# Patient Record
Sex: Female | Born: 1975 | Race: Black or African American | Hispanic: No | Smoking: Never smoker
Health system: Southern US, Community
[De-identification: ages and names within clinical notes are randomized; demographics above are authoritative.]

## PROBLEM LIST (undated history)

## (undated) DIAGNOSIS — F1010 Alcohol abuse, uncomplicated: Secondary | ICD-10-CM

## (undated) DIAGNOSIS — F419 Anxiety disorder, unspecified: Secondary | ICD-10-CM

## (undated) DIAGNOSIS — J45909 Unspecified asthma, uncomplicated: Secondary | ICD-10-CM

## (undated) DIAGNOSIS — K219 Gastro-esophageal reflux disease without esophagitis: Secondary | ICD-10-CM

## (undated) DIAGNOSIS — I10 Essential (primary) hypertension: Secondary | ICD-10-CM

## (undated) DIAGNOSIS — R011 Cardiac murmur, unspecified: Secondary | ICD-10-CM

## (undated) HISTORY — PX: ABDOMINAL HYSTERECTOMY: SHX81

## (undated) HISTORY — PX: MYRINGOTOMY WITH TUBE PLACEMENT: SHX5663

## (undated) HISTORY — PX: TONSILLECTOMY: SUR1361

---

## 1999-08-07 ENCOUNTER — Emergency Department (HOSPITAL_COMMUNITY): Admission: EM | Admit: 1999-08-07 | Discharge: 1999-08-07 | Payer: Self-pay | Admitting: Emergency Medicine

## 1999-08-11 ENCOUNTER — Emergency Department (HOSPITAL_COMMUNITY): Admission: EM | Admit: 1999-08-11 | Discharge: 1999-08-12 | Payer: Self-pay | Admitting: *Deleted

## 1999-08-14 ENCOUNTER — Emergency Department (HOSPITAL_COMMUNITY): Admission: EM | Admit: 1999-08-14 | Discharge: 1999-08-14 | Payer: Self-pay | Admitting: Emergency Medicine

## 1999-11-11 ENCOUNTER — Emergency Department (HOSPITAL_COMMUNITY): Admission: EM | Admit: 1999-11-11 | Discharge: 1999-11-11 | Payer: Self-pay | Admitting: Emergency Medicine

## 1999-11-20 ENCOUNTER — Emergency Department (HOSPITAL_COMMUNITY): Admission: EM | Admit: 1999-11-20 | Discharge: 1999-11-21 | Payer: Self-pay

## 1999-11-21 ENCOUNTER — Emergency Department (HOSPITAL_COMMUNITY): Admission: EM | Admit: 1999-11-21 | Discharge: 1999-11-21 | Payer: Self-pay | Admitting: Emergency Medicine

## 2000-04-11 ENCOUNTER — Emergency Department (HOSPITAL_COMMUNITY): Admission: EM | Admit: 2000-04-11 | Discharge: 2000-04-11 | Payer: Self-pay | Admitting: Emergency Medicine

## 2000-05-06 ENCOUNTER — Emergency Department (HOSPITAL_COMMUNITY): Admission: EM | Admit: 2000-05-06 | Discharge: 2000-05-06 | Payer: Self-pay | Admitting: Emergency Medicine

## 2000-05-06 ENCOUNTER — Encounter: Payer: Self-pay | Admitting: Emergency Medicine

## 2001-09-21 ENCOUNTER — Inpatient Hospital Stay (HOSPITAL_COMMUNITY): Admission: AD | Admit: 2001-09-21 | Discharge: 2001-09-21 | Payer: Self-pay | Admitting: *Deleted

## 2001-09-22 ENCOUNTER — Inpatient Hospital Stay (HOSPITAL_COMMUNITY): Admission: AD | Admit: 2001-09-22 | Discharge: 2001-09-24 | Payer: Self-pay | Admitting: Gynecology

## 2004-08-02 ENCOUNTER — Emergency Department (HOSPITAL_COMMUNITY): Admission: EM | Admit: 2004-08-02 | Discharge: 2004-08-02 | Payer: Self-pay | Admitting: Emergency Medicine

## 2005-11-23 ENCOUNTER — Inpatient Hospital Stay (HOSPITAL_COMMUNITY): Admission: AD | Admit: 2005-11-23 | Discharge: 2005-11-24 | Payer: Self-pay | Admitting: Obstetrics & Gynecology

## 2005-12-08 ENCOUNTER — Inpatient Hospital Stay (HOSPITAL_COMMUNITY): Admission: AD | Admit: 2005-12-08 | Discharge: 2005-12-09 | Payer: Self-pay | Admitting: Obstetrics & Gynecology

## 2006-09-07 ENCOUNTER — Emergency Department (HOSPITAL_COMMUNITY): Admission: EM | Admit: 2006-09-07 | Discharge: 2006-09-08 | Payer: Self-pay | Admitting: Emergency Medicine

## 2007-07-29 ENCOUNTER — Emergency Department: Payer: Self-pay | Admitting: Emergency Medicine

## 2007-10-26 ENCOUNTER — Other Ambulatory Visit: Payer: Self-pay

## 2007-10-26 ENCOUNTER — Emergency Department: Payer: Self-pay | Admitting: Emergency Medicine

## 2007-11-19 ENCOUNTER — Emergency Department: Payer: Self-pay | Admitting: Emergency Medicine

## 2007-12-11 ENCOUNTER — Emergency Department: Payer: Self-pay | Admitting: Emergency Medicine

## 2007-12-29 ENCOUNTER — Emergency Department: Payer: Self-pay

## 2008-04-27 ENCOUNTER — Emergency Department: Payer: Self-pay | Admitting: Emergency Medicine

## 2008-07-12 HISTORY — PX: TONSILECTOMY, ADENOIDECTOMY, BILATERAL MYRINGOTOMY AND TUBES: SHX2538

## 2008-07-24 ENCOUNTER — Emergency Department: Payer: Self-pay | Admitting: Unknown Physician Specialty

## 2008-11-13 ENCOUNTER — Ambulatory Visit: Payer: Self-pay | Admitting: Otolaryngology

## 2009-04-05 ENCOUNTER — Emergency Department (HOSPITAL_COMMUNITY): Admission: EM | Admit: 2009-04-05 | Discharge: 2009-04-05 | Payer: Self-pay | Admitting: Emergency Medicine

## 2009-04-21 ENCOUNTER — Encounter: Payer: Self-pay | Admitting: Obstetrics and Gynecology

## 2009-04-21 ENCOUNTER — Ambulatory Visit: Payer: Self-pay | Admitting: Obstetrics and Gynecology

## 2009-05-27 ENCOUNTER — Ambulatory Visit: Payer: Self-pay | Admitting: Family Medicine

## 2009-12-31 ENCOUNTER — Ambulatory Visit: Payer: Self-pay | Admitting: Obstetrics & Gynecology

## 2009-12-31 ENCOUNTER — Encounter (INDEPENDENT_AMBULATORY_CARE_PROVIDER_SITE_OTHER): Payer: Self-pay | Admitting: *Deleted

## 2009-12-31 LAB — CONVERTED CEMR LAB
Antibody Screen: NEGATIVE
Basophils Relative: 0 % (ref 0–1)
Eosinophils Absolute: 0.1 10*3/uL (ref 0.0–0.7)
HCT: 37.6 % (ref 36.0–46.0)
Hepatitis B Surface Ag: NEGATIVE
Lymphocytes Relative: 25 % (ref 12–46)
Monocytes Relative: 5 % (ref 3–12)
Neutro Abs: 6.3 10*3/uL (ref 1.7–7.7)
Neutrophils Relative %: 69 % (ref 43–77)
RBC: 4.16 M/uL (ref 3.87–5.11)
RDW: 13.9 % (ref 11.5–15.5)
Rh Type: POSITIVE
WBC: 9.2 10*3/uL (ref 4.0–10.5)

## 2010-01-21 ENCOUNTER — Ambulatory Visit (HOSPITAL_COMMUNITY): Admission: RE | Admit: 2010-01-21 | Discharge: 2010-01-21 | Payer: Self-pay | Admitting: Obstetrics & Gynecology

## 2010-01-27 ENCOUNTER — Ambulatory Visit: Payer: Self-pay | Admitting: Family Medicine

## 2010-02-20 ENCOUNTER — Ambulatory Visit (HOSPITAL_COMMUNITY): Admission: RE | Admit: 2010-02-20 | Discharge: 2010-02-20 | Payer: Self-pay | Admitting: Obstetrics & Gynecology

## 2010-03-17 ENCOUNTER — Encounter: Payer: Self-pay | Admitting: Family Medicine

## 2010-03-19 ENCOUNTER — Ambulatory Visit: Payer: Self-pay | Admitting: Obstetrics and Gynecology

## 2010-03-20 ENCOUNTER — Encounter: Payer: Self-pay | Admitting: Obstetrics and Gynecology

## 2010-03-20 LAB — CONVERTED CEMR LAB: WBC, Wet Prep HPF POC: NONE SEEN

## 2010-04-03 ENCOUNTER — Ambulatory Visit (HOSPITAL_COMMUNITY): Admission: RE | Admit: 2010-04-03 | Discharge: 2010-04-03 | Payer: Self-pay | Admitting: Obstetrics & Gynecology

## 2010-04-15 ENCOUNTER — Inpatient Hospital Stay (HOSPITAL_COMMUNITY): Admission: AD | Admit: 2010-04-15 | Discharge: 2010-04-15 | Payer: Self-pay | Admitting: Obstetrics & Gynecology

## 2010-04-15 ENCOUNTER — Ambulatory Visit: Payer: Self-pay | Admitting: Obstetrics and Gynecology

## 2010-04-15 ENCOUNTER — Ambulatory Visit: Payer: Self-pay | Admitting: Advanced Practice Midwife

## 2010-04-20 ENCOUNTER — Ambulatory Visit: Payer: Self-pay | Admitting: Cardiology

## 2010-04-21 ENCOUNTER — Ambulatory Visit: Payer: Self-pay | Admitting: Obstetrics & Gynecology

## 2010-04-28 ENCOUNTER — Ambulatory Visit: Payer: Self-pay

## 2010-04-28 ENCOUNTER — Ambulatory Visit: Payer: Self-pay | Admitting: Internal Medicine

## 2010-04-28 ENCOUNTER — Encounter: Payer: Self-pay | Admitting: Cardiology

## 2010-04-28 ENCOUNTER — Ambulatory Visit (HOSPITAL_COMMUNITY): Admission: RE | Admit: 2010-04-28 | Discharge: 2010-04-28 | Payer: Self-pay | Admitting: Cardiology

## 2010-05-11 ENCOUNTER — Ambulatory Visit: Payer: Self-pay | Admitting: Obstetrics and Gynecology

## 2010-05-25 ENCOUNTER — Ambulatory Visit: Payer: Self-pay | Admitting: Obstetrics and Gynecology

## 2010-06-02 ENCOUNTER — Ambulatory Visit (HOSPITAL_COMMUNITY)
Admission: RE | Admit: 2010-06-02 | Discharge: 2010-06-02 | Payer: Self-pay | Source: Home / Self Care | Admitting: Obstetrics & Gynecology

## 2010-06-18 ENCOUNTER — Ambulatory Visit: Payer: Self-pay | Admitting: Obstetrics & Gynecology

## 2010-06-18 ENCOUNTER — Encounter: Payer: Self-pay | Admitting: Obstetrics & Gynecology

## 2010-06-18 LAB — CONVERTED CEMR LAB
Hemoglobin: 10.7 g/dL — ABNORMAL LOW (ref 12.0–15.0)
Platelets: 192 10*3/uL (ref 150–400)
RBC: 3.47 M/uL — ABNORMAL LOW (ref 3.87–5.11)
WBC: 9.1 10*3/uL (ref 4.0–10.5)

## 2010-06-24 ENCOUNTER — Emergency Department (HOSPITAL_COMMUNITY)
Admission: EM | Admit: 2010-06-24 | Discharge: 2010-06-24 | Payer: Self-pay | Source: Home / Self Care | Admitting: Emergency Medicine

## 2010-06-24 ENCOUNTER — Ambulatory Visit: Payer: Self-pay | Admitting: Obstetrics & Gynecology

## 2010-06-26 ENCOUNTER — Inpatient Hospital Stay (HOSPITAL_COMMUNITY)
Admission: AD | Admit: 2010-06-26 | Discharge: 2010-06-26 | Payer: Self-pay | Source: Home / Self Care | Attending: Obstetrics & Gynecology | Admitting: Obstetrics & Gynecology

## 2010-06-29 ENCOUNTER — Ambulatory Visit: Payer: Self-pay | Admitting: Obstetrics and Gynecology

## 2010-06-30 ENCOUNTER — Ambulatory Visit (HOSPITAL_COMMUNITY): Admission: RE | Admit: 2010-06-30 | Payer: Self-pay | Source: Home / Self Care | Admitting: Obstetrics & Gynecology

## 2010-07-01 ENCOUNTER — Ambulatory Visit: Payer: Self-pay | Admitting: Obstetrics & Gynecology

## 2010-07-12 HISTORY — PX: ENDOMETRIAL ABLATION: SHX621

## 2010-07-16 ENCOUNTER — Ambulatory Visit
Admission: RE | Admit: 2010-07-16 | Discharge: 2010-07-16 | Payer: Self-pay | Source: Home / Self Care | Attending: Obstetrics & Gynecology | Admitting: Obstetrics & Gynecology

## 2010-07-21 ENCOUNTER — Ambulatory Visit (HOSPITAL_COMMUNITY)
Admission: RE | Admit: 2010-07-21 | Discharge: 2010-07-21 | Payer: Self-pay | Source: Home / Self Care | Attending: Family Medicine | Admitting: Family Medicine

## 2010-07-23 ENCOUNTER — Ambulatory Visit
Admission: RE | Admit: 2010-07-23 | Discharge: 2010-07-23 | Payer: Self-pay | Source: Home / Self Care | Attending: Obstetrics & Gynecology | Admitting: Obstetrics & Gynecology

## 2010-07-29 ENCOUNTER — Encounter: Payer: Self-pay | Admitting: Obstetrics & Gynecology

## 2010-07-29 ENCOUNTER — Ambulatory Visit
Admission: RE | Admit: 2010-07-29 | Discharge: 2010-07-29 | Payer: Self-pay | Source: Home / Self Care | Attending: Obstetrics & Gynecology | Admitting: Obstetrics & Gynecology

## 2010-08-01 ENCOUNTER — Encounter: Payer: Self-pay | Admitting: Obstetrics & Gynecology

## 2010-08-02 ENCOUNTER — Encounter: Payer: Self-pay | Admitting: Obstetrics & Gynecology

## 2010-08-06 ENCOUNTER — Ambulatory Visit
Admission: RE | Admit: 2010-08-06 | Discharge: 2010-08-06 | Payer: Self-pay | Source: Home / Self Care | Attending: Obstetrics & Gynecology | Admitting: Obstetrics & Gynecology

## 2010-08-12 ENCOUNTER — Encounter: Payer: Self-pay | Admitting: Obstetrics & Gynecology

## 2010-08-12 DIAGNOSIS — O30009 Twin pregnancy, unspecified number of placenta and unspecified number of amniotic sacs, unspecified trimester: Secondary | ICD-10-CM

## 2010-08-12 DIAGNOSIS — Z3480 Encounter for supervision of other normal pregnancy, unspecified trimester: Secondary | ICD-10-CM

## 2010-08-12 HISTORY — PX: TUBAL LIGATION: SHX77

## 2010-08-13 ENCOUNTER — Encounter (HOSPITAL_COMMUNITY)
Admission: RE | Admit: 2010-08-13 | Discharge: 2010-08-13 | Disposition: A | Discharge status: Home / Self Care | Payer: No Typology Code available for payment source | Source: Ambulatory Visit

## 2010-08-13 DIAGNOSIS — Z01812 Encounter for preprocedural laboratory examination: Secondary | ICD-10-CM | POA: Insufficient documentation

## 2010-08-13 LAB — CBC
MCH: 29 pg (ref 26.0–34.0)
MCHC: 32.7 g/dL (ref 30.0–36.0)
MCV: 88.5 fL (ref 78.0–100.0)
RBC: 3.83 MIL/uL — ABNORMAL LOW (ref 3.87–5.11)
WBC: 8.7 10*3/uL (ref 4.0–10.5)

## 2010-08-13 LAB — RPR: RPR Ser Ql: NONREACTIVE

## 2010-08-16 ENCOUNTER — Inpatient Hospital Stay (HOSPITAL_COMMUNITY)
Admission: AD | Admit: 2010-08-16 | Discharge: 2010-08-18 | DRG: 767 | Disposition: A | Discharge status: Home / Self Care | Payer: No Typology Code available for payment source | Source: Ambulatory Visit | Attending: Obstetrics & Gynecology | Admitting: Obstetrics & Gynecology

## 2010-08-16 ENCOUNTER — Inpatient Hospital Stay (HOSPITAL_COMMUNITY): Payer: No Typology Code available for payment source

## 2010-08-16 DIAGNOSIS — Z302 Encounter for sterilization: Secondary | ICD-10-CM

## 2010-08-16 DIAGNOSIS — O30009 Twin pregnancy, unspecified number of placenta and unspecified number of amniotic sacs, unspecified trimester: Secondary | ICD-10-CM

## 2010-08-16 DIAGNOSIS — O30049 Twin pregnancy, dichorionic/diamniotic, unspecified trimester: Secondary | ICD-10-CM

## 2010-08-16 DIAGNOSIS — D649 Anemia, unspecified: Secondary | ICD-10-CM

## 2010-08-16 DIAGNOSIS — O9903 Anemia complicating the puerperium: Secondary | ICD-10-CM | POA: Diagnosis not present

## 2010-08-16 DIAGNOSIS — Z01812 Encounter for preprocedural laboratory examination: Secondary | ICD-10-CM

## 2010-08-16 LAB — CBC
HCT: 33.5 % — ABNORMAL LOW (ref 36.0–46.0)
Hemoglobin: 11.7 g/dL — ABNORMAL LOW (ref 12.0–15.0)
MCHC: 34.9 g/dL (ref 30.0–36.0)
Platelets: 196 10*3/uL (ref 150–400)
WBC: 13.7 10*3/uL — ABNORMAL HIGH (ref 4.0–10.5)

## 2010-08-16 LAB — RPR: RPR Ser Ql: NONREACTIVE

## 2010-08-17 LAB — CBC
HCT: 28.6 % — ABNORMAL LOW (ref 36.0–46.0)
MCH: 29.3 pg (ref 26.0–34.0)
MCHC: 33.2 g/dL (ref 30.0–36.0)
MCV: 88.3 fL (ref 78.0–100.0)
RDW: 13.8 % (ref 11.5–15.5)
WBC: 9.9 10*3/uL (ref 4.0–10.5)

## 2010-08-18 ENCOUNTER — Encounter: Payer: Self-pay | Admitting: Obstetrics and Gynecology

## 2010-08-20 ENCOUNTER — Observation Stay (HOSPITAL_COMMUNITY)
Admission: RE | Admit: 2010-08-20 | Payer: No Typology Code available for payment source | Source: Ambulatory Visit | Admitting: Obstetrics & Gynecology

## 2010-08-24 NOTE — Op Note (Signed)
  Linda [name], SCHWAN              ACCOUNT NO.:  [id number]  MEDICAL RECORD NO.:  [id number]         PATIENT TYPE:  WINP  LOCATION:  101                           FACILITY:  [facility]  PHYSICIAN:  Tilda Burrow, M.D. DATE OF BIRTH:  07-27-1975  DATE OF PROCEDURE: DATE OF DISCHARGE:                              OPERATIVE REPORT   PREOPERATIVE DIAGNOSES:  Postpartum, desire for elective sterilization. POSTOPERATIVE DIAGNOSES:  Postpartum, desire for elective sterilization.  PROCEDURE:  Bilateral tubal sterilization with Filshie clips  SURGEON:  Tilda Burrow, M.D. ASSISTANT:  None. ANESTHESIA:  Epidural.  COMPLICATIONS:  None. FINDINGS:  Normal-appearing tubes bilaterally.  Normal ovaries.  ESTIMATED BLOOD LOSS:  Minimal. SPECIMEN:  None.  The patient to PACU in good condition. INDICATIONS:  A 35 year old multipara desiring permanent sterilization, acknowledging prenatal records, and confirmed prior to procedure, having delivered earlier today of twin infants 5 pounds 2 ounces and 5 pounds 0 ounces at approximately 9:00 a.m.  DETAILS OF PROCEDURE:  The patient was taken to the operating room, prepped and draped for lower abdominal surgery.  Foley catheter inserted and epidural topped up, good analgesic effect was confirmed.  An infraumbilical semicircular 2-cm skin incision was made with blunt dissection to the fascia which was grasped, elevated, and opened transversely.  Preperitoneal fat was opened bluntly and peritoneal cavity identified.  Moistened laparotomy tape was placed in the abdomen in such a way to elevate the bowel exposed.  The right fallopian tube could be identified along its entirety, the fimbria confirmed.  The Filshie clip was placed on midportion of the tube without difficulty. Post-application inspection showed that the tube was entrapped in the Filshie clip without difficulty.  The left tube was treated in a similar fashion, the laparotomy  tape removed, and peritoneal closure as follows.  The fascia was reapproximated using running 0 Vicryl, the umbilicus was reattached with some subcu 0 Vicryl and then subcuticular 4-0 Vicryl used to close the skin in continuous running fashion.  Sponge and needle counts were correct.  EBL minimal.  The patient to PACU in good condition.     Tilda Burrow, M.D.     JVF/MEDQ  D:  08/16/2010  T:  08/17/2010  Job:  [id number]  Electronically Signed by Christin Bach M.D. on 08/17/2010 09:37:28 PM

## 2010-08-25 ENCOUNTER — Inpatient Hospital Stay (HOSPITAL_COMMUNITY)
Admission: AD | Admit: 2010-08-25 | Discharge: 2010-08-25 | Disposition: A | Discharge status: Home / Self Care | Payer: No Typology Code available for payment source | Source: Ambulatory Visit | Attending: Obstetrics & Gynecology | Admitting: Obstetrics & Gynecology

## 2010-08-25 DIAGNOSIS — R51 Headache: Secondary | ICD-10-CM

## 2010-08-25 LAB — COMPREHENSIVE METABOLIC PANEL
Alkaline Phosphatase: 140 U/L — ABNORMAL HIGH (ref 39–117)
CO2: 25 mEq/L (ref 19–32)
Chloride: 100 mEq/L (ref 96–112)
Creatinine, Ser: 0.71 mg/dL (ref 0.4–1.2)
GFR calc non Af Amer: >60 mL/min (ref >60)
Potassium: 3.7 mEq/L (ref 3.5–5.1)
Sodium: 133 mEq/L — ABNORMAL LOW (ref 135–145)
Total Bilirubin: 0.7 mg/dL (ref 0.3–1.2)
Total Protein: 6.8 g/dL (ref 6.0–8.3)

## 2010-09-02 ENCOUNTER — Ambulatory Visit: Payer: No Typology Code available for payment source | Admitting: Obstetrics & Gynecology

## 2010-09-02 DIAGNOSIS — O135 Gestational [pregnancy-induced] hypertension without significant proteinuria, complicating the puerperium: Secondary | ICD-10-CM

## 2010-09-21 LAB — URINALYSIS, ROUTINE W REFLEX MICROSCOPIC
Glucose, UA: NEGATIVE mg/dL
Ketones, ur: 15 mg/dL — AB
Nitrite: NEGATIVE
Protein, ur: NEGATIVE mg/dL
Urobilinogen, UA: 0.2 mg/dL (ref 0.0–1.0)

## 2010-09-21 LAB — URINE CULTURE
Colony Count: 70000
Culture  Setup Time: 201112162109

## 2010-09-21 LAB — COMPREHENSIVE METABOLIC PANEL
ALT: 11 U/L (ref 0–35)
AST: 16 U/L (ref 0–37)
Albumin: 2.6 g/dL — ABNORMAL LOW (ref 3.5–5.2)
Alkaline Phosphatase: 110 U/L (ref 39–117)
Calcium: 8.2 mg/dL — ABNORMAL LOW (ref 8.4–10.5)
GFR calc Af Amer: >60 mL/min (ref >60)
Glucose, Bld: 82 mg/dL (ref 70–99)
Potassium: 3.5 mEq/L (ref 3.5–5.1)
Sodium: 136 mEq/L (ref 135–145)
Total Protein: 5.9 g/dL — ABNORMAL LOW (ref 6.0–8.3)

## 2010-09-21 LAB — CBC
HCT: 31.6 % — ABNORMAL LOW (ref 36.0–46.0)
MCHC: 33.9 g/dL (ref 30.0–36.0)
Platelets: 177 10*3/uL (ref 150–400)
RDW: 13.5 % (ref 11.5–15.5)
WBC: 10.6 10*3/uL — ABNORMAL HIGH (ref 4.0–10.5)

## 2010-09-23 LAB — URINALYSIS, ROUTINE W REFLEX MICROSCOPIC
Glucose, UA: NEGATIVE mg/dL
Hgb urine dipstick: NEGATIVE
Ketones, ur: NEGATIVE mg/dL
Protein, ur: NEGATIVE mg/dL
pH: 5.5 (ref 5.0–8.0)

## 2010-09-23 LAB — DIFFERENTIAL
Eosinophils Relative: 1 % (ref 0–5)
Lymphocytes Relative: 19 % (ref 12–46)
Monocytes Absolute: 0.6 10*3/uL (ref 0.1–1.0)
Monocytes Relative: 5 % (ref 3–12)
Neutro Abs: 8 10*3/uL — ABNORMAL HIGH (ref 1.7–7.7)

## 2010-09-23 LAB — CBC
HCT: 31.3 % — ABNORMAL LOW (ref 36.0–46.0)
Hemoglobin: 10.9 g/dL — ABNORMAL LOW (ref 12.0–15.0)
MCHC: 35 g/dL (ref 30.0–36.0)
MCV: 92.6 fL (ref 78.0–100.0)

## 2010-09-29 ENCOUNTER — Ambulatory Visit (HOSPITAL_COMMUNITY)
Admission: RE | Admit: 2010-09-29 | Discharge: 2010-09-29 | Disposition: A | Discharge status: Home / Self Care | Payer: No Typology Code available for payment source | Source: Ambulatory Visit | Attending: Obstetrics & Gynecology | Admitting: Obstetrics & Gynecology

## 2010-09-29 ENCOUNTER — Ambulatory Visit: Payer: No Typology Code available for payment source | Admitting: Obstetrics and Gynecology

## 2010-09-29 ENCOUNTER — Encounter: Payer: Self-pay | Admitting: Obstetrics & Gynecology

## 2010-09-29 ENCOUNTER — Encounter: Payer: No Typology Code available for payment source | Admitting: Obstetrics and Gynecology

## 2010-09-29 ENCOUNTER — Other Ambulatory Visit: Payer: Self-pay | Admitting: Obstetrics & Gynecology

## 2010-09-29 DIAGNOSIS — R58 Hemorrhage, not elsewhere classified: Secondary | ICD-10-CM

## 2010-09-29 LAB — CONVERTED CEMR LAB
HCT: 38.8 % (ref 36.0–46.0)
Hemoglobin: 12.4 g/dL (ref 12.0–15.0)
MCHC: 32 g/dL (ref 30.0–36.0)
Platelets: 301 10*3/uL (ref 150–400)
RDW: 13.9 % (ref 11.5–15.5)

## 2010-09-30 ENCOUNTER — Ambulatory Visit: Payer: No Typology Code available for payment source | Admitting: Obstetrics & Gynecology

## 2010-09-30 NOTE — Assessment & Plan Note (Signed)
Linda, [name]              ACCOUNT NO.:  [id number]  MEDICAL RECORD NO.:  [id number]           PATIENT TYPE:  O  LOCATION:  [facility] at [facility]          FACILITY:  [facility]  PHYSICIAN:  Jaynie Collins, MD     DATE OF BIRTH:  01/06/1976  DATE OF SERVICE:  09/29/2010                                 CLINIC NOTE  REASON FOR VISIT:  Postpartum appointment.  HISTORY OF PRESENT ILLNESS:  The patient is a 35 year old, gravida 4, para 3-0-1-4 who is status post a vaginal delivery of twin girl on August 16, 2010.  The patient also had a postpartum bilateral tubal ligation.  Since her delivery, the patient reports having persistent vaginal bleeding requiring the use of pads daily.  She did not tell us of the situation because she kept thinking that the bleeding was going to subside on its own, but it has not. This has been going on since delivery on August 16, 2010.  This bleeding is accompanied by some cramping.  She was put on some Loestrin which she said does not help with her bleeding .  At this point, the Loestrin was started early because the patient was bottle feeding.  She wants to know if there is any other evaluation that she can do for this bleeding.  As for her babies, her babies are doing very well.  She has a lot of support at home and denies any sad feelings.  She is bottle feeding.  She has had one episode of intercourse, but she felt that the cramping and the bleeding made it very uncomfortable.  She has no other concerns.  PHYSICAL EXAMINATION:  VITAL SIGNS:  Pulse 66, blood pressure 128/69, weight 205 pounds, height 5 feet 9 inches. GENERAL:  No apparent distress. ABDOMEN:  Soft.  No organomegaly.  The patient does have some suprapubic tenderness and especially on the right side.  No rebound.  No guarding. PELVIC:  Normal external female genitalia.  Intact perineum.  No lacerations seen on speculum examination.  The patient does have some red blood that is visible in  her vaginal vault and slow trickle coming from the cervix.  The patient was very tender on speculum examination. Gonorrhea and Chlamydia probe was obtained.  Bimanual exam revealed a normal-sized uterus.  No abnormal masses were palpated either in uterus or adnexa, but there was tenderness on bimanual examination.  ASSESSMENT/PLAN:  The patient is a 35 year old gravida 4, para 3-0-1-4 who is here for her postpartum check.  The patient does have persistent abnormal uterine bleeding as such we will check a CBC.  Quantitative HCG, and a TSH and follow up on the results and also get a pelvic ultrasound to rule out any structural anomalies or retained products of conception.  As for birth control, the patient was given a sample pack of Femcon Fe and told to take as directed for now we will continue to monitor her bleeding.  Bleeding precautions were reviewed, and we will follow up the results and manage accordingly.          ______________________________ Jaynie Collins, MD   UA/MEDQ  D:  09/29/2010  T:  09/30/2010  Job:  [id number]

## 2010-10-02 ENCOUNTER — Institutional Professional Consult (permissible substitution): Payer: No Typology Code available for payment source

## 2010-10-12 ENCOUNTER — Ambulatory Visit: Payer: No Typology Code available for payment source | Admitting: Obstetrics and Gynecology

## 2010-10-12 DIAGNOSIS — N938 Other specified abnormal uterine and vaginal bleeding: Secondary | ICD-10-CM

## 2010-10-12 DIAGNOSIS — F329 Major depressive disorder, single episode, unspecified: Secondary | ICD-10-CM

## 2010-10-12 DIAGNOSIS — N949 Unspecified condition associated with female genital organs and menstrual cycle: Secondary | ICD-10-CM

## 2010-10-12 DIAGNOSIS — F3289 Other specified depressive episodes: Secondary | ICD-10-CM

## 2010-11-09 ENCOUNTER — Ambulatory Visit (INDEPENDENT_AMBULATORY_CARE_PROVIDER_SITE_OTHER): Payer: No Typology Code available for payment source | Admitting: Obstetrics and Gynecology

## 2010-11-09 DIAGNOSIS — L732 Hidradenitis suppurativa: Secondary | ICD-10-CM

## 2010-11-09 DIAGNOSIS — G43109 Migraine with aura, not intractable, without status migrainosus: Secondary | ICD-10-CM

## 2010-11-09 DIAGNOSIS — N920 Excessive and frequent menstruation with regular cycle: Secondary | ICD-10-CM

## 2010-11-24 NOTE — Assessment & Plan Note (Signed)
Linda [name], GOVAN NO.:  [id number]   MEDICAL RECORD NO.:  [id number]          PATIENT TYPE:  POB   LOCATION:  [facility] at [facility]         FACILITY:  [facility]   PHYSICIAN:  Argentina Donovan, MD        DATE OF BIRTH:  Feb 19, 1976   DATE OF SERVICE:  04/21/2009                                  CLINIC NOTE   The patient's chief complaint is in for annual GYN and physical  examination.  The patient is a 35 year old, African American female,  gravida 3, para 2-0-1-2 with 2 normal spontaneous deliveries and then  had a miscarriage about 6 months ago.  She is still trying to get  pregnant again.  Has not been on vitamins and takes no other medication.  She has no physical or medical complaints, and has always been in a good  health.  She is with exception of her weight.   ALLERGIES:  None.   MEDICATIONS:  None.   REVIEW OF SYSTEMS:  Negative with exception of present illness.   PAST MEDICAL HISTORY:  Noncontributory.   FAMILY HISTORY:  Diabetes.  Father and mother with high blood pressure.   PHYSICAL EXAMINATION:  VITAL SIGNS:  She had a blood pressure of 127/96,  I will recheck that before discharge.  Her weight is 220 and she is 5  feet 9 inches tall with a pulse of 103.  GENERAL:  Well-developed, slightly obese, black female, in no acute  distress.  HEENT:  Within normal limits.  NECK:  Supple.  Thyroid symmetrical, no masses.  BREASTS:  Symmetrical, no dominant masses.  No nipple discharge.  No  supraclavicular or axillary nodes.  LUNGS:  Clear to auscultation and percussion.  HEART:  No murmur, normal sinus rhythm.  ABDOMEN:  Soft, flat, nontender.  No masses or organomegaly.  EXTERNAL GENITALIA:  Normal.  BUS within normal limits.  Vagina is clean  and well rugated.  Cervix is clean and parous.  Pap smear was taken.  The uterus is anterior with normal size, shape, and consistency.  The  adnexa could not be palpated because of the habitus of the patient.  RECTAL:  Negative.  EXTREMITIES:  No edema.  No varicosities.  NEUROLOGIC:  DTRs within normal limits.   IMPRESSION:  Borderline blood pressure, which will be rechecked before  the patient leaves.  The patient is attempting pregnancy, and we have  counseled her to go on vitamins with folic acid immediately and stay on  them all the time and she is not using contraception.           ______________________________  Argentina Donovan, MD     PR/MEDQ  D:  04/21/2009  T:  04/22/2009  Job:  [id number]

## 2010-11-27 NOTE — Discharge Summary (Signed)
[facility]  Patient:    Linda [name], Linda [name] Visit Number: [id number] MRN: [id number]          Service Type: OBS Location: 9300 [id number] Attending Physician:  Douglass Rivers Dictated by:   Antony Contras, [facility] Admit Date:  09/22/2001 Discharge Date: 09/24/2001                             Discharge Summary  DISCHARGE DIAGNOSES:          1. Intrauterine pregnancy at term.                               2. Spontaneous onset of labor.  PROCEDURES:                   Normal spontaneous vaginal delivery of a                               viable infant over intact perineum with repair                               of second degree laceration.  HISTORY OF PRESENT ILLNESS:   The patient is a 35 year old, Gravida 2, Para 0-0-1-0 with an LMP of February 03, 2001, [facility] September 29, 2001.  Prenatal course was complicated by history of asthma for which the patient was on albuterol and also HSV-2 history.  LABORATORY DATA:              Blood type A positive.  Antibody screen negative.  RPR, HBSG, and HIV nonreactive.  HOSPITAL COURSE AND TREATMENT:                    The patient presented at 38-2/7 weeks with spontaneous onset of labor.  She progressed to complete dilatation.  Delivered an Apgar 48 and 65 female infant weighing 7 pounds over intact perineum with repair of second degree laceration.  Infant did have an extra digit on both hands.  POSTPARTUM COURSE:            The patient remained afebrile. She had no difficulty voiding.  She was able to be discharged in satisfactory condition on her second postpartum.  Hemoglobin 10.5.  DISPOSITION:                  Follow up in the office in six weeks. Continue prenatal vitamins and iron, Motrin and Tylox for pain. Dictated by:   Antony Contras, [facility] Attending Physician:  Douglass Rivers DD:  10/09/01 TD:  10/09/01 Job: [id number]

## 2011-01-06 ENCOUNTER — Ambulatory Visit (INDEPENDENT_AMBULATORY_CARE_PROVIDER_SITE_OTHER): Payer: No Typology Code available for payment source | Admitting: Obstetrics & Gynecology

## 2011-01-06 DIAGNOSIS — N938 Other specified abnormal uterine and vaginal bleeding: Secondary | ICD-10-CM

## 2011-01-06 DIAGNOSIS — N949 Unspecified condition associated with female genital organs and menstrual cycle: Secondary | ICD-10-CM

## 2011-01-06 DIAGNOSIS — Z30430 Encounter for insertion of intrauterine contraceptive device: Secondary | ICD-10-CM

## 2011-01-08 NOTE — Assessment & Plan Note (Signed)
NAMETIFFINIE, Linda [name]              ACCOUNT NO.:  [id number]  MEDICAL RECORD NO.:  [id number]           PATIENT TYPE:  LOCATION:  [facility] at [facility]           FACILITY:  PHYSICIAN:  Jaynie Collins, MD     DATE OF BIRTH:  12/01/1975  DATE OF SERVICE:  01/06/2011                                 CLINIC NOTE  REASON FOR VISIT:  The patient is here for IUD insertion for irregular bleeding.  The patient is a 35 year old, gravida 4, para 3-0-1-4, who is status post vaginal delivery of twin girls in February 2012.  Since her delivery, she has had irregular bleeding.  She has had negative lab evaluation and also an x-ray, which was unremarkable with a thin endometrial stripe of 2-mm and no structural anomalies.  The patient was started on hormonal therapy.  She tried different OCPs, but the only thing that seems to control her bleeding was having two tablets of Demulen per day.  She says that she still have been having spotting every day and she now wants a Mirena IUD to be inserted for this and also further evaluation of her bleeding.  She denies any other gynecologic concerns.  Of note, a urine pregnancy test that was done today was negative.  The patient was counseled regarding needing an endometrial biopsy as part of evaluation for dysfunctional uterine bleeding as this is the only thing that has not been done yet and she agreed to this.  She also agreed to the Mirena IUD placement and the routine risks of irregular bleeding and cramping for the first 3-6 months were explained to the patient.  Written informed consent was obtained.  The patient was placed in a dorsal lithotomy position.  Vaginal speculum was placed.  Cervix was swabbed with Betadine.  A tenaculum was placed on the anterior lip of the cervix.  The 3-mm Pipelle was then introduced into the uterine cavity to a depth of 9 cm and used to obtain a moderate amount of endometrial tissue for the endometrial biopsy.  After  this, the Mirena IUD was placed without difficulty.  The strings of the Mirena were cut to about 3 cm outside the external os.  The patient tolerated the procedure well without any immediate complications.  The patient is to return to clinic in 4 weeks for IUD check and also review of endometrial biopsy results.  The patient will also be due for a Pap smear at that visit also and that will be done at this time.          ______________________________ Jaynie Collins, MD    UA/MEDQ  D:  01/06/2011  T:  01/07/2011  Job:  [id number]

## 2011-01-19 NOTE — Assessment & Plan Note (Signed)
Linda [name], DESHOTEL              ACCOUNT NO.:  [id number]  MEDICAL RECORD NO.:  [id number]           PATIENT TYPE:  LOCATION:  [facility] at [facility]           FACILITY:  PHYSICIAN:  Argentina Donovan, MD        DATE OF BIRTH:  1976-06-07  DATE OF SERVICE:  11/09/2010                                 CLINIC NOTE  The patient is a 35 year old gravida 4, para 3-0-1-4, who had vaginal delivery of twin girls in February of this year.  She is now about 12 weeks postpartum and still having problems with bleeding.  When last in, she saw Dr. Jolayne Panther after she had been on many different types birth control pills, put her on Demulen and told her to take them 3 for so many days, 2, etc.  When she got down to 1 pill, she started bleeding, and so she has been up on 2 for 2 weeks and the bleeding stopped.  So, we are going to continue her on that for another 2 weeks and then bring her back in.  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  She says she has no problems now as far as that goes.  So, we are going to continue her on that.  She has had severe migraine headaches, especially on the right side since the baby almost daily; and she has a history of them, but she had none during her pregnancy.  They do not seem to be any worse on the birth control pills, and they are when she is off it; and prior to the pregnancy, she was on Topamax with control.  One pill a day controlled her.  So, we are going to place on 25 mg of Toprol until we see her again once a day.  If that is not sufficient, then we will increase the dose of that.  In addition to this, she has developed hydroadenitis in both axillae after shaving and using antiperspirant.  I told her to stop the using the antiperspirants, to use some hot packs, putting on doxycycline for 2 weeks.  Told her if the abscess is developing larger, that they may have to be drained.  So come on  in, so we will get her to a surgeon before that time.  I am hoping that they should resolve.  IMPRESSION:  Dysfunctional uterine bleeding with normal ultrasound continued since the delivering, can only be controlled by Demulen twice a day.  Plan is to continue the Demulen twice a day.  Increased migraines previously controlled by Topamax.  Plan is to restart Topamax.  Hydroadenitis, axillae, bilateral.  Treat with doxycycline and moist heat and follow up in 2 weeks.          ______________________________ Argentina Donovan, MD    PR/MEDQ  D:  11/09/2010  T:  11/10/2010  Job:  [id number]

## 2011-02-09 ENCOUNTER — Encounter: Payer: Self-pay | Admitting: Family Medicine

## 2011-02-09 ENCOUNTER — Ambulatory Visit (INDEPENDENT_AMBULATORY_CARE_PROVIDER_SITE_OTHER): Payer: No Typology Code available for payment source | Admitting: Family Medicine

## 2011-02-09 VITALS — BP 122/85 | Wt 205.0 lb

## 2011-02-09 DIAGNOSIS — N711 Chronic inflammatory disease of uterus: Secondary | ICD-10-CM | POA: Insufficient documentation

## 2011-02-09 MED ORDER — FLUCONAZOLE 150 MG PO TABS: 150.0000 mg | ORAL_TABLET | Freq: Once | ORAL | Status: AC

## 2011-02-09 MED ORDER — DOXYCYCLINE HYCLATE 100 MG PO TABS: 100.0000 mg | ORAL_TABLET | Freq: Two times a day (BID) | ORAL | Status: AC

## 2011-02-09 MED ORDER — METRONIDAZOLE 500 MG PO TABS: 500.0000 mg | ORAL_TABLET | Freq: Two times a day (BID) | ORAL | Status: AC

## 2011-02-09 NOTE — Progress Notes (Signed)
  Subjective:    Patient ID: Linda [name], female    DOB: 1975/11/12, 35 y.o.   MRN: [id number]  HPI Comments: Has abnormal uterine bleeding and has had a big work-up, including an EMB which showed chronic endometritis.  She has had 2 wk course of doxy for underarm issue, prior to biopsy.  Has IUD and is still taking OC's and that stops her from bleeding.  She has pain also. Has breast tenderness.     Review of Systems  Constitutional: Negative for fever, chills and activity change.  Gastrointestinal: Negative for abdominal pain.  Genitourinary: Positive for vaginal discharge, menstrual problem and pelvic pain.       Objective:   Physical Exam  Constitutional: She appears well-developed and well-nourished.  HENT:  Head: Normocephalic.  Cardiovascular: Normal rate.   Pulmonary/Chest: Effort normal.  Abdominal: Soft.          Assessment & Plan:  Chronic endometritis-trial of 2 wks of Doxy and Flagyl Diflucan for yeast. F/u in 3 wks-consider D & C if needed for no improvement.

## 2011-03-04 ENCOUNTER — Ambulatory Visit: Payer: No Typology Code available for payment source | Admitting: Family Medicine

## 2011-03-22 ENCOUNTER — Other Ambulatory Visit: Payer: Self-pay | Admitting: Family Medicine

## 2011-03-22 MED ORDER — FLUCONAZOLE 150 MG PO TABS: 150.0000 mg | ORAL_TABLET | Freq: Every day | ORAL | Status: AC

## 2011-04-07 ENCOUNTER — Telehealth: Payer: Self-pay | Admitting: *Deleted

## 2011-04-07 ENCOUNTER — Other Ambulatory Visit: Payer: Self-pay | Admitting: Obstetrics & Gynecology

## 2011-04-07 DIAGNOSIS — N63 Unspecified lump in unspecified breast: Secondary | ICD-10-CM

## 2011-04-07 NOTE — Telephone Encounter (Signed)
Patient still has area of breast at 3:00 that she feels a lump.  She has a family history of breast cancer and ovarian cancer in her mother age 33.  Maternal aunt had breast cancer, brain cancer as well 7 when she passed, diagnosed at 44.  Pancreatic cancer maternal grandmother, Lung Cancer, mgrandfather.  Paternal grandmother throat cancer ( older).    Patient scheduled an appointment for tomorrow.  We will discuss BRAC testing at this time.

## 2011-04-08 ENCOUNTER — Encounter: Payer: Self-pay | Admitting: Obstetrics & Gynecology

## 2011-04-08 ENCOUNTER — Ambulatory Visit (INDEPENDENT_AMBULATORY_CARE_PROVIDER_SITE_OTHER): Payer: No Typology Code available for payment source | Admitting: Obstetrics & Gynecology

## 2011-04-08 VITALS — BP 133/91 | HR 65 | Ht 69.0 in | Wt 207.0 lb

## 2011-04-08 DIAGNOSIS — Z01818 Encounter for other preprocedural examination: Secondary | ICD-10-CM

## 2011-04-08 DIAGNOSIS — N920 Excessive and frequent menstruation with regular cycle: Secondary | ICD-10-CM | POA: Insufficient documentation

## 2011-04-08 MED ORDER — ETHYNODIOL DIAC-ETH ESTRADIOL 1-35 MG-MCG PO TABS: 1.0000 | ORAL_TABLET | Freq: Every day | ORAL | Status: DC

## 2011-04-08 NOTE — Progress Notes (Signed)
Patient is here today to discuss going forward with a D&C,  She has had the Mirena in for 4 Months now and is still having bleeding exactly like before.  No other concerns.  Discussed other management options for menorrhagia including oral Provera, Depo Provera, endometrial ablation (Novasure/Hydrothermal Ablation/Thermachoice) or hysterectomy as definitive surgical management.  Discussed risks and benefits of each method. Patient desires endometrial ablation with Novasure.  Risks of surgery were discussed with the patient including but not limited to: bleeding which may require transfusion; infection which may require antibiotics; injury to uterus leading to risk of injury to surrounding intraperitoneal organs, need for additional procedures including laparoscopy or laparotomy, and other postoperative/anesthesia complications. All questions were answered.  Novasure patient education pamphlet was given to patient.  She will be scheduled for diagnostic hysteroscopy, D&C, removal of Mirena IUD, Novasure endometrial ablation.  She was told that she will be contacted by our surgical scheduler regarding the time and date of her surgery; routine preoperative instructions of having nothing to eat or drink after midnight on the day prior to surgery and also coming to the hospital 1 1/2 hours prior to her time of surgery were also emphasized.  She was told she may be called for a preoperative appointment about a week prior to surgery and will be given further preoperative instructions at that visit.  In the meantime, patient will continue with Mirena and OCPs as prescribed.  Bleeding precautions reviewed.

## 2011-04-09 ENCOUNTER — Ambulatory Visit
Admission: RE | Admit: 2011-04-09 | Discharge: 2011-04-09 | Disposition: A | Discharge status: Home / Self Care | Payer: No Typology Code available for payment source | Source: Ambulatory Visit | Attending: Obstetrics & Gynecology | Admitting: Obstetrics & Gynecology

## 2011-04-09 DIAGNOSIS — N63 Unspecified lump in unspecified breast: Secondary | ICD-10-CM

## 2011-04-12 ENCOUNTER — Encounter: Payer: Self-pay | Admitting: *Deleted

## 2011-04-13 ENCOUNTER — Encounter: Payer: Self-pay | Admitting: Family Medicine

## 2011-04-13 ENCOUNTER — Encounter (HOSPITAL_COMMUNITY): Payer: Self-pay | Admitting: *Deleted

## 2011-04-13 ENCOUNTER — Ambulatory Visit (INDEPENDENT_AMBULATORY_CARE_PROVIDER_SITE_OTHER): Payer: No Typology Code available for payment source | Admitting: Family Medicine

## 2011-04-13 VITALS — BP 132/96 | HR 72 | Ht 69.0 in | Wt 207.0 lb

## 2011-04-13 DIAGNOSIS — Z124 Encounter for screening for malignant neoplasm of cervix: Secondary | ICD-10-CM

## 2011-04-13 DIAGNOSIS — Z23 Encounter for immunization: Secondary | ICD-10-CM

## 2011-04-13 MED ORDER — ALPRAZOLAM 0.5 MG PO TABS: 0.5000 mg | ORAL_TABLET | ORAL | Status: DC | PRN

## 2011-04-13 NOTE — Progress Notes (Signed)
  Subjective:    Patient ID: Linda [name], female    DOB: 01/20/1976, 35 y.o.   MRN: [id number]  HPI Surgery scheduled and needs pap--Last one was 01/2010 and was WNL. For Novasure secondary to chronic bleeding.   Review of Systems  Constitutional: Negative for activity change.  Respiratory: Negative for chest tightness and shortness of breath.   Cardiovascular: Negative for leg swelling.  Gastrointestinal: Negative for nausea, abdominal pain, diarrhea and constipation.  Genitourinary: Positive for vaginal bleeding and menstrual problem. Negative for dysuria, frequency, vaginal discharge and vaginal pain.  Neurological: Positive for headaches. Negative for seizures.       Objective:   Physical Exam  Genitourinary: Vagina normal and uterus normal. Cervix exhibits no motion tenderness and no discharge. Right adnexum displays no mass, no tenderness and no fullness. Left adnexum displays no mass, no tenderness and no fullness.       NEFG, BUS is WNL, IUD strings visualized.          Assessment & Plan:

## 2011-04-13 NOTE — Patient Instructions (Signed)
Endometrial Ablation Endometrial ablation removes the lining of the uterus (endometrium). It is usually a same day, outpatient treatment. Ablation helps avoid major surgery (such as a hysterectomy). A hysterectomy is removal of the cervix and uterus. Endometrial ablation has less risk and complications, has a shorter recovery period and is less expensive. After endometrial ablation, most women will have little or no menstrual bleeding. You may not keep your fertility. Pregnancy is no longer likely after this procedure but if you are pre-menopausal, you still need to use a reliable method of birth control following the procedure because pregnancy can occur. REASONS TO HAVE THE PROCEDURE MAY INCLUDE:  Heavy periods.   Bleeding that is causing anemia.   Anovulatory bleeding, very irregular, bleeding.   Bleeding submucous fibroids (on the lining inside the uterus) if they are smaller than 3 centimeters.  REASONS NOT TO HAVE THE PROCEDURE MAY INCLUDE:  You wish to have more children.   You have a pre-cancerous or cancerous problem. The cause of any abnormal bleeding must be diagnosed before having the procedure.   You have pain coming from the uterus.   You have a submucus fibroid larger than 3 centimeters.   You recently had a baby.   You recently had an infection in the uterus.   You have a severe retro-flexed, tipped uterus and cannot insert the instrument to do the ablation.   You had a Cesarean section or deep major surgery on the uterus.   The inner cavity of the uterus is too large for the endometrial ablation instrument.  BEFORE PROCEDURE:  The lining of the uterus must be tested to make sure there is no pre-cancerous or cancer cells present.   Medications may be given to make the lining of the uterus thinner.   Ultrasound may be used to evaluate the size and look for abnormalities of the uterus.   Future pregnancy is not desired.  RISKS AND COMPLICATIONS  Perforation of  the uterus.   Bleeding.   Infection of the uterus, bladder or vagina.   Injury to surrounding organs.   Cutting the cervix.   An air bubble to the lung (air embolus).   Pregnancy following the procedure.   Failure of the procedure to help the problem requiring hysterectomy.   Decreased ability to diagnose cancer in the lining of the uterus.  PROCEDURE There are different ways to destroy the lining of the uterus.   Resectoscope - radio frequency-alternating electric current is the most common one used.   Cryotherapy - freezing the lining of the uterus.   Heated Free Liquid - heated salt (saline) solution inserted into the uterus.   Microwave - uses high energy microwaves in the uterus.   Thermal Balloon - a catheter with a balloon tip is inserted into the uterus and filled with heated fluid.  Your caregiver will talk with you about the method used in this clinic. They will also instruct you on the pros and cons of the procedure. Endometrial ablation is performed along with a procedure called operative hysteroscopy. A narrow viewing tube is inserted through the birth canal (vagina) and through the cervix into the uterus. A tiny camera attached to the viewing tube (hysteroscope) allows the uterine cavity to be shown on a TV monitor during surgery. Your uterus is filled with a harmless liquid to make the procedure easier. The lining of the uterus is then removed. The lining can also be removed with a resectoscope which allows your surgeon to cut away  the lining of the uterus under direct vision. Usually, you will be able to go home within an hour after the procedure. HOME CARE INSTRUCTIONS  Do not drive for 24 hours.   No tampons, douching or intercourse for 2 weeks or until your caregiver approves.   Rest at home for 24 to 48 hours. You may then resume normal activities unless told differently by your caregiver.   Take your temperature two times a day for 4 days, and record it.    Take any medications your caregiver has ordered, as directed.   Use some form of contraception if you are pre-menopausal and do not want to get pregnant.  Bleeding after the procedure is normal. It varies from light spotting and mildly watery to bloody discharge for 4 to 6 weeks. You may also have mild cramping. Only take over-the-counter or prescription medicines for pain, discomfort, or fever as directed by your caregiver. Do not use aspirin, as this may aggravate bleeding. Frequent urination during the first 24 hours is normal. You will not know how effective your surgery is until at least 3 months after the surgery. SEEK IMMEDIATE MEDICAL CARE IF:  Bleeding is heavier than a normal menstrual cycle.   An oral temperature above 101 develops.   You have increasing cramps or pains not relieved with medication or develop belly (abdominal) pain which does not seem to be related to the same area of earlier cramping and pain.   You are light headed, weak or have fainting episodes.   You develop pain in the shoulder strap areas.   You have chest or leg pain.   You have abnormal vaginal discharge.   You have painful urination.  Document Released: 05/07/2004 Document Re-Released: 09/22/2009 [facility] Patient Information 2011 [place], [place].

## 2011-04-13 NOTE — Progress Notes (Signed)
Patient is here today for a pap.  She has surgery scheduled next week and needs this prior to having the surgery.  She would also like a refill of her Xanax.  She uses very sparingly but does like to have it just in case.

## 2011-04-13 NOTE — Progress Notes (Signed)
Addended by: Barbara Cower on: 04/13/2011 05:07 PM   Modules accepted: Orders, SmartSet

## 2011-04-14 ENCOUNTER — Telehealth: Payer: Self-pay

## 2011-04-14 NOTE — Telephone Encounter (Signed)
Dr. Macon Large I don't have the capability of writing a letter in the system.  I tried!  I need a letter for [employer] insurance to cover this.  The codes are 615.1 Chronic Endometritis  And 626.2 chronic bleeding.  The CPT code is [phone] according the [employer] dept and her surgery date is October 11, with the procedure being preformed by you.  If you would type this up and let me know when it is done so I can let them know at [employer].  Thanks!  Inetta Fermo

## 2011-04-14 NOTE — Telephone Encounter (Signed)
PAMELA FROM [employer] CALLED FROM ADMIN NEEDING THE DOCTOR TO SEND A FAXED FORM LETTER STATING THAT THE CPT 585.63 AND DIAGNOSIS CODE, LOCATION WHERE IT WILL TAKE PLACE DAY OF SERVICE AND PHYSICIAN PERFORMING IT. ALSO WHAT SHE IS HAVING DONE. IT NEEDS TO BE SENT TO HEALTH SERVICES FAX # [phone]. APPARENTLY HER INSURANCE REQUIRES IT. PLEASE GIVE PAMELA A CALL WHEN THIS IS TAKEN CARE OF AT [phone]

## 2011-04-15 ENCOUNTER — Encounter: Payer: Self-pay | Admitting: Obstetrics & Gynecology

## 2011-04-15 ENCOUNTER — Telehealth: Payer: Self-pay

## 2011-04-22 ENCOUNTER — Encounter (HOSPITAL_COMMUNITY): Payer: Self-pay | Admitting: Anesthesiology

## 2011-04-22 ENCOUNTER — Ambulatory Visit (HOSPITAL_COMMUNITY): Payer: PRIVATE HEALTH INSURANCE | Admitting: Anesthesiology

## 2011-04-22 ENCOUNTER — Encounter (HOSPITAL_COMMUNITY)
Admission: RE | Disposition: A | Discharge status: Home / Self Care | Payer: Self-pay | Source: Ambulatory Visit | Attending: Obstetrics & Gynecology

## 2011-04-22 ENCOUNTER — Other Ambulatory Visit: Payer: Self-pay | Admitting: Obstetrics & Gynecology

## 2011-04-22 ENCOUNTER — Ambulatory Visit (HOSPITAL_COMMUNITY)
Admission: RE | Admit: 2011-04-22 | Discharge: 2011-04-22 | Disposition: A | Discharge status: Home / Self Care | Payer: PRIVATE HEALTH INSURANCE | Source: Ambulatory Visit | Attending: Obstetrics & Gynecology | Admitting: Obstetrics & Gynecology

## 2011-04-22 DIAGNOSIS — N920 Excessive and frequent menstruation with regular cycle: Secondary | ICD-10-CM | POA: Insufficient documentation

## 2011-04-22 DIAGNOSIS — N711 Chronic inflammatory disease of uterus: Secondary | ICD-10-CM

## 2011-04-22 DIAGNOSIS — Z30432 Encounter for removal of intrauterine contraceptive device: Secondary | ICD-10-CM | POA: Insufficient documentation

## 2011-04-22 HISTORY — DX: Anxiety disorder, unspecified: F41.9

## 2011-04-22 LAB — CBC
HCT: 38.4 % (ref 36.0–46.0)
Hemoglobin: 13 g/dL (ref 12.0–15.0)
MCV: 88.3 fL (ref 78.0–100.0)
RDW: 12.8 % (ref 11.5–15.5)
WBC: 8 10*3/uL (ref 4.0–10.5)

## 2011-04-22 LAB — PREGNANCY, URINE: Preg Test, Ur: NEGATIVE

## 2011-04-22 LAB — SURGICAL PCR SCREEN: Staphylococcus aureus: NEGATIVE

## 2011-04-22 SURGERY — DILATATION & CURETTAGE/HYSTEROSCOPY WITH NOVASURE ABLATION
Anesthesia: Choice | Site: Uterus | Wound class: Clean Contaminated

## 2011-04-22 MED ORDER — KETOROLAC TROMETHAMINE 30 MG/ML IJ SOLN
INTRAMUSCULAR | Status: DC | PRN
  Administered 2011-04-22: 30 mg via INTRAVENOUS

## 2011-04-22 MED ORDER — MIDAZOLAM HCL 5 MG/5ML IJ SOLN
INTRAMUSCULAR | Status: DC | PRN
  Administered 2011-04-22: 2 mg via INTRAVENOUS

## 2011-04-22 MED ORDER — DEXAMETHASONE SODIUM PHOSPHATE 10 MG/ML IJ SOLN
INTRAMUSCULAR | Status: AC
  Filled 2011-04-22: qty 1

## 2011-04-22 MED ORDER — FENTANYL CITRATE 0.05 MG/ML IJ SOLN
25.0000 ug | INTRAMUSCULAR | Status: DC | PRN
  Administered 2011-04-22: 25 ug via INTRAVENOUS
  Administered 2011-04-22: 50 ug via INTRAVENOUS
  Administered 2011-04-22: 25 ug via INTRAVENOUS

## 2011-04-22 MED ORDER — KETOROLAC TROMETHAMINE 60 MG/2ML IM SOLN
INTRAMUSCULAR | Status: DC | PRN
  Administered 2011-04-22: 30 mg via INTRAMUSCULAR

## 2011-04-22 MED ORDER — PROPOFOL 10 MG/ML IV EMUL
INTRAVENOUS | Status: AC
  Filled 2011-04-22: qty 20

## 2011-04-22 MED ORDER — IBUPROFEN 600 MG PO TABS: 600.0000 mg | ORAL_TABLET | Freq: Four times a day (QID) | ORAL | Status: AC | PRN

## 2011-04-22 MED ORDER — ONDANSETRON HCL 4 MG/2ML IJ SOLN
INTRAMUSCULAR | Status: AC
  Filled 2011-04-22: qty 2

## 2011-04-22 MED ORDER — SCOPOLAMINE 1 MG/3DAYS TD PT72: 1.0000 | MEDICATED_PATCH | Freq: Once | TRANSDERMAL | Status: DC

## 2011-04-22 MED ORDER — DIPHENHYDRAMINE HCL 50 MG/ML IJ SOLN
INTRAMUSCULAR | Status: AC
  Administered 2011-04-22: 12.5 mg via INTRAVENOUS
  Filled 2011-04-22: qty 1

## 2011-04-22 MED ORDER — KETOROLAC TROMETHAMINE 30 MG/ML IJ SOLN: 15.0000 mg | Freq: Once | INTRAMUSCULAR | Status: DC | PRN

## 2011-04-22 MED ORDER — FENTANYL CITRATE 0.05 MG/ML IJ SOLN
INTRAMUSCULAR | Status: DC | PRN
  Administered 2011-04-22: 100 ug via INTRAVENOUS
  Administered 2011-04-22 (×2): 50 ug via INTRAVENOUS

## 2011-04-22 MED ORDER — DEXAMETHASONE SODIUM PHOSPHATE 4 MG/ML IJ SOLN
INTRAMUSCULAR | Status: DC | PRN
  Administered 2011-04-22: 10 mg via INTRAVENOUS

## 2011-04-22 MED ORDER — LIDOCAINE HCL (CARDIAC) 20 MG/ML IV SOLN
INTRAVENOUS | Status: DC | PRN
  Administered 2011-04-22: 100 mg via INTRAVENOUS

## 2011-04-22 MED ORDER — DIPHENHYDRAMINE HCL 50 MG/ML IJ SOLN
12.5000 mg | Freq: Once | INTRAMUSCULAR | Status: AC
  Administered 2011-04-22: 12.5 mg via INTRAVENOUS

## 2011-04-22 MED ORDER — OXYCODONE-ACETAMINOPHEN 5-325 MG PO TABS: 1.0000 | ORAL_TABLET | Freq: Four times a day (QID) | ORAL | Status: AC | PRN

## 2011-04-22 MED ORDER — FENTANYL CITRATE 0.05 MG/ML IJ SOLN
INTRAMUSCULAR | Status: AC
  Filled 2011-04-22: qty 4

## 2011-04-22 MED ORDER — LACTATED RINGERS IV SOLN
INTRAVENOUS | Status: DC
  Administered 2011-04-22 (×2): via INTRAVENOUS

## 2011-04-22 MED ORDER — GLYCOPYRROLATE 0.2 MG/ML IJ SOLN
INTRAMUSCULAR | Status: DC | PRN
  Administered 2011-04-22: 0.2 mg via INTRAVENOUS

## 2011-04-22 MED ORDER — FENTANYL CITRATE 0.05 MG/ML IJ SOLN
INTRAMUSCULAR | Status: AC
  Filled 2011-04-22: qty 2

## 2011-04-22 MED ORDER — SCOPOLAMINE 1 MG/3DAYS TD PT72
MEDICATED_PATCH | TRANSDERMAL | Status: AC
  Filled 2011-04-22: qty 1

## 2011-04-22 MED ORDER — GLYCOPYRROLATE 0.2 MG/ML IJ SOLN
INTRAMUSCULAR | Status: AC
  Filled 2011-04-22: qty 1

## 2011-04-22 MED ORDER — BUPIVACAINE HCL 0.5 % IJ SOLN
INTRAMUSCULAR | Status: DC | PRN
  Administered 2011-04-22: 30 mL

## 2011-04-22 MED ORDER — MUPIROCIN 2 % EX OINT
TOPICAL_OINTMENT | CUTANEOUS | Status: AC
  Administered 2011-04-22: 11:00:00 via NASAL
  Filled 2011-04-22: qty 22

## 2011-04-22 MED ORDER — ONDANSETRON HCL 4 MG/2ML IJ SOLN
INTRAMUSCULAR | Status: DC | PRN
  Administered 2011-04-22 (×2): 4 mg via INTRAVENOUS

## 2011-04-22 MED ORDER — MIDAZOLAM HCL 2 MG/2ML IJ SOLN
INTRAMUSCULAR | Status: AC
  Filled 2011-04-22: qty 2

## 2011-04-22 MED ORDER — DOCUSATE SODIUM 100 MG PO CAPS: 100.0000 mg | ORAL_CAPSULE | Freq: Two times a day (BID) | ORAL | Status: AC | PRN

## 2011-04-22 MED ORDER — LACTATED RINGERS IR SOLN
Status: DC | PRN
  Administered 2011-04-22: 3000 mL

## 2011-04-22 MED ORDER — KETOROLAC TROMETHAMINE 60 MG/2ML IM SOLN
INTRAMUSCULAR | Status: AC
  Filled 2011-04-22: qty 2

## 2011-04-22 MED ORDER — PROPOFOL 10 MG/ML IV EMUL
INTRAVENOUS | Status: DC | PRN
  Administered 2011-04-22: 200 mg via INTRAVENOUS

## 2011-04-22 MED ORDER — FENTANYL CITRATE 0.05 MG/ML IJ SOLN
INTRAMUSCULAR | Status: AC
  Administered 2011-04-22: 50 ug via INTRAVENOUS
  Filled 2011-04-22: qty 2

## 2011-04-22 MED ORDER — LIDOCAINE HCL (CARDIAC) 20 MG/ML IV SOLN
INTRAVENOUS | Status: AC
  Filled 2011-04-22: qty 5

## 2011-04-22 SURGICAL SUPPLY — 12 items
ABLATOR ENDOMETRIAL BIPOLAR (ABLATOR) ×2 IMPLANT
CATH ROBINSON RED A/P 16FR (CATHETERS) ×2 IMPLANT
CLOTH BEACON ORANGE TIMEOUT ST (SAFETY) ×2 IMPLANT
CONTAINER PREFILL 10% NBF 60ML (FORM) ×1 IMPLANT
GLOVE BIO SURGEON STRL SZ7 (GLOVE) ×2 IMPLANT
GLOVE BIOGEL PI IND STRL 7.0 (GLOVE) ×2 IMPLANT
GLOVE BIOGEL PI INDICATOR 7.0 (GLOVE) ×2
GOWN PREVENTION PLUS LG XLONG (DISPOSABLE) ×4 IMPLANT
GOWN STRL REIN XL XLG (GOWN DISPOSABLE) ×2 IMPLANT
PACK HYSTEROSCOPY LF (CUSTOM PROCEDURE TRAY) ×2 IMPLANT
TOWEL OR 17X24 6PK STRL BLUE (TOWEL DISPOSABLE) ×4 IMPLANT
WATER STERILE IRR 1000ML POUR (IV SOLUTION) ×2 IMPLANT

## 2011-04-22 NOTE — Anesthesia Postprocedure Evaluation (Signed)
Anesthesia Post Note  Patient: Linda [name]  Procedure(s) Performed:  DILATATION & CURETTAGE/HYSTEROSCOPY WITH NOVASURE ABLATION  Anesthesia type: General  Patient location: PACU  Post pain: Pain level controlled  Post assessment: Post-op Vital signs reviewed  Last Vitals:  Filed Vitals:   04/22/11 1330  BP: 129/88  Pulse: 102  Temp: 98.6 F (37 C)  Resp: 19    Post vital signs: Reviewed  Level of consciousness: sedated  Complications: No apparent anesthesia complications

## 2011-04-22 NOTE — Anesthesia Preprocedure Evaluation (Addendum)
Anesthesia Evaluation  Name, MR# and DOB Patient awake  General Assessment Comment  Reviewed: Allergy & Precautions, H&P , NPO status , Patient's Chart, lab work & pertinent test results, reviewed documented beta blocker date and time   Airway Mallampati: I TM Distance: >3 FB Neck ROM: full    Dental  (+) Teeth Intact   Pulmonary  clear to auscultation        Cardiovascular Exercise Tolerance: Good regular Normal    Neuro/Psych  Headaches (topamax for migraines - last migraine Sunday), PSYCHIATRIC DISORDERS (anxiety)    GI/Hepatic negative GI ROS Neg liver ROS    Endo/Other  Negative Endocrine ROS  Renal/GU negative Renal ROS  Genitourinary negative   Musculoskeletal   Abdominal   Peds  Hematology negative hematology ROS (+)   Anesthesia Other Findings   Reproductive/Obstetrics negative OB ROS                           Anesthesia Physical Anesthesia Plan  ASA: II  Anesthesia Plan: General   Post-op Pain Management:    Induction:   Airway Management Planned: Oral ETT  Additional Equipment:   Intra-op Plan:   Post-operative Plan:   Informed Consent: I have reviewed the patients History and Physical, chart, labs and discussed the procedure including the risks, benefits and alternatives for the proposed anesthesia with the patient or authorized representative who has indicated his/her understanding and acceptance.   Dental Advisory Given  Plan Discussed with: CRNA and Surgeon  Anesthesia Plan Comments:        Anesthesia Quick Evaluation

## 2011-04-22 NOTE — Anesthesia Procedure Notes (Signed)
Procedure Name: LMA Insertion Date/Time: 04/22/2011 11:13 AM Performed by: Karleen Dolphin Pre-anesthesia Checklist: Patient identified, Timeout performed, Emergency Drugs available, Suction available and Patient being monitored Patient Re-evaluated:Patient Re-evaluated prior to inductionOxygen Delivery Method: Circle System Utilized Preoxygenation: Pre-oxygenation with 100% oxygen Intubation Type: IV induction Ventilation: Mask ventilation without difficulty LMA: LMA inserted LMA Size: 4.0 Number of attempts: 1 Placement Confirmation: positive ETCO2 and breath sounds checked- equal and bilateral Tube secured with: Tape Dental Injury: Teeth and Oropharynx as per pre-operative assessment

## 2011-04-22 NOTE — Transfer of Care (Signed)
Immediate Anesthesia Transfer of Care Note  Patient: Linda [name]  Procedure(s) Performed:  DILATATION & CURETTAGE/HYSTEROSCOPY WITH NOVASURE ABLATION  Patient Location: PACU  Anesthesia Type: General  Level of Consciousness: awake, alert  and oriented  Airway & Oxygen Therapy: Patient Spontanous Breathing and Patient connected to nasal cannula oxygen  Post-op Assessment: Report given to PACU RN and Post -op Vital signs reviewed and stable  Post vital signs: Reviewed and stable  Complications: No apparent anesthesia complications

## 2011-04-22 NOTE — H&P (Signed)
Preoperative History and Physical  Linda [name] is a 35 y.o. with irregular bleeding, not alleviated by Mirena IUD.  No plans for future childbearing. Patient's last menstrual period was 11/23/2009.  Patient desires operative management.  Proposed surgery: Diagnostic hysteroscopy, D&C, removal of Mirena IUD, Novasure endometrial ablation.  PMH:    Past Medical History  Diagnosis Date  . Bleeding     POST PARTUM BLEEDING  . Migraine   . Anxiety     uses xanax prn-rx ran out   PSH:     Past Surgical History  Procedure Date  . Tubal ligation 08/2010  . Tonsilectomy, adenoidectomy, bilateral myringotomy and tubes 2010   SH:   History  Substance Use Topics  . Smoking status: Never Smoker   . Smokeless tobacco: Not on file  . Alcohol Use: 0.5 oz/week    1 drink(s) per week   FH:    Family History  Problem Relation Age of Onset  . Hypertension Father   . Diabetes Father   . Hypertension Mother   . Cancer Mother     HYSTERECTOMY  . Diabetes Maternal Aunt   . Diabetes Maternal Uncle   . Cancer Maternal Grandmother     PANCREATIC  . Hypertension Maternal Grandmother   . Diabetes Maternal Grandmother   . Cancer Maternal Grandfather     LUNG  . Hypertension Maternal Grandfather   . Diabetes Maternal Grandfather   . Cancer Paternal Grandmother     THROAT  . Alzheimer's disease Paternal Grandmother    Allergies: No Known Allergies  Medications:      Current facility-administered medications:lactated ringers infusion, , Intravenous, Continuous, Tereso Newcomer, MD, Last Rate: 125 mL/hr at 04/22/11 1043;  mupirocin (BACTROBAN) 2 % ointment, , , , ;  scopolamine (TRANSDERM-SCOP) 1.5 MG 1.5 mg, 1 patch, Transdermal, Once, Jiles Garter   PHYSICAL EXAM: Last menstrual period 11/23/2009. General: General appearance - alert, well appearing, and in no distress Chest - clear to auscultation, no wheezes, rales or rhonchi, symmetric air entry Heart - normal rate and regular  rhythm Abdomen - soft, nontender, nondistended, no masses or organomegaly Pelvic - examination not indicated Extremities - peripheral pulses normal, no pedal edema, no clubbing or cyanosis  Labs: Recent Results (from the past 336 hour(s))  CBC   Collection Time   04/22/11 10:09 AM      Component Value Range   WBC 8.0  4.0 - 10.5 (K/uL)   RBC 4.35  3.87 - 5.11 (MIL/uL)   Hemoglobin 13.0  12.0 - 15.0 (g/dL)   HCT 40.9  81.1 - 91.4 (%)   MCV 88.3  78.0 - 100.0 (fL)   MCH 29.9  26.0 - 34.0 (pg)   MCHC 33.9  30.0 - 36.0 (g/dL)   RDW 78.2  95.6 - 21.3 (%)   Platelets 278  150 - 400 (K/uL)   Imaging Studies: TRANSABDOMINAL AND TRANSVAGINAL ULTRASOUND OF PELVIS (10/05/10) Findings: The uterus has a normal size and echotexture, measuring 9.6 x 5.0 x 6.0 cm. The endometrial stripe is thin and homogeneous, measuring 2 mm in width. Both ovaries have a normal size and appearance. The right ovary measures 2.0 x 1.1 x 1.5 cm, and the left ovary measures 2.4 x 0.9 x 1.6 cm. There are no adnexal masses or free pelvic fluid.  IMPRESSION: Normal pelvic ultrasound.  US Breast Right 04/09/2011  *RADIOLOGY REPORT*  Clinical Data:  Mass and focal pain and tenderness felt by the patient in the lower inner  right breast.  She feels that the mass has resolved and the focal pain and tenderness are unchanged. Strong family history of breast cancer, including the patient's mother diagnosed at age 59.  DIGITAL DIAGNOSTIC BILATERAL MAMMOGRAM WITH CAD AND RIGHT BREAST ULTRASOUND:  Comparison:  None.  Findings:  Scattered fibroglandular tissue in both breasts with no mammographic findings suspicious for malignancy in either breast. Mammographic images were processed with CAD.  On physical exam, the patient is focally tender to palpation in the lower inner right breast with no discrete palpable mass.  Ultrasound is performed, showing normal appearing breast tissue throughout the lower inner right breast in the region of the  focal pain and tenderness.  IMPRESSION: No evidence of malignancy.  Annual screening mammography is recommended.  BI-RADS CATEGORY 1:  Negative.  Recommendation:  Bilateral screening mammogram in 1 year.  Original Report Authenticated By: Darrol Angel, M.D.   Assessment: Patient Active Problem List  Diagnoses  . Chronic endometritis  . Menorrhagia    Plan: Patient desires diagnostic hysteroscopy, D&C, removal of Mirena IUD, Novasure endometrial ablation. Risks of surgery were discussed with the patient including but not limited to: bleeding which may require transfusion; infection which may require antibiotics; injury to uterus leading to risk of injury to surrounding intraperitoneal organs, need for additional procedures including laparoscopy or laparotomy, and other postoperative/anesthesia complications. All questions answered; written informed consent obtained.  To OR when ready.  Chioma Mukherjee A 04/22/2011 10:43 AM

## 2011-04-22 NOTE — Op Note (Signed)
PREOPERATIVE DIAGNOSIS:  Irregular bleeding refractory to hormonal management POSTOPERATIVE DIAGNOSIS: The same  PROCEDURE: Removal of Mirena IUD, Hysteroscopy, Dilation and Curettage, NovaSure Endometrial Ablation  SURGEON: Dr. Jaynie Collins   INDICATIONS: 35 y.o.here for surgical management of irregular bleeding refractory to Mirena IUD and OCP treatment.  Risks of surgery were discussed with the patient including but not limited to: bleeding which may require transfusion; infection which may require antibiotics; injury to uterus leading to risk of injury to surrounding intraperitoneal organs, need for additional procedures including laparoscopy or laparotomy, and other postoperative/anesthesia complications. Written informed consent was obtained.   FINDINGS: A 10 week size anteverted/midline/retroverted uterus. IUD strings visualized and the IUD was successfully removed. Diffusely proliferative endometrium noted. Normal ostia bilaterally.   ANESTHESIA: General, paracervical block.  INTRAVENOUS FLUIDS: 1000 ml of LR  ESTIMATED BLOOD LOSS: 2 ml.  SPECIMENS: Endometrial curettings  COMPLICATIONS: None immediate.   PROCEDURE DETAILS: The patient was taken to the operating room where general anesthesia was administered and was found to be adequate. After an adequate timeout was performed, she was placed in the dorsal lithotomy position and examined; then prepped and draped in the sterile manner. Her bladder was catheterized for an unmeasured amount of clear, yellow urine. A speculum was then placed in the patient's vagina and a single tooth tenaculum was applied to the anterior lip of the cervix. A paracervical block using 0.5% Marcaine with epinephrine (30 ml) was administered.  The Mirena IUD strings were visualized and grasped with ring forceps; the IUD was removed in its entirety. The sound was used to obtain the cervical and uterine cavity length measurements; 3.5 cm and 5.5 cm respectively. The  cervix was dilated manually with Hegar dilators to accommodate the diagnostic hysteroscope. Once the cervix was dilated, the hysteroscope was inserted under direct visualization. The hysteroscope was also used to determine the level of the internal os, and measurements were confirmed. The uterine cavity was carefully examined, both ostia were recognized, and diffusely proliferative endometrium was noted. The hysteroscope was removed and the cervix was further dilated to accommodate the NovaSure device. The NovaSure device was inserted, and a cavity width of 3.2 cm was determined. Using a power of 97 watts, for 77 seconds, the endometrial ablation was performed. The hysteroscope was then re-introduced into the uterine cavity, confirming good ablation of the endometrium. The tenaculum was removed from the anterior lip of the cervix, and the vaginal speculum was removed after noting good hemostasis. The patient tolerated the procedure well and was taken to the recovery area awake, extubated and in stable condition.   Patient was discharged to home as per PACU criteria. Routine postoperative instructions given. Will follow up in clinic on November 13 at 1:30pm for postoperative evaluation at [facility].   Linda [name] A 04/22/2011 11:49 AM

## 2011-04-26 ENCOUNTER — Telehealth: Payer: Self-pay | Admitting: *Deleted

## 2011-04-26 DIAGNOSIS — Z9889 Other specified postprocedural states: Secondary | ICD-10-CM

## 2011-04-26 DIAGNOSIS — R112 Nausea with vomiting, unspecified: Secondary | ICD-10-CM

## 2011-04-26 MED ORDER — PROMETHAZINE HCL 25 MG PO TABS: 25.0000 mg | ORAL_TABLET | Freq: Four times a day (QID) | ORAL | Status: AC | PRN

## 2011-04-26 NOTE — Telephone Encounter (Addendum)
Pt left message stating that she had surgery on 04/22/11. The pain med is making her nauseous and she would like a Rx for this problem.  Patient called and told the medication has been e-prescribed (Promethazine) ANYANWU,UGONNA A 04/26/2011 5:32 PM

## 2011-05-14 ENCOUNTER — Telehealth: Payer: Self-pay | Admitting: *Deleted

## 2011-05-14 DIAGNOSIS — G8918 Other acute postprocedural pain: Secondary | ICD-10-CM

## 2011-05-14 MED ORDER — HYDROCODONE-ACETAMINOPHEN 7.5-750 MG PO TABS: 1.0000 | ORAL_TABLET | Freq: Four times a day (QID) | ORAL | Status: DC | PRN

## 2011-05-14 NOTE — Telephone Encounter (Signed)
Patient had endometrial ablation done and is now having significant cramping and she is still bleeding, not heavy but the cramping is worse.  She states it feels like her labor pains did when she had the twins.  We have moved her appointment up until Monday for follow up and she was advised she could go to [facility] if her bleeding worsened or changed.  I have also called in some Vicodin for her as Ibuprofen is not helping the cramping she is experiencing.

## 2011-05-20 ENCOUNTER — Encounter: Payer: Self-pay | Admitting: Obstetrics & Gynecology

## 2011-05-20 ENCOUNTER — Ambulatory Visit (INDEPENDENT_AMBULATORY_CARE_PROVIDER_SITE_OTHER): Payer: No Typology Code available for payment source | Admitting: Obstetrics & Gynecology

## 2011-05-20 VITALS — BP 137/93 | HR 71 | Wt 207.0 lb

## 2011-05-20 DIAGNOSIS — N949 Unspecified condition associated with female genital organs and menstrual cycle: Secondary | ICD-10-CM

## 2011-05-20 DIAGNOSIS — N938 Other specified abnormal uterine and vaginal bleeding: Secondary | ICD-10-CM

## 2011-05-20 DIAGNOSIS — Z9889 Other specified postprocedural states: Secondary | ICD-10-CM

## 2011-05-20 DIAGNOSIS — R32 Unspecified urinary incontinence: Secondary | ICD-10-CM

## 2011-05-20 NOTE — Progress Notes (Signed)
  Subjective:    Patient ID: Linda [name], female    DOB: 1975-10-29, 35 y.o.   MRN: [id number]  HPI  Linda [name] is status post d&c and Novasure ablation on 04-22-11 for refractory DUB.  She had used Mirena and OCPs without relief.  The u/s was normal as was her d&c specimen.  She comes in today crying with frustration that her bleeding has continued.  She says she can decrease the VB by using 3-4 OCPs per day.  She also says that since the surgery she has had severe cramping that requires 2-3 Vicodin per day to control. And since surgery she has been complaining of GSUI.  Review of Systems     Objective:   Physical Exam  No prolapse on exam NSSA, NT uterus, no adnexal masses     Assessment & Plan:  DUB- she is adament that "I want it all out". I will first check cvx cultures and TSH.  If they are normal and her DUB is still an issue, then I would agree to do a robotic hysterectomy. GSUI- check UA, UC&S

## 2011-05-24 NOTE — Progress Notes (Signed)
Addended by: Vinnie Langton C on: 05/24/2011 08:32 AM   Modules accepted: Orders

## 2011-05-25 ENCOUNTER — Encounter: Payer: No Typology Code available for payment source | Admitting: Obstetrics & Gynecology

## 2011-05-31 ENCOUNTER — Other Ambulatory Visit: Payer: No Typology Code available for payment source

## 2011-05-31 DIAGNOSIS — N939 Abnormal uterine and vaginal bleeding, unspecified: Secondary | ICD-10-CM

## 2011-06-01 LAB — GC/CHLAMYDIA PROBE AMP, URINE: Chlamydia, Swab/Urine, PCR: NEGATIVE

## 2011-06-02 LAB — URINE CULTURE
Colony Count: NO GROWTH
Organism ID, Bacteria: NO GROWTH

## 2011-06-28 ENCOUNTER — Encounter (HOSPITAL_COMMUNITY): Payer: Self-pay

## 2011-07-02 NOTE — Patient Instructions (Addendum)
   Your procedure is scheduled on: Monday, Dec 31st  Enter through the Main Entrance of [facility] at: 11:30am Pick up the phone at the desk and dial [phone] and inform us of your arrival.  Please call this number if you have any problems the morning of surgery: [phone]  Remember: Do not eat food after midnight:Sunday Do not drink clear liquids after:9am on Monday Take these medicines the morning of surgery with a SIP OF WATER:none  Do not wear jewelry, make-up, or FINGER nail polish Do not wear lotions, powders, perfumes or deodorant. Do not shave 48 hours prior to surgery. Do not bring valuables to the hospital.  Leave suitcase in the car. After Surgery it may be brought to your room. For patients being admitted to the hospital, checkout time is 11:00am the day of discharge.  Patients discharged on the day of surgery will not be allowed to drive home.     Remember to use your hibiclens as instructed.Please shower with 1/2 bottle the evening before your surgery and the other 1/2 bottle the morning of surgery.

## 2011-07-07 ENCOUNTER — Encounter (HOSPITAL_COMMUNITY)
Admission: RE | Admit: 2011-07-07 | Discharge: 2011-07-07 | Disposition: A | Discharge status: Home / Self Care | Payer: PRIVATE HEALTH INSURANCE | Source: Ambulatory Visit | Attending: Obstetrics & Gynecology | Admitting: Obstetrics & Gynecology

## 2011-07-07 ENCOUNTER — Encounter (HOSPITAL_COMMUNITY): Payer: Self-pay

## 2011-07-07 HISTORY — DX: Cardiac murmur, unspecified: R01.1

## 2011-07-07 LAB — SURGICAL PCR SCREEN: MRSA, PCR: NEGATIVE

## 2011-07-07 LAB — CBC
HCT: 37.5 % (ref 36.0–46.0)
Hemoglobin: 12.7 g/dL (ref 12.0–15.0)
MCH: 30 pg (ref 26.0–34.0)
MCHC: 33.9 g/dL (ref 30.0–36.0)

## 2011-07-12 ENCOUNTER — Encounter (HOSPITAL_COMMUNITY)
Admission: RE | Disposition: A | Discharge status: Home / Self Care | Payer: Self-pay | Source: Ambulatory Visit | Attending: Obstetrics & Gynecology

## 2011-07-12 ENCOUNTER — Other Ambulatory Visit: Payer: Self-pay | Admitting: Obstetrics & Gynecology

## 2011-07-12 ENCOUNTER — Ambulatory Visit (HOSPITAL_COMMUNITY): Payer: PRIVATE HEALTH INSURANCE | Admitting: Certified Registered Nurse Anesthetist

## 2011-07-12 ENCOUNTER — Ambulatory Visit (HOSPITAL_COMMUNITY)
Admission: RE | Admit: 2011-07-12 | Discharge: 2011-07-13 | Disposition: A | Discharge status: Home / Self Care | Payer: PRIVATE HEALTH INSURANCE | Source: Ambulatory Visit | Attending: Obstetrics & Gynecology | Admitting: Obstetrics & Gynecology

## 2011-07-12 ENCOUNTER — Encounter (HOSPITAL_COMMUNITY): Payer: Self-pay | Admitting: Certified Registered Nurse Anesthetist

## 2011-07-12 DIAGNOSIS — Z01812 Encounter for preprocedural laboratory examination: Secondary | ICD-10-CM

## 2011-07-12 DIAGNOSIS — N393 Stress incontinence (female) (male): Secondary | ICD-10-CM | POA: Insufficient documentation

## 2011-07-12 DIAGNOSIS — D259 Leiomyoma of uterus, unspecified: Secondary | ICD-10-CM | POA: Insufficient documentation

## 2011-07-12 DIAGNOSIS — N949 Unspecified condition associated with female genital organs and menstrual cycle: Secondary | ICD-10-CM | POA: Insufficient documentation

## 2011-07-12 DIAGNOSIS — N938 Other specified abnormal uterine and vaginal bleeding: Secondary | ICD-10-CM

## 2011-07-12 DIAGNOSIS — Z01818 Encounter for other preprocedural examination: Secondary | ICD-10-CM | POA: Insufficient documentation

## 2011-07-12 LAB — PREGNANCY, URINE: Preg Test, Ur: NEGATIVE

## 2011-07-12 SURGERY — ROBOTIC ASSISTED TOTAL HYSTERECTOMY
Anesthesia: General | Site: Abdomen | Wound class: Clean Contaminated

## 2011-07-12 MED ORDER — PROPOFOL 10 MG/ML IV EMUL
INTRAVENOUS | Status: DC | PRN
  Administered 2011-07-12: 200 mg via INTRAVENOUS

## 2011-07-12 MED ORDER — MIDAZOLAM HCL 2 MG/2ML IJ SOLN
INTRAMUSCULAR | Status: AC
  Filled 2011-07-12: qty 2

## 2011-07-12 MED ORDER — PROMETHAZINE HCL 25 MG/ML IJ SOLN
6.2500 mg | INTRAMUSCULAR | Status: DC | PRN
  Administered 2011-07-12: 12.5 mg via INTRAVENOUS

## 2011-07-12 MED ORDER — HYDROMORPHONE HCL PF 1 MG/ML IJ SOLN
0.2500 mg | INTRAMUSCULAR | Status: DC | PRN
  Administered 2011-07-12 (×2): 0.5 mg via INTRAVENOUS

## 2011-07-12 MED ORDER — KETOROLAC TROMETHAMINE 30 MG/ML IJ SOLN
INTRAMUSCULAR | Status: AC
  Filled 2011-07-12: qty 1

## 2011-07-12 MED ORDER — ONDANSETRON HCL 4 MG/2ML IJ SOLN
INTRAMUSCULAR | Status: DC | PRN
  Administered 2011-07-12: 2 mg via INTRAVENOUS
  Administered 2011-07-12: 4 mg via INTRAVENOUS

## 2011-07-12 MED ORDER — FENTANYL CITRATE 0.05 MG/ML IJ SOLN
INTRAMUSCULAR | Status: AC
  Filled 2011-07-12: qty 2

## 2011-07-12 MED ORDER — LIDOCAINE HCL (CARDIAC) 20 MG/ML IV SOLN
INTRAVENOUS | Status: DC | PRN
  Administered 2011-07-12: 100 mg via INTRAVENOUS

## 2011-07-12 MED ORDER — KETOROLAC TROMETHAMINE 30 MG/ML IJ SOLN
INTRAMUSCULAR | Status: DC | PRN
  Administered 2011-07-12: 30 mg via INTRAVENOUS

## 2011-07-12 MED ORDER — NEOSTIGMINE METHYLSULFATE 1 MG/ML IJ SOLN
INTRAMUSCULAR | Status: AC
  Filled 2011-07-12: qty 10

## 2011-07-12 MED ORDER — OXYCODONE HCL 5 MG PO TABS
5.0000 mg | ORAL_TABLET | ORAL | Status: DC | PRN
  Administered 2011-07-12 – 2011-07-13 (×3): 10 mg via ORAL
  Administered 2011-07-13 (×2): 5 mg via ORAL
  Administered 2011-07-13: 10 mg via ORAL
  Filled 2011-07-12: qty 1
  Filled 2011-07-12 (×4): qty 2
  Filled 2011-07-12: qty 1

## 2011-07-12 MED ORDER — CEFAZOLIN SODIUM 1-5 GM-% IV SOLN
INTRAVENOUS | Status: AC
  Filled 2011-07-12: qty 50

## 2011-07-12 MED ORDER — SUFENTANIL CITRATE 50 MCG/ML IV SOLN
INTRAVENOUS | Status: DC | PRN
  Administered 2011-07-12: 5 ug via INTRAVENOUS
  Administered 2011-07-12 (×2): 10 ug via INTRAVENOUS
  Administered 2011-07-12: 15 ug via INTRAVENOUS
  Administered 2011-07-12: 10 ug via INTRAVENOUS

## 2011-07-12 MED ORDER — DEXAMETHASONE SODIUM PHOSPHATE 10 MG/ML IJ SOLN
INTRAMUSCULAR | Status: AC
  Filled 2011-07-12: qty 1

## 2011-07-12 MED ORDER — ROPIVACAINE HCL 5 MG/ML IJ SOLN
INTRAMUSCULAR | Status: DC | PRN
  Administered 2011-07-12: 120 mL

## 2011-07-12 MED ORDER — LACTATED RINGERS IV SOLN
INTRAVENOUS | Status: DC
  Administered 2011-07-12: 17:00:00 via INTRAVENOUS
  Administered 2011-07-12: 125 mL/h via INTRAVENOUS
  Administered 2011-07-12: 15:00:00 via INTRAVENOUS

## 2011-07-12 MED ORDER — KETOROLAC TROMETHAMINE 30 MG/ML IJ SOLN: 15.0000 mg | Freq: Once | INTRAMUSCULAR | Status: DC | PRN

## 2011-07-12 MED ORDER — ARTIFICIAL TEARS OP OINT
TOPICAL_OINTMENT | OPHTHALMIC | Status: DC | PRN
  Administered 2011-07-12: 1 via OPHTHALMIC

## 2011-07-12 MED ORDER — GLYCOPYRROLATE 0.2 MG/ML IJ SOLN
INTRAMUSCULAR | Status: AC
  Filled 2011-07-12: qty 1

## 2011-07-12 MED ORDER — LACTATED RINGERS IR SOLN
Status: DC | PRN
  Administered 2011-07-12: 3000 mL

## 2011-07-12 MED ORDER — LIDOCAINE HCL (CARDIAC) 20 MG/ML IV SOLN
INTRAVENOUS | Status: AC
  Filled 2011-07-12: qty 5

## 2011-07-12 MED ORDER — INDIGOTINDISULFONATE SODIUM 8 MG/ML IJ SOLN
INTRAMUSCULAR | Status: DC | PRN
  Administered 2011-07-12: 5 mL via INTRAVENOUS

## 2011-07-12 MED ORDER — INDIGOTINDISULFONATE SODIUM 8 MG/ML IJ SOLN
INTRAMUSCULAR | Status: AC
  Filled 2011-07-12: qty 5

## 2011-07-12 MED ORDER — ACETAMINOPHEN 10 MG/ML IV SOLN
1000.0000 mg | Freq: Four times a day (QID) | INTRAVENOUS | Status: AC
  Administered 2011-07-12 (×4): 1000 mg via INTRAVENOUS
  Filled 2011-07-12 (×7): qty 100

## 2011-07-12 MED ORDER — ROCURONIUM BROMIDE 50 MG/5ML IV SOLN
INTRAVENOUS | Status: AC
  Filled 2011-07-12: qty 1

## 2011-07-12 MED ORDER — NEOSTIGMINE METHYLSULFATE 1 MG/ML IJ SOLN
INTRAMUSCULAR | Status: DC | PRN
  Administered 2011-07-12: 2 mg via INTRAVENOUS

## 2011-07-12 MED ORDER — GLYCOPYRROLATE 0.2 MG/ML IJ SOLN
INTRAMUSCULAR | Status: DC | PRN
  Administered 2011-07-12: .4 mg via INTRAVENOUS

## 2011-07-12 MED ORDER — ROCURONIUM BROMIDE 100 MG/10ML IV SOLN
INTRAVENOUS | Status: DC | PRN
  Administered 2011-07-12: 50 mg via INTRAVENOUS
  Administered 2011-07-12: 10 mg via INTRAVENOUS

## 2011-07-12 MED ORDER — FENTANYL CITRATE 0.05 MG/ML IJ SOLN
25.0000 ug | INTRAMUSCULAR | Status: DC | PRN
  Administered 2011-07-12 (×3): 50 ug via INTRAVENOUS

## 2011-07-12 MED ORDER — MIDAZOLAM HCL 5 MG/5ML IJ SOLN
INTRAMUSCULAR | Status: DC | PRN
  Administered 2011-07-12: 0.5 mg via INTRAVENOUS
  Administered 2011-07-12: 1.5 mg via INTRAVENOUS

## 2011-07-12 MED ORDER — DEXAMETHASONE SODIUM PHOSPHATE 4 MG/ML IJ SOLN
INTRAMUSCULAR | Status: DC | PRN
  Administered 2011-07-12 (×2): 5 mg via INTRAVENOUS

## 2011-07-12 MED ORDER — CEFAZOLIN SODIUM 1-5 GM-% IV SOLN
1.0000 g | INTRAVENOUS | Status: AC
  Administered 2011-07-12: 1 g via INTRAVENOUS

## 2011-07-12 MED ORDER — PROMETHAZINE HCL 25 MG/ML IJ SOLN
INTRAMUSCULAR | Status: AC
  Filled 2011-07-12: qty 1

## 2011-07-12 MED ORDER — STERILE WATER FOR IRRIGATION IR SOLN
Status: DC | PRN
  Administered 2011-07-12: 1000 mL

## 2011-07-12 MED ORDER — DEXTROSE IN LACTATED RINGERS 5 % IV SOLN
INTRAVENOUS | Status: DC
  Administered 2011-07-12: 13:00:00 via INTRAVENOUS

## 2011-07-12 MED ORDER — ACETAMINOPHEN 325 MG PO TABS: 325.0000 mg | ORAL_TABLET | ORAL | Status: DC | PRN

## 2011-07-12 MED ORDER — HYDROMORPHONE HCL PF 1 MG/ML IJ SOLN
INTRAMUSCULAR | Status: AC
  Filled 2011-07-12: qty 1

## 2011-07-12 MED ORDER — ONDANSETRON HCL 4 MG/2ML IJ SOLN
INTRAMUSCULAR | Status: AC
  Filled 2011-07-12: qty 2

## 2011-07-12 MED ORDER — SUFENTANIL CITRATE 50 MCG/ML IV SOLN
INTRAVENOUS | Status: AC
  Filled 2011-07-12: qty 1

## 2011-07-12 MED ORDER — PHENYLEPHRINE HCL 10 MG/ML IJ SOLN
INTRAMUSCULAR | Status: DC | PRN
  Administered 2011-07-12: 80 mg via INTRAVENOUS

## 2011-07-12 MED ORDER — PROPOFOL 10 MG/ML IV EMUL
INTRAVENOUS | Status: AC
  Filled 2011-07-12: qty 20

## 2011-07-12 MED ORDER — PHENYLEPHRINE 40 MCG/ML (10ML) SYRINGE FOR IV PUSH (FOR BLOOD PRESSURE SUPPORT)
PREFILLED_SYRINGE | INTRAVENOUS | Status: AC
  Filled 2011-07-12: qty 5

## 2011-07-12 SURGICAL SUPPLY — 70 items
ADH SKN CLS APL DERMABOND .7 (GAUZE/BANDAGES/DRESSINGS) ×3
APL SKNCLS STERI-STRIP NONHPOA (GAUZE/BANDAGES/DRESSINGS)
BAG URINE DRAINAGE (UROLOGICAL SUPPLIES) ×2 IMPLANT
BARRIER ADHS 3X4 INTERCEED (GAUZE/BANDAGES/DRESSINGS) ×1 IMPLANT
BENZOIN TINCTURE PRP APPL 2/3 (GAUZE/BANDAGES/DRESSINGS) ×1 IMPLANT
BLADE LAPAROSCOPIC MORCELL KIT (BLADE) IMPLANT
BLADELESS LONG 8MM (BLADE) ×2 IMPLANT
BRR ADH 4X3 ABS CNTRL BYND (GAUZE/BANDAGES/DRESSINGS)
CABLE HIGH FREQUENCY MONO STRZ (ELECTRODE) ×2 IMPLANT
CATH FOLEY 3WAY  5CC 16FR (CATHETERS) ×1
CATH FOLEY 3WAY 5CC 16FR (CATHETERS) ×1 IMPLANT
CHLORAPREP W/TINT 26ML (MISCELLANEOUS) ×2 IMPLANT
CLOTH BEACON ORANGE TIMEOUT ST (SAFETY) ×2 IMPLANT
CONT PATH 16OZ SNAP LID 3702 (MISCELLANEOUS) ×2 IMPLANT
COVER MAYO STAND STRL (DRAPES) ×2 IMPLANT
COVER TABLE BACK 60X90 (DRAPES) ×4 IMPLANT
COVER TIP SHEARS 8 DVNC (MISCELLANEOUS) ×1 IMPLANT
COVER TIP SHEARS 8MM DA VINCI (MISCELLANEOUS) ×1
DECANTER SPIKE VIAL GLASS SM (MISCELLANEOUS) ×2 IMPLANT
DERMABOND ADVANCED (GAUZE/BANDAGES/DRESSINGS) ×3
DERMABOND ADVANCED .7 DNX12 (GAUZE/BANDAGES/DRESSINGS) ×1 IMPLANT
DRAPE HUG U DISPOSABLE (DRAPE) ×2 IMPLANT
DRAPE HYSTEROSCOPY (DRAPE) ×2 IMPLANT
DRAPE LG THREE QUARTER DISP (DRAPES) ×4 IMPLANT
DRAPE MONITOR DA VINCI (DRAPE) IMPLANT
DRAPE WARM FLUID 44X44 (DRAPE) ×2 IMPLANT
ELECT REM PT RETURN 9FT ADLT (ELECTROSURGICAL) ×2
ELECTRODE REM PT RTRN 9FT ADLT (ELECTROSURGICAL) ×1 IMPLANT
EVACUATOR SMOKE 8.L (FILTER) ×2 IMPLANT
GAUZE VASELINE 3X9 (GAUZE/BANDAGES/DRESSINGS) IMPLANT
GLOVE BIO SURGEON STRL SZ 6.5 (GLOVE) ×6 IMPLANT
GLOVE ECLIPSE 6.5 STRL STRAW (GLOVE) ×6 IMPLANT
GOWN PREVENTION PLUS LG XLONG (DISPOSABLE) ×8 IMPLANT
KIT ACCESSORY DA VINCI DISP (KITS) ×1
KIT ACCESSORY DVNC DISP (KITS) ×1 IMPLANT
KIT DISP ACCESSORY 4 ARM (KITS) IMPLANT
MANIPULATOR UTERINE 4.5 ZUMI (MISCELLANEOUS) IMPLANT
NDL INSUFFLATION 14GA 120MM (NEEDLE) ×1 IMPLANT
NDL SPNL 18GX3.5 QUINCKE PK (NEEDLE) IMPLANT
NEEDLE INSUFFLATION 14GA 120MM (NEEDLE) ×2 IMPLANT
NEEDLE SPNL 18GX3.5 QUINCKE PK (NEEDLE) IMPLANT
NS IRRIG 1000ML POUR BTL (IV SOLUTION) ×6 IMPLANT
OCCLUDER COLPOPNEUMO (BALLOONS) ×4 IMPLANT
PACK LAVH (CUSTOM PROCEDURE TRAY) ×2 IMPLANT
PAD PREP 24X48 CUFFED NSTRL (MISCELLANEOUS) ×4 IMPLANT
PLUG CATH AND CAP STER (CATHETERS) ×2 IMPLANT
POSITIONER SURGICAL ARM (MISCELLANEOUS) ×4 IMPLANT
SET IRRIG TUBING LAPAROSCOPIC (IRRIGATION / IRRIGATOR) ×2 IMPLANT
SLING TVT EXACT (Sling) ×1 IMPLANT
SOLUTION ELECTROLUBE (MISCELLANEOUS) ×2 IMPLANT
SPONGE LAP 18X18 X RAY DECT (DISPOSABLE) IMPLANT
STRIP CLOSURE SKIN 1/2X4 (GAUZE/BANDAGES/DRESSINGS) ×1 IMPLANT
SUT VIC AB 0 CT1 27 (SUTURE)
SUT VIC AB 0 CT1 27XBRD ANTBC (SUTURE) IMPLANT
SUT VIC AB 2-0 CT2 27 (SUTURE) IMPLANT
SUT VICRYL 0 UR6 27IN ABS (SUTURE) ×4 IMPLANT
SUT VICRYL RAPIDE 4/0 PS 2 (SUTURE) ×4 IMPLANT
SYR 50ML LL SCALE MARK (SYRINGE) ×2 IMPLANT
SYSTEM CONVERTIBLE TROCAR (TROCAR) IMPLANT
TIP UTERINE 5.1X6CM LAV DISP (MISCELLANEOUS) IMPLANT
TIP UTERINE 6.7X10CM GRN DISP (MISCELLANEOUS) IMPLANT
TIP UTERINE 6.7X6CM WHT DISP (MISCELLANEOUS) IMPLANT
TIP UTERINE 6.7X8CM BLUE DISP (MISCELLANEOUS) ×1 IMPLANT
TOWEL OR 17X24 6PK STRL BLUE (TOWEL DISPOSABLE) ×4 IMPLANT
TROCAR DISP BLADELESS 8 DVNC (TROCAR) ×1 IMPLANT
TROCAR DISP BLADELESS 8MM (TROCAR) ×1
TROCAR XCEL 12X100 BLDLESS (ENDOMECHANICALS) ×2 IMPLANT
TROCAR Z-THREAD 12X150 (TROCAR) ×2 IMPLANT
TROCAR Z-THREAD BLADED 12X100M (TROCAR) IMPLANT
TUBING FILTER THERMOFLATOR (ELECTROSURGICAL) ×2 IMPLANT

## 2011-07-12 NOTE — Op Note (Addendum)
07/12/2011  3:37 PM  PATIENT:  Linda [name]  35 y.o. female  PRE-OPERATIVE DIAGNOSIS:  Uterine bleeding, dysfunctional, GSUI  POST-OPERATIVE DIAGNOSIS:  Uterine bleeding, dysfunctional, GSUI   PROCEDURE:  Procedure(s): ROBOTIC ASSISTED TOTAL HYSTERECTOMY TVT EXACT CYSTOSCOPY BILATERAL SALPINJECTOMY  SURGEON:  Nicholaus Bloom, MD PHYSICIAN ASSISTANT:   ASSISTANTS: Scheryl Darter, MD   ANESTHESIA:   general  EBL:  Total I/O In: 1050 [I.V.:1050] Out: 350 [Urine:200; Blood:150]  BLOOD ADMINISTERED:none  DRAINS: none   LOCAL MEDICATIONS USED:  OTHER 60 cc Ropivicaine  SPECIMEN:  Source of Specimen:  uterus, tubes  DISPOSITION OF SPECIMEN:  PATHOLOGY  COUNTS:  YES  TOURNIQUET:  * No tourniquets in log *  DICTATION: .Dragon Dictation  PLAN OF CARE: Admit for overnight observation  PATIENT DISPOSITION:  PACU - hemodynamically stable.   Delay start of Pharmacological VTE agent (>24hrs) due to surgical blood loss or risk of bleeding:  No  The risks, benefits, and alternatives of surgery were explained, understood, and accepted. Consents were signed. All questions were answered. She was taken to the operating room and general anesthesia was applied without complication. She was placed in the dorsal lithotomy position and her abdomen and vagina were prepped and draped after she had been carefully positioned on the table. A bimanual exam revealed a 6week size uterus that was mobile. Her adnexa were not enlarged. The cervix was measured and the uterus was sounded to 9 cm. A Rumi uterine manipulator was placed without difficulty. A Foley catheter was placed and it drained clear throughout the case. Gloves were changed and attention was turned to the abdomen. A vertical supra- umbilical incision was made and a Veress needle was placed intraperitoneally. CO2 was used to insufflate the abdomen to approximately 6 L. After good pneumoperitoneum was established, a 12 mm trocar was placed.  Laparoscopy confirmed correct placement. She was placed in Trendelenburg position and ports were placed in appropriate positions on her abdomen to allow maximum exposure during the robotic case. Specifically there was an 12 mm assistant port placed in the left lower quadrant under direct laproscopic visualization. A 8 mm port was placed about 10 cm lateral to the umbilical port on each side under direct laparoscopic visualization. Prior to all incisions, each site was injected with ropivicaine. The robot was attached to the ports and I proceeded with a robotic portion of the case. The pelvis was inspected and an and an alpha manner. 50 cc of Ropivicaine was placed in the pelvis. Her pelvis appeared normal, no evidence of endometriosis or adhesions. The tubes had a Filsche clip placed in the isthmic region. The ureters and the infundibulopelvic ligaments were identified. The utero ovarian ligament on each side was cauterized and cut. The PK/gyrus instrument was used for this portion the round ligaments were identified, cauterized and ligated.a bladder flap was created anteriorly. The uterine vessels were identified and cauterized and then cut.The bladder was pushed out of the operative site and an anterior colpotomy was made. The colpotomy incision was extended circumferentially, following the blue outline of the Rumi manipulator. All pedicles were hemostatic.The vaginal cuff was closed with 5 interrupted sutures, 0 Vicryl suture. Excellent hemostasis was noted throughout. The pelvis was irrigated. I was able to see the ureters functioning thoroughout the case. At this point, the assistant port was removed and the endofascial instrument was used to close the site with 0 vicryl. The robot was then undocked and the umbilical fascia was closed with a 0 vicryl figure of 8  suture. I then proceded with the TVT exact procedure. I hydrosected with dilute Ropivicaine. I made a small incision about 1.5 cm below the urethra. I  used the stylus to deviate the bladder out of the way while I placed the TVT Exact. At this point I performed cystoscopy. The cystoscopy revealed blue ejection from both ureters and no evidence of bladder injury. She was extubated and taken to recovery in stable condition.

## 2011-07-12 NOTE — Anesthesia Procedure Notes (Signed)
Procedure Name: Intubation Date/Time: 07/12/2011 12:58 PM Performed by: Harriett Rush, Anorah Trias ADEDAYO Pre-anesthesia Checklist: Patient identified, Patient being monitored, Emergency Drugs available, Timeout performed and Suction available Patient Re-evaluated:Patient Re-evaluated prior to inductionOxygen Delivery Method: Circle System Utilized Preoxygenation: Pre-oxygenation with 100% oxygen Intubation Type: IV induction Ventilation: Mask ventilation without difficulty Laryngoscope Size: Miller and 2 Grade View: Grade I Tube type: Oral Tube size: 7.0 mm Number of attempts: 1 Placement Confirmation: ETT inserted through vocal cords under direct vision,  breath sounds checked- equal and bilateral,  positive ETCO2 and CO2 detector Secured at: 20 cm Tube secured with: Tape Dental Injury: Teeth and Oropharynx as per pre-operative assessment

## 2011-07-12 NOTE — Anesthesia Postprocedure Evaluation (Signed)
Anesthesia Post Note  Patient: Linda [name]  Procedure(s) Performed:  ROBOTIC ASSISTED TOTAL HYSTERECTOMY - Bilateral Salpingectomy, Cystoscopy, Midurtheral Sling with TVT Exact  Anesthesia type: GA  Patient location: PACU  Post pain: Pain level controlled  Post assessment: Post-op Vital signs reviewed  Last Vitals:  Filed Vitals:   07/12/11 1600  BP:   Pulse:   Temp:   Resp: 14    Post vital signs: Reviewed  Level of consciousness: sedated  Complications: No apparent anesthesia complications

## 2011-07-12 NOTE — Transfer of Care (Signed)
Immediate Anesthesia Transfer of Care Note  Patient: Linda [name]  Procedure(s) Performed:  ROBOTIC ASSISTED TOTAL HYSTERECTOMY - Bilateral Salpingectomy, Cystoscopy, Midurtheral Sling with TVT Exact  Patient Location: PACU  Anesthesia Type: General  Level of Consciousness: oriented and sedated  Airway & Oxygen Therapy: Patient Spontanous Breathing and Patient connected to nasal cannula oxygen  Post-op Assessment: Report given to PACU RN and Post -op Vital signs reviewed and stable  Post vital signs: stable  Complications: No apparent anesthesia complications

## 2011-07-12 NOTE — Anesthesia Preprocedure Evaluation (Signed)
Anesthesia Evaluation  Patient identified by MRN, date of birth, ID band Patient awake    Reviewed: Allergy & Precautions, H&P , Patient's Chart, lab work & pertinent test results, reviewed documented beta blocker date and time   History of Anesthesia Complications Negative for: history of anesthetic complications  Airway Mallampati: II TM Distance: >3 FB Neck ROM: full    Dental No notable dental hx.    Pulmonary neg pulmonary ROS,  clear to auscultation  Pulmonary exam normal       Cardiovascular Exercise Tolerance: Good neg cardio ROS + Valvular Problems/Murmurs regular Normal    Neuro/Psych  Headaches, Negative Neurological ROS  Negative Psych ROS   GI/Hepatic negative GI ROS, Neg liver ROS,   Endo/Other  Negative Endocrine ROS  Renal/GU negative Renal ROS     Musculoskeletal   Abdominal   Peds  Hematology negative hematology ROS (+)   Anesthesia Other Findings   Reproductive/Obstetrics negative OB ROS                           Anesthesia Physical Anesthesia Plan  ASA: II  Anesthesia Plan: General ETT   Post-op Pain Management:    Induction:   Airway Management Planned:   Additional Equipment:   Intra-op Plan:   Post-operative Plan:   Informed Consent: I have reviewed the patients History and Physical, chart, labs and discussed the procedure including the risks, benefits and alternatives for the proposed anesthesia with the patient or authorized representative who has indicated his/her understanding and acceptance.   Dental Advisory Given  Plan Discussed with: CRNA and Surgeon  Anesthesia Plan Comments:         Anesthesia Quick Evaluation

## 2011-07-12 NOTE — H&P (Signed)
Linda [name] is an 35 y.o. female. She complains of DUB since delivery of her twins 2/12. She had an ablation/BTL in 10/12 but has continued to have DUB. She would like a hysterectomy. She also complains of GSUI, no UI, since vaginal delivery of her twins.  Pertinent Gynecological History: Menses: flow is moderate Bleeding: intermenstrual bleeding and dysfunctional uterine bleeding Contraception: BTL DES exposure: denies Blood transfusions: none Sexually transmitted diseases: no past history Previous GYN Procedures: DNC  Last mammogram: normal  Date: 9/12 Last pap: normal Date: 9/12 OB History: G4, P4 A1    Menstrual History: Menarche age: 32 Patient's last menstrual period was 07/12/2011.    Past Medical History  Diagnosis Date  . Bleeding     POST PARTUM BLEEDING  . Migraine   . Bronchitis with flu 06/12/11  . Heart murmur     while pregnant    Past Surgical History  Procedure Date  . Tubal ligation 08/2010  . Tonsilectomy, adenoidectomy, bilateral myringotomy and tubes 2010  . Endometrial ablation 2012    Family History  Problem Relation Age of Onset  . Hypertension Father   . Diabetes Father   . Hypertension Mother   . Cancer Mother     HYSTERECTOMY  . Diabetes Maternal Aunt   . Diabetes Maternal Uncle   . Cancer Maternal Grandmother     PANCREATIC  . Hypertension Maternal Grandmother   . Diabetes Maternal Grandmother   . Cancer Maternal Grandfather     LUNG  . Hypertension Maternal Grandfather   . Diabetes Maternal Grandfather   . Cancer Paternal Grandmother     THROAT  . Alzheimer's disease Paternal Grandmother     Social History:  reports that she has never smoked. She does not have any smokeless tobacco history on file. She reports that she does not drink alcohol or use illicit drugs.  Allergies:  Allergies  Allergen Reactions  . Relpax (Eletriptan Hydrobromide) Shortness Of Breath    And increased heart rate    Prescriptions prior to  admission  Medication Sig Dispense Refill  . ALPRAZolam (XANAX) 0.5 MG tablet Take 1 tablet (0.5 mg total) by mouth as needed.  30 tablet  2  . topiramate (TOPAMAX) 50 MG tablet Take 50 mg by mouth daily.       . vitamin C (ASCORBIC ACID) 500 MG tablet Take 500 mg by mouth daily.        Marland Kitchen HYDROcodone-acetaminophen (VICODIN ES) 7.5-750 MG per tablet Take 1 tablet by mouth every 6 (six) hours as needed for pain.  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 rate 16, height 5\' 9"  (1.753 m), weight 93.441 kg (206 lb), last menstrual period 07/12/2011, SpO2 100.00%. Physical Exam rrr with 2/6 SEM Lungs CTAB Abd- benign No uterine descensus  Results for orders placed during the hospital encounter of 07/12/11 (from the past 24 hour(s))  PREGNANCY, URINE     Status: Normal   Collection Time   07/12/11 12:00 PM      Component Value Range   Preg Test, Ur NEGATIVE      No results found.  Assessment/Plan: DUB- no response to ablation, OCPs, Mirena- I will do a RTLH with removal of tubes for ovarian cancer prevention. She understands the increased risk of cuff dehiscence and agrees to not have coitus for 2 months. She also  understands risks of surgery (infection, bleeding, damage to bowel, bladder, ureters), DVTs, etc.) GSUI- I will do a TVT exact. She understands the risk of possible urge incontinece post op as well as risk of damage to bladder.  Linda [name] C. 07/12/2011, 12:23 PM

## 2011-07-13 LAB — CBC
MCH: 29.9 pg (ref 26.0–34.0)
MCHC: 33.5 g/dL (ref 30.0–36.0)
Platelets: 265 10*3/uL (ref 150–400)
RBC: 3.71 MIL/uL — ABNORMAL LOW (ref 3.87–5.11)
RDW: 12.7 % (ref 11.5–15.5)

## 2011-07-13 MED ORDER — FLAVOXATE HCL 100 MG PO TABS: 100.0000 mg | ORAL_TABLET | Freq: Three times a day (TID) | ORAL | Status: DC | PRN

## 2011-07-13 MED ORDER — OXYCODONE HCL 5 MG PO TABS: 5.0000 mg | ORAL_TABLET | ORAL | Status: AC | PRN

## 2011-07-13 NOTE — Discharge Summary (Signed)
Physician Discharge Summary  Patient ID: Linda [name] MRN: [id number] DOB/AGE: 12-19-1975 36 y.o.  Admit date: 07/12/2011 Discharge date: 07/13/2011  Admission Diagnoses: DUB, GSUI  Discharge Diagnoses: same Active Problems:  * No active hospital problems. *    Discharged Condition: good  Hospital Course: She under went an uncomplicated robotic assisted laproscopic hysterectomy, bilateral salpinjectomy, TVT Exact mid urethral sling with normal EBL. She was eating without nausea or vomitting and having flatus by POD #1 but was unable to completely empty her bladder. Her PVR was 400 cc with 200 cc voided. A Foley catheter with leg bag was placed prior to discharge. She will remove it Thursday (in 48 hours) and will come to the office if she doesn't feel that she is emptying completely.  Consults: none  Significant Diagnostic Studies: labs: Her POD# hemoglobin was 11.7  Treatments: surgery: as above  Discharge Exam: Blood pressure 141/83, pulse 82, temperature 98.7 F (37.1 C), temperature source Oral, resp. rate 18, height 5\' 9"  (1.753 m), weight 93.441 kg (206 lb), last menstrual period 07/12/2011, SpO2 94.00%. General appearance: alert Resp: clear to auscultation bilaterally Cardio: regular rate and rhythm, S1, S2 normal, no murmur, click, rub or gallop GI: soft, non-tender; bowel sounds normal; no masses,  no organomegaly Incisions- healing great  Disposition: Home or Self Care   Medication List  As of 07/13/2011 12:54 PM   START taking these medications         flavoxATE 100 MG tablet   Commonly known as: URISPAS   Take 1 tablet (100 mg total) by mouth 3 (three) times daily as needed.      oxyCODONE 5 MG immediate release tablet   Commonly known as: Oxy IR/ROXICODONE   Take 1-2 tablets (5-10 mg total) by mouth every 4 (four) hours as needed.         CONTINUE taking these medications         ALPRAZolam 0.5 MG tablet   Commonly known as: XANAX   Take 1 tablet (0.5  mg total) by mouth as needed.      HYDROcodone-acetaminophen 7.5-750 MG per tablet   Commonly known as: VICODIN ES   Take 1 tablet by mouth every 6 (six) hours as needed for pain.      topiramate 50 MG tablet   Commonly known as: TOPAMAX      vitamin C 500 MG tablet   Commonly known as: ASCORBIC ACID          Where to get your medications    These are the prescriptions that you need to pick up. We sent them to a specific pharmacy, so you will need to go there to get them.   [facility] - [place], [place] - [address]    [address] [place]  [address]    Phone: [phone]        flavoxATE 100 MG tablet         You may get these medications from any pharmacy.         oxyCODONE 5 MG immediate release tablet           Follow-up Information    Call Allie Bossier., MD.   Contact information:   [address] [place] [address] [phone]          Signed: Allie Bossier 07/13/2011, 12:54 PM

## 2011-07-16 ENCOUNTER — Telehealth: Payer: Self-pay | Admitting: *Deleted

## 2011-07-16 DIAGNOSIS — G8918 Other acute postprocedural pain: Secondary | ICD-10-CM

## 2011-07-16 MED ORDER — HYDROCODONE-ACETAMINOPHEN 7.5-750 MG PO TABS: 1.0000 | ORAL_TABLET | Freq: Four times a day (QID) | ORAL | Status: DC | PRN

## 2011-07-16 NOTE — Telephone Encounter (Signed)
Pain medication given at hospital is not helping.  Patient has taken Vicodin in the past and done well with it.  Her pain is not worse, it is still the same.  She removed her catheter and has been voiding without problem.

## 2011-07-19 ENCOUNTER — Other Ambulatory Visit: Payer: Self-pay | Admitting: Obstetrics & Gynecology

## 2011-07-19 DIAGNOSIS — G8918 Other acute postprocedural pain: Secondary | ICD-10-CM

## 2011-07-19 MED ORDER — HYDROCODONE-ACETAMINOPHEN 7.5-750 MG PO TABS: 1.0000 | ORAL_TABLET | Freq: Four times a day (QID) | ORAL | Status: DC | PRN

## 2011-07-19 NOTE — Progress Notes (Signed)
  Subjective:    Patient ID: Linda [name], female    DOB: [birth year], 36 y.o.   MRN: [id number]  HPI  [name] called and requested a few more vicodin. She is able to void and denies fevers.  Review of Systems     Objective:   Physical Exam        Assessment & Plan:  Post op pain- I will give her 1 refill of her vicodin.

## 2011-07-30 ENCOUNTER — Telehealth: Payer: Self-pay | Admitting: *Deleted

## 2011-07-30 NOTE — Telephone Encounter (Signed)
Pt left message stating that she had surgery on 07/12/11- hysterectomy and bladder sling. She is having bright red bleeding and wants to know if she needs to come to the hospital or can wait until office [facility]) opens on [place]. I returned pt call and discussed her concern. She began having bleeding last evening which was heavy (1 episode).  She is wearing a sanitary pad today and the bleeding has slowed down somewhat but is still red. Pt has not changed her pad in approx 2-3 hrs. She is also experiencing abd pain and is taking ibuprofen 600mg  every 6hrs. She says the relief of pain and discomfort only lasts about 2 hrs. I advised the pt of the following. She should go to [facility] if her bleeding or pain increases or if she has any other concern about her condition. Meanwhile, she should continue taking the ibuprofen q6h and also may take the hydrocodone since she has a couple pills left. She should rest for today especially and the weekend if indicated. Pt voiced understanding. After completing call w/pt, I spoke w/Dr. Marice Potter. She stated that sometimes there will be some bleeding from the dissolvable stitches @ the vaginal cuff a couple weeks after surgery. She does not need to see the pt sooner than 2/13 as scheduled unless this bleeding is a result of having intercourse. I called pt back and left message for pt that I will call her again on Monday morning.

## 2011-08-02 NOTE — Telephone Encounter (Signed)
Spoke w/pt this morning. She states that her bleeding has stopped completely. I explained what Dr. Marice Potter had said on 1/18. Pt denies having intercourse and states that she was instructed to wait for 8 wks after surgery to have intercourse. I confirmed these instructions. Pt had no further c/o or questions.

## 2011-08-25 ENCOUNTER — Encounter: Payer: No Typology Code available for payment source | Admitting: Obstetrics & Gynecology

## 2011-09-23 ENCOUNTER — Telehealth: Payer: Self-pay | Admitting: *Deleted

## 2011-09-23 NOTE — Telephone Encounter (Signed)
Patient had a refill of her Diflucan and does not need [place] to call in anything in.  She has been on antibiotics for a tooth abscess.

## 2011-09-23 NOTE — Telephone Encounter (Signed)
Message copied by Barbara Cower on Thu Sep 23, 2011  2:31 PM ------      Message from: Reva Bores      Created: Thu Sep 23, 2011  8:49 AM       She sent a request for refill on Diflucan on 03/04 to the [facility]--Can you call and see if she still wants or needs this?

## 2011-10-13 ENCOUNTER — Other Ambulatory Visit: Payer: Self-pay | Admitting: Gynecology

## 2011-10-13 DIAGNOSIS — B9689 Other specified bacterial agents as the cause of diseases classified elsewhere: Secondary | ICD-10-CM

## 2011-10-13 MED ORDER — FLUCONAZOLE 150 MG PO TABS: 150.0000 mg | ORAL_TABLET | Freq: Once | ORAL | Status: AC

## 2011-11-11 ENCOUNTER — Other Ambulatory Visit: Payer: Self-pay | Admitting: Family Medicine

## 2011-11-15 ENCOUNTER — Telehealth: Payer: Self-pay

## 2011-11-15 MED ORDER — ALPRAZOLAM 0.5 MG PO TABS: 0.5000 mg | ORAL_TABLET | Freq: Every evening | ORAL | Status: DC | PRN

## 2011-11-15 MED ORDER — ALPRAZOLAM 0.5 MG PO TABS: 0.5000 mg | ORAL_TABLET | Freq: Three times a day (TID) | ORAL | Status: DC | PRN

## 2011-11-15 NOTE — Telephone Encounter (Signed)
30 tablets called into patients pharmacy.

## 2011-11-15 NOTE — Telephone Encounter (Signed)
Ok but it has to be called in--cannot send xanax electronically!

## 2011-11-15 NOTE — Telephone Encounter (Signed)
Addended by: Reva Bores on: 11/15/2011 10:51 AM   Modules accepted: Orders

## 2011-11-15 NOTE — Telephone Encounter (Signed)
Patient calling about her Xanax she is out doesn't use it often. Would like a refill if all possible. Sent to her Pharmacy, [facility] Ph: [phone]. She called on Thursday about it. Thanks!

## 2011-11-15 NOTE — Telephone Encounter (Signed)
Addended by: Barbara Cower on: 11/15/2011 02:02 PM   Modules accepted: Orders

## 2011-11-23 ENCOUNTER — Telehealth: Payer: Self-pay

## 2011-11-23 NOTE — Telephone Encounter (Signed)
PATIENT CALLED SHE HAS DEVELOPED A YEAST INFECTION DUE TO SOME ANTIBIOTICS AND NEEDS [place] TO CALL SOMETHING IN FOR HER. PER TINA I CALLED IN DYFLUCAN 150MG  #2 WITH 1 REFILL SINCE SHE GETS SINUS INFECTIONS OFTEN I CALLED IT IN TO HER PHARMACY [facility].

## 2011-12-08 ENCOUNTER — Emergency Department (HOSPITAL_COMMUNITY)
Admission: EM | Admit: 2011-12-08 | Discharge: 2011-12-08 | Disposition: A | Discharge status: Home / Self Care | Payer: Federal, State, Local not specified - PPO | Attending: Emergency Medicine | Admitting: Emergency Medicine

## 2011-12-08 ENCOUNTER — Encounter (HOSPITAL_COMMUNITY): Payer: Self-pay | Admitting: Family Medicine

## 2011-12-08 DIAGNOSIS — R600 Localized edema: Secondary | ICD-10-CM

## 2011-12-08 DIAGNOSIS — R609 Edema, unspecified: Secondary | ICD-10-CM | POA: Insufficient documentation

## 2011-12-08 MED ORDER — PREDNISONE 10 MG PO TABS: 20.0000 mg | ORAL_TABLET | Freq: Every day | ORAL | Status: DC

## 2011-12-08 MED ORDER — DEXAMETHASONE SODIUM PHOSPHATE 10 MG/ML IJ SOLN
10.0000 mg | Freq: Once | INTRAMUSCULAR | Status: AC
  Administered 2011-12-08: 10 mg via INTRAMUSCULAR
  Filled 2011-12-08: qty 1

## 2011-12-08 MED ORDER — FUROSEMIDE 20 MG PO TABS: 20.0000 mg | ORAL_TABLET | Freq: Every day | ORAL | Status: DC

## 2011-12-08 MED ORDER — FUROSEMIDE 40 MG PO TABS
40.0000 mg | ORAL_TABLET | Freq: Once | ORAL | Status: AC
  Administered 2011-12-08: 40 mg via ORAL
  Filled 2011-12-08: qty 1

## 2011-12-08 NOTE — ED Notes (Signed)
Pt reports she was sent over here by urgent care to get a possible ultrasound. States left ankle has been swelling starting Sunday. Denies any injury. Reports swelling and pain has increased over time. Able to ambulate but with pain.  CMS intact

## 2011-12-08 NOTE — ED Provider Notes (Signed)
History     CSN: [id number]  Arrival date & time 12/08/11  1859   First MD Initiated Contact with Patient 12/08/11 2101      Chief Complaint  Patient presents with  . Joint Swelling    (Consider location/radiation/quality/duration/timing/severity/associated sxs/prior treatment) The history is provided by the patient.   Patient here with bilateral lower extremity edema worse on the left side. Denies any calf or thigh tenderness on the left side. Denies any pleuritic chest pain or shortness of breath. Was seen at urgent care Center for further evaluation. History of lower extremity edema associated with a recent pregnancy 3 months ago and had been on diuretics for this. Past Medical History  Diagnosis Date  . Bleeding     POST PARTUM BLEEDING  . Migraine   . Bronchitis with flu 06/12/11  . Heart murmur     while pregnant    Past Surgical History  Procedure Date  . Tubal ligation 08/2010  . Tonsilectomy, adenoidectomy, bilateral myringotomy and tubes 2010  . Endometrial ablation 2012    Family History  Problem Relation Age of Onset  . Hypertension Father   . Diabetes Father   . Hypertension Mother   . Cancer Mother     HYSTERECTOMY  . Diabetes Maternal Aunt   . Diabetes Maternal Uncle   . Cancer Maternal Grandmother     PANCREATIC  . Hypertension Maternal Grandmother   . Diabetes Maternal Grandmother   . Cancer Maternal Grandfather     LUNG  . Hypertension Maternal Grandfather   . Diabetes Maternal Grandfather   . Cancer Paternal Grandmother     THROAT  . Alzheimer's disease Paternal Grandmother     History  Substance Use Topics  . Smoking status: Never Smoker   . Smokeless tobacco: Not on file  . Alcohol Use: No    OB History    Grav Para Term Preterm Abortions TAB SAB Ect Mult Living                  Review of Systems  All other systems reviewed and are negative.    Allergies  Relpax  Home Medications   Current Outpatient Rx  Name Route  Sig Dispense Refill  . IBUPROFEN 200 MG PO TABS Oral Take 800 mg by mouth every 6 (six) hours as needed. Swelling      BP 142/82  Pulse 83  Temp(Src) 98.6 F (37 C) (Oral)  Resp 12  Wt 226 lb 4 oz (102.626 kg)  SpO2 100%  LMP 09/06/2010  Physical Exam  Nursing note and vitals reviewed. Constitutional: She is oriented to person, place, and time. She appears well-developed and well-nourished.  Non-toxic appearance. No distress.  HENT:  Head: Normocephalic and atraumatic.  Eyes: Conjunctivae, EOM and lids are normal. Pupils are equal, round, and reactive to light.  Neck: Normal range of motion. Neck supple. No tracheal deviation present. No mass present.  Cardiovascular: Normal rate, regular rhythm and normal heart sounds.  Exam reveals no gallop.   No murmur heard. Pulmonary/Chest: Effort normal and breath sounds normal. No stridor. No respiratory distress. She has no decreased breath sounds. She has no wheezes. She has no rhonchi. She has no rales.  Abdominal: Soft. Normal appearance and bowel sounds are normal. She exhibits no distension. There is no tenderness. There is no rebound and no CVA tenderness.  Musculoskeletal: Normal range of motion. She exhibits no edema and no tenderness.       Bilateral 1+ edema  worse on the left ankle. No warmth to the joint on the left side. No calf tenderness or thigh tenderness of the left or right leg. Dorsalis pedis Pulse palpable left and right side. No rashes noted  Neurological: She is alert and oriented to person, place, and time. She has normal strength. No cranial nerve deficit or sensory deficit. GCS eye subscore is 4. GCS verbal subscore is 5. GCS motor subscore is 6.  Skin: Skin is warm and dry. No abrasion and no rash noted.  Psychiatric: She has a normal mood and affect. Her speech is normal and behavior is normal.    ED Course  Procedures (including critical care time)  Labs Reviewed - No data to display No results found.   No  diagnosis found.    MDM  No concern for dvt--suspect reactive arthritis, will give lasix and decadron and have pt return in 48 hrs for a recheck        Toy Baker, MD 12/08/11 2133

## 2011-12-08 NOTE — Discharge Instructions (Signed)
Return here on Friday at 4 PM for a recheck. To the staff and the fact that you were here to see Dr. Freida Busman. Return at once for shortness of breath, chest pain, fever, or increased swelling Peripheral Edema You have swelling in your legs (peripheral edema). This swelling is due to excess accumulation of salt and water in your body. Edema may be a sign of heart, kidney or liver disease, or a side effect of a medication. It may also be due to problems in the leg veins. Elevating your legs and using special support stockings may be very helpful, if the cause of the swelling is due to poor venous circulation. Avoid long periods of standing, whatever the cause. Treatment of edema depends on identifying the cause. Chips, pretzels, pickles and other salty foods should be avoided. Restricting salt in your diet is almost always needed. Water pills (diuretics) are often used to remove the excess salt and water from your body via urine. These medicines prevent the kidney from reabsorbing sodium. This increases urine flow. Diuretic treatment may also result in lowering of potassium levels in your body. Potassium supplements may be needed if you have to use diuretics daily. Daily weights can help you keep track of your progress in clearing your edema. You should call your caregiver for follow up care as recommended. SEEK IMMEDIATE MEDICAL CARE IF:   You have increased swelling, pain, redness, or heat in your legs.   You develop shortness of breath, especially when lying down.   You develop chest or abdominal pain, weakness, or fainting.   You have a fever.  Document Released: 08/05/2004 Document Revised: 06/17/2011 Document Reviewed: 07/16/2009 [facility] Patient Information 2012 [place], [place].

## 2011-12-27 ENCOUNTER — Other Ambulatory Visit: Payer: Self-pay | Admitting: *Deleted

## 2011-12-27 DIAGNOSIS — G43909 Migraine, unspecified, not intractable, without status migrainosus: Secondary | ICD-10-CM

## 2011-12-27 NOTE — Telephone Encounter (Signed)
Received fax from [facility] for refill for butalb-acet-caff- prescribed by Dr. Okey Dupre- patient now seen at [facility]- sent there

## 2011-12-31 ENCOUNTER — Encounter: Payer: Self-pay | Admitting: Family Medicine

## 2011-12-31 ENCOUNTER — Ambulatory Visit: Payer: No Typology Code available for payment source | Admitting: Nurse Practitioner

## 2012-01-12 DIAGNOSIS — G43909 Migraine, unspecified, not intractable, without status migrainosus: Secondary | ICD-10-CM | POA: Insufficient documentation

## 2012-01-12 MED ORDER — BUTALBITAL-APAP-CAFFEINE 50-500-40 MG PO TABS: 1.0000 | ORAL_TABLET | ORAL | Status: DC | PRN

## 2012-01-12 NOTE — Telephone Encounter (Signed)
Med refill still in inbox, called [facility]- informed to send to Dr. Macon Large and Laurence Compton, RN since is [facility] Patient

## 2012-01-12 NOTE — Telephone Encounter (Signed)
Please call in prescription for patient; order placed

## 2012-01-12 NOTE — Addendum Note (Signed)
Addended by: Jaynie Collins A on: 01/12/2012 03:03 PM   Modules accepted: Orders

## 2012-01-17 NOTE — Telephone Encounter (Signed)
Medication has been called into pharmacy as directed by Dr. Macon Large.

## 2012-01-20 ENCOUNTER — Telehealth: Payer: Self-pay | Admitting: *Deleted

## 2012-01-20 DIAGNOSIS — G43909 Migraine, unspecified, not intractable, without status migrainosus: Secondary | ICD-10-CM

## 2012-01-20 MED ORDER — BUTALBITAL-APAP-CAFFEINE 50-500-40 MG PO TABS: 1.0000 | ORAL_TABLET | ORAL | Status: AC | PRN

## 2012-01-20 MED ORDER — TOPIRAMATE 50 MG PO TABS: 50.0000 mg | ORAL_TABLET | Freq: Two times a day (BID) | ORAL | Status: AC

## 2012-01-20 NOTE — Telephone Encounter (Signed)
Patient would like to get refills of headache meds, she will make appointment for physical by October when she is due for her yearly.

## 2012-04-28 ENCOUNTER — Other Ambulatory Visit: Payer: Self-pay | Admitting: Obstetrics & Gynecology

## 2012-04-28 ENCOUNTER — Ambulatory Visit: Payer: No Typology Code available for payment source | Admitting: Obstetrics & Gynecology

## 2012-04-28 DIAGNOSIS — Z1231 Encounter for screening mammogram for malignant neoplasm of breast: Secondary | ICD-10-CM

## 2012-05-01 ENCOUNTER — Ambulatory Visit: Payer: No Typology Code available for payment source | Admitting: Obstetrics & Gynecology

## 2012-05-16 ENCOUNTER — Ambulatory Visit: Payer: No Typology Code available for payment source

## 2012-06-26 ENCOUNTER — Other Ambulatory Visit: Payer: Self-pay | Admitting: *Deleted

## 2012-07-04 ENCOUNTER — Ambulatory Visit: Payer: No Typology Code available for payment source

## 2012-07-21 ENCOUNTER — Ambulatory Visit: Payer: No Typology Code available for payment source

## 2012-12-30 IMAGING — US US TRANSVAGINAL NON-OB
1 series · 14 of 25 positions shown · non-contrast
Comparison: None.

CLINICAL DATA: Postpartum bleeding; twins delivered on [birth year] 16, 2010



[Series 1: us pelvis complete · 14 of 52 slices shown]
[im 1/52]
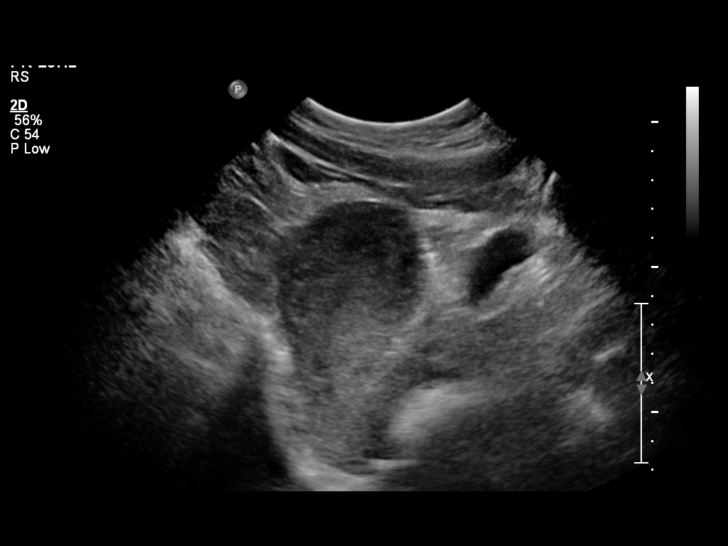
[im 5/52]
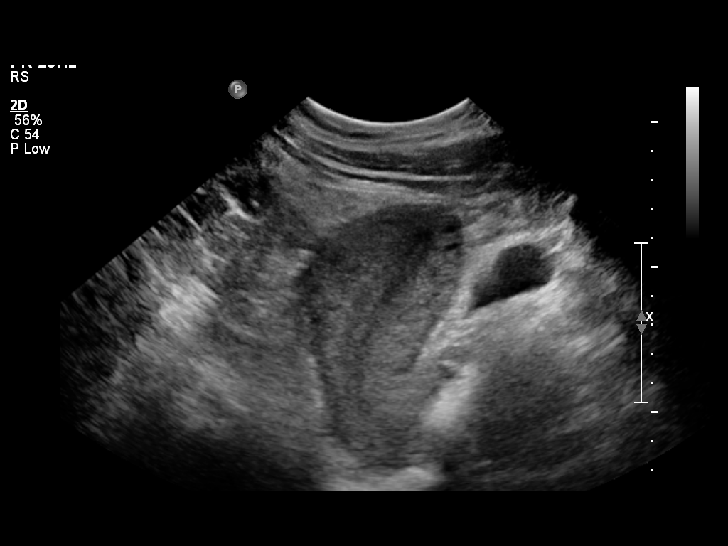
[im 9/52]
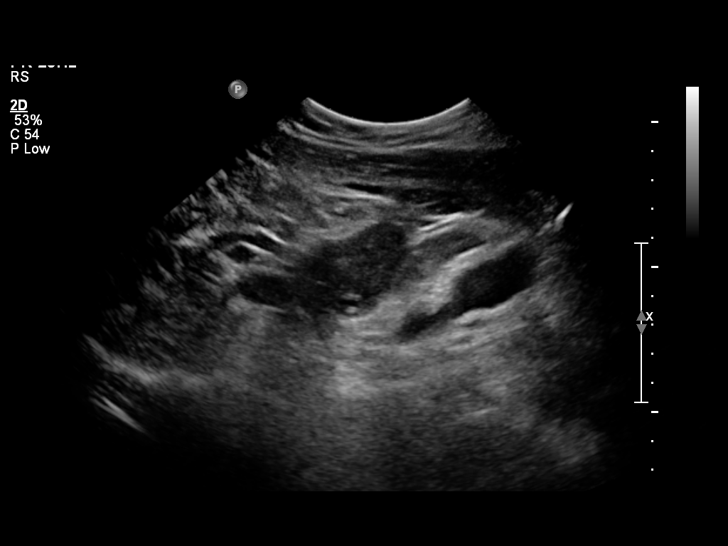
[im 13/52]
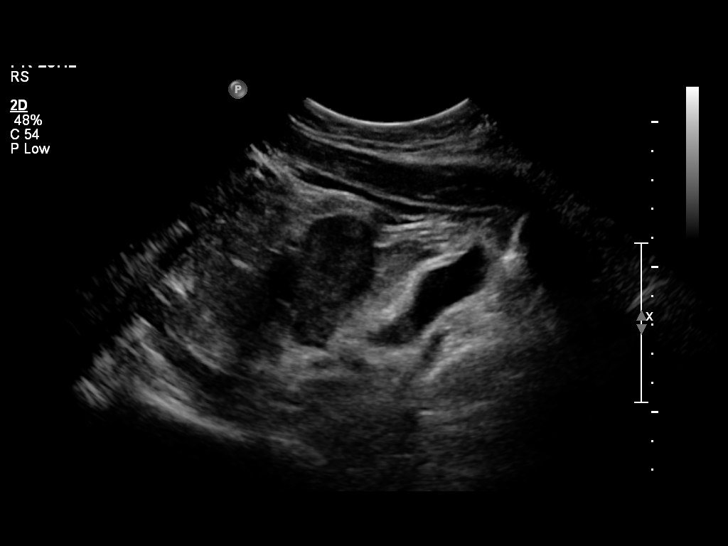
[im 18/52]
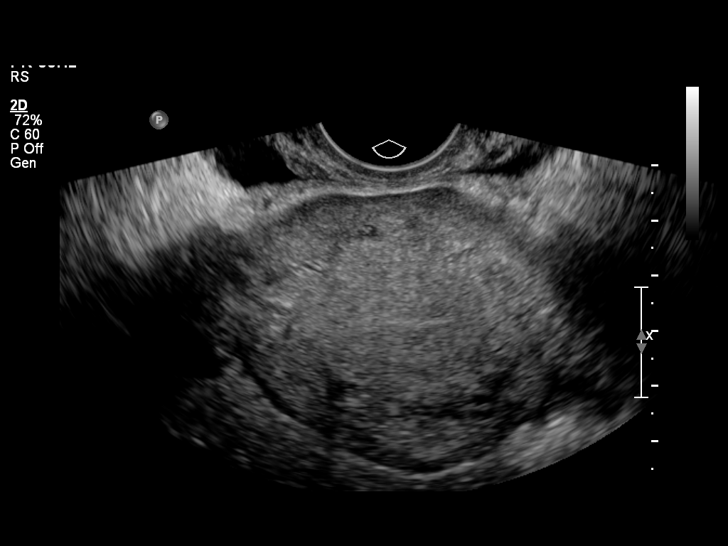
[im 20/52]
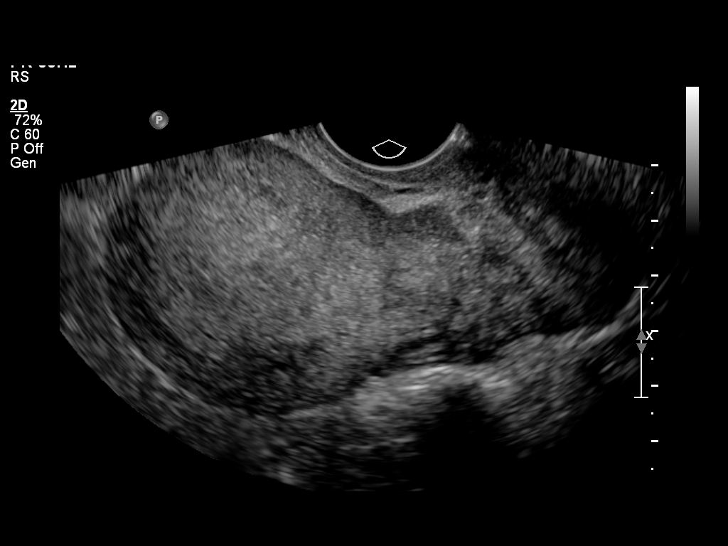
[im 24/52]
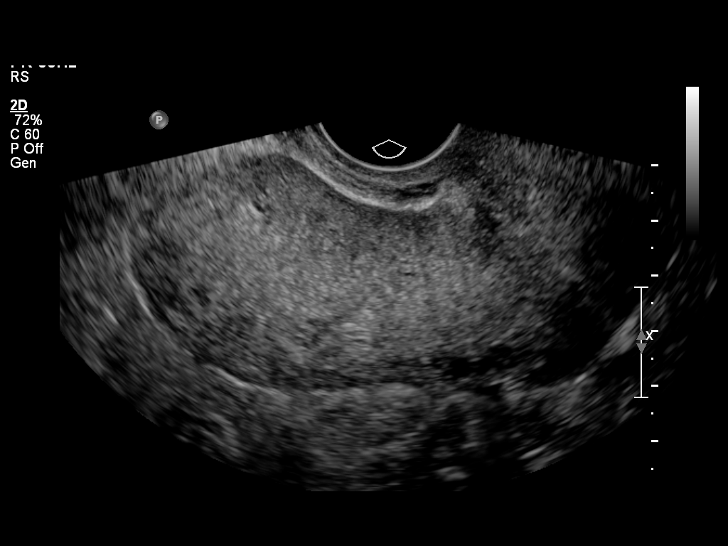
[im 28/52]
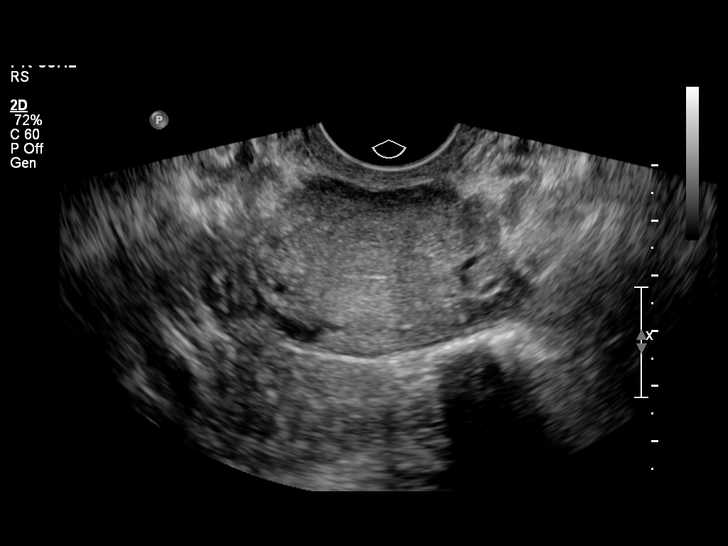
[im 32/52]
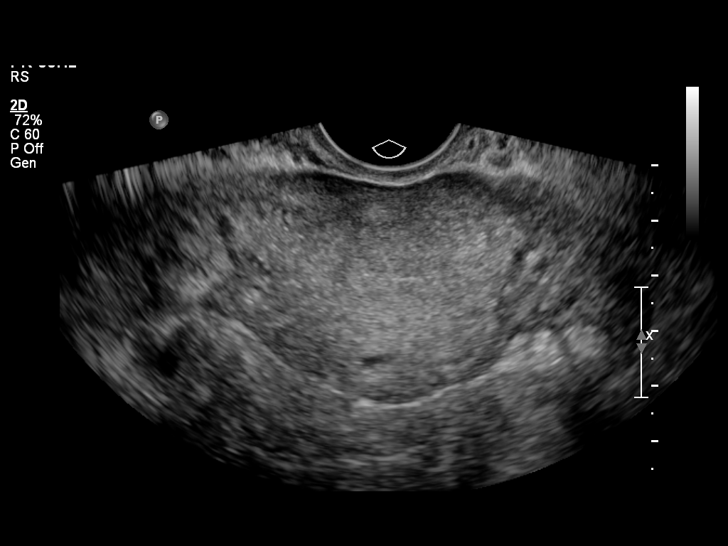
[im 35/52]
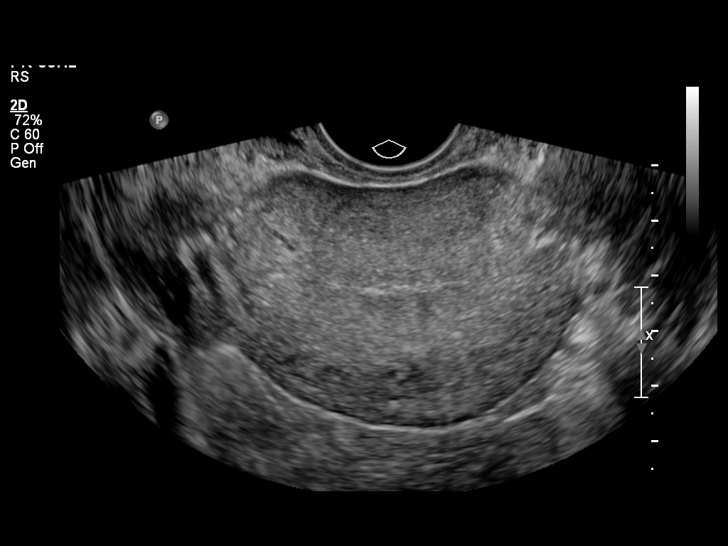
[im 39/52]
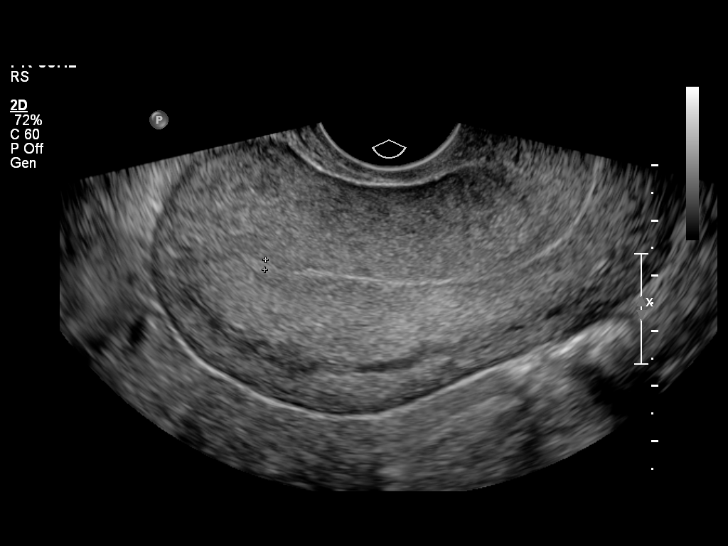
[im 43/52]
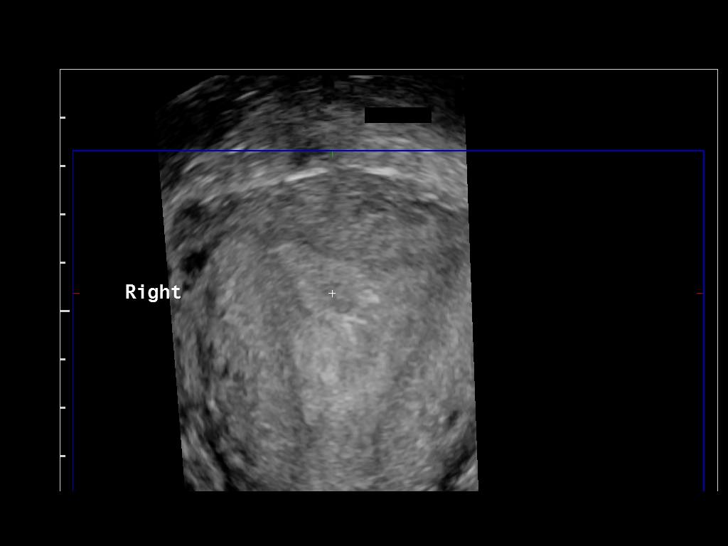
[im 47/52]
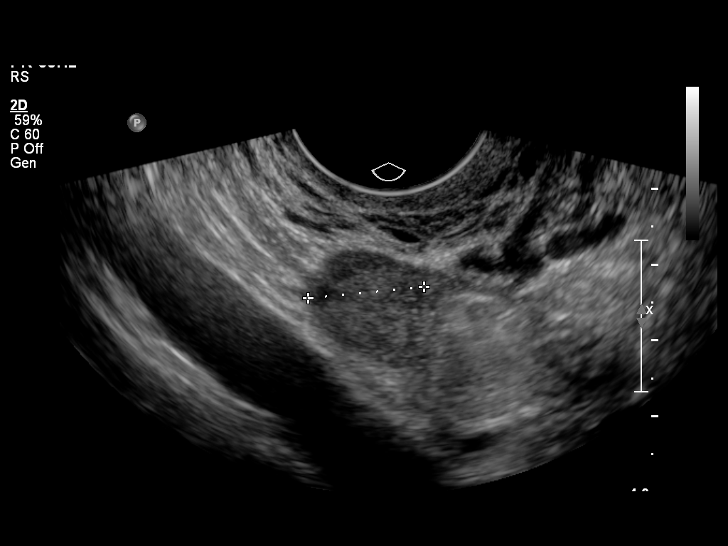
[im 52/52]
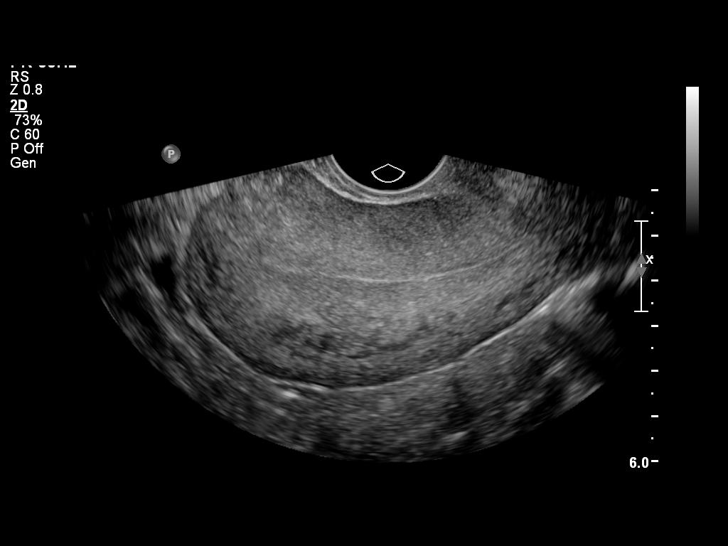

[14 of 25 positions shown; findings below may reference images not displayed]

FINDINGS: The uterus has a normal size and echotexture, measuring
9.6 x 5.0 x 6.0 cm.  The endometrial stripe is thin and
homogeneous, measuring 2 mm in width.

Both ovaries have a normal size and appearance.  The right ovary
measures 2.0 x 1.1 x 1.5 cm, and the left ovary measures 2.4 x
x 1.6 cm.  There are no adnexal masses or free pelvic fluid.
IMPRESSION: Normal pelvic ultrasound.

## 2013-05-16 ENCOUNTER — Ambulatory Visit: Payer: No Typology Code available for payment source | Admitting: Interventional Cardiology

## 2013-05-31 ENCOUNTER — Emergency Department: Payer: Self-pay | Admitting: Emergency Medicine

## 2013-05-31 LAB — CBC WITH DIFFERENTIAL/PLATELET
Basophil %: 0.8 %
Eosinophil %: 1.2 %
HGB: 13.3 g/dL (ref 12.0–16.0)
Lymphocyte %: 28.5 %
MCHC: 34 g/dL (ref 32.0–36.0)
MCV: 88 fL (ref 80–100)
Platelet: 219 10*3/uL (ref 150–440)
RBC: 4.46 10*6/uL (ref 3.80–5.20)
RDW: 12.8 % (ref 11.5–14.5)
WBC: 7.3 10*3/uL (ref 3.6–11.0)

## 2013-05-31 LAB — BASIC METABOLIC PANEL
Anion Gap: 5 — ABNORMAL LOW (ref 7–16)
Calcium, Total: 8.2 mg/dL — ABNORMAL LOW (ref 8.5–10.1)
Chloride: 106 mmol/L (ref 98–107)
Co2: 26 mmol/L (ref 21–32)
EGFR (African American): >60
EGFR (Non-African Amer.): >60
Sodium: 137 mmol/L (ref 136–145)

## 2013-06-08 ENCOUNTER — Encounter: Payer: Self-pay | Admitting: Interventional Cardiology

## 2013-11-28 NOTE — Telephone Encounter (Signed)
ERROR NOTE

## 2013-12-19 ENCOUNTER — Ambulatory Visit: Payer: Federal, State, Local not specified - PPO | Attending: Orthopaedic Surgery

## 2013-12-21 ENCOUNTER — Other Ambulatory Visit: Payer: Self-pay | Admitting: Orthopaedic Surgery

## 2013-12-21 DIAGNOSIS — M25572 Pain in left ankle and joints of left foot: Secondary | ICD-10-CM

## 2013-12-26 ENCOUNTER — Ambulatory Visit
Admission: RE | Admit: 2013-12-26 | Discharge: 2013-12-26 | Disposition: A | Discharge status: Home / Self Care | Payer: Federal, State, Local not specified - PPO | Source: Ambulatory Visit | Attending: Orthopaedic Surgery | Admitting: Orthopaedic Surgery

## 2013-12-26 DIAGNOSIS — M25572 Pain in left ankle and joints of left foot: Secondary | ICD-10-CM

## 2014-01-31 ENCOUNTER — Ambulatory Visit (INDEPENDENT_AMBULATORY_CARE_PROVIDER_SITE_OTHER): Payer: Federal, State, Local not specified - PPO | Admitting: Internal Medicine

## 2014-01-31 ENCOUNTER — Other Ambulatory Visit: Payer: Self-pay | Admitting: Internal Medicine

## 2014-01-31 ENCOUNTER — Encounter: Payer: Self-pay | Admitting: Internal Medicine

## 2014-01-31 VITALS — BP 110/78 | HR 72 | Temp 97.9°F | Ht 67.75 in | Wt 240.0 lb

## 2014-01-31 DIAGNOSIS — Z0000 Encounter for general adult medical examination without abnormal findings: Secondary | ICD-10-CM

## 2014-01-31 DIAGNOSIS — E669 Obesity, unspecified: Secondary | ICD-10-CM | POA: Insufficient documentation

## 2014-01-31 DIAGNOSIS — G43009 Migraine without aura, not intractable, without status migrainosus: Secondary | ICD-10-CM

## 2014-01-31 MED ORDER — BUTALBITAL-APAP-CAFFEINE 50-325-40 MG PO TABS: 1.0000 | ORAL_TABLET | Freq: Two times a day (BID) | ORAL | Status: DC | PRN

## 2014-01-31 NOTE — Patient Instructions (Addendum)

## 2014-01-31 NOTE — Progress Notes (Signed)
Pre visit review using our clinic review tool, if applicable. No additional management support is needed unless otherwise documented below in the visit note. 

## 2014-01-31 NOTE — Progress Notes (Signed)
HPI  Pt presents to the clinic today to establish care. She has not had a PCP in many years.  Migraines: She reports they are occuring once per week. She reports they start out left side behind her eye. She does have some flickering, sensitivity to lights, sounds. She also has nausea and vomiting. She reports that laying in a dark, quiet room which does seem to help. She has tried fioricet in the past which did seem to help. She has also tried Relpax, Imitrex which have not worked for her. Maxalt has also helped in the past as well. She reports that she has also tried a beta blocker, celexa and topomax without relief.  Flu: 04/2013 Tetanus: unsure LMP: Hysterectomy Pap Smear: 2014 Dentist: biannually  Past Medical History  Diagnosis Date  . Bleeding     POST PARTUM BLEEDING  . Migraine   . Bronchitis with flu 06/12/11  . Heart murmur     while pregnant    Current Outpatient Prescriptions  Medication Sig Dispense Refill  . Multiple Vitamin (MULTIVITAMIN) tablet Take 1 tablet by mouth daily.       No current facility-administered medications for this visit.    Allergies  Allergen Reactions  . Relpax [Eletriptan Hydrobromide] Shortness Of Breath    And increased heart rate    Family History  Problem Relation Age of Onset  . Hypertension Father   . Diabetes Father   . Hypertension Mother   . Cancer Mother     uterine  . Diabetes Maternal Aunt   . Diabetes Maternal Uncle   . Cancer Maternal Grandmother     PANCREATIC  . Hypertension Maternal Grandmother   . Diabetes Maternal Grandmother   . Cancer Maternal Grandfather     LUNG  . Hypertension Maternal Grandfather   . Diabetes Maternal Grandfather   . Cancer Paternal Grandmother     THROAT  . Alzheimer's disease Paternal Grandmother     History   Social History  . Marital Status: Divorced    Spouse Name: N/A    Number of Children: N/A  . Years of Education: N/A   Occupational History  . Not on file.    Social History Main Topics  . Smoking status: Never Smoker   . Smokeless tobacco: Not on file  . Alcohol Use: 0.5 oz/week    1 drink(s) per week     Comment: occasional  . Drug Use: No  . Sexual Activity: Yes    Birth Control/ Protection: IUD, Pill   Other Topics Concern  . Not on file   Social History Narrative  . No narrative on file    ROS:  Constitutional: Pt reports migraines. Denies fever, malaise, fatigue,  or abrupt weight changes.  Respiratory: Denies difficulty breathing, shortness of breath, cough or sputum production.   Cardiovascular: Denies chest pain, chest tightness, palpitations or swelling in the hands or feet.  Neurological: Denies dizziness, difficulty with memory, difficulty with speech or problems with balance and coordination.   No other specific complaints in a complete review of systems (except as listed in HPI above).  PE:  BP 110/78  Pulse 72  Temp(Src) 97.9 F (36.6 C) (Oral)  Ht 5' 7.75" (1.721 m)  Wt 240 lb (108.863 kg)  BMI 36.76 kg/m2  SpO2 99%  LMP 09/06/2010 Wt Readings from Last 3 Encounters:  01/31/14 240 lb (108.863 kg)  12/08/11 226 lb 4 oz (102.626 kg)  07/12/11 206 lb (93.441 kg)    General: Appears  her stated age, obese well developed, well nourished in NAD. Cardiovascular: Normal rate and rhythm. S1,S2 noted.  No murmur, rubs or gallops noted. No JVD or BLE edema. No carotid bruits noted. Pulmonary/Chest: Normal effort and positive vesicular breath sounds. No respiratory distress. No wheezes, rales or ronchi noted.  Neurological: Alert and oriented. Cranial nerves II-XII intact. Coordination normal. +DTRs bilaterally.    BMET    Component Value Date/Time   NA 133* 08/25/2010 2040   K 3.7 08/25/2010 2040   CL 100 08/25/2010 2040   CO2 25 08/25/2010 2040   GLUCOSE 83 08/25/2010 2040   BUN 11 08/25/2010 2040   CREATININE 0.71 08/25/2010 2040   CALCIUM 8.7 08/25/2010 2040   GFRNONAA >60 08/25/2010 2040   GFRAA  Value: >60         The eGFR has been calculated using the MDRD equation. This calculation has not been validated in all clinical situations. eGFR's persistently <60 mL/min signify possible Chronic Kidney Disease. 08/25/2010 2040    Lipid Panel  No results found for this basename: chol, trig, hdl, cholhdl, vldl, ldlcalc    CBC    Component Value Date/Time   WBC 14.4* 07/13/2011 0609   RBC 3.71* 07/13/2011 0609   HGB 11.1* 07/13/2011 0609   HCT 33.1* 07/13/2011 0609   PLT 265 07/13/2011 0609   MCV 89.2 07/13/2011 0609   MCH 29.9 07/13/2011 0609   MCHC 33.5 07/13/2011 0609   RDW 12.7 07/13/2011 0609   LYMPHSABS 2.0 04/15/2010 2050   MONOABS 0.6 04/15/2010 2050   EOSABS 0.1 04/15/2010 2050   BASOSABS 0.0 04/15/2010 2050    Hgb A1C No results found for this basename: HGBA1C     Assessment and Plan:

## 2014-01-31 NOTE — Assessment & Plan Note (Signed)
Has tried several different medications Will refer to headache clinic

## 2014-02-04 ENCOUNTER — Other Ambulatory Visit: Payer: Federal, State, Local not specified - PPO

## 2014-03-28 ENCOUNTER — Telehealth: Payer: Self-pay

## 2014-03-28 ENCOUNTER — Other Ambulatory Visit: Payer: Self-pay | Admitting: Internal Medicine

## 2014-03-28 ENCOUNTER — Telehealth: Payer: Self-pay | Admitting: Internal Medicine

## 2014-03-28 MED ORDER — SUMATRIPTAN SUCCINATE 25 MG PO TABS: 25.0000 mg | ORAL_TABLET | ORAL | Status: DC | PRN

## 2014-03-28 MED ORDER — RIZATRIPTAN BENZOATE 5 MG PO TABS: 5.0000 mg | ORAL_TABLET | ORAL | Status: DC | PRN

## 2014-03-28 NOTE — Telephone Encounter (Signed)
Pt left v/m; med for migraine h/as is not working and pt does not have neurology appt until Oct. Pt request different med for migraines.Pt was unable to work today. Pt request cb.[name]

## 2014-03-28 NOTE — Telephone Encounter (Signed)
Patient said she can't take Imitrex.  Patient said the Imitrex intensifies the pain.  Patient said she can take Maxalt. Patient's pharmacy is [facility].  Please call patient when prescription is called in to pharmacy.

## 2014-03-28 NOTE — Telephone Encounter (Signed)
Rx called in to pharmacy. 

## 2014-03-28 NOTE — Telephone Encounter (Signed)
Will call in imitrex.   

## 2014-03-28 NOTE — Telephone Encounter (Signed)
I ordered  maxalt

## 2014-04-01 ENCOUNTER — Ambulatory Visit: Payer: Federal, State, Local not specified - PPO

## 2014-04-15 ENCOUNTER — Ambulatory Visit (INDEPENDENT_AMBULATORY_CARE_PROVIDER_SITE_OTHER): Payer: Federal, State, Local not specified - PPO | Admitting: Internal Medicine

## 2014-04-15 ENCOUNTER — Encounter: Payer: Self-pay | Admitting: Internal Medicine

## 2014-04-15 VITALS — BP 130/78 | HR 88 | Resp 18 | Wt 244.0 lb

## 2014-04-15 DIAGNOSIS — F32A Depression, unspecified: Secondary | ICD-10-CM | POA: Insufficient documentation

## 2014-04-15 DIAGNOSIS — F410 Panic disorder [episodic paroxysmal anxiety] without agoraphobia: Secondary | ICD-10-CM

## 2014-04-15 DIAGNOSIS — F419 Anxiety disorder, unspecified: Secondary | ICD-10-CM

## 2014-04-15 DIAGNOSIS — F329 Major depressive disorder, single episode, unspecified: Secondary | ICD-10-CM | POA: Insufficient documentation

## 2014-04-15 MED ORDER — CITALOPRAM HYDROBROMIDE 10 MG PO TABS: 10.0000 mg | ORAL_TABLET | Freq: Every day | ORAL | Status: DC

## 2014-04-15 MED ORDER — ALPRAZOLAM 0.25 MG PO TABS: 0.2500 mg | ORAL_TABLET | Freq: Two times a day (BID) | ORAL | Status: DC | PRN

## 2014-04-15 NOTE — Patient Instructions (Addendum)
Generalized Anxiety Disorder Generalized anxiety disorder (GAD) is a mental disorder. It interferes with life functions, including relationships, work, and school. GAD is different from normal anxiety, which everyone experiences at some point in their lives in response to specific life events and activities. Normal anxiety actually helps us prepare for and get through these life events and activities. Normal anxiety goes away after the event or activity is over.  GAD causes anxiety that is not necessarily related to specific events or activities. It also causes excess anxiety in proportion to specific events or activities. The anxiety associated with GAD is also difficult to control. GAD can vary from mild to severe. People with severe GAD can have intense waves of anxiety with physical symptoms (panic attacks).  SYMPTOMS The anxiety and worry associated with GAD are difficult to control. This anxiety and worry are related to many life events and activities and also occur more days than not for 6 months or longer. People with GAD also have three or more of the following symptoms (one or more in children):  Restlessness.   Fatigue.  Difficulty concentrating.   Irritability.  Muscle tension.  Difficulty sleeping or unsatisfying sleep. DIAGNOSIS GAD is diagnosed through an assessment by your health care provider. Your health care provider will ask you questions aboutyour mood,physical symptoms, and events in your life. Your health care provider may ask you about your medical history and use of alcohol or drugs, including prescription medicines. Your health care provider may also do a physical exam and blood tests. Certain medical conditions and the use of certain substances can cause symptoms similar to those associated with GAD. Your health care provider may refer you to a mental health specialist for further evaluation. TREATMENT The following therapies are usually used to treat GAD:    Medication. Antidepressant medication usually is prescribed for long-term daily control. Antianxiety medicines may be added in severe cases, especially when panic attacks occur.   Talk therapy (psychotherapy). Certain types of talk therapy can be helpful in treating GAD by providing support, education, and guidance. A form of talk therapy called cognitive behavioral therapy can teach you healthy ways to think about and react to daily life events and activities.  Stress managementtechniques. These include yoga, meditation, and exercise and can be very helpful when they are practiced regularly. A mental health specialist can help determine which treatment is best for you. Some people see improvement with one therapy. However, other people require a combination of therapies. Document Released: 10/23/2012 Document Revised: 11/12/2013 Document Reviewed: 10/23/2012 [employer] Patient Information 2015 [employer]. This information is not intended to replace advice given to you by your health care provider. Make sure you discuss any questions you have with your health care provider.  

## 2014-04-15 NOTE — Progress Notes (Signed)
Subjective:    Patient ID: Linda [name], female    DOB: 04-19-1976, 38 y.o.   MRN: [id number]  HPI  Pt presents to the clinic today with c/o anxiety. This started 1 week ago after her husband told her he was moving out. She reports that she is experiencing shortness of breath, palpitations, tingling in her hands and feeling extremely hot. She had to pull off the road this morning while she was having one of these episodes. She eventually calms down after trying to take deep breaths.  She reports that she has had 3 episodes since Thursday. She has never had anxiety in the past. She does not feel depressed. She denies SI/HI.  Review of Systems      Past Medical History  Diagnosis Date  . Bleeding     POST PARTUM BLEEDING  . Migraine   . Bronchitis with flu 06/12/11  . Heart murmur     while pregnant    Current Outpatient Prescriptions  Medication Sig Dispense Refill  . Multiple Vitamin (MULTIVITAMIN) tablet Take 1 tablet by mouth daily.      . rizatriptan (MAXALT) 5 MG tablet Take 1 tablet (5 mg total) by mouth as needed for migraine. May repeat in 2 hours if needed  10 tablet  0   No current facility-administered medications for this visit.    Allergies  Allergen Reactions  . Relpax [Eletriptan Hydrobromide] Shortness Of Breath    And increased heart rate    Family History  Problem Relation Age of Onset  . Hypertension Father   . Diabetes Father   . Hypertension Mother   . Cancer Mother     uterine  . Stroke Mother   . Diabetes Maternal Aunt   . Diabetes Maternal Uncle   . Cancer Maternal Grandmother     PANCREATIC  . Hypertension Maternal Grandmother   . Diabetes Maternal Grandmother   . Cancer Maternal Grandfather     LUNG  . Hypertension Maternal Grandfather   . Diabetes Maternal Grandfather   . Cancer Paternal Grandmother     THROAT  . Alzheimer's disease Paternal Grandmother     History   Social History  . Marital Status: Divorced    Spouse  Name: N/A    Number of Children: N/A  . Years of Education: N/A   Occupational History  . Not on file.   Social History Main Topics  . Smoking status: Never Smoker   . Smokeless tobacco: Not on file  . Alcohol Use: 0.5 oz/week    1 drink(s) per week     Comment: occasional  . Drug Use: No  . Sexual Activity: Yes    Birth Control/ Protection: Surgical   Other Topics Concern  . Not on file   Social History Narrative  . No narrative on file     Constitutional: Denies fever, malaise, fatigue, headache or abrupt weight changes.  Respiratory: Pt reports shortness of breath. Denies difficulty breathing, cough or sputum production.   Cardiovascular: Pt reports palpitations. Denies chest pain, chest tightness, or swelling in the hands or feet.  Psych: Pt reports anxiety. Denies depression, SI/HI.  No other specific complaints in a complete review of systems (except as listed in HPI above).  Objective:   Physical Exam   BP 130/78  Pulse 88  Resp 18  Wt 244 lb (110.678 kg)  LMP 09/06/2010 Wt Readings from Last 3 Encounters:  04/15/14 244 lb (110.678 kg)  01/31/14 240 lb (108.863 kg)  12/08/11 226 lb 4 oz (102.626 kg)    General: Appears her stated age, obese but well developed, well nourished in NAD. Cardiovascular: Normal rate and rhythm. S1,S2 noted.  No murmur, rubs or gallops noted.  Pulmonary/Chest: Normal effort and positive vesicular breath sounds. No respiratory distress. No wheezes, rales or ronchi noted.  Psychiatric: Mood and affect normal. Anxious appearing.  Judgment and thought content normal.     BMET    Component Value Date/Time   NA 133* 08/25/2010 2040   K 3.7 08/25/2010 2040   CL 100 08/25/2010 2040   CO2 25 08/25/2010 2040   GLUCOSE 83 08/25/2010 2040   BUN 11 08/25/2010 2040   CREATININE 0.71 08/25/2010 2040   CALCIUM 8.7 08/25/2010 2040   GFRNONAA >60 08/25/2010 2040   GFRAA  Value: >60        The eGFR has been calculated using the MDRD equation.  This calculation has not been validated in all clinical situations. eGFR's persistently <60 mL/min signify possible Chronic Kidney Disease. 08/25/2010 2040    Lipid Panel  No results found for this basename: chol, trig, hdl, cholhdl, vldl, ldlcalc    CBC    Component Value Date/Time   WBC 14.4* 07/13/2011 0609   RBC 3.71* 07/13/2011 0609   HGB 11.1* 07/13/2011 0609   HCT 33.1* 07/13/2011 0609   PLT 265 07/13/2011 0609   MCV 89.2 07/13/2011 0609   MCH 29.9 07/13/2011 0609   MCHC 33.5 07/13/2011 0609   RDW 12.7 07/13/2011 0609   LYMPHSABS 2.0 04/15/2010 2050   MONOABS 0.6 04/15/2010 2050   EOSABS 0.1 04/15/2010 2050   BASOSABS 0.0 04/15/2010 2050    Hgb A1C No results found for this basename: HGBA1C        Assessment & Plan:

## 2014-04-15 NOTE — Assessment & Plan Note (Signed)
With panic attacks Situational  Support offered today Will start Celexa 10 mg daily Will start supplemental xanax 0.25 mg prn for severe panic attacks Encouraged her to try counseling- she will self refer

## 2014-04-23 ENCOUNTER — Encounter: Payer: Self-pay | Admitting: Neurology

## 2014-04-23 ENCOUNTER — Other Ambulatory Visit: Payer: Self-pay | Admitting: *Deleted

## 2014-04-23 ENCOUNTER — Telehealth: Payer: Self-pay | Admitting: Neurology

## 2014-04-23 ENCOUNTER — Ambulatory Visit (INDEPENDENT_AMBULATORY_CARE_PROVIDER_SITE_OTHER): Payer: Federal, State, Local not specified - PPO | Admitting: Neurology

## 2014-04-23 VITALS — BP 132/76 | HR 68 | Ht 68.0 in | Wt 245.0 lb

## 2014-04-23 DIAGNOSIS — G43719 Chronic migraine without aura, intractable, without status migrainosus: Secondary | ICD-10-CM

## 2014-04-23 MED ORDER — TOPIRAMATE 25 MG PO TABS
25.0000 mg | ORAL_TABLET | Freq: Two times a day (BID) | ORAL | Status: DC
Start: 2014-04-23 — End: 2014-05-27

## 2014-04-23 MED ORDER — ZOLPIDEM TARTRATE 10 MG PO TABS: ORAL_TABLET | ORAL | Status: DC

## 2014-04-23 MED ORDER — ZOLMITRIPTAN 5 MG PO TABS: ORAL_TABLET | ORAL | Status: DC

## 2014-04-23 MED ORDER — ZOLMITRIPTAN 5 MG NA SOLN: NASAL | Status: DC

## 2014-04-23 NOTE — Telephone Encounter (Signed)
Spoke with patient cleared up misunderstanding about RX

## 2014-04-23 NOTE — Progress Notes (Signed)
NEUROLOGY CONSULTATION NOTE  Linda [name] MRN: [id number] DOB: 12-13-1975  Referring provider: Webb Silversmith, NP Primary care provider: Webb Silversmith, NP  Reason for consult:  Headache  HISTORY OF PRESENT ILLNESS: Linda [name] is a 38 year old right-handed woman with history of hysterectomy, migraine and panic attacks who presents for headache.  Records reviewed.  Onset:  Around age 52 Location:  Varies.  Either temple, bi-frontal, sometimes radiating down the right side of neck Quality:  Migraines are severe hammer.  Daily headaches are dull throbbing Intensity:  Migraines are 10/10.  Daily headaches 3/10 Aura:  no Prodrome:  no Associated symptoms:  Nausea, phonophobia, osmophobia, sometimes sees spots, sometimes vomiting.   Duration:  3-5 days Frequency:  Once a month (but has daily headache) Triggers/exacerbating factors:  Wine, hunger, scents Relieving factors:  none Activity:  Cannot function with migraines  Past abortive therapy:  Fioricet, Maxalt 5mg  (initially helpful), Relpax (ineffective), sumatriptan 50mg  po (caused head burning), Exedrin, ibuprofen, naproxen, Midrin Past preventative therapy:  Topamax 75mg  (helpful but was taken off it several years ago for unknown reason.  Denied any major side effects or renal problems).  Current abortive therapy:  none Current preventative therapy:  none Other medications:  citalopram 10mg  (just started 6 days ago)  Caffeine:  Tea every other day Alcohol:  rarely  Smoker:  no Diet:  Poor.  Stays hydrated Exercise:  no Depression/stress:  She has been experiencing panic attacks.  Just started citalopram. Sleep hygiene:  Poor.  Prolonged sleep latency.  Wakes up often.  About 3-4 hours per night Family history of headache:  no  She did have a CT of the head performed on 09/08/06 for headache, which was reviewed, which was unremarkable.  It did reveal a punctate calcification along the right transverse sinus, within  normal limits, and is showed some fluid in the mastoid air cells bilaterally.  PAST MEDICAL HISTORY: Past Medical History  Diagnosis Date  . Bleeding     POST PARTUM BLEEDING  . Migraine   . Bronchitis with flu 06/12/11  . Heart murmur     while pregnant  . Anxiety     PAST SURGICAL HISTORY: Past Surgical History  Procedure Laterality Date  . Tonsilectomy, adenoidectomy, bilateral myringotomy and tubes  2010  . Endometrial ablation  2012  . Abdominal hysterectomy  12/20012    Total  . Tubal ligation  08/2010    MEDICATIONS: Current Outpatient Prescriptions on File Prior to Visit  Medication Sig Dispense Refill  . citalopram (CELEXA) 10 MG tablet Take 1 tablet (10 mg total) by mouth daily.  30 tablet  2  . Multiple Vitamin (MULTIVITAMIN) tablet Take 1 tablet by mouth daily.       No current facility-administered medications on file prior to visit.    ALLERGIES: No Known Allergies  FAMILY HISTORY: Family History  Problem Relation Age of Onset  . Hypertension Father   . Diabetes Father   . Hypertension Mother   . Cancer Mother     uterine  . Stroke Mother   . Diabetes Maternal Aunt   . Diabetes Maternal Uncle   . Cancer Maternal Grandmother     PANCREATIC  . Hypertension Maternal Grandmother   . Diabetes Maternal Grandmother   . Cancer Maternal Grandfather     LUNG  . Hypertension Maternal Grandfather   . Diabetes Maternal Grandfather   . Cancer Paternal Grandmother     THROAT  . Alzheimer's disease Paternal Grandmother  SOCIAL HISTORY: History   Social History  . Marital Status: Divorced    Spouse Name: N/A    Number of Children: N/A  . Years of Education: N/A   Occupational History  . Not on file.   Social History Main Topics  . Smoking status: Never Smoker   . Smokeless tobacco: Not on file  . Alcohol Use: 0.5 oz/week    1 drink(s) per week     Comment: occasional (not much)  . Drug Use: No  . Sexual Activity: Yes    Birth Control/  Protection: Surgical   Other Topics Concern  . Not on file   Social History Narrative  . No narrative on file    REVIEW OF SYSTEMS: Constitutional: No fevers, chills, or sweats, no generalized fatigue, change in appetite Eyes: No visual changes, double vision, eye pain Ear, nose and throat: No hearing loss, ear pain, nasal congestion, sore throat Cardiovascular: No chest pain, palpitations Respiratory:  No shortness of breath at rest or with exertion, wheezes GastrointestinaI: No nausea, vomiting, diarrhea, abdominal pain, fecal incontinence Genitourinary:  No dysuria, urinary retention or frequency Musculoskeletal:  No neck pain, back pain Integumentary: No rash, pruritus, skin lesions Neurological: as above Psychiatric: No depression, insomnia, anxiety Endocrine: No palpitations, fatigue, diaphoresis, mood swings, change in appetite, change in weight, increased thirst Hematologic/Lymphatic:  No anemia, purpura, petechiae. Allergic/Immunologic: no itchy/runny eyes, nasal congestion, recent allergic reactions, rashes  PHYSICAL EXAM: Filed Vitals:   04/23/14 0756  BP: 132/76  Pulse: 68   General: No acute distress Head:  Normocephalic/atraumatic Neck: supple, no paraspinal tenderness, full range of motion Back: No paraspinal tenderness Heart: regular rate and rhythm Lungs: Clear to auscultation bilaterally. Vascular: No carotid bruits. Neurological Exam: Mental status: alert and oriented to person, place, and time, recent and remote memory intact, fund of knowledge intact, attention and concentration intact, speech fluent and not dysarthric, language intact. Cranial nerves: CN I: not tested CN II: pupils equal, round and reactive to light, visual fields intact, fundi unremarkable, without vessel changes, exudates, hemorrhages or papilledema. CN III, IV, VI:  full range of motion, no nystagmus, no ptosis CN V: facial sensation intact CN VII: upper and lower face  symmetric CN VIII: hearing intact CN IX, X: gag intact, uvula midline CN XI: sternocleidomastoid and trapezius muscles intact CN XII: tongue midline Bulk & Tone: normal, no fasciculations. Motor: 5/5 throughout Sensation: pinprick and vibration intact Deep Tendon Reflexes: 2+ throughout, toes downgoing Finger to nose testing: no dysmetria Heel to shin: no dysmetria Gait: normal station and stride.  Able to turn and walk in tandem. Romberg negative.  IMPRESSION: Chronic migraine without aura  PLAN: 1.  Initiate topamax 25mg .  Call in 4 weeks. 2.  For abortive therapy, will try Zomig 5mg  NS (fast onset of action) ADDENDUM:  She cannot afford the nasal spray, so we will prescribe Zomig pills. 3.  Provide tips to improve sleep hygiene.  Gave Ambien taper. 4.  Exercise and improve diet 5.  Follow up in 3 months.  Thank you for allowing me to take part in the care of this patient.  Metta Clines, DO  CC:  Webb Silversmith, NP

## 2014-04-23 NOTE — Patient Instructions (Signed)
1.  Start topamax 25mg  at bedtime. Possible side effects include: impaired thinking, sedation, paresthesias (numbness and tingling) and weight loss.  It may cause dehydration and there is a small risk for kidney stones, so make sure to stay hydrated with water during the day.  There is also a very small risk for glaucoma, so if you notice any change in your vision while taking this medication, see an ophthalmologist.  mgCall in 4 weeks with update. 2.  For acute severe migraine, take Zomig 5mg  nasal spray, 1 spray in one nostril x1.  May repeat dose once in 2 hours if needed.  Do not exceed 2 doses in 24 hours. 3.  Exercise, diet. 4.  Improve sleep hygiene.  Start sleeping exercises.  Ambien 10mg  tab.  Take 1tab at bedtime for 4 nights, then 0.5tab at bedtime for 2 nights, then 0.25 tab at bedtime for 4 nights. 5.  Keep headache diary 6.  Follow up in 3 months.

## 2014-04-23 NOTE — Telephone Encounter (Signed)
Pt called requesting to speak to a nurse regarding the new script [name] Rx him. Please call pt # [phone]

## 2014-04-26 ENCOUNTER — Telehealth: Payer: Self-pay | Admitting: *Deleted

## 2014-04-26 NOTE — Telephone Encounter (Signed)
Patient called stating she had face and neck burning and severe pain after taking the first dose of Zomig she still has a headache. I advised her to stop the Zomig until discussed with Dr Tomi Likens . Please advise

## 2014-05-13 ENCOUNTER — Telehealth: Payer: Self-pay

## 2014-05-13 DIAGNOSIS — F410 Panic disorder [episodic paroxysmal anxiety] without agoraphobia: Secondary | ICD-10-CM

## 2014-05-13 NOTE — Telephone Encounter (Signed)
Pt left v/m requesting cb for med question; left v/m requesting pt to cb.

## 2014-05-23 MED ORDER — ALPRAZOLAM 0.25 MG PO TABS: 0.2500 mg | ORAL_TABLET | Freq: Two times a day (BID) | ORAL | Status: DC | PRN

## 2014-05-23 NOTE — Telephone Encounter (Signed)
Ok to phone in xanax 

## 2014-05-23 NOTE — Telephone Encounter (Signed)
Rx called in to pharmacy. 

## 2014-05-23 NOTE — Telephone Encounter (Signed)
Do the 0.25 mg BID prn # 20 0 refills

## 2014-05-23 NOTE — Telephone Encounter (Signed)
There are 2 different Rx and quantity--please advise

## 2014-05-23 NOTE — Telephone Encounter (Signed)
Spoke with pt and she request refill alprazolam to [facility] [address].Please advise.

## 2014-05-27 ENCOUNTER — Other Ambulatory Visit: Payer: Self-pay | Admitting: *Deleted

## 2014-05-27 ENCOUNTER — Telehealth: Payer: Self-pay | Admitting: Neurology

## 2014-05-27 DIAGNOSIS — G43009 Migraine without aura, not intractable, without status migrainosus: Secondary | ICD-10-CM

## 2014-05-27 MED ORDER — ZOLPIDEM TARTRATE 10 MG PO TABS: ORAL_TABLET | ORAL | Status: DC

## 2014-05-27 MED ORDER — TOPIRAMATE 25 MG PO TABS: 25.0000 mg | ORAL_TABLET | Freq: Two times a day (BID) | ORAL | Status: DC

## 2014-05-27 NOTE — Telephone Encounter (Signed)
Pt called requesting a refill for the TOPAMAX and AMBIAN. Pharmacy: [facility] on [address]  C/B [phone]

## 2014-06-12 ENCOUNTER — Encounter: Payer: Self-pay | Admitting: Internal Medicine

## 2014-06-12 ENCOUNTER — Ambulatory Visit (INDEPENDENT_AMBULATORY_CARE_PROVIDER_SITE_OTHER): Payer: Federal, State, Local not specified - PPO | Admitting: Internal Medicine

## 2014-06-12 VITALS — BP 122/76 | HR 84 | Temp 98.2°F | Wt 244.0 lb

## 2014-06-12 DIAGNOSIS — R1110 Vomiting, unspecified: Secondary | ICD-10-CM

## 2014-06-12 DIAGNOSIS — R1013 Epigastric pain: Secondary | ICD-10-CM

## 2014-06-12 DIAGNOSIS — K921 Melena: Secondary | ICD-10-CM

## 2014-06-12 DIAGNOSIS — K219 Gastro-esophageal reflux disease without esophagitis: Secondary | ICD-10-CM

## 2014-06-12 LAB — H. PYLORI ANTIBODY, IGG: H Pylori IgG: NEGATIVE

## 2014-06-12 MED ORDER — PANTOPRAZOLE SODIUM 40 MG PO TBEC: 40.0000 mg | DELAYED_RELEASE_TABLET | Freq: Two times a day (BID) | ORAL | Status: DC

## 2014-06-12 NOTE — Progress Notes (Signed)
Pre visit review using our clinic review tool, if applicable. No additional management support is needed unless otherwise documented below in the visit note. 

## 2014-06-12 NOTE — Patient Instructions (Signed)
Gastroesophageal Reflux Disease, Adult Gastroesophageal reflux disease (GERD) happens when acid from your stomach flows up into the esophagus. When acid comes in contact with the esophagus, the acid causes soreness (inflammation) in the esophagus. Over time, GERD may create small holes (ulcers) in the lining of the esophagus. CAUSES   Increased body weight. This puts pressure on the stomach, making acid rise from the stomach into the esophagus.  Smoking. This increases acid production in the stomach.  Drinking alcohol. This causes decreased pressure in the lower esophageal sphincter (valve or ring of muscle between the esophagus and stomach), allowing acid from the stomach into the esophagus.  Late evening meals and a full stomach. This increases pressure and acid production in the stomach.  A malformed lower esophageal sphincter. Sometimes, no cause is found. SYMPTOMS   Burning pain in the lower part of the mid-chest behind the breastbone and in the mid-stomach area. This may occur twice a week or more often.  Trouble swallowing.  Sore throat.  Dry cough.  Asthma-like symptoms including chest tightness, shortness of breath, or wheezing. DIAGNOSIS  Your caregiver may be able to diagnose GERD based on your symptoms. In some cases, X-rays and other tests may be done to check for complications or to check the condition of your stomach and esophagus. TREATMENT  Your caregiver may recommend over-the-counter or prescription medicines to help decrease acid production. Ask your caregiver before starting or adding any new medicines.  HOME CARE INSTRUCTIONS   Change the factors that you can control. Ask your caregiver for guidance concerning weight loss, quitting smoking, and alcohol consumption.  Avoid foods and drinks that make your symptoms worse, such as:  Caffeine or alcoholic drinks.  Chocolate.  Peppermint or mint flavorings.  Garlic and onions.  Spicy foods.  Citrus fruits,  such as oranges, lemons, or limes.  Tomato-based foods such as sauce, chili, salsa, and pizza.  Fried and fatty foods.  Avoid lying down for the 3 hours prior to your bedtime or prior to taking a nap.  Eat small, frequent meals instead of large meals.  Wear loose-fitting clothing. Do not wear anything tight around your waist that causes pressure on your stomach.  Raise the head of your bed 6 to 8 inches with wood blocks to help you sleep. Extra pillows will not help.  Only take over-the-counter or prescription medicines for pain, discomfort, or fever as directed by your caregiver.  Do not take aspirin, ibuprofen, or other nonsteroidal anti-inflammatory drugs (NSAIDs). SEEK IMMEDIATE MEDICAL CARE IF:   You have pain in your arms, neck, jaw, teeth, or back.  Your pain increases or changes in intensity or duration.  You develop nausea, vomiting, or sweating (diaphoresis).  You develop shortness of breath, or you faint.  Your vomit is green, yellow, black, or looks like coffee grounds or blood.  Your stool is red, bloody, or black. These symptoms could be signs of other problems, such as heart disease, gastric bleeding, or esophageal bleeding. MAKE SURE YOU:   Understand these instructions.  Will watch your condition.  Will get help right away if you are not doing well or get worse. Document Released: 04/07/2005 Document Revised: 09/20/2011 Document Reviewed: 01/15/2011 Exit[employer] Patient Information 2015 [employer]. This information is not intended to replace advice given to you by your health care Clements Toro. Make sure you discuss any questions you have with your health care Reyne Falconi.  

## 2014-06-12 NOTE — Progress Notes (Signed)
Subjective:    Patient ID: Linda [name], female    DOB: Dec 11, 1975, 38 y.o.   MRN: [id number]  HPI  Pt presents to the clinic today with c/o vomiting after her meals. She reports this started 3 weeks ago. It is occuring every day after eating. She does have some associated shortness of breath, which seems to be worse with bending over or laying down. She has tried Prilosec, Prevacid, Tums and Zantac with minimal relief (she does have a history of GERD). Her weight may be a contributing factor. She has gained 4 lbs in the last 4 months. She has also noticed some bright red blood on the toilet paper. She has not noticed any blood in the toilet. She is having normal BM's twice daily. Her stool is normal in color and consistency.  She denies abdominal pain, constipation or straining.  Review of Systems      Past Medical History  Diagnosis Date  . Bleeding     POST PARTUM BLEEDING  . Migraine   . Bronchitis with flu 06/12/11  . Heart murmur     while pregnant  . Anxiety     Current Outpatient Prescriptions  Medication Sig Dispense Refill  . ALPRAZolam (XANAX) 0.25 MG tablet Take 1 tablet (0.25 mg total) by mouth 2 (two) times daily as needed for anxiety. 20 tablet 0  . citalopram (CELEXA) 10 MG tablet Take 1 tablet (10 mg total) by mouth daily. 30 tablet 2  . Multiple Vitamin (MULTIVITAMIN) tablet Take 1 tablet by mouth daily.    Marland Kitchen topiramate (TOPAMAX) 25 MG tablet Take 1 tablet (25 mg total) by mouth 2 (two) times daily. 30 tablet 3  . zolmitriptan (ZOMIG) 5 MG nasal solution Spray 1 spray in 1 nostril x1.  May repeat dose x1 in 2hrs. 6 Units 0  . zolmitriptan (ZOMIG) 5 MG tablet Take one tablet by mouth at on set of headache can take one more tablet 2 hours later if needed 10 tablet 3  . zolpidem (AMBIEN) 10 MG tablet Take 1tab at bedtime for 4 nights, then 0.5tab at bedtime for 2 nights, then 0.25 tab at bedtime for 4 nights. 6 tablet 1   No current facility-administered  medications for this visit.    No Known Allergies  Family History  Problem Relation Age of Onset  . Hypertension Father   . Diabetes Father   . Hypertension Mother   . Cancer Mother     uterine  . Stroke Mother   . Diabetes Maternal Aunt   . Diabetes Maternal Uncle   . Cancer Maternal Grandmother     PANCREATIC  . Hypertension Maternal Grandmother   . Diabetes Maternal Grandmother   . Cancer Maternal Grandfather     LUNG  . Hypertension Maternal Grandfather   . Diabetes Maternal Grandfather   . Cancer Paternal Grandmother     THROAT  . Alzheimer's disease Paternal Grandmother     History   Social History  . Marital Status: Divorced    Spouse Name: N/A    Number of Children: N/A  . Years of Education: N/A   Occupational History  . Not on file.   Social History Main Topics  . Smoking status: Never Smoker   . Smokeless tobacco: Not on file  . Alcohol Use: 0.5 oz/week    1 drink(s) per week     Comment: occasional (not much)  . Drug Use: No  . Sexual Activity: Yes    Birth  Control/ Protection: Surgical   Other Topics Concern  . Not on file   Social History Narrative     Constitutional: Denies fever, malaise, fatigue, headache or abrupt weight changes.  Respiratory: Pt reports shortness of breath. Denies difficulty breathing, cough or sputum production.   Cardiovascular: Denies chest pain, chest tightness, palpitations or swelling in the hands or feet.  Gastrointestinal: Pt reports reflux, vomiting and blood in her stool. Denies abdominal pain, bloating, constipation, diarrhea.  GU: Denies urgency, frequency, pain with urination, burning sensation, blood in urine, odor or discharge.  No other specific complaints in a complete review of systems (except as listed in HPI above).  Objective:   Physical Exam   BP 122/76 mmHg  Pulse 84  Temp(Src) 98.2 F (36.8 C) (Oral)  Wt 244 lb (110.678 kg)  SpO2 98%  LMP 09/06/2010 Wt Readings from Last 3  Encounters:  06/12/14 244 lb (110.678 kg)  04/23/14 245 lb (111.131 kg)  04/15/14 244 lb (110.678 kg)    General: Appears her stated age, obese but well developed, well nourished in NAD. Cardiovascular: Normal rate and rhythm. S1,S2 noted.  No murmur, rubs or gallops noted.  Pulmonary/Chest: Normal effort and positive vesicular breath sounds. No respiratory distress. No wheezes, rales or ronchi noted.  Abdomen: Soft and tender in the epigastric region. Normal bowel sounds, no bruits noted. No distention or masses noted.  Rectal: She is refusing rectal exam today.   BMET    Component Value Date/Time   NA 133* 08/25/2010 2040   K 3.7 08/25/2010 2040   CL 100 08/25/2010 2040   CO2 25 08/25/2010 2040   GLUCOSE 83 08/25/2010 2040   BUN 11 08/25/2010 2040   CREATININE 0.71 08/25/2010 2040   CALCIUM 8.7 08/25/2010 2040   GFRNONAA >60 08/25/2010 2040   GFRAA  08/25/2010 2040    >60        The eGFR has been calculated using the MDRD equation. This calculation has not been validated in all clinical situations. eGFR's persistently <60 mL/min signify possible Chronic Kidney Disease.    CBC    Component Value Date/Time   WBC 14.4* 07/13/2011 0609   RBC 3.71* 07/13/2011 0609   HGB 11.1* 07/13/2011 0609   HCT 33.1* 07/13/2011 0609   PLT 265 07/13/2011 0609   MCV 89.2 07/13/2011 0609   MCH 29.9 07/13/2011 0609   MCHC 33.5 07/13/2011 0609   RDW 12.7 07/13/2011 0609   LYMPHSABS 2.0 04/15/2010 2050   MONOABS 0.6 04/15/2010 2050   EOSABS 0.1 04/15/2010 2050   BASOSABS 0.0 04/15/2010 2050        Assessment & Plan:   GERD:  Will check CMET and H Pylori today RX for Protonix 40 mg BID x 2 weeks If no improvement will consider CT scan of abdomen to r/o hiatal hernia  Blood in stool:  Likely hemorrhoids She is refusing exam today Discussed importance of not straining, drinking plenty of fluids Can try treating OTC with preporation H  RTC in 2 weeks to followup, sooner  if worse

## 2014-06-13 LAB — COMPREHENSIVE METABOLIC PANEL
ALT: 20 U/L (ref 0–35)
AST: 21 U/L (ref 0–37)
Albumin: 3.8 g/dL (ref 3.5–5.2)
Alkaline Phosphatase: 77 U/L (ref 39–117)
BUN: 13 mg/dL (ref 6–23)
CO2: 23 mEq/L (ref 19–32)
CREATININE: 0.9 mg/dL (ref 0.4–1.2)
Calcium: 8.4 mg/dL (ref 8.4–10.5)
Chloride: 108 mEq/L (ref 96–112)
GFR: 94.43 mL/min (ref >60.00)
Glucose, Bld: 91 mg/dL (ref 70–99)
Potassium: 3.8 mEq/L (ref 3.5–5.1)
Sodium: 132 mEq/L — ABNORMAL LOW (ref 135–145)
Total Bilirubin: 0.4 mg/dL (ref 0.2–1.2)
Total Protein: 6.9 g/dL (ref 6.0–8.3)

## 2014-06-25 ENCOUNTER — Other Ambulatory Visit: Payer: Self-pay | Admitting: Internal Medicine

## 2014-06-25 ENCOUNTER — Telehealth: Payer: Self-pay

## 2014-06-25 MED ORDER — ACYCLOVIR 5 % EX OINT: 1.0000 "application " | TOPICAL_OINTMENT | CUTANEOUS | Status: DC

## 2014-06-25 NOTE — Telephone Encounter (Signed)
[facility]

## 2014-06-25 NOTE — Telephone Encounter (Signed)
Pt left v/m; pt has had a cold and now has developed cold sore under nose; abreva is not helping and pt does not want to schedule appt. Pt wants to get med sent to [facility] on [address]. Pt request cb.

## 2014-07-04 ENCOUNTER — Telehealth: Payer: Self-pay | Admitting: Neurology

## 2014-07-04 NOTE — Telephone Encounter (Signed)
Pt will call later to r/s her 07/24/14 f/u appt. [name] is going to be out of the office on 07/24/14.

## 2014-07-07 ENCOUNTER — Emergency Department (HOSPITAL_COMMUNITY)
Admission: EM | Admit: 2014-07-07 | Discharge: 2014-07-07 | Disposition: A | Discharge status: Home / Self Care | Payer: Federal, State, Local not specified - PPO | Attending: Emergency Medicine | Admitting: Emergency Medicine

## 2014-07-07 ENCOUNTER — Encounter (HOSPITAL_COMMUNITY): Payer: Self-pay | Admitting: Emergency Medicine

## 2014-07-07 ENCOUNTER — Emergency Department (HOSPITAL_COMMUNITY): Payer: Federal, State, Local not specified - PPO

## 2014-07-07 DIAGNOSIS — Z7982 Long term (current) use of aspirin: Secondary | ICD-10-CM | POA: Diagnosis not present

## 2014-07-07 DIAGNOSIS — R079 Chest pain, unspecified: Secondary | ICD-10-CM

## 2014-07-07 DIAGNOSIS — G43909 Migraine, unspecified, not intractable, without status migrainosus: Secondary | ICD-10-CM | POA: Insufficient documentation

## 2014-07-07 DIAGNOSIS — J45901 Unspecified asthma with (acute) exacerbation: Secondary | ICD-10-CM | POA: Diagnosis not present

## 2014-07-07 DIAGNOSIS — R1110 Vomiting, unspecified: Secondary | ICD-10-CM | POA: Diagnosis not present

## 2014-07-07 DIAGNOSIS — Z79899 Other long term (current) drug therapy: Secondary | ICD-10-CM | POA: Diagnosis not present

## 2014-07-07 DIAGNOSIS — R011 Cardiac murmur, unspecified: Secondary | ICD-10-CM | POA: Diagnosis not present

## 2014-07-07 DIAGNOSIS — R0789 Other chest pain: Secondary | ICD-10-CM | POA: Insufficient documentation

## 2014-07-07 DIAGNOSIS — F419 Anxiety disorder, unspecified: Secondary | ICD-10-CM | POA: Diagnosis not present

## 2014-07-07 HISTORY — DX: Unspecified asthma, uncomplicated: J45.909

## 2014-07-07 LAB — HEPATIC FUNCTION PANEL
ALBUMIN: 3.5 g/dL (ref 3.5–5.2)
ALK PHOS: 75 U/L (ref 39–117)
ALT: 15 U/L (ref 0–35)
AST: 19 U/L (ref 0–37)
BILIRUBIN INDIRECT: 0.2 mg/dL — AB (ref 0.3–0.9)
Bilirubin, Direct: 0.1 mg/dL (ref 0.0–0.3)
TOTAL PROTEIN: 6.3 g/dL (ref 6.0–8.3)
Total Bilirubin: 0.3 mg/dL (ref 0.3–1.2)

## 2014-07-07 LAB — BASIC METABOLIC PANEL
ANION GAP: 7 (ref 5–15)
BUN: 13 mg/dL (ref 6–23)
CHLORIDE: 106 meq/L (ref 96–112)
CO2: 24 mmol/L (ref 19–32)
CREATININE: 0.9 mg/dL (ref 0.50–1.10)
Calcium: 8.4 mg/dL (ref 8.4–10.5)
GFR calc Af Amer: >90 mL/min (ref >90)
GFR calc non Af Amer: 81 mL/min — ABNORMAL LOW (ref >90)
Glucose, Bld: 108 mg/dL — ABNORMAL HIGH (ref 70–99)
POTASSIUM: 3.8 mmol/L (ref 3.5–5.1)
Sodium: 137 mmol/L (ref 135–145)

## 2014-07-07 LAB — CBC
HCT: 36 % (ref 36.0–46.0)
HEMOGLOBIN: 12.1 g/dL (ref 12.0–15.0)
MCH: 29.2 pg (ref 26.0–34.0)
MCHC: 33.6 g/dL (ref 30.0–36.0)
MCV: 87 fL (ref 78.0–100.0)
PLATELETS: 239 10*3/uL (ref 150–400)
RBC: 4.14 MIL/uL (ref 3.87–5.11)
RDW: 13 % (ref 11.5–15.5)
WBC: 7.9 10*3/uL (ref 4.0–10.5)

## 2014-07-07 LAB — I-STAT TROPONIN, ED: Troponin i, poc: 0 ng/mL (ref 0.00–0.08)

## 2014-07-07 LAB — D-DIMER, QUANTITATIVE (NOT AT ARMC): D DIMER QUANT: 0.4 ug{FEU}/mL (ref 0.00–0.48)

## 2014-07-07 LAB — BRAIN NATRIURETIC PEPTIDE: B NATRIURETIC PEPTIDE 5: 13.1 pg/mL (ref 0.0–100.0)

## 2014-07-07 MED ORDER — ASPIRIN 81 MG PO CHEW
324.0000 mg | CHEWABLE_TABLET | Freq: Once | ORAL | Status: AC
  Administered 2014-07-07: 324 mg via ORAL
  Filled 2014-07-07: qty 4

## 2014-07-07 MED ORDER — KETOROLAC TROMETHAMINE 30 MG/ML IJ SOLN
30.0000 mg | Freq: Once | INTRAMUSCULAR | Status: AC
  Administered 2014-07-07: 30 mg via INTRAVENOUS
  Filled 2014-07-07: qty 1

## 2014-07-07 MED ORDER — IBUPROFEN 600 MG PO TABS: 600.0000 mg | ORAL_TABLET | Freq: Four times a day (QID) | ORAL | Status: DC | PRN

## 2014-07-07 NOTE — ED Provider Notes (Signed)
CSN: [id number]     Arrival date & time 07/07/14  0315 History   First MD Initiated Contact with Patient 07/07/14 0525     Chief Complaint  Patient presents with  . Chest Pain     (Consider location/radiation/quality/duration/timing/severity/associated sxs/prior Treatment) HPI  Linda [name] presents with 10 hours of chest pain. She describes as a sharp chest pain that worsens with sitting up, better with lying flat. Some pleuritic symptoms. Has had a cough since this started. Thought it was related to her asthma and she was also having shortness of breath, however albuterol did not help. Has not had any fevers. He felt pain in her left jaw and became concerned. Did vomit once shortly after the pain started. No exertional symptoms that she knows of. No numbness. 3 weeks ago she had bronchitis that was treated with Levaquin. Has not been sick since. No prior history of leg swelling/leg pain, recent travel, hemoptysis, or prior history of DVT. No hypertension, diabetes, cholesterol issues, or significant family history for coronary disease.  Past Medical History  Diagnosis Date  . Bleeding     POST PARTUM BLEEDING  . Migraine   . Bronchitis with flu 06/12/11  . Heart murmur     while pregnant  . Anxiety   . Asthma    Past Surgical History  Procedure Laterality Date  . Tonsilectomy, adenoidectomy, bilateral myringotomy and tubes  2010  . Endometrial ablation  2012  . Abdominal hysterectomy  12/20012    Total  . Tubal ligation  08/2010  . Tonsillectomy     Family History  Problem Relation Age of Onset  . Hypertension Father   . Diabetes Father   . Hypertension Mother   . Cancer Mother     uterine  . Stroke Mother   . Diabetes Maternal Aunt   . Diabetes Maternal Uncle   . Cancer Maternal Grandmother     PANCREATIC  . Hypertension Maternal Grandmother   . Diabetes Maternal Grandmother   . Cancer Maternal Grandfather     LUNG  . Hypertension Maternal Grandfather   .  Diabetes Maternal Grandfather   . Cancer Paternal Grandmother     THROAT  . Alzheimer's disease Paternal Grandmother    History  Substance Use Topics  . Smoking status: Never Smoker   . Smokeless tobacco: Not on file  . Alcohol Use: 0.5 oz/week    1 Not specified per week     Comment: occasional (not much)   OB History    No data available     Review of Systems  Constitutional: Negative for fever.  Respiratory: Positive for cough and shortness of breath.   Cardiovascular: Positive for chest pain. Negative for leg swelling.  Gastrointestinal: Positive for vomiting. Negative for abdominal pain.  All other systems reviewed and are negative.     Allergies  Review of patient's allergies indicates no known allergies.  Home Medications   Prior to Admission medications   Medication Sig Start Date End Date Taking? Authorizing Provider  albuterol (PROVENTIL HFA;VENTOLIN HFA) 108 (90 BASE) MCG/ACT inhaler Inhale 2 puffs into the lungs every 6 (six) hours as needed for wheezing or shortness of breath.   Yes Historical Provider, MD  ALPRAZolam (XANAX) 0.25 MG tablet Take 1 tablet (0.25 mg total) by mouth 2 (two) times daily as needed for anxiety. 05/23/14  Yes Jearld Fenton, NP  aspirin 325 MG tablet Take 325 mg by mouth every 6 (six) hours as needed for mild  pain.   Yes Historical Provider, MD  Multiple Vitamin (MULTIVITAMIN) tablet Take 1 tablet by mouth daily.   Yes Historical Provider, MD  pantoprazole (PROTONIX) 40 MG tablet Take 1 tablet (40 mg total) by mouth 2 (two) times daily. 06/12/14  Yes Jearld Fenton, NP  topiramate (TOPAMAX) 25 MG tablet Take 25 mg by mouth daily.   Yes Historical Provider, MD  acyclovir ointment (ZOVIRAX) 5 % Apply 1 application topically every 3 (three) hours. Patient not taking: Reported on 07/07/2014 06/25/14   Jearld Fenton, NP  ibuprofen (ADVIL,MOTRIN) 600 MG tablet Take 1 tablet (600 mg total) by mouth every 6 (six) hours as needed for moderate  pain. 07/07/14   Ephraim Hamburger, MD  topiramate (TOPAMAX) 25 MG tablet Take 1 tablet (25 mg total) by mouth 2 (two) times daily. Patient not taking: Reported on 07/07/2014 05/27/14   Dudley Major, DO  zolmitriptan (ZOMIG) 5 MG tablet Take one tablet by mouth at on set of headache can take one more tablet 2 hours later if needed Patient not taking: Reported on 07/07/2014 04/23/14   Dudley Major, DO   BP 112/58 mmHg  Pulse 81  Temp(Src) 98.2 F (36.8 C) (Oral)  Resp 21  SpO2 98%  LMP 09/06/2010 Physical Exam  Constitutional: She is oriented to person, place, and time. She appears well-developed and well-nourished. No distress.  HENT:  Head: Normocephalic and atraumatic.  Right Ear: External ear normal.  Left Ear: External ear normal.  Nose: Nose normal.  Eyes: Right eye exhibits no discharge. Left eye exhibits no discharge.  Cardiovascular: Normal rate, regular rhythm and normal heart sounds.   Pulmonary/Chest: Effort normal and breath sounds normal. She exhibits tenderness (anterior mid and left chest wall tenderness).  Abdominal: Soft. She exhibits no distension. There is no tenderness.  Musculoskeletal: She exhibits no edema or tenderness.  Neurological: She is alert and oriented to person, place, and time.  Skin: Skin is warm and dry. She is not diaphoretic.  Nursing note and vitals reviewed.   ED Course  Procedures (including critical care time) Labs Review Labs Reviewed  HEPATIC FUNCTION PANEL - Abnormal; Notable for the following:    Indirect Bilirubin 0.2 (*)    All other components within normal limits  BASIC METABOLIC PANEL - Abnormal; Notable for the following:    Glucose, Bld 108 (*)    GFR calc non Af Amer 81 (*)    All other components within normal limits  CBC  D-DIMER, QUANTITATIVE  BRAIN NATRIURETIC PEPTIDE  I-STAT TROPOININ, ED    Imaging Review Dg Chest 2 View  07/07/2014   CLINICAL DATA:  Mid chest tightness for 3 hr, RIGHT jawline neck  pain with shortness of breath.  EXAM: CHEST  2 VIEW  COMPARISON:  None.  FINDINGS: Cardiomediastinal silhouette is unremarkable. The lungs are clear without pleural effusions or focal consolidations. Trachea projects midline and there is no pneumothorax. Soft tissue planes and included osseous structures are non-suspicious.  IMPRESSION: No acute cardiopulmonary process.   Electronically Signed   By: Elon Alas   On: 07/07/2014 05:05     EKG Interpretation   Date/Time:  Sunday July 07 2014 03:21:25 EST Ventricular Rate:  76 PR Interval:  134 QRS Duration: 78 QT Interval:  376 QTC Calculation: 423 R Axis:   26 Text Interpretation:  Normal sinus rhythm Cannot rule out Anterior infarct  , age undetermined Abnormal ECG No old tracing to compare Confirmed by  [facility]  MD, Reiss Mowrey [phone]) on 07/07/2014 5:08:02 AM      MDM   Final diagnoses:  Chest pain    Patient's chest pain is atypical given how reproducible it is in the anterior chest wall. Likely an inflammatory component to her pain. A little atypical to be pericarditis given it worsens with sitting up instead of being improved. Patient does have a little bit of a cough could have a chest wall etiology for her pain. X-ray and lab work is unremarkable. EKG shows no ischemia. Her first troponin was taken about 10 hours after an onset of pain. I did suggest 1 second troponin but patient declined, wanting to go home. Feels better with Toradol. At this point my suspicion for ACS is low and she has a low HEART score. Discussed return precautions with patient and she will f/u with PCP or return here if symptoms worsen.    Ephraim Hamburger, MD 07/07/14 [phone]

## 2014-07-07 NOTE — ED Notes (Signed)
MD at bedside. 

## 2014-07-07 NOTE — ED Notes (Signed)
Pt. reports mid chest tightness , right jaw and neck pain onset this evening with SOB and emesis .

## 2014-07-07 NOTE — ED Notes (Signed)
Lab called to inquire about labwork, specifically BNP.

## 2014-07-07 NOTE — Discharge Instructions (Signed)

## 2014-07-13 ENCOUNTER — Other Ambulatory Visit: Payer: Self-pay | Admitting: Internal Medicine

## 2014-07-24 ENCOUNTER — Ambulatory Visit: Payer: Federal, State, Local not specified - PPO | Admitting: Neurology

## 2014-08-19 ENCOUNTER — Other Ambulatory Visit: Payer: Self-pay | Admitting: Internal Medicine

## 2014-08-20 NOTE — Telephone Encounter (Signed)
Last filled 05/23/2014--last OV 06/12/2014--please advise

## 2014-08-20 NOTE — Telephone Encounter (Signed)
Ok to phone in xanax 

## 2014-08-21 NOTE — Telephone Encounter (Signed)
Rx called in to pharmacy. 

## 2014-08-22 ENCOUNTER — Ambulatory Visit (INDEPENDENT_AMBULATORY_CARE_PROVIDER_SITE_OTHER): Payer: Federal, State, Local not specified - PPO | Admitting: Family Medicine

## 2014-08-22 ENCOUNTER — Encounter: Payer: Self-pay | Admitting: Family Medicine

## 2014-08-22 VITALS — BP 108/73 | HR 80 | Temp 98.4°F | Ht 68.0 in | Wt 254.5 lb

## 2014-08-22 DIAGNOSIS — R0609 Other forms of dyspnea: Secondary | ICD-10-CM

## 2014-08-22 DIAGNOSIS — H66002 Acute suppurative otitis media without spontaneous rupture of ear drum, left ear: Secondary | ICD-10-CM

## 2014-08-22 DIAGNOSIS — R079 Chest pain, unspecified: Secondary | ICD-10-CM

## 2014-08-22 MED ORDER — ALBUTEROL SULFATE HFA 108 (90 BASE) MCG/ACT IN AERS: 2.0000 | INHALATION_SPRAY | Freq: Four times a day (QID) | RESPIRATORY_TRACT | Status: DC | PRN

## 2014-08-22 MED ORDER — CIPROFLOXACIN-DEXAMETHASONE 0.3-0.1 % OT SUSP: 4.0000 [drp] | Freq: Two times a day (BID) | OTIC | Status: DC

## 2014-08-22 NOTE — Patient Instructions (Addendum)
Treat left otitis media with ciprodex.  Follow up with ENT given tubes not in correct position and to make sure ear infeciton resolving. EKG normal, chest pain likely costochondritis. Treat with 800 mg ibuprofen every 8 hours.  Stop  at front desk to set up referral for lung function tests. C an use albuterol as needed for shortness of breath.

## 2014-08-22 NOTE — Assessment & Plan Note (Signed)
Treat with ciprodex given tube in draining.. Follow up with ENT for resolution and for further treatmnet given tubes moving and 4 -5 OM in last 2 months.

## 2014-08-22 NOTE — Assessment & Plan Note (Signed)
EKG nml. Likely costochondritis as well as a little anxiety given she is now off celexa. Will treat with NSAIDs and chest wall stretching.

## 2014-08-22 NOTE — Progress Notes (Signed)
Pre visit review using our clinic review tool, if applicable. No additional management support is needed unless otherwise documented below in the visit note. 

## 2014-08-22 NOTE — Assessment & Plan Note (Signed)
Lungs clear. Exercise induced symptoms.. Possible exercise induced asthma.  unable to do spirometry in office today, peak flow slightly low for pt at 305. Will refer for PFTs but will given albuterol to try with exercise prn.

## 2014-08-22 NOTE — Progress Notes (Signed)
Subjective:    Patient ID: Linda [name], female    DOB: 03-02-1976, 39 y.o.   MRN: [id number]  Otalgia  There is pain in the left ear. This is a new problem. The current episode started in the past 7 days. The problem occurs constantly. The problem has been gradually worsening. There has been no fever. The pain is at a severity of 9/10. Pertinent negatives include no ear discharge. Associated symptoms comments: Vertigo  Triggered with head movement and nausea  occ cough, post nasal drip . She has tried NSAIDs for the symptoms. The treatment provided significant (temporary) relief. Her past medical history is significant for a chronic ear infection and a tympanostomy tube. There is no history of hearing loss. ashtma occured when she was pregnant, no isues in last 4 years. Nonsmoker  Shortness of Breath This is a new problem. The current episode started 1 to 4 weeks ago (3-4 weeks). The problem occurs intermittently. The problem has been gradually worsening. Associated symptoms include chest pain. Pertinent negatives include no fever or sputum production. The symptoms are aggravated by exercise (difficulty going up stairs). The patient has no known risk factors for DVT/PE. She has tried nothing for the symptoms. Her past medical history is significant for allergies and asthma. There is no history of chronic lung disease, COPD, DVT, a heart failure, PE, pneumonia or a recent surgery. (Ashtma occured when she was pregnant, no isues in last 4 years. Nonsmoker)  Chest Pain  This is a new problem. The current episode started in the past 7 days (3-4 days). The onset quality is gradual. The problem occurs intermittently. The problem has been waxing and waning. The pain is present in the substernal region. The pain is mild. The quality of the pain is described as squeezing. The pain does not radiate. Associated symptoms include shortness of breath. Pertinent negatives include no dizziness, fever,  malaise/fatigue or sputum production.  Pertinent negatives for past medical history include no DVT and no PE. Past medical history comments: ashtma occured when she was pregnant, no isues in last 4 years. Nonsmoker   HAs been trying to exercsie to lose weight and she cannot without shortness of breaht.   She does have anxiety that makes it worse.     Review of Systems  Constitutional: Negative for fever and malaise/fatigue.  HENT: Negative for ear discharge.   Respiratory: Positive for shortness of breath. Negative for sputum production.   Cardiovascular: Positive for chest pain.  Neurological: Negative for dizziness.       Objective:   Physical Exam  Constitutional: Vital signs are normal. She appears well-developed and well-nourished. She is cooperative.  Non-toxic appearance. She does not appear ill. No distress.  HENT:  Head: Normocephalic.  Right Ear: Hearing, tympanic membrane, external ear and ear canal normal. Tympanic membrane is not erythematous, not retracted and not bulging.  Left Ear: Hearing, tympanic membrane, external ear and ear canal normal. Tympanic membrane is not erythematous, not retracted and not bulging.  Nose: No mucosal edema or rhinorrhea. Right sinus exhibits no maxillary sinus tenderness and no frontal sinus tenderness. Left sinus exhibits no maxillary sinus tenderness and no frontal sinus tenderness.  Mouth/Throat: Uvula is midline, oropharynx is clear and moist and mucous membranes are normal.  B ear tubes , right is covered partially by tiisue and is horizontal, leftt is horizontal and partially out of place but is draining pus, Tm dull on left  Eyes: Conjunctivae, EOM and lids are normal.  Pupils are equal, round, and reactive to light. Lids are everted and swept, no foreign bodies found.  Neck: Trachea normal and normal range of motion. Neck supple. Carotid bruit is not present. No thyroid mass and no thyromegaly present.  Cardiovascular: Normal rate,  regular rhythm, S1 normal, S2 normal, normal heart sounds, intact distal pulses and normal pulses.  Exam reveals no gallop and no friction rub.   No murmur heard. Pulmonary/Chest: Effort normal and breath sounds normal. No tachypnea. No respiratory distress. She has no decreased breath sounds. She has no wheezes. She has no rhonchi. She has no rales. Chest wall is not dull to percussion. She exhibits no tenderness, no bony tenderness, no laceration and no retraction. Right breast exhibits no inverted nipple and no mass. Left breast exhibits no inverted nipple and no mass.    Chest wall ttp over left costochondral junction  Abdominal: Soft. Normal appearance and bowel sounds are normal. There is no tenderness.  Neurological: She is alert.  Skin: Skin is warm, dry and intact. No rash noted.  Psychiatric: Her speech is normal and behavior is normal. Judgment and thought content normal. Her mood appears anxious. Cognition and memory are normal. She does not exhibit a depressed mood.          Assessment & Plan:

## 2014-09-19 ENCOUNTER — Other Ambulatory Visit: Payer: Self-pay | Admitting: Internal Medicine

## 2014-09-20 ENCOUNTER — Other Ambulatory Visit: Payer: Self-pay | Admitting: Internal Medicine

## 2014-09-20 MED ORDER — ALPRAZOLAM 0.25 MG PO TABS: 0.2500 mg | ORAL_TABLET | Freq: Two times a day (BID) | ORAL | Status: DC | PRN

## 2014-09-20 NOTE — Telephone Encounter (Signed)
Rx called in to pharmacy. 

## 2014-11-18 ENCOUNTER — Telehealth: Payer: Self-pay | Admitting: *Deleted

## 2014-11-18 ENCOUNTER — Ambulatory Visit (INDEPENDENT_AMBULATORY_CARE_PROVIDER_SITE_OTHER): Payer: Federal, State, Local not specified - PPO

## 2014-11-18 DIAGNOSIS — G43009 Migraine without aura, not intractable, without status migrainosus: Secondary | ICD-10-CM

## 2014-11-18 MED ORDER — METOCLOPRAMIDE HCL 5 MG/ML IJ SOLN
10.0000 mg | Freq: Once | INTRAVENOUS | Status: AC
  Administered 2014-11-18: 10 mg via INTRAMUSCULAR

## 2014-11-18 MED ORDER — KETOROLAC TROMETHAMINE 60 MG/2ML IM SOLN
60.0000 mg | Freq: Once | INTRAMUSCULAR | Status: AC
  Administered 2014-11-18: 60 mg via INTRAMUSCULAR

## 2014-11-18 MED ORDER — DIPHENHYDRAMINE HCL 50 MG/ML IJ SOLN
25.0000 mg | Freq: Once | INTRAMUSCULAR | Status: AC
  Administered 2014-11-18: 25 mg via INTRAMUSCULAR

## 2014-11-18 NOTE — Telephone Encounter (Signed)
Patient called she has had headache since late Friday evening with vomiting . She has taken zomig  With the topamax and no relief she is sweating and flush from the headache . I have scheduled her for injections.

## 2014-11-18 NOTE — Telephone Encounter (Signed)
Follow up was made

## 2014-11-18 NOTE — Telephone Encounter (Signed)
She needs to make a follow-up appointment as well.

## 2014-12-11 ENCOUNTER — Other Ambulatory Visit: Payer: Self-pay | Admitting: Internal Medicine

## 2014-12-11 ENCOUNTER — Other Ambulatory Visit: Payer: Self-pay

## 2014-12-11 MED ORDER — ALPRAZOLAM 0.25 MG PO TABS: 0.2500 mg | ORAL_TABLET | Freq: Two times a day (BID) | ORAL | Status: DC | PRN

## 2014-12-11 NOTE — Telephone Encounter (Signed)
Rx called in to pharmacy. 

## 2014-12-17 ENCOUNTER — Telehealth: Payer: Self-pay | Admitting: Neurology

## 2014-12-17 ENCOUNTER — Ambulatory Visit: Payer: Federal, State, Local not specified - PPO

## 2014-12-17 NOTE — Telephone Encounter (Signed)
She can come in for a cocktail (Toradol 60mg Lenard Galloway 25mg /Reglan 10mg ).  She should have someone to drive her because it can make her drowsy.

## 2014-12-17 NOTE — Telephone Encounter (Signed)
Pt has called a 2nd time.  / Sherri S.

## 2014-12-17 NOTE — Telephone Encounter (Signed)
Pt has a migraine and she can t sleep or do anything and would like to come in and  get a shot  Please call [phone]

## 2014-12-17 NOTE — Telephone Encounter (Signed)
Patient advised and placed on schedule

## 2014-12-17 NOTE — Telephone Encounter (Signed)
Please advise 

## 2015-01-29 ENCOUNTER — Other Ambulatory Visit: Payer: Self-pay | Admitting: Internal Medicine

## 2015-01-29 NOTE — Telephone Encounter (Signed)
Last filled 12/11/2014--will let pt know she needs to sign a CSA before another refill--please advise

## 2015-01-30 ENCOUNTER — Other Ambulatory Visit: Payer: Self-pay | Admitting: Internal Medicine

## 2015-01-30 MED ORDER — ALPRAZOLAM 0.25 MG PO TABS: 0.2500 mg | ORAL_TABLET | Freq: Two times a day (BID) | ORAL | Status: DC | PRN

## 2015-01-30 NOTE — Telephone Encounter (Signed)
Ok to phone in Xanax 

## 2015-01-30 NOTE — Telephone Encounter (Signed)
Denyse Amass at [facility] request name of provider and DEA # for xanax rx; advised Webb Silversmith NP; and DEA given.

## 2015-01-30 NOTE — Telephone Encounter (Signed)
Rx called in to pharmacy. 

## 2015-02-24 ENCOUNTER — Ambulatory Visit: Payer: Federal, State, Local not specified - PPO | Admitting: Neurology

## 2015-02-27 ENCOUNTER — Encounter: Payer: Self-pay | Admitting: *Deleted

## 2015-03-13 ENCOUNTER — Telehealth: Payer: Self-pay | Admitting: Internal Medicine

## 2015-03-13 ENCOUNTER — Encounter (HOSPITAL_COMMUNITY): Payer: Self-pay

## 2015-03-13 ENCOUNTER — Emergency Department (HOSPITAL_COMMUNITY)
Admission: EM | Admit: 2015-03-13 | Discharge: 2015-03-13 | Disposition: A | Discharge status: Home / Self Care | Payer: Federal, State, Local not specified - PPO | Attending: Emergency Medicine | Admitting: Emergency Medicine

## 2015-03-13 ENCOUNTER — Emergency Department (HOSPITAL_COMMUNITY): Payer: Federal, State, Local not specified - PPO

## 2015-03-13 DIAGNOSIS — R079 Chest pain, unspecified: Secondary | ICD-10-CM | POA: Diagnosis not present

## 2015-03-13 DIAGNOSIS — J45909 Unspecified asthma, uncomplicated: Secondary | ICD-10-CM | POA: Insufficient documentation

## 2015-03-13 DIAGNOSIS — Z79899 Other long term (current) drug therapy: Secondary | ICD-10-CM | POA: Insufficient documentation

## 2015-03-13 DIAGNOSIS — G43909 Migraine, unspecified, not intractable, without status migrainosus: Secondary | ICD-10-CM | POA: Insufficient documentation

## 2015-03-13 DIAGNOSIS — R0600 Dyspnea, unspecified: Secondary | ICD-10-CM | POA: Diagnosis not present

## 2015-03-13 DIAGNOSIS — R51 Headache: Secondary | ICD-10-CM | POA: Diagnosis present

## 2015-03-13 DIAGNOSIS — F419 Anxiety disorder, unspecified: Secondary | ICD-10-CM | POA: Diagnosis not present

## 2015-03-13 DIAGNOSIS — R519 Headache, unspecified: Secondary | ICD-10-CM

## 2015-03-13 DIAGNOSIS — R011 Cardiac murmur, unspecified: Secondary | ICD-10-CM | POA: Insufficient documentation

## 2015-03-13 LAB — BASIC METABOLIC PANEL
Anion gap: 10 (ref 5–15)
BUN: 12 mg/dL (ref 6–20)
CHLORIDE: 104 mmol/L (ref 101–111)
CO2: 22 mmol/L (ref 22–32)
Calcium: 9 mg/dL (ref 8.9–10.3)
Creatinine, Ser: 0.89 mg/dL (ref 0.44–1.00)
GFR calc non Af Amer: >60 mL/min (ref >60)
Glucose, Bld: 135 mg/dL — ABNORMAL HIGH (ref 65–99)
POTASSIUM: 3.7 mmol/L (ref 3.5–5.1)
SODIUM: 136 mmol/L (ref 135–145)

## 2015-03-13 LAB — CBC
HEMATOCRIT: 38.2 % (ref 36.0–46.0)
HEMOGLOBIN: 12.8 g/dL (ref 12.0–15.0)
MCH: 29.6 pg (ref 26.0–34.0)
MCHC: 33.5 g/dL (ref 30.0–36.0)
MCV: 88.4 fL (ref 78.0–100.0)
PLATELETS: 266 10*3/uL (ref 150–400)
RBC: 4.32 MIL/uL (ref 3.87–5.11)
RDW: 13 % (ref 11.5–15.5)
WBC: 8.3 10*3/uL (ref 4.0–10.5)

## 2015-03-13 LAB — I-STAT TROPONIN, ED: Troponin i, poc: 0 ng/mL (ref 0.00–0.08)

## 2015-03-13 MED ORDER — KETOROLAC TROMETHAMINE 30 MG/ML IJ SOLN
30.0000 mg | Freq: Once | INTRAMUSCULAR | Status: AC
  Administered 2015-03-13: 30 mg via INTRAVENOUS
  Filled 2015-03-13: qty 1

## 2015-03-13 MED ORDER — PROCHLORPERAZINE EDISYLATE 5 MG/ML IJ SOLN
10.0000 mg | Freq: Four times a day (QID) | INTRAMUSCULAR | Status: DC | PRN
  Administered 2015-03-13: 10 mg via INTRAVENOUS
  Filled 2015-03-13: qty 2

## 2015-03-13 NOTE — Discharge Instructions (Signed)
General Headache Without Cause A general headache is pain or discomfort felt around the head or neck area. The cause may not be found.  HOME CARE   Keep all doctor visits.  Only take medicines as told by your doctor.  Lie down in a dark, quiet room when you have a headache.  Keep a journal to find out if certain things bring on headaches. For example, write down:  What you eat and drink.  How much sleep you get.  Any change to your diet or medicines.  Relax by getting a massage or doing other relaxing activities.  Put ice or heat packs on the head and neck area as told by your doctor.  Lessen stress.  Sit up straight. Do not tighten (tense) your muscles.  Quit smoking if you smoke.  Lessen how much alcohol you drink.  Lessen how much caffeine you drink, or stop drinking caffeine.  Eat and sleep on a regular schedule.  Get 7 to 9 hours of sleep, or as told by your doctor.  Keep lights dim if bright lights bother you or make your headaches worse. GET HELP RIGHT AWAY IF:   Your headache becomes really bad.  You have a fever.  You have a stiff neck.  You have trouble seeing.  Your muscles are weak, or you lose muscle control.  You lose your balance or have trouble walking.  You feel like you will pass out (faint), or you pass out.  You have really bad symptoms that are different than your first symptoms.  You have problems with the medicines given to you by your doctor.  Your medicines do not work.  Your headache feels different than the other headaches.  You feel sick to your stomach (nauseous) or throw up (vomit). MAKE SURE YOU:   Understand these instructions.  Will watch your condition.  Will get help right away if you are not doing well or get worse. Document Released: 04/06/2008 Document Revised: 09/20/2011 Document Reviewed: 06/18/2011 [facility] Patient Information 2015 [place], [place]. This information is not intended to replace advice given to  you by your health care provider. Make sure you discuss any questions you have with your health care provider. Chest Pain (Nonspecific) It is often hard to give a diagnosis for the cause of chest pain. There is always a chance that your pain could be related to something serious, such as a heart attack or a blood clot in the lungs. You need to follow up with your doctor. HOME CARE  If antibiotic medicine was given, take it as directed by your doctor. Finish the medicine even if you start to feel better.  For the next few days, avoid activities that bring on chest pain. Continue physical activities as told by your doctor.  Do not use any tobacco products. This includes cigarettes, chewing tobacco, and e-cigarettes.  Avoid drinking alcohol.  Only take medicine as told by your doctor.  Follow your doctor's suggestions for more testing if your chest pain does not go away.  Keep all doctor visits you made. GET HELP IF:  Your chest pain does not go away, even after treatment.  You have a rash with blisters on your chest.  You have a fever. GET HELP RIGHT AWAY IF:   You have more pain or pain that spreads to your arm, neck, jaw, back, or belly (abdomen).  You have shortness of breath.  You cough more than usual or cough up blood.  You have very bad  back or belly pain.  You feel sick to your stomach (nauseous) or throw up (vomit).  You have very bad weakness.  You pass out (faint).  You have chills. This is an emergency. Do not wait to see if the problems will go away. Call your local emergency services (911 in [place] Do not drive yourself to the hospital. MAKE SURE YOU:   Understand these instructions.  Will watch your condition.  Will get help right away if you are not doing well or get worse. Document Released: 12/15/2007 Document Revised: 07/03/2013 Document Reviewed: 12/15/2007 [facility] Patient Information 2015 [place], [place]. This information is not intended to  replace advice given to you by your health care provider. Make sure you discuss any questions you have with your health care provider.

## 2015-03-13 NOTE — Telephone Encounter (Signed)
PLEASE NOTE: All timestamps contained within this report are represented as [place] Standard Time. CONFIDENTIALTY NOTICE: This fax transmission is intended only for the addressee. It contains information that is legally privileged, confidential or otherwise protected from use or disclosure. If you are not the intended recipient, you are strictly prohibited from reviewing, disclosing, copying using or disseminating any of this information or taking any action in reliance on or regarding this information. If you have received this fax in error, please notify us immediately by telephone so that we can arrange for its return to [place]. Phone: [phone], Toll-Free: [phone], Fax: [phone] Page: 1 of 2 Call Id: [id number] [place] Patient Name: Linda [name] Gender: Female DOB: August 20, 1975 Age: 39 Y 6 M 6 D Return Phone Number: [phone] (Primary), [phone] (Secondary) Address[place] [address] Client [facility] - Client Client Site [place] - Day Physician [place], [place] Type Call Call Type Triage / Clinical Relationship To Patient Self Appointment Disposition EMR Appointment Not Necessary Info pasted into Epic Yes Return Phone Number [phone] (Primary) Chief Complaint CHEST PAIN (>=21 years) - pain, pressure, heaviness or tightness Initial Comment Caller states, wants an appt, for 2 weeks not feeling well, acid reflux, nausea, tired vomiting, chest hurts all the time pain radiates down her left arm , dizzy , shortness of breath PreDisposition Call Doctor Nurse Assessment Nurse: [name], RN, Amy Date/Time [name] Time): 03/13/2015 12:54:53 PM Confirm and document reason for call. If symptomatic, describe symptoms. ---CALLER STATES THAT SHE HAS BEEN HAVING CONSTANT HEADACHES - THEY ARE NOT MIGRAINES. SHE  STATES IT'S A SHARP PAIN AND GOES AWAY. SHE IS SOB IN NO TIME. SHE STATES THAT HE CHEST HURTS ALL THE TIME. HER CHEST IS POUNDING. SHE STATES THAT HER HEART WILL START FLUTTERING. HER ARM STARTED HURTING AS WELL - LEFT ARM FOR THE PAST 2 DAYS. SHE IS TIRED ALL THE TIME. SHE IS NAUSEATED AND CAN'T KEEP HER FOOD DOWN. SHE STATES THAT SHE FEELS DIFFERENT. Has the patient traveled out of the country within the last 30 days? ---Not Applicable Does the patient require triage? ---Yes Related visit to physician within the last 2 weeks? ---No Does the PT have any chronic conditions? (i.e. diabetes, asthma, etc.) ---No Did the patient indicate they were pregnant? ---No Guidelines Guideline Title Affirmed Question Affirmed Notes Nurse Date/Time (Eastern Time) Chest Pain [1] Intermittent chest pain or "angina" AND [2] increasing in severity or frequency (Exception: CHEST MIDSTERNAL - DIZZINESS, SHARP PAIN IN HEAD, SOB [place], RN, Amy 03/13/2015 12:55:37 PM PLEASE NOTE: All timestamps contained within this report are represented as [place] Standard Time. CONFIDENTIALTY NOTICE: This fax transmission is intended only for the addressee. It contains information that is legally privileged, confidential or otherwise protected from use or disclosure. If you are not the intended recipient, you are strictly prohibited from reviewing, disclosing, copying using or disseminating any of this information or taking any action in reliance on or regarding this information. If you have received this fax in error, please notify us immediately by telephone so that we can arrange for its return to [place]. Phone: [phone], Toll-Free: [phone], Fax: [phone] Page: 2 of 2 Call Id: [id number] Guidelines Guideline Title Affirmed Question Affirmed Notes Nurse Date/Time [name] Time) pains lasting a few seconds) Disp. Time [name] Time) Disposition Final User 03/13/2015 12:49:55 PM Send to Urgent Neta Ehlers 03/13/2015 12:56:46 PM Go  to ED Now Yes [name], RN, Amy Caller Understands: Yes Disagree/Comply: Comply Care Advice Given Per Guideline GO TO ED NOW: You need to be seen in the Emergency Department. Go to the ER at ___________ [place] now. Drive carefully. CARE ADVICE given per Chest Pain (Adult) guideline. After Care Instructions Given Call Event Type User Date / Time Description Referrals [facility] - ED

## 2015-03-13 NOTE — ED Notes (Signed)
Pt c/o intermittent, sharp central and L side chest pain and SOB w/ exertion x 1 week, intermittent sharp head pains x 3 days, and L arm weakness since this morning.  Pain score 6/10.  Hx of migraines.  Sts "it doesn't feel like a migraine."  Pt reports that she woke w/ L arm weakness and last seen normal was 2200 yesterday.

## 2015-03-13 NOTE — Telephone Encounter (Signed)
[facility] Patient Name: Kalandra Wandel DOB: 1976-04-28 Initial Comment Caller states, wants an appt, for 2 weeks not feeling well, acid reflux, nausea, tired vomiting, chest hurts all the time pain radiates down her left arm , dizzy , shortness of breath Nurse Assessment Nurse: [name], RN, Amy Date/Time (Eastern Time): 03/13/2015 12:54:53 PM Confirm and document reason for call. If symptomatic, describe symptoms. ---CALLER STATES THAT SHE HAS BEEN HAVING CONSTANT HEADACHES - THEY ARE NOT MIGRAINES. SHE STATES ITS A SHARP PAIN AND GOES AWAY. SHE IS SOB IN NO TIME. SHE STATES THAT HE CHEST HURTS ALL THE TIME. HER CHEST IS POUNDING. SHE STATES THAT HER HEART WILL START FLUTTERING. HER ARM STARTED HURTING AS WELL - LEFT ARM FOR THE PAST 2 DAYS. SHE IS TIRED ALL THE TIME. SHE IS NAUSEATED AND CANT KEEP HER FOOD DOWN. SHE STATES THAT SHE FEELS DIFFERENT. Has the patient traveled out of the country within the last 30 days? ---Not Applicable Does the patient require triage? ---Yes Related visit to physician within the last 2 weeks? ---No Does the PT have any chronic conditions? (i.e. diabetes, asthma, etc.) ---No Did the patient indicate they were pregnant? ---No Guidelines Guideline Title Affirmed Question Affirmed Notes Chest Pain [1] Intermittent chest pain or "angina" AND [2] increasing in severity or frequency (Exception: pains lasting a few seconds) CHEST MIDSTERNAL - DIZZINESS, SHARP PAIN IN HEAD, SOB Final Disposition User Go to ED Now [place], RN, [facility] - ED Disagree/Comply: Comply

## 2015-03-13 NOTE — Telephone Encounter (Signed)
Pt is at [facility] ED now.

## 2015-03-13 NOTE — ED Provider Notes (Signed)
CSN: [id number]     Arrival date & time 03/13/15  1325 History   First MD Initiated Contact with Patient 03/13/15 1329     Chief Complaint  Patient presents with  . Weakness  . Chest Pain  . Headache     (Consider location/radiation/quality/duration/timing/severity/associated sxs/prior Treatment) HPI  39 year old female comes in today complaining of headache that began several days ago. She states it feels sharper than her usual migraines. She states that she also has had some anterior chest pain that is sharp in nature. She states that she feels like she is short of breath sometimes with this pain. It is intermittent in nature. It is non-pleuritic. Nurse's notes state that she has had some left arm weakness. She describes it to me and some pain with movement. She denies any head injury, chest injury, history of DVT, PE, lateralized leg swelling or pain. She has had some history of chest in the past says is similar. She was last seen and evaluated for this and December here in the emergency department. She has also had a long history of migraine headaches.  Past Medical History  Diagnosis Date  . Bleeding     POST PARTUM BLEEDING  . Migraine   . Bronchitis with flu 06/12/11  . Heart murmur     while pregnant  . Anxiety   . Asthma    Past Surgical History  Procedure Laterality Date  . Tonsilectomy, adenoidectomy, bilateral myringotomy and tubes  2010  . Endometrial ablation  2012  . Abdominal hysterectomy  12/20012    Total  . Tubal ligation  08/2010  . Tonsillectomy     Family History  Problem Relation Age of Onset  . Hypertension Father   . Diabetes Father   . Hypertension Mother   . Cancer Mother     uterine  . Stroke Mother   . Diabetes Maternal Aunt   . Diabetes Maternal Uncle   . Cancer Maternal Grandmother     PANCREATIC  . Hypertension Maternal Grandmother   . Diabetes Maternal Grandmother   . Cancer Maternal Grandfather     LUNG  . Hypertension Maternal  Grandfather   . Diabetes Maternal Grandfather   . Cancer Paternal Grandmother     THROAT  . Alzheimer's disease Paternal Grandmother    Social History  Substance Use Topics  . Smoking status: Never Smoker   . Smokeless tobacco: Never Used  . Alcohol Use: 0.6 oz/week    1 Standard drinks or equivalent per week     Comment: occasional (not much)   OB History    No data available     Review of Systems  All other systems reviewed and are negative.     Allergies  Review of patient's allergies indicates no known allergies.  Home Medications   Prior to Admission medications   Medication Sig Start Date End Date Taking? Authorizing Provider  albuterol (PROVENTIL HFA;VENTOLIN HFA) 108 (90 BASE) MCG/ACT inhaler Inhale 2 puffs into the lungs every 6 (six) hours as needed for wheezing or shortness of breath. 08/22/14  Yes Amy Cletis [place], MD  ALPRAZolam (XANAX) 0.25 MG tablet Take 1 tablet (0.25 mg total) by mouth 2 (two) times daily as needed. Patient taking differently: Take 0.25 mg by mouth 2 (two) times daily as needed for anxiety.  01/30/15  Yes Jearld Fenton, NP  diphenhydrAMINE (SOMINEX) 25 MG tablet Take 25-50 mg by mouth daily as needed for allergies.   Yes Historical Provider, MD  Multiple Vitamin (MULTIVITAMIN) tablet Take 1 tablet by mouth daily.   Yes Historical Provider, MD  topiramate (TOPAMAX) 25 MG tablet Take 25 mg by mouth daily.   Yes Historical Provider, MD  zolmitriptan (ZOMIG) 5 MG tablet Take one tablet by mouth at on set of headache can take one more tablet 2 hours later if needed 04/23/14  Yes Pieter Partridge, DO  zolmitriptan (ZOMIG-ZMT) 5 MG disintegrating tablet Take 5 mg by mouth as needed for migraine.   Yes Historical Provider, MD   BP 122/52 mmHg  Pulse 69  Resp 17  SpO2 98%  LMP 09/06/2010 Physical Exam  Constitutional: She is oriented to person, place, and time. She appears well-developed and well-nourished.  Morbidly obese  HENT:  Head: Normocephalic  and atraumatic.  Right Ear: Tympanic membrane and external ear normal.  Left Ear: Tympanic membrane and external ear normal.  Nose: Nose normal. Right sinus exhibits no maxillary sinus tenderness and no frontal sinus tenderness. Left sinus exhibits no maxillary sinus tenderness and no frontal sinus tenderness.  Eyes: Conjunctivae and EOM are normal. Pupils are equal, round, and reactive to light. Right eye exhibits no nystagmus. Left eye exhibits no nystagmus.  Neck: Normal range of motion. Neck supple.  Cardiovascular: Normal rate, regular rhythm, normal heart sounds and intact distal pulses.   Pulmonary/Chest: Effort normal and breath sounds normal. No respiratory distress. She exhibits no tenderness.  Abdominal: Soft. Bowel sounds are normal. She exhibits no distension and no mass. There is no tenderness.  Musculoskeletal: Normal range of motion. She exhibits no edema or tenderness.  Neurological: She is alert and oriented to person, place, and time. She has normal strength and normal reflexes. No sensory deficit. She displays a negative Romberg sign. GCS eye subscore is 4. GCS verbal subscore is 5. GCS motor subscore is 6.  Reflex Scores:      Tricep reflexes are 2+ on the right side and 2+ on the left side.      Bicep reflexes are 2+ on the right side and 2+ on the left side.      Brachioradialis reflexes are 2+ on the right side and 2+ on the left side.      Patellar reflexes are 2+ on the right side and 2+ on the left side.      Achilles reflexes are 2+ on the right side and 2+ on the left side. Patient with normal gait without ataxia, shuffling, spasm, or antalgia. Speech is normal without dysarthria, dysphasia, or aphasia. Muscle strength is 5/5 in bilateral shoulders, elbow flexor and extensors, wrist flexor and extensors, and intrinsic hand muscles. 5/5 bilateral lower extremity hip flexors, extensors, knee flexors and extensors, and ankle dorsi and plantar flexors.    Skin: Skin is  warm and dry. No rash noted.  Psychiatric: She has a normal mood and affect. Her behavior is normal. Judgment and thought content normal.  Nursing note and vitals reviewed.   ED Course  Procedures (including critical care time) Labs Review Labs Reviewed  BASIC METABOLIC PANEL - Abnormal; Notable for the following:    Glucose, Bld 135 (*)    All other components within normal limits  CBC  I-STAT TROPOININ, ED    Imaging Review Dg Chest 2 View  03/13/2015   CLINICAL DATA:  Intermittent left-sided chest pain and shortness of breath with exertion for 1 week.  EXAM: CHEST  2 VIEW  COMPARISON:  PA and lateral chest 07/07/2014.  FINDINGS: Heart size and mediastinal contours  are within normal limits. Both lungs are clear. Visualized skeletal structures are unremarkable.  IMPRESSION: Negative exam.   Electronically Signed   By: Inge Rise M.D.   On: 03/13/2015 14:05   Ct Head Wo Contrast  03/13/2015   CLINICAL DATA:  39 year old with current history of migraine headaches, presenting with a 3 day history of intermittent sharp headaches which she describes as different from her typical migraine. Acute onset of left upper extremity weakness earlier this morning.  EXAM: CT HEAD WITHOUT CONTRAST  TECHNIQUE: Contiguous axial images were obtained from the base of the skull through the vertex without intravenous contrast.  COMPARISON:  05/31/2013, 11/19/2007, 07/30/2007.  FINDINGS: Ventricular system normal in size and appearance for age. No mass lesion. No midline shift. No acute hemorrhage or hematoma. No extra-axial fluid collections. No evidence of acute infarction. No focal brain parenchymal abnormalities. No significant interval change.  No focal osseous abnormalities involving the skull. Visualized paranasal sinuses, bilateral mastoid air cells, and bilateral middle ear cavities well-aerated.  IMPRESSION: Normal and stable examination.   Electronically Signed   By: Evangeline Dakin M.D.   On:  03/13/2015 14:16   I have personally reviewed and evaluated these images and lab results as part of my medical decision-making.   EKG Interpretation   Date/Time:  Thursday March 13 2015 13:37:04 EDT Ventricular Rate:  80 PR Interval:  128 QRS Duration: 90 QT Interval:  376 QTC Calculation: 434 R Axis:   45 Text Interpretation:  Normal sinus rhythm No significant change since  Confirmed by Matvey Llanas MD, Andee Poles ([id number]) on 03/13/2015 2:49:51 PM      MDM   Final diagnoses:  Acute nonintractable headache, unspecified headache type  Chest pain, unspecified chest pain type  Dyspnea     Patient improve with all symptoms resolved after compazine and toradol.   Pattricia Boss, MD 03/13/15 [phone]

## 2015-04-06 ENCOUNTER — Other Ambulatory Visit: Payer: Self-pay | Admitting: Internal Medicine

## 2015-04-30 ENCOUNTER — Other Ambulatory Visit: Payer: Self-pay

## 2015-04-30 ENCOUNTER — Ambulatory Visit (INDEPENDENT_AMBULATORY_CARE_PROVIDER_SITE_OTHER): Payer: Federal, State, Local not specified - PPO | Admitting: Internal Medicine

## 2015-04-30 ENCOUNTER — Encounter: Payer: Self-pay | Admitting: Internal Medicine

## 2015-04-30 VITALS — BP 124/88 | HR 80 | Temp 98.1°F | Wt 253.0 lb

## 2015-04-30 DIAGNOSIS — J329 Chronic sinusitis, unspecified: Secondary | ICD-10-CM

## 2015-04-30 DIAGNOSIS — F411 Generalized anxiety disorder: Secondary | ICD-10-CM

## 2015-04-30 DIAGNOSIS — B349 Viral infection, unspecified: Secondary | ICD-10-CM

## 2015-04-30 DIAGNOSIS — B9789 Other viral agents as the cause of diseases classified elsewhere: Secondary | ICD-10-CM

## 2015-04-30 MED ORDER — ALPRAZOLAM 0.25 MG PO TABS: 0.2500 mg | ORAL_TABLET | Freq: Two times a day (BID) | ORAL | Status: DC | PRN

## 2015-04-30 MED ORDER — AMOXICILLIN 875 MG PO TABS: 875.0000 mg | ORAL_TABLET | Freq: Two times a day (BID) | ORAL | Status: DC

## 2015-04-30 NOTE — Progress Notes (Signed)
Pre visit review using our clinic review tool, if applicable. No additional management support is needed unless otherwise documented below in the visit note. 

## 2015-04-30 NOTE — Progress Notes (Signed)
HPI  Pt presents to the clinic today with c/o headache, facial pain and pressure, nasal congestion, sore throat and cough. This started 4 days ago. The cough is productive of green mucous. She is not blowing anything out of her nose. She is having difficulty swallowing. She denies fever, chills or body aches. She has taken Mucinex, Delsym and Nuquil without any relief. She has no history of allergies but does reports asthma. She has had contacts with pt's with similar symptoms.   She is also requesting a refill of her Xanax today. She reports she takes it about once per week.  Review of Systems    Past Medical History  Diagnosis Date  . Bleeding     POST PARTUM BLEEDING  . Migraine   . Bronchitis with flu 06/12/11  . Heart murmur     while pregnant  . Anxiety   . Asthma     Family History  Problem Relation Age of Onset  . Hypertension Father   . Diabetes Father   . Hypertension Mother   . Cancer Mother     uterine  . Stroke Mother   . Diabetes Maternal Aunt   . Diabetes Maternal Uncle   . Cancer Maternal Grandmother     PANCREATIC  . Hypertension Maternal Grandmother   . Diabetes Maternal Grandmother   . Cancer Maternal Grandfather     LUNG  . Hypertension Maternal Grandfather   . Diabetes Maternal Grandfather   . Cancer Paternal Grandmother     THROAT  . Alzheimer's disease Paternal Grandmother     Social History   Social History  . Marital Status: Divorced    Spouse Name: N/A  . Number of Children: N/A  . Years of Education: N/A   Occupational History  . Not on file.   Social History Main Topics  . Smoking status: Never Smoker   . Smokeless tobacco: Never Used  . Alcohol Use: 0.6 oz/week    1 Standard drinks or equivalent per week     Comment: occasional (not much)  . Drug Use: No  . Sexual Activity: Not on file   Other Topics Concern  . Not on file   Social History Narrative    No Known Allergies   Constitutional: Positive headache, fatigue  and fever. Denies abrupt weight changes.  HEENT:  Positive eye pain, facial pain, nasal congestion and sore throat. Denies eye redness, ear pain, ringing in the ears, wax buildup, runny nose or bloody nose. Respiratory: Positive cough. Denies difficulty breathing or shortness of breath.  Cardiovascular: Denies chest pain, chest tightness, palpitations or swelling in the hands or feet.  Psych: Pt reports anxiety. Denies depression, SI/HI.  No other specific complaints in a complete review of systems (except as listed in HPI above).  Objective:  BP 124/88 mmHg  Pulse 80  Temp(Src) 98.1 F (36.7 C) (Oral)  Wt 253 lb (114.76 kg)  SpO2 98%  LMP 09/06/2010   General: Appears her stated age, ill appearing in NAD. HEENT: Head: normal shape and size, maxillary sinus tenderness noted; Eyes: sclera white, no icterus, conjunctiva pink; Ears: Tm's gray and intact, normal light reflex, T tubes noted bilaterally; Nose: mucosa boggy and moist, septum midline; Throat/Mouth: + PND. Teeth present, mucosa erythematous and moist, no exudate noted, no lesions or ulcerations noted.  Neck:  Cervical adenopathy noted. Cardiovascular: Normal rate and rhythm. S1,S2 noted.  No murmur, rubs or gallops noted.  Pulmonary/Chest: Normal effort and positive vesicular breath sounds. No respiratory  distress. No wheezes, rales or ronchi noted.  Psych: Mood and affect normal.    Assessment & Plan:   Viral sinusitis  Can use a Neti Pot which can be purchased from your local drug store. Flonase 2 sprays each nostril for 3 days and then as needed. Start taking Zyrtec daily for the next 1-2 weeks Printed RX for Amoxil BID for 10 days if symptoms worsen over the weekend  Anxiety:  Xanax refilled today  RTC as needed or if symptoms persist.

## 2015-04-30 NOTE — Patient Instructions (Signed)

## 2015-05-01 ENCOUNTER — Other Ambulatory Visit: Payer: Self-pay

## 2015-05-01 MED ORDER — ZOLMITRIPTAN 5 MG PO TABS: ORAL_TABLET | ORAL | Status: DC

## 2015-05-01 NOTE — Telephone Encounter (Signed)
Pt last seen on 04/23/14

## 2015-05-04 ENCOUNTER — Encounter: Payer: Self-pay | Admitting: Internal Medicine

## 2015-05-05 ENCOUNTER — Other Ambulatory Visit: Payer: Self-pay | Admitting: Internal Medicine

## 2015-05-05 MED ORDER — FLUCONAZOLE 150 MG PO TABS: 150.0000 mg | ORAL_TABLET | Freq: Once | ORAL | Status: DC

## 2015-05-05 MED ORDER — ACYCLOVIR 5 % EX OINT: 1.0000 "application " | TOPICAL_OINTMENT | CUTANEOUS | Status: DC

## 2015-06-09 ENCOUNTER — Other Ambulatory Visit: Payer: Self-pay | Admitting: Internal Medicine

## 2015-06-10 MED ORDER — ALPRAZOLAM 0.25 MG PO TABS: 0.2500 mg | ORAL_TABLET | Freq: Two times a day (BID) | ORAL | Status: DC | PRN

## 2015-06-10 NOTE — Telephone Encounter (Signed)
Last filled 04/30/2015--please advise

## 2015-06-10 NOTE — Telephone Encounter (Signed)
Rx called in to pharmacy. 

## 2015-06-10 NOTE — Telephone Encounter (Signed)
Ok to phone in Xanax 

## 2015-07-23 ENCOUNTER — Other Ambulatory Visit: Payer: Self-pay | Admitting: Neurology

## 2015-07-23 ENCOUNTER — Other Ambulatory Visit: Payer: Self-pay | Admitting: Internal Medicine

## 2015-07-23 MED ORDER — ALPRAZOLAM 0.25 MG PO TABS: 0.2500 mg | ORAL_TABLET | Freq: Two times a day (BID) | ORAL | Status: DC | PRN

## 2015-07-23 NOTE — Telephone Encounter (Signed)
Ok to phone in Xanax 

## 2015-07-23 NOTE — Telephone Encounter (Signed)
Last office visit 04/30/2015.  Last refilled 06/10/2015 for #20 with no refills.  Ok to refill?

## 2015-07-24 NOTE — Telephone Encounter (Signed)
Patient has not been seen since 04/2014. No showed next appointment. Letter was mailed to pt on 02/26/14. Rx denied.

## 2015-07-24 NOTE — Telephone Encounter (Signed)
Rx called in to pharmacy. 

## 2015-07-29 ENCOUNTER — Ambulatory Visit (INDEPENDENT_AMBULATORY_CARE_PROVIDER_SITE_OTHER): Payer: Federal, State, Local not specified - PPO | Admitting: Emergency Medicine

## 2015-07-29 VITALS — BP 124/88 | HR 82 | Temp 98.6°F | Resp 18 | Ht 68.0 in | Wt 262.4 lb

## 2015-07-29 DIAGNOSIS — Z23 Encounter for immunization: Secondary | ICD-10-CM | POA: Diagnosis not present

## 2015-07-29 DIAGNOSIS — H66005 Acute suppurative otitis media without spontaneous rupture of ear drum, recurrent, left ear: Secondary | ICD-10-CM | POA: Diagnosis not present

## 2015-07-29 MED ORDER — PSEUDOEPHEDRINE-GUAIFENESIN ER 60-600 MG PO TB12: 1.0000 | ORAL_TABLET | Freq: Two times a day (BID) | ORAL | Status: DC

## 2015-07-29 MED ORDER — AMOXICILLIN-POT CLAVULANATE 875-125 MG PO TABS: 1.0000 | ORAL_TABLET | Freq: Two times a day (BID) | ORAL | Status: DC

## 2015-07-29 MED ORDER — HYDROCODONE-ACETAMINOPHEN 5-325 MG PO TABS: 1.0000 | ORAL_TABLET | ORAL | Status: DC | PRN

## 2015-07-29 NOTE — Patient Instructions (Signed)

## 2015-07-29 NOTE — Progress Notes (Signed)
Subjective:  Patient ID: Linda [name], female    DOB: [birth year]  Age: 40 y.o. MRN: [name]  CC: Otalgia and Flu Vaccine   HPI Linda [name] presents   Patient has a painful left ear with purulent drainage. She denies any fever chills nausea vomiting cough sore throat or other complaints. Is been unable to control symptoms with her usual over-the-counter medication. He did have bilateral myringotomy tubes which were closed but not removed.  History Linda [name] has a past medical history of Bleeding; Migraine; Bronchitis with flu (06/12/11); Heart murmur; Anxiety; and Asthma.   She has past surgical history that includes Tonsilectomy, adenoidectomy, bilateral myringotomy and tubes (2010); Endometrial ablation (2012); Abdominal hysterectomy (12/20012); Tubal ligation (08/2010); and Tonsillectomy.   Her  family history includes Alzheimer's disease in her paternal grandmother; Cancer in her maternal grandfather, maternal grandmother, mother, and paternal grandmother; Diabetes in her father, maternal aunt, maternal grandfather, maternal grandmother, and maternal uncle; Hypertension in her father, maternal grandfather, maternal grandmother, and mother; Stroke in her mother.  She   reports that she has never smoked. She has never used smokeless tobacco. She reports that she drinks about 0.6 oz of alcohol per week. She reports that she does not use illicit drugs.  Outpatient Prescriptions Prior to Visit  Medication Sig Dispense Refill  . albuterol (PROVENTIL HFA;VENTOLIN HFA) 108 (90 BASE) MCG/ACT inhaler Inhale 2 puffs into the lungs every 6 (six) hours as needed for wheezing or shortness of breath. 1 Inhaler 2  . ALPRAZolam (XANAX) 0.25 MG tablet Take 1 tablet (0.25 mg total) by mouth 2 (two) times daily as needed. 20 tablet 0  . Multiple Vitamin (MULTIVITAMIN) tablet Take 1 tablet by mouth daily.    Linda [name] Kitchen zolmitriptan (ZOMIG) 5 MG tablet Take one tablet by mouth at on set of headache can take  one more tablet 2 hours later if needed 10 tablet 0  . citalopram (CELEXA) 10 MG tablet TAKE ONE TABLET BY MOUTH ONE TIME DAILY (Patient not taking: Reported on 07/29/2015) 30 tablet 2  . diphenhydrAMINE (SOMINEX) 25 MG tablet Take 25-50 mg by mouth daily as needed for allergies. Reported on 07/29/2015    . topiramate (TOPAMAX) 25 MG tablet Take 25 mg by mouth daily. Reported on 07/29/2015    . acyclovir ointment (ZOVIRAX) 5 % Apply 1 application topically every 3 (three) hours. 15 g 0  . amoxicillin (AMOXIL) 875 MG tablet Take 1 tablet (875 mg total) by mouth 2 (two) times daily. 20 tablet 0  . fluconazole (DIFLUCAN) 150 MG tablet Take 1 tablet (150 mg total) by mouth once. 1 tablet 1   No facility-administered medications prior to visit.    Social History   Social History  . Marital Status: Divorced    Spouse Name: N/A  . Number of Children: N/A  . Years of Education: N/A   Social History Main Topics  . Smoking status: Never Smoker   . Smokeless tobacco: Never Used  . Alcohol Use: 0.6 oz/week    1 Standard drinks or equivalent per week     Comment: occasional (not much)  . Drug Use: No  . Sexual Activity: Not Asked   Other Topics Concern  . None   Social History Narrative     Review of Systems  Constitutional: Negative for fever, chills and appetite change.  HENT: Positive for ear discharge and ear pain. Negative for congestion, postnasal drip, sinus pressure and sore throat.   Eyes: Negative for pain and redness.  Respiratory: Negative for cough, shortness of breath and wheezing.   Cardiovascular: Negative for leg swelling.  Gastrointestinal: Negative for nausea, vomiting, abdominal pain, diarrhea, constipation and blood in stool.  Endocrine: Negative for polyuria.  Genitourinary: Negative for dysuria, urgency, frequency and flank pain.  Musculoskeletal: Negative for gait problem.  Skin: Negative for rash.  Neurological: Negative for weakness and headaches.    Psychiatric/Behavioral: Negative for confusion and decreased concentration. The patient is not nervous/anxious.     Objective:  BP 124/88 mmHg  Pulse 82  Temp(Src) 98.6 F (37 C) (Oral)  Resp 18  Ht 5\' 8"  (1.727 m)  Wt 262 lb 6.4 oz (119.024 kg)  BMI 39.91 kg/m2  SpO2 98%  LMP 09/06/2010  Physical Exam  Constitutional: She is oriented to person, place, and time. She appears well-developed and well-nourished.  HENT:  Head: Normocephalic and atraumatic.  Left Ear: There is drainage and tenderness.  Eyes: Conjunctivae are normal. Pupils are equal, round, and reactive to light.  Pulmonary/Chest: Effort normal.  Musculoskeletal: She exhibits no edema.  Neurological: She is alert and oriented to person, place, and time.  Skin: Skin is dry.  Psychiatric: She has a normal mood and affect. Her behavior is normal. Thought content normal.      Assessment & Plan:   Linda [name] was seen today for otalgia and flu vaccine.  Diagnoses and all orders for this visit:  Recurrent acute suppurative otitis media without spontaneous rupture of left tympanic membrane  Needs flu shot -     Flu Vaccine QUAD 36+ mos IM  Other orders -     amoxicillin-clavulanate (AUGMENTIN) 875-125 MG tablet; Take 1 tablet by mouth 2 (two) times daily. -     pseudoephedrine-guaifenesin (MUCINEX D) 60-600 MG 12 hr tablet; Take 1 tablet by mouth every 12 (twelve) hours. -     HYDROcodone-acetaminophen (NORCO) 5-325 MG tablet; Take 1-2 tablets by mouth every 4 (four) hours as needed.  I have discontinued Linda [name]'s amoxicillin, fluconazole, and acyclovir ointment. I am also having her start on amoxicillin-clavulanate, pseudoephedrine-guaifenesin, and HYDROcodone-acetaminophen. Additionally, I am having her maintain her multivitamin, topiramate, albuterol, diphenhydrAMINE, citalopram, zolmitriptan, and ALPRAZolam.  Meds ordered this encounter  Medications  . amoxicillin-clavulanate (AUGMENTIN) 875-125 MG tablet     Sig: Take 1 tablet by mouth 2 (two) times daily.    Dispense:  20 tablet    Refill:  0  . pseudoephedrine-guaifenesin (MUCINEX D) 60-600 MG 12 hr tablet    Sig: Take 1 tablet by mouth every 12 (twelve) hours.    Dispense:  18 tablet    Refill:  0  . HYDROcodone-acetaminophen (NORCO) 5-325 MG tablet    Sig: Take 1-2 tablets by mouth every 4 (four) hours as needed.    Dispense:  30 tablet    Refill:  0    Appropriate red flag conditions were discussed with the patient as well as actions that should be taken.  Patient expressed his understanding.  Follow-up: Return if symptoms worsen or fail to improve.  Roselee Culver, MD

## 2015-07-30 ENCOUNTER — Telehealth: Payer: Self-pay

## 2015-07-30 NOTE — Telephone Encounter (Signed)
Patient called after hours and spoke to answering service. Needs refill on Zomig. Has not been seen since 04/23/14. Since that time she has cancelled 1 appointment and no showed twice. Letter was sent to have patient schedule appointment. Last refill sent to pharmacy with note that patient needs an appointment for further refills (05/01/15). Pt did not set up appointment until 07/29/15, scheduled for 12/01/15. Spoke with [name] agreed that refill not appropriate as pt not seen in over 1 year, and noncompliant.

## 2015-07-31 ENCOUNTER — Telehealth: Payer: Self-pay

## 2015-07-31 NOTE — Telephone Encounter (Signed)
° °  Diflucan  - [facility] [place] [address].   Wants to talk with a provider regarding the drops and also to see an ENT for a hole in her ear.   Draining blood.   [phone]

## 2015-07-31 NOTE — Telephone Encounter (Signed)
Patient is calling to follow up on previous message 

## 2015-08-01 ENCOUNTER — Other Ambulatory Visit: Payer: Self-pay | Admitting: Physician Assistant

## 2015-08-01 DIAGNOSIS — H9209 Otalgia, unspecified ear: Secondary | ICD-10-CM

## 2015-08-01 MED ORDER — IBUPROFEN 800 MG PO TABS: 800.0000 mg | ORAL_TABLET | Freq: Three times a day (TID) | ORAL | Status: DC | PRN

## 2015-08-01 MED ORDER — FLUCONAZOLE 150 MG PO TABS: 150.0000 mg | ORAL_TABLET | Freq: Once | ORAL | Status: DC

## 2015-08-01 MED ORDER — NEOMYCIN-POLYMYXIN-HC 3.5-10000-1 OT SOLN: OTIC | Status: DC

## 2015-08-01 NOTE — Telephone Encounter (Signed)
SPoke with pt, she states she would like an ear drop. SHe was offered one at the office visit but refused. Can she get an ear drop? Cost effective. She states she has pus and blood out of her ear and having a lot of pain. SHe is willing to go to ENT. She also states the Hydrocodone makes her nauseous. Can she have 800mg   Ibuprofen instead?

## 2015-08-01 NOTE — Telephone Encounter (Signed)
Sent in Rx for yeast infection. Left message for pt to call back to get more information on ENT.

## 2015-08-01 NOTE — Telephone Encounter (Signed)
Pt.notified

## 2015-08-01 NOTE — Telephone Encounter (Signed)
Her creatinine is normal and she does not take meds for GERD.  Advise 600-800 mg of Ibuprofen q8.  I send in ear drops.  RTC in 48 hours if no improvement. Philis Fendt, MS, PA-C 12:27 PM, 08/01/2015

## 2015-08-03 ENCOUNTER — Telehealth: Payer: Self-pay

## 2015-08-03 NOTE — Telephone Encounter (Signed)
Patient request for a refill of diflucan. [facility] [address]. [phone].

## 2015-08-04 ENCOUNTER — Other Ambulatory Visit: Payer: Self-pay

## 2015-08-04 ENCOUNTER — Ambulatory Visit (INDEPENDENT_AMBULATORY_CARE_PROVIDER_SITE_OTHER): Payer: Federal, State, Local not specified - PPO | Admitting: Neurology

## 2015-08-04 ENCOUNTER — Encounter: Payer: Self-pay | Admitting: Neurology

## 2015-08-04 ENCOUNTER — Other Ambulatory Visit: Payer: Self-pay | Admitting: Physician Assistant

## 2015-08-04 VITALS — BP 124/70 | HR 74 | Ht 68.0 in | Wt 250.0 lb

## 2015-08-04 DIAGNOSIS — Z720 Tobacco use: Secondary | ICD-10-CM | POA: Insufficient documentation

## 2015-08-04 DIAGNOSIS — G43009 Migraine without aura, not intractable, without status migrainosus: Secondary | ICD-10-CM | POA: Diagnosis not present

## 2015-08-04 MED ORDER — ZONISAMIDE 100 MG PO CAPS: 100.0000 mg | ORAL_CAPSULE | Freq: Every day | ORAL | Status: DC

## 2015-08-04 MED ORDER — NAPROXEN 500 MG PO TABS: 500.0000 mg | ORAL_TABLET | Freq: Two times a day (BID) | ORAL | Status: DC | PRN

## 2015-08-04 MED ORDER — FLUCONAZOLE 150 MG PO TABS: 150.0000 mg | ORAL_TABLET | Freq: Once | ORAL | Status: DC

## 2015-08-04 MED ORDER — ZOLMITRIPTAN 5 MG PO TABS: ORAL_TABLET | ORAL | Status: DC

## 2015-08-04 NOTE — Telephone Encounter (Signed)
Spoke with pt, she usually needed two to get rid of her yeast infection. Sent in 2.

## 2015-08-04 NOTE — Patient Instructions (Signed)
1.  Start zonisamide 100mg  at bedtime 2.  Take Zomig 5mg  with naproxen 500mg  at earliest onset of headache.  May repeat dose in 2 hours if needed.  Do not exceed two tablets in 24 hours. 3.  Follow up in 3 months

## 2015-08-04 NOTE — Progress Notes (Signed)
NEUROLOGY FOLLOW UP OFFICE NOTE  SHADAVA CLARIZIO [id number]  HISTORY OF PRESENT ILLNESS: Linda [name] is a 40 year old right-handed woman with history of hysterectomy, migraine and panic attacks who follows up for migraine.  UPDATE: Intensity:  10/10 Duration:  1 day Frequency:  3 to 4 days per month Current abortive medication:  Zomig 5mg  po Antihypertensive medications:  none Antidepressant medications:  none Anticonvulsant medications:  None.  Stopped Topiramate due to side effects. Vitamins/Herbal/Supplements:  MVI Other therapy:  none Other medication:  alprazolam, pantoprazole  HISTORY: Onset:  Around age 72 Location:  Varies.  Either temple, bi-frontal, sometimes radiating down the right side of neck Quality:  Migraines are severe hammer.  Daily headaches are dull throbbing Initial Intensity:  Migraines are 10/10.  Daily headaches 3/10 Aura:  no Prodrome:  no Associated symptoms:  Nausea, phonophobia, osmophobia, sometimes sees spots, sometimes vomiting.   Initial Duration:  3-5 days Initial Frequency:  Once a month (but has daily headache) Triggers/exacerbating factors:  Wine, hunger, scents Relieving factors:  none Activity:  Cannot function with migraines  Past abortive therapy:  Fioricet, Maxalt 5mg  (initially helpful), Relpax (ineffective), sumatriptan 50mg  po (caused head burning), Exedrin, ibuprofen, naproxen, Midrin Past preventative therapy:  Topamax 75mg  (helpful but was taken off it several years ago for unknown reason.  Denied any major side effects or renal problems).  Current abortive therapy:  none Current preventative therapy:  none Other medications:  citalopram 10mg  (just started 6 days ago)  Caffeine:  Tea every other day Alcohol:  rarely  Smoker:  no Diet:  Poor.  Stays hydrated Exercise:  no Depression/stress:  She has been experiencing panic attacks.  Just started citalopram. Sleep hygiene:  Poor.  Prolonged sleep latency.  Wakes up  often.  About 3-4 hours per night Family history of headache:  no  She had a CT of the head performed on 09/08/06 for headache, which was reviewed, which was unremarkable.  It did reveal a punctate calcification along the right transverse sinus, within normal limits, and is showed some fluid in the mastoid air cells bilaterally.  PAST MEDICAL HISTORY: Past Medical History  Diagnosis Date  . Bleeding     POST PARTUM BLEEDING  . Migraine   . Bronchitis with flu 06/12/11  . Heart murmur     while pregnant  . Anxiety   . Asthma     MEDICATIONS: Current Outpatient Prescriptions on File Prior to Visit  Medication Sig Dispense Refill  . albuterol (PROVENTIL HFA;VENTOLIN HFA) 108 (90 BASE) MCG/ACT inhaler Inhale 2 puffs into the lungs every 6 (six) hours as needed for wheezing or shortness of breath. 1 Inhaler 2  . ALPRAZolam (XANAX) 0.25 MG tablet Take 1 tablet (0.25 mg total) by mouth 2 (two) times daily as needed. 20 tablet 0  . amoxicillin-clavulanate (AUGMENTIN) 875-125 MG tablet Take 1 tablet by mouth 2 (two) times daily. 20 tablet 0  . ibuprofen (ADVIL,MOTRIN) 800 MG tablet Take 1 tablet (800 mg total) by mouth every 8 (eight) hours as needed. 30 tablet 0  . Multiple Vitamin (MULTIVITAMIN) tablet Take 1 tablet by mouth daily.    Marland Kitchen topiramate (TOPAMAX) 25 MG tablet Take 25 mg by mouth daily. Reported on 08/04/2015     No current facility-administered medications on file prior to visit.    ALLERGIES: No Known Allergies  FAMILY HISTORY: Family History  Problem Relation Age of Onset  . Hypertension Father   . Diabetes Father   . Hypertension  Mother   . Cancer Mother     uterine  . Stroke Mother   . Diabetes Maternal Aunt   . Diabetes Maternal Uncle   . Cancer Maternal Grandmother     PANCREATIC  . Hypertension Maternal Grandmother   . Diabetes Maternal Grandmother   . Cancer Maternal Grandfather     LUNG  . Hypertension Maternal Grandfather   . Diabetes Maternal  Grandfather   . Cancer Paternal Grandmother     THROAT  . Alzheimer's disease Paternal Grandmother     SOCIAL HISTORY: Social History   Social History  . Marital Status: Divorced    Spouse Name: N/A  . Number of Children: N/A  . Years of Education: N/A   Occupational History  . Not on file.   Social History Main Topics  . Smoking status: Current Every Day Smoker -- 0.00 packs/day  . Smokeless tobacco: Never Used  . Alcohol Use: 0.6 oz/week    1 Standard drinks or equivalent per week     Comment: occasional (not much)  . Drug Use: No  . Sexual Activity: Not on file   Other Topics Concern  . Not on file   Social History Narrative    REVIEW OF SYSTEMS: Constitutional: No fevers, chills, or sweats, no generalized fatigue, change in appetite Eyes: ear infection Ear, nose and throat: No hearing loss, ear pain, nasal congestion, sore throat Cardiovascular: No chest pain, palpitations Respiratory:  No shortness of breath at rest or with exertion, wheezes GastrointestinaI: No nausea, vomiting, diarrhea, abdominal pain, fecal incontinence Genitourinary:  No dysuria, urinary retention or frequency Musculoskeletal:  No neck pain, back pain Integumentary: No rash, pruritus, skin lesions Neurological: as above Psychiatric: No depression, insomnia, anxiety Endocrine: No palpitations, fatigue, diaphoresis, mood swings, change in appetite, change in weight, increased thirst Hematologic/Lymphatic:  No anemia, purpura, petechiae. Allergic/Immunologic: no itchy/runny eyes, nasal congestion, recent allergic reactions, rashes  PHYSICAL EXAM: Filed Vitals:   08/04/15 0929  BP: 124/70  Pulse: 74   General: No acute distress.  Patient appears well-groomed.   Head:  Normocephalic/atraumatic Eyes:  Fundoscopic exam unremarkable without vessel changes, exudates, hemorrhages or papilledema. Neck: supple, no paraspinal tenderness, full range of motion Heart:  Regular rate and  rhythm Lungs:  Clear to auscultation bilaterally Back: No paraspinal tenderness Neurological Exam: alert and oriented to person, place, and time. Attention span and concentration intact, recent and remote memory intact, fund of knowledge intact.  Speech fluent and not dysarthric, language intact.  CN II-XII intact. Fundoscopic exam unremarkable without vessel changes, exudates, hemorrhages or papilledema.  Bulk and tone normal, muscle strength 5/5 throughout.  Sensation to light touch, temperature and vibration intact.  Deep tendon reflexes 2+ throughout, toes downgoing.  Finger to nose and heel to shin testing intact.  Gait normal, Romberg negative.  IMPRESSION: Migraine without aura Tobacco use  PLAN: 1.  Instead, will start zonisamide 100mg  at bedtime 2.  For abortive therapy, she will try naproxen 500mg  with each dose of Zomig 5mg  tablet. 3.  Follow up in May as already scheduled. 4.  Smoking cessation 15 minutes spent face to face with patient, over 50% spent discussing management.   Metta Clines, DO  CC:  Webb Silversmith, NP

## 2015-08-22 ENCOUNTER — Other Ambulatory Visit: Payer: Self-pay | Admitting: Internal Medicine

## 2015-08-22 ENCOUNTER — Other Ambulatory Visit: Payer: Self-pay | Admitting: Physician Assistant

## 2015-08-23 MED ORDER — ALPRAZOLAM 0.25 MG PO TABS: 0.2500 mg | ORAL_TABLET | Freq: Two times a day (BID) | ORAL | Status: DC | PRN

## 2015-08-23 NOTE — Telephone Encounter (Signed)
Last filled 05/12/16--please advise 

## 2015-08-23 NOTE — Telephone Encounter (Signed)
Ok to phone in Xanax 

## 2015-08-25 NOTE — Telephone Encounter (Signed)
Rx called in to pharmacy. 

## 2015-08-26 ENCOUNTER — Telehealth: Payer: Self-pay | Admitting: Neurology

## 2015-08-26 MED ORDER — FLUCONAZOLE 150 MG PO TABS: 150.0000 mg | ORAL_TABLET | Freq: Once | ORAL | Status: DC

## 2015-08-26 NOTE — Addendum Note (Signed)
Addended by: Mancel Bale on: 08/26/2015 12:33 PM   Modules accepted: Orders

## 2015-08-26 NOTE — Telephone Encounter (Signed)
Pt wants to talk to someone about her medication. She states that is working and is having no side effects but wants to see if we need to change dosage. She missed a dosage and had a migraine please call [phone]

## 2015-08-26 NOTE — Telephone Encounter (Signed)
Patient called with 4 week update. States she is doing well on the zonisamide 100 mg capsules nightly. Having no side effects. States headaches have mostly resolved on medication. Missed one dose, and had migraine following day.  Patient prefer's communication via mychart.

## 2015-08-31 NOTE — Telephone Encounter (Signed)
Continue zonisamide at current dose.  No changes.

## 2015-09-01 NOTE — Telephone Encounter (Signed)
Mychart message sent to patient about no medication changes.

## 2015-09-02 ENCOUNTER — Ambulatory Visit (INDEPENDENT_AMBULATORY_CARE_PROVIDER_SITE_OTHER)
Admission: RE | Admit: 2015-09-02 | Discharge: 2015-09-02 | Disposition: A | Discharge status: Home / Self Care | Payer: Federal, State, Local not specified - PPO | Source: Ambulatory Visit | Attending: Internal Medicine | Admitting: Internal Medicine

## 2015-09-02 ENCOUNTER — Telehealth: Payer: Self-pay | Admitting: Internal Medicine

## 2015-09-02 ENCOUNTER — Encounter: Payer: Self-pay | Admitting: Internal Medicine

## 2015-09-02 ENCOUNTER — Encounter: Payer: Self-pay | Admitting: Neurology

## 2015-09-02 ENCOUNTER — Ambulatory Visit (INDEPENDENT_AMBULATORY_CARE_PROVIDER_SITE_OTHER): Payer: Federal, State, Local not specified - PPO | Admitting: Internal Medicine

## 2015-09-02 VITALS — BP 120/82 | HR 75 | Temp 98.4°F | Wt 261.0 lb

## 2015-09-02 DIAGNOSIS — R109 Unspecified abdominal pain: Secondary | ICD-10-CM

## 2015-09-02 DIAGNOSIS — R7989 Other specified abnormal findings of blood chemistry: Secondary | ICD-10-CM

## 2015-09-02 DIAGNOSIS — R1032 Left lower quadrant pain: Secondary | ICD-10-CM

## 2015-09-02 LAB — POC URINALSYSI DIPSTICK (AUTOMATED)
Bilirubin, UA: NEGATIVE
Glucose, UA: NEGATIVE
Ketones, UA: NEGATIVE
Leukocytes, UA: NEGATIVE
Nitrite, UA: NEGATIVE
PROTEIN UA: NEGATIVE
RBC UA: NEGATIVE
SPEC GRAV UA: 1.025
UROBILINOGEN UA: NEGATIVE
pH, UA: 7

## 2015-09-02 LAB — CBC
HCT: 37.1 % (ref 36.0–46.0)
HEMOGLOBIN: 12.5 g/dL (ref 12.0–15.0)
MCHC: 33.6 g/dL (ref 30.0–36.0)
MCV: 85.9 fl (ref 78.0–100.0)
PLATELETS: 270 10*3/uL (ref 150.0–400.0)
RBC: 4.32 Mil/uL (ref 3.87–5.11)
RDW: 13.8 % (ref 11.5–15.5)
WBC: 9.3 10*3/uL (ref 4.0–10.5)

## 2015-09-02 LAB — COMPREHENSIVE METABOLIC PANEL
ALBUMIN: 4.1 g/dL (ref 3.5–5.2)
ALK PHOS: 75 U/L (ref 39–117)
ALT: 12 U/L (ref 0–35)
AST: 14 U/L (ref 0–37)
BILIRUBIN TOTAL: 0.3 mg/dL (ref 0.2–1.2)
BUN: 13 mg/dL (ref 6–23)
CALCIUM: 8.9 mg/dL (ref 8.4–10.5)
CO2: 25 meq/L (ref 19–32)
Chloride: 106 mEq/L (ref 96–112)
Creatinine, Ser: 0.87 mg/dL (ref 0.40–1.20)
GFR: 93.8 mL/min (ref >60.00)
Glucose, Bld: 77 mg/dL (ref 70–99)
Potassium: 3.9 mEq/L (ref 3.5–5.1)
Sodium: 137 mEq/L (ref 135–145)
Total Protein: 7.2 g/dL (ref 6.0–8.3)

## 2015-09-02 LAB — HEMOGLOBIN A1C: Hgb A1c MFr Bld: 6.1 % (ref 4.6–6.5)

## 2015-09-02 LAB — TSH: TSH: 1.15 u[IU]/mL (ref 0.35–4.50)

## 2015-09-02 LAB — LIPID PANEL
CHOL/HDL RATIO: 4
CHOLESTEROL: 227 mg/dL — AB (ref 0–200)
HDL: 51.1 mg/dL (ref >39.00)
NonHDL: 175.67
TRIGLYCERIDES: 245 mg/dL — AB (ref 0.0–149.0)
VLDL: 49 mg/dL — ABNORMAL HIGH (ref 0.0–40.0)

## 2015-09-02 LAB — LDL CHOLESTEROL, DIRECT: LDL DIRECT: 147 mg/dL

## 2015-09-02 MED ORDER — HYDROCODONE-ACETAMINOPHEN 5-325 MG PO TABS: 1.0000 | ORAL_TABLET | Freq: Four times a day (QID) | ORAL | Status: DC | PRN

## 2015-09-02 MED ORDER — KETOROLAC TROMETHAMINE 30 MG/ML IJ SOLN
30.0000 mg | Freq: Once | INTRAMUSCULAR | Status: AC
  Administered 2015-09-02: 30 mg via INTRAMUSCULAR

## 2015-09-02 NOTE — Progress Notes (Signed)
Subjective:    Patient ID: Linda [name], female    DOB: 04-Apr-1976, 40 y.o.   MRN: [id number]  HPI  Pt presents to the clinic today with c/o back pain. This started 4 days ago. The pain starts in her left lower back and radiates into her left abdomen/groin area. She describes the pain as sharp and crampy. She does have associated nausea but denies vomiting and diarrhea. She has had a hysterectomy. She denies vaginal bleeding or blood in her urine. No history of kidney stones. She has taken Ibuprofen without any relief. A heating pad has helped. She denies any injury to her back that she is aware. She denies numbness and tingling down her legs.  Review of Systems      Past Medical History  Diagnosis Date  . Bleeding     POST PARTUM BLEEDING  . Migraine   . Bronchitis with flu 06/12/11  . Heart murmur     while pregnant  . Anxiety   . Asthma     Current Outpatient Prescriptions  Medication Sig Dispense Refill  . albuterol (PROVENTIL HFA;VENTOLIN HFA) 108 (90 BASE) MCG/ACT inhaler Inhale 2 puffs into the lungs every 6 (six) hours as needed for wheezing or shortness of breath. 1 Inhaler 2  . ALPRAZolam (XANAX) 0.25 MG tablet Take 1 tablet (0.25 mg total) by mouth 2 (two) times daily as needed. 20 tablet 0  . Multiple Vitamin (MULTIVITAMIN) tablet Take 1 tablet by mouth daily.    Marland Kitchen zolmitriptan (ZOMIG) 5 MG tablet Take one tablet by mouth at on set of headache can take one more tablet 2 hours later if needed 10 tablet 11  . zonisamide (ZONEGRAN) 100 MG capsule Take 1 capsule (100 mg total) by mouth at bedtime. 30 capsule 3   No current facility-administered medications for this visit.    No Known Allergies  Family History  Problem Relation Age of Onset  . Hypertension Father   . Diabetes Father   . Hypertension Mother   . Cancer Mother     uterine  . Stroke Mother   . Diabetes Maternal Aunt   . Diabetes Maternal Uncle   . Cancer Maternal Grandmother     PANCREATIC  .  Hypertension Maternal Grandmother   . Diabetes Maternal Grandmother   . Cancer Maternal Grandfather     LUNG  . Hypertension Maternal Grandfather   . Diabetes Maternal Grandfather   . Cancer Paternal Grandmother     THROAT  . Alzheimer's disease Paternal Grandmother     Social History   Social History  . Marital Status: Divorced    Spouse Name: N/A  . Number of Children: N/A  . Years of Education: N/A   Occupational History  . Not on file.   Social History Main Topics  . Smoking status: Current Every Day Smoker -- 0.00 packs/day  . Smokeless tobacco: Never Used  . Alcohol Use: 0.6 oz/week    1 Standard drinks or equivalent per week     Comment: occasional (not much)  . Drug Use: No  . Sexual Activity: Not on file   Other Topics Concern  . Not on file   Social History Narrative     Constitutional: Denies fever, malaise, fatigue, headache or abrupt weight changes.  Respiratory: Denies difficulty breathing, shortness of breath, cough or sputum production.   Cardiovascular: Denies chest pain, chest tightness, palpitations or swelling in the hands or feet.  Gastrointestinal: Pt reports abdominal pain and nausea. Denies  bloating, constipation, diarrhea or blood in the stool.  GU: Denies urgency, frequency, pain with urination, burning sensation, blood in urine, odor or discharge. Musculoskeletal: Pt reports left lower back pain. Denies difficulty with gait, muscle pain or joint pain and swelling.   Neurological: Denies dizziness, difficulty with memory, difficulty with speech or problems with balance and coordination.    No other specific complaints in a complete review of systems (except as listed in HPI above).  Objective:   Physical Exam  BP 120/82 mmHg  Pulse 75  Temp(Src) 98.4 F (36.9 C) (Oral)  Wt 261 lb (118.389 kg)  SpO2 98%  LMP 09/06/2010 Wt Readings from Last 3 Encounters:  09/02/15 261 lb (118.389 kg)  08/04/15 250 lb (113.399 kg)  07/29/15 262 lb  6.4 oz (119.024 kg)    General: Appears her stated age, obese, appears uncomfortable but in NAD. Neck:  Neck supple, trachea midline. No masses, lumps or thyromegaly present.  Cardiovascular: Normal rate and rhythm. S1,S2 noted.  No murmur, rubs or gallops noted.  Pulmonary/Chest: Normal effort and positive vesicular breath sounds. No respiratory distress. No wheezes, rales or ronchi noted.  Abdomen: Soft and tender in the LLQ. Normal bowel sounds. No distention or masses noted. + CVA tenderness noted on the left. Musculoskeletal: Normal flexion and rotation. Decreased extension. No bony tenderness noted over the spine. Strength 5/5 BLE. Neurological: Alert and oriented.    BMET    Component Value Date/Time   NA 136 03/13/2015 1353   NA 137 05/31/2013 0846   K 3.7 03/13/2015 1353   K 3.8 05/31/2013 0846   CL 104 03/13/2015 1353   CL 106 05/31/2013 0846   CO2 22 03/13/2015 1353   CO2 26 05/31/2013 0846   GLUCOSE 135* 03/13/2015 1353   GLUCOSE 93 05/31/2013 0846   BUN 12 03/13/2015 1353   BUN 10 05/31/2013 0846   CREATININE 0.89 03/13/2015 1353   CREATININE 0.76 05/31/2013 0846   CALCIUM 9.0 03/13/2015 1353   CALCIUM 8.2* 05/31/2013 0846   GFRNONAA >60 03/13/2015 1353   GFRNONAA >60 05/31/2013 0846   GFRAA >60 03/13/2015 1353   GFRAA >60 05/31/2013 0846    Lipid Panel  No results found for: CHOL, TRIG, HDL, CHOLHDL, VLDL, LDLCALC  CBC    Component Value Date/Time   WBC 8.3 03/13/2015 1353   WBC 7.3 05/31/2013 0846   RBC 4.32 03/13/2015 1353   RBC 4.46 05/31/2013 0846   HGB 12.8 03/13/2015 1353   HGB 13.3 05/31/2013 0846   HCT 38.2 03/13/2015 1353   HCT 39.2 05/31/2013 0846   PLT 266 03/13/2015 1353   PLT 219 05/31/2013 0846   MCV 88.4 03/13/2015 1353   MCV 88 05/31/2013 0846   MCH 29.6 03/13/2015 1353   MCH 29.8 05/31/2013 0846   MCHC 33.5 03/13/2015 1353   MCHC 34.0 05/31/2013 0846   RDW 13.0 03/13/2015 1353   RDW 12.8 05/31/2013 0846   LYMPHSABS 2.1  05/31/2013 0846   LYMPHSABS 2.0 04/15/2010 2050   MONOABS 0.4 05/31/2013 0846   MONOABS 0.6 04/15/2010 2050   EOSABS 0.1 05/31/2013 0846   EOSABS 0.1 04/15/2010 2050   BASOSABS 0.1 05/31/2013 0846   BASOSABS 0.0 04/15/2010 2050    Hgb A1C No results found for: HGBA1C       Assessment & Plan:   Left flank pain, left lower quadrant pain:  ? Kidney stone versus ovarian cyst Urinalysis normal Will check STAT CT scan of abdomen 30 mg Toradol IM now  eRx for Norco 5-325, limited quantity  Will check CBC, CMET, TSH, Lipid and A1C- she is having her annual exam next week Return precautions given, will follow up after CT scan

## 2015-09-02 NOTE — Patient Instructions (Signed)
Kidney Stones °Kidney stones (urolithiasis) are deposits that form inside your kidneys. The intense pain is caused by the stone moving through the urinary tract. When the stone moves, the ureter goes into spasm around the stone. The stone is usually passed in the urine.  °CAUSES  °· A disorder that makes certain neck glands produce too much parathyroid hormone (primary hyperparathyroidism). °· A buildup of uric acid crystals, similar to gout in your joints. °· Narrowing (stricture) of the ureter. °· A kidney obstruction present at birth (congenital obstruction). °· Previous surgery on the kidney or ureters. °· Numerous kidney infections. °SYMPTOMS  °· Feeling sick to your stomach (nauseous). °· Throwing up (vomiting). °· Blood in the urine (hematuria). °· Pain that usually spreads (radiates) to the groin. °· Frequency or urgency of urination. °DIAGNOSIS  °· Taking a history and physical exam. °· Blood or urine tests. °· CT scan. °· Occasionally, an examination of the inside of the urinary bladder (cystoscopy) is performed. °TREATMENT  °· Observation. °· Increasing your fluid intake. °· Extracorporeal shock wave lithotripsy--This is a noninvasive procedure that uses shock waves to break up kidney stones. °· Surgery may be needed if you have severe pain or persistent obstruction. There are various surgical procedures. Most of the procedures are performed with the use of small instruments. Only small incisions are needed to accommodate these instruments, so recovery time is minimized. °The size, location, and chemical composition are all important variables that will determine the proper choice of action for you. Talk to your health care provider to better understand your situation so that you will minimize the risk of injury to yourself and your kidney.  °HOME CARE INSTRUCTIONS  °· Drink enough water and fluids to keep your urine clear or pale yellow. This will help you to pass the stone or stone fragments. °· Strain  all urine through the provided strainer. Keep all particulate matter and stones for your health care provider to see. The stone causing the pain may be as small as a grain of salt. It is very important to use the strainer each and every time you pass your urine. The collection of your stone will allow your health care provider to analyze it and verify that a stone has actually passed. The stone analysis will often identify what you can do to reduce the incidence of recurrences. °· Only take over-the-counter or prescription medicines for pain, discomfort, or fever as directed by your health care provider. °· Keep all follow-up visits as told by your health care provider. This is important. °· Get follow-up X-rays if required. The absence of pain does not always mean that the stone has passed. It may have only stopped moving. If the urine remains completely obstructed, it can cause loss of kidney function or even complete destruction of the kidney. It is your responsibility to make sure X-rays and follow-ups are completed. Ultrasounds of the kidney can show blockages and the status of the kidney. Ultrasounds are not associated with any radiation and can be performed easily in a matter of minutes. °· Make changes to your daily diet as told by your health care provider. You may be told to: °¨ Limit the amount of salt that you eat. °¨ Eat 5 or more servings of fruits and vegetables each day. °¨ Limit the amount of meat, poultry, fish, and eggs that you eat. °· Collect a 24-hour urine sample as told by your health care provider. You may need to collect another urine sample every 6-12   months. °SEEK MEDICAL CARE IF: °· You experience pain that is progressive and unresponsive to any pain medicine you have been prescribed. °SEEK IMMEDIATE MEDICAL CARE IF:  °· Pain cannot be controlled with the prescribed medicine. °· You have a fever or shaking chills. °· The severity or intensity of pain increases over 18 hours and is not  relieved by pain medicine. °· You develop a new onset of abdominal pain. °· You feel faint or pass out. °· You are unable to urinate. °  °This information is not intended to replace advice given to you by your health care provider. Make sure you discuss any questions you have with your health care provider. °  °Document Released: 06/28/2005 Document Revised: 03/19/2015 Document Reviewed: 11/29/2012 °Elsevier Interactive Patient Education ©2016 [employer] ° °

## 2015-09-02 NOTE — Addendum Note (Signed)
Addended by: Lurlean Nanny on: 09/02/2015 02:24 PM   Modules accepted: Orders

## 2015-09-02 NOTE — Telephone Encounter (Signed)
Refer to result note 

## 2015-09-02 NOTE — Progress Notes (Signed)
Pre visit review using our clinic review tool, if applicable. No additional management support is needed unless otherwise documented below in the visit note. 

## 2015-09-02 NOTE — Telephone Encounter (Signed)
Pt would like return call regarding ct scan, has question about results  cb number is [phone] Thanks

## 2015-09-03 ENCOUNTER — Other Ambulatory Visit: Payer: Federal, State, Local not specified - PPO

## 2015-09-03 NOTE — Telephone Encounter (Addendum)
Patient has been having abdominal pain, very severe, since Friday. Seen by PCP who d/x with Kidney stone and set up a CT. CT came back clear. Now PCP thinks it may be a side effect from [facility], as it is the only new medication that pt has started in recently. However, pt has been on medication for ~30 days before pain started. Patient and PCP would like your opinion on whether or not you feel abdominal pain could be side effect from [facility]. Please advise.

## 2015-09-03 NOTE — Telephone Encounter (Signed)
Pt spoke to Dr Tomi Likens due to her back pain and etc---could be side effects of the Zonegran---advised per Dr Tomi Likens to stop Zonegran to see if pain will go away for now--email correspondence is in chart--pt just wanted to let you know

## 2015-09-03 NOTE — Telephone Encounter (Signed)
Correction. Pt concerned about side effect from [facility] not Zomig!

## 2015-09-05 ENCOUNTER — Ambulatory Visit (INDEPENDENT_AMBULATORY_CARE_PROVIDER_SITE_OTHER): Payer: Federal, State, Local not specified - PPO | Admitting: Family Medicine

## 2015-09-05 VITALS — BP 108/82 | HR 98 | Temp 98.5°F | Resp 18 | Ht 68.5 in | Wt 256.0 lb

## 2015-09-05 DIAGNOSIS — M5432 Sciatica, left side: Secondary | ICD-10-CM | POA: Diagnosis not present

## 2015-09-05 MED ORDER — METHYLPREDNISOLONE 4 MG PO TBPK
ORAL_TABLET | ORAL | Status: DC
Start: 2015-09-05 — End: 2015-09-10

## 2015-09-05 MED ORDER — CYCLOBENZAPRINE HCL 5 MG PO TABS: 5.0000 mg | ORAL_TABLET | Freq: Three times a day (TID) | ORAL | Status: DC | PRN

## 2015-09-05 NOTE — Patient Instructions (Addendum)
You have an appointment for MRI tomorrow 09/06/15 at [facility] [address] [place] at 445pm  Sciatica Sciatica is pain, weakness, numbness, or tingling along the path of the sciatic nerve. The nerve starts in the lower back and runs down the back of each leg. The nerve controls the muscles in the lower leg and in the back of the knee, while also providing sensation to the back of the thigh, lower leg, and the sole of your foot. Sciatica is a symptom of another medical condition. For instance, nerve damage or certain conditions, such as a herniated disk or bone spur on the spine, pinch or put pressure on the sciatic nerve. This causes the pain, weakness, or other sensations normally associated with sciatica. Generally, sciatica only affects one side of the body. CAUSES   Herniated or slipped disc.  Degenerative disk disease.  A pain disorder involving the narrow muscle in the buttocks (piriformis syndrome).  Pelvic injury or fracture.  Pregnancy.  Tumor (rare). SYMPTOMS  Symptoms can vary from mild to very severe. The symptoms usually travel from the low back to the buttocks and down the back of the leg. Symptoms can include:  Mild tingling or dull aches in the lower back, leg, or hip.  Numbness in the back of the calf or sole of the foot.  Burning sensations in the lower back, leg, or hip.  Sharp pains in the lower back, leg, or hip.  Leg weakness.  Severe back pain inhibiting movement. These symptoms may get worse with coughing, sneezing, laughing, or prolonged sitting or standing. Also, being overweight may worsen symptoms. DIAGNOSIS  Your caregiver will perform a physical exam to look for common symptoms of sciatica. He or she may ask you to do certain movements or activities that would trigger sciatic nerve pain. Other tests may be performed to find the cause of the sciatica. These may include:  Blood tests.  X-rays.  Imaging tests, such as an MRI  or CT scan. TREATMENT  Treatment is directed at the cause of the sciatic pain. Sometimes, treatment is not necessary and the pain and discomfort goes away on its own. If treatment is needed, your caregiver may suggest:  Over-the-counter medicines to relieve pain.  Prescription medicines, such as anti-inflammatory medicine, muscle relaxants, or narcotics.  Applying heat or ice to the painful area.  Steroid injections to lessen pain, irritation, and inflammation around the nerve.  Reducing activity during periods of pain.  Exercising and stretching to strengthen your abdomen and improve flexibility of your spine. Your caregiver may suggest losing weight if the extra weight makes the back pain worse.  Physical therapy.  Surgery to eliminate what is pressing or pinching the nerve, such as a bone spur or part of a herniated disk. HOME CARE INSTRUCTIONS   Only take over-the-counter or prescription medicines for pain or discomfort as directed by your caregiver.  Apply ice to the affected area for 20 minutes, 3-4 times a day for the first 48-72 hours. Then try heat in the same way.  Exercise, stretch, or perform your usual activities if these do not aggravate your pain.  Attend physical therapy sessions as directed by your caregiver.  Keep all follow-up appointments as directed by your caregiver.  Do not wear high heels or shoes that do not provide proper support.  Check your mattress to see if it is too soft. A firm mattress may lessen your pain and discomfort. SEEK IMMEDIATE MEDICAL CARE IF:  You lose control of your bowel or bladder (incontinence).  You have increasing weakness in the lower back, pelvis, buttocks, or legs.  You have redness or swelling of your back.  You have a burning sensation when you urinate.  You have pain that gets worse when you lie down or awakens you at night.  Your pain is worse than you have experienced in the past.  Your pain is lasting longer  than 4 weeks.  You are suddenly losing weight without reason. MAKE SURE YOU:  Understand these instructions.  Will watch your condition.  Will get help right away if you are not doing well or get worse.   This information is not intended to replace advice given to you by your health care provider. Make sure you discuss any questions you have with your health care provider.   Document Released: 06/22/2001 Document Revised: 03/19/2015 Document Reviewed: 11/07/2011 Elsevier Interactive Patient Education [employer].

## 2015-09-05 NOTE — Progress Notes (Signed)
@[email or web address] @  By signing my name below, I, Raven Small, attest that this documentation has been prepared under the direction and in the presence of Robyn Haber, MD.  Electronically Signed: Thea Alken, ED Scribe. 09/05/2015. 9:21 AM.  Patient ID: Linda [name] MRN: [id number], DOB: 17-Oct-1975, 40 y.o. Date of Encounter: 09/05/2015, 9:21 AM  Primary Physician: Webb Silversmith, NP  Chief Complaint:  Chief Complaint  Patient presents with  . Back Pain    x 1 week, radiates down legs  . Abdominal Pain     HPI: 40 y.o. year old female with history below presents with intermittent left lower back pain, abdominal pain and leg pain. Pt states symptoms started with left low back pain 7-8 days ago. She reports 1-2 days later she developed abdominal pain. She was seen by PCP 3 days ago and was suspect to have poss kidney stone vs ovarian cyst vs intolerance to seizure medication. She received injection a toradol without relief to pain and was prescribed hydrocodone which has not helped either. She finds that a heating pad works best for pain. She reports worsening symptoms of pain radiating down left leg that began yesterday. She reports pain with standing, walking, climbing steps and straightening left legs. She also has pain resting and laying flat but not a bad. She also mentions that she does not have pain in more than 1 area at a time. Pt denies fever, constipation and rash.    Past Medical History  Diagnosis Date  . Bleeding     POST PARTUM BLEEDING  . Migraine   . Bronchitis with flu 06/12/11  . Heart murmur     while pregnant  . Anxiety   . Asthma      Home Meds: Prior to Admission medications   Medication Sig Start Date End Date Taking? Authorizing Provider  ALPRAZolam (XANAX) 0.25 MG tablet Take 1 tablet (0.25 mg total) by mouth 2 (two) times daily as needed. 08/23/15  Yes Jearld Fenton, NP  Multiple Vitamin (MULTIVITAMIN) tablet Take 1 tablet by mouth daily.   Yes Historical  Provider, MD  zolmitriptan (ZOMIG) 5 MG tablet Take one tablet by mouth at on set of headache can take one more tablet 2 hours later if needed 08/04/15  Yes Adam Telford Nab, DO  albuterol (PROVENTIL HFA;VENTOLIN HFA) 108 (90 BASE) MCG/ACT inhaler Inhale 2 puffs into the lungs every 6 (six) hours as needed for wheezing or shortness of breath. Patient not taking: Reported on 09/05/2015 08/22/14   Jinny Sanders, MD  HYDROcodone-acetaminophen (NORCO/VICODIN) 5-325 MG tablet Take 1 tablet by mouth every 6 (six) hours as needed for moderate pain. Patient not taking: Reported on 09/05/2015 09/02/15   Jearld Fenton, NP  zonisamide (ZONEGRAN) 100 MG capsule Take 1 capsule (100 mg total) by mouth at bedtime. Patient not taking: Reported on 09/05/2015 08/04/15   Pieter Partridge, DO    Allergies: No Known Allergies  Social History   Social History  . Marital Status: Divorced    Spouse Name: N/A  . Number of Children: N/A  . Years of Education: N/A   Occupational History  . Not on file.   Social History Main Topics  . Smoking status: Never Smoker   . Smokeless tobacco: Never Used  . Alcohol Use: 0.6 oz/week    1 Standard drinks or equivalent per week     Comment: occasional (not much)  . Drug Use: No  . Sexual Activity: Not on file   Other  Topics Concern  . Not on file   Social History Narrative     Review of Systems: Constitutional: negative for chills, fever, night sweats, weight changes, or fatigue  HEENT: negative for vision changes, hearing loss, congestion, rhinorrhea, ST, epistaxis, or sinus pressure Cardiovascular: negative for chest pain or palpitations Respiratory: negative for hemoptysis, wheezing, shortness of breath, or cough Abdominal: negative for abdominal pain, nausea, vomiting, diarrhea, or constipation Dermatological: negative for rash Neurologic: negative for headache, dizziness, or syncope All other systems reviewed and are otherwise negative with the exception to those  above and in the HPI.   Physical Exam: Blood pressure 108/82, pulse 98, temperature 98.5 F (36.9 C), resp. rate 18, height 5' 8.5" (1.74 m), weight 256 lb (116.121 kg), last menstrual period 09/06/2010, SpO2 98 %., Body mass index is 38 kg/(m^2). General: Well developed, well nourished, in no acute distress. Head: Normocephalic, atraumatic, eyes without discharge, sclera non-icteric, nares are without discharge. Bilateral auditory canals clear, TM's are without perforation, pearly grey and translucent with reflective cone of light bilaterally. Oral cavity moist, posterior pharynx without exudate, erythema, peritonsillar abscess, or post nasal drip.  Neck: Supple. No thyromegaly. Full ROM. No lymphadenopathy. Lungs: Clear bilaterally to auscultation without wheezes, rales, or rhonchi. Breathing is unlabored. Heart: RRR with S1 S2. No murmurs, rubs, or gallops appreciated. Abdomen: Soft, non-tender, non-distended with normoactive bowel sounds. No hepatomegaly. No rebound/guarding. No obvious abdominal masses. Msk:  Strength and tone normal for age. Tender left quadriceps and left flank. Positive straight leg raise but reflexes are preserved.  Extremities/Skin: Warm and dry. No clubbing or cyanosis. No edema. No rashes or suspicious lesions. Neuro: Alert and oriented X 3. Moves all extremities spontaneously. Gait is normal. CNII-XII grossly in tact. Psych:  Responds to questions appropriately with a normal affect.   ASSESSMENT AND PLAN:  40 y.o. year old female with  This chart was scribed in my presence and reviewed by me personally.    ICD-9-CM ICD-10-CM   1. Sciatica of left side 724.3 M54.32 MR Lumbar Spine Wo Contrast     methylPREDNISolone (MEDROL DOSEPAK) 4 MG TBPK tablet     cyclobenzaprine (FLEXERIL) 5 MG tablet     Signed, Robyn Haber, MD 09/05/2015 9:21 AM

## 2015-09-06 ENCOUNTER — Ambulatory Visit (HOSPITAL_BASED_OUTPATIENT_CLINIC_OR_DEPARTMENT_OTHER)
Admission: RE | Admit: 2015-09-06 | Discharge: 2015-09-06 | Disposition: A | Discharge status: Home / Self Care | Payer: Federal, State, Local not specified - PPO | Source: Ambulatory Visit | Attending: Family Medicine | Admitting: Family Medicine

## 2015-09-06 DIAGNOSIS — M5432 Sciatica, left side: Secondary | ICD-10-CM | POA: Diagnosis present

## 2015-09-06 DIAGNOSIS — M4696 Unspecified inflammatory spondylopathy, lumbar region: Secondary | ICD-10-CM | POA: Insufficient documentation

## 2015-09-08 ENCOUNTER — Encounter: Payer: Self-pay | Admitting: Internal Medicine

## 2015-09-08 ENCOUNTER — Ambulatory Visit (INDEPENDENT_AMBULATORY_CARE_PROVIDER_SITE_OTHER): Payer: Federal, State, Local not specified - PPO | Admitting: Internal Medicine

## 2015-09-08 VITALS — BP 124/84 | HR 76 | Temp 98.5°F | Ht 67.5 in | Wt 257.0 lb

## 2015-09-08 DIAGNOSIS — Z23 Encounter for immunization: Secondary | ICD-10-CM | POA: Diagnosis not present

## 2015-09-08 DIAGNOSIS — M5442 Lumbago with sciatica, left side: Secondary | ICD-10-CM | POA: Diagnosis not present

## 2015-09-08 DIAGNOSIS — Z0000 Encounter for general adult medical examination without abnormal findings: Secondary | ICD-10-CM | POA: Diagnosis not present

## 2015-09-08 DIAGNOSIS — K219 Gastro-esophageal reflux disease without esophagitis: Secondary | ICD-10-CM | POA: Diagnosis not present

## 2015-09-08 MED ORDER — ALBUTEROL SULFATE HFA 108 (90 BASE) MCG/ACT IN AERS: 2.0000 | INHALATION_SPRAY | Freq: Four times a day (QID) | RESPIRATORY_TRACT | Status: DC | PRN

## 2015-09-08 MED ORDER — PANTOPRAZOLE SODIUM 40 MG PO TBEC: 40.0000 mg | DELAYED_RELEASE_TABLET | Freq: Every day | ORAL | Status: DC

## 2015-09-08 NOTE — Progress Notes (Signed)
Subjective:    Patient ID: Linda [name], female    DOB: August 31, 1975, 40 y.o.   MRN: [id number]  HPI  Pt presents to the clinic today for her annual exam. She is also here for follow up of left lower back pain, see separate note.  Flu: 07/2015 Tetanus: > 10 years ago Pap Smear: 2014, total hysterectomy Dentist: 05/2015  Diet: She does eat meat. She eats fruits and veggies daily. She does consume some fried foods. She drinks mostly sweet tea. Exercise: None.  Review of Systems      Past Medical History  Diagnosis Date  . Bleeding     POST PARTUM BLEEDING  . Migraine   . Bronchitis with flu 06/12/11  . Heart murmur     while pregnant  . Anxiety   . Asthma     Current Outpatient Prescriptions  Medication Sig Dispense Refill  . albuterol (PROVENTIL HFA;VENTOLIN HFA) 108 (90 BASE) MCG/ACT inhaler Inhale 2 puffs into the lungs every 6 (six) hours as needed for wheezing or shortness of breath. (Patient not taking: Reported on 09/05/2015) 1 Inhaler 2  . ALPRAZolam (XANAX) 0.25 MG tablet Take 1 tablet (0.25 mg total) by mouth 2 (two) times daily as needed. 20 tablet 0  . cyclobenzaprine (FLEXERIL) 5 MG tablet Take 1 tablet (5 mg total) by mouth 3 (three) times daily as needed for muscle spasms. 30 tablet 0  . HYDROcodone-acetaminophen (NORCO/VICODIN) 5-325 MG tablet Take 1 tablet by mouth every 6 (six) hours as needed for moderate pain. (Patient not taking: Reported on 09/05/2015) 20 tablet 0  . methylPREDNISolone (MEDROL DOSEPAK) 4 MG TBPK tablet As directe 21 tablet 0  . Multiple Vitamin (MULTIVITAMIN) tablet Take 1 tablet by mouth daily.    Marland Kitchen zolmitriptan (ZOMIG) 5 MG tablet Take one tablet by mouth at on set of headache can take one more tablet 2 hours later if needed 10 tablet 11  . zonisamide (ZONEGRAN) 100 MG capsule Take 1 capsule (100 mg total) by mouth at bedtime. (Patient not taking: Reported on 09/05/2015) 30 capsule 3   No current facility-administered medications for  this visit.    No Known Allergies  Family History  Problem Relation Age of Onset  . Hypertension Father   . Diabetes Father   . Hypertension Mother   . Cancer Mother     uterine  . Stroke Mother   . Diabetes Maternal Aunt   . Diabetes Maternal Uncle   . Cancer Maternal Grandmother     PANCREATIC  . Hypertension Maternal Grandmother   . Diabetes Maternal Grandmother   . Cancer Maternal Grandfather     LUNG  . Hypertension Maternal Grandfather   . Diabetes Maternal Grandfather   . Cancer Paternal Grandmother     THROAT  . Alzheimer's disease Paternal Grandmother     Social History   Social History  . Marital Status: Divorced    Spouse Name: N/A  . Number of Children: N/A  . Years of Education: N/A   Occupational History  . Not on file.   Social History Main Topics  . Smoking status: Never Smoker   . Smokeless tobacco: Never Used  . Alcohol Use: 0.6 oz/week    1 Standard drinks or equivalent per week     Comment: occasional (not much)  . Drug Use: No  . Sexual Activity: Not on file   Other Topics Concern  . Not on file   Social History Narrative     Constitutional:  Denies fever, malaise, fatigue, headache or abrupt weight changes.  HEENT: Denies eye pain, eye redness, ear pain, ringing in the ears, wax buildup, runny nose, nasal congestion, bloody nose, or sore throat. Respiratory: Denies difficulty breathing, shortness of breath, cough or sputum production.   Cardiovascular: Denies chest pain, chest tightness, palpitations or swelling in the hands or feet.  Gastrointestinal: Pt reports reflux. Denies abdominal pain, bloating, constipation, diarrhea or blood in the stool.  GU: Denies urgency, frequency, pain with urination, burning sensation, blood in urine, odor or discharge. Musculoskeletal: Pt reports back pain. Denies decrease in range of motion, difficulty with gait, muscle pain or joint swelling.  Skin: Denies redness, rashes, lesions or ulcercations.    Neurological: Denies dizziness, difficulty with memory, difficulty with speech or problems with balance and coordination.  Psych: Pt has history of anxiety. Denies depression, SI/HI.  No other specific complaints in a complete review of systems (except as listed in HPI above).  Objective:   Physical Exam   BP 124/84 mmHg  Pulse 76  Temp(Src) 98.5 F (36.9 C) (Oral)  Ht 5' 7.5" (1.715 m)  Wt 257 lb (116.574 kg)  BMI 39.63 kg/m2  SpO2 98%  LMP 09/06/2010 Wt Readings from Last 3 Encounters:  09/08/15 257 lb (116.574 kg)  09/05/15 256 lb (116.121 kg)  09/02/15 261 lb (118.389 kg)    General: Appears her stated age, obese in NAD. Skin: Warm, dry and intact. Areas of hypopigmentation noted on neck. No lesions or ulcerations noted. HEENT: Head: normal shape and size; Eyes: sclera white, no icterus, conjunctiva pink, PERRLA and EOMs intact; Ears: Tm's gray and intact, normal light reflex; Throat/Mouth: Teeth present, mucosa pink and moist, no exudate, lesions or ulcerations noted.  Neck:  Neck supple, trachea midline. No masses, lumps or thyromegaly present.  Cardiovascular: Normal rate and rhythm. S1,S2 noted.  No murmur, rubs or gallops noted. No JVD or BLE edema.  Pulmonary/Chest: Normal effort and positive vesicular breath sounds. No respiratory distress. No wheezes, rales or ronchi noted.  Abdomen: Soft and nontender. Normal bowel sounds. No distention or masses noted. Liver, spleen and kidneys non palpable. Musculoskeletal: Strength 5/5 BUE/BLE. No signs of joint swelling. No difficulty with gait.  Neurological: Alert and oriented. Cranial nerves II-XII grossly intact. Coordination normal.  Psychiatric: Mood and affect normal. Behavior is normal. Judgment and thought content normal.    BMET    Component Value Date/Time   NA 137 09/02/2015 1107   NA 137 05/31/2013 0846   K 3.9 09/02/2015 1107   K 3.8 05/31/2013 0846   CL 106 09/02/2015 1107   CL 106 05/31/2013 0846   CO2  25 09/02/2015 1107   CO2 26 05/31/2013 0846   GLUCOSE 77 09/02/2015 1107   GLUCOSE 93 05/31/2013 0846   BUN 13 09/02/2015 1107   BUN 10 05/31/2013 0846   CREATININE 0.87 09/02/2015 1107   CREATININE 0.76 05/31/2013 0846   CALCIUM 8.9 09/02/2015 1107   CALCIUM 8.2* 05/31/2013 0846   GFRNONAA >60 03/13/2015 1353   GFRNONAA >60 05/31/2013 0846   GFRAA >60 03/13/2015 1353   GFRAA >60 05/31/2013 0846    Lipid Panel     Component Value Date/Time   CHOL 227* 09/02/2015 1107   TRIG 245.0* 09/02/2015 1107   HDL 51.10 09/02/2015 1107   CHOLHDL 4 09/02/2015 1107   VLDL 49.0* 09/02/2015 1107    CBC    Component Value Date/Time   WBC 9.3 09/02/2015 1107   WBC 7.3 05/31/2013 0846  RBC 4.32 09/02/2015 1107   RBC 4.46 05/31/2013 0846   HGB 12.5 09/02/2015 1107   HGB 13.3 05/31/2013 0846   HCT 37.1 09/02/2015 1107   HCT 39.2 05/31/2013 0846   PLT 270.0 09/02/2015 1107   PLT 219 05/31/2013 0846   MCV 85.9 09/02/2015 1107   MCV 88 05/31/2013 0846   MCH 29.6 03/13/2015 1353   MCH 29.8 05/31/2013 0846   MCHC 33.6 09/02/2015 1107   MCHC 34.0 05/31/2013 0846   RDW 13.8 09/02/2015 1107   RDW 12.8 05/31/2013 0846   LYMPHSABS 2.1 05/31/2013 0846   LYMPHSABS 2.0 04/15/2010 2050   MONOABS 0.4 05/31/2013 0846   MONOABS 0.6 04/15/2010 2050   EOSABS 0.1 05/31/2013 0846   EOSABS 0.1 04/15/2010 2050   BASOSABS 0.1 05/31/2013 0846   BASOSABS 0.0 04/15/2010 2050    Hgb A1C Lab Results  Component Value Date   HGBA1C 6.1 09/02/2015        Assessment & Plan:   Preventative Health Maintenance:  Flu shot UTD Tdap today Encouraged her to consume a healthy diet and start an exercise regimen Pelvic exam due 2019 Encouraged her to see a dentist at least annually CBC, CMET, TSH, Lipid and A1C reviewed  RTC in 1 year, sooner if needed.  HPI:  Pt presents to the clinic today to follow up UC visit. She was seen in the office 2/21, with c/o left flank pain that radiated into the left  pelvis. CT abdomen was negative for kidney stone or ovarian cyst. She went to UC 3 days later, diagnosed with left side sciatica. She was treated with Prednisone and Flexeril. MRI of the lumbar spine showed:  IMPRESSION: Minor lumbar disc pathology as described. Slight annular bulging at L4-5 in the foramen and beyond on the LEFT could irritate the LEFT L4 nerve root. Mild lower lumbar facet arthropathy without compressive lesion.  She would like to discuss her treatment options.  She also reports worsening reflux. This is occuring nightly. She is trying to avoid her triggers but she reports it is happening anyway. She is not getting any relief from OTC. She was on Protonix in the past and would like to restart it.  Review of Systems:   Past Medical History  Diagnosis Date  . Bleeding     POST PARTUM BLEEDING  . Migraine   . Bronchitis with flu 06/12/11  . Heart murmur     while pregnant  . Anxiety   . Asthma     Current Outpatient Prescriptions  Medication Sig Dispense Refill  . albuterol (PROVENTIL HFA;VENTOLIN HFA) 108 (90 Base) MCG/ACT inhaler Inhale 2 puffs into the lungs every 6 (six) hours as needed for wheezing or shortness of breath. 1 Inhaler 2  . ALPRAZolam (XANAX) 0.25 MG tablet Take 1 tablet (0.25 mg total) by mouth 2 (two) times daily as needed. 20 tablet 0  . cyclobenzaprine (FLEXERIL) 5 MG tablet Take 1 tablet (5 mg total) by mouth 3 (three) times daily as needed for muscle spasms. 30 tablet 0  . methylPREDNISolone (MEDROL DOSEPAK) 4 MG TBPK tablet As directe 21 tablet 0  . Multiple Vitamin (MULTIVITAMIN) tablet Take 1 tablet by mouth daily.    Marland Kitchen zolmitriptan (ZOMIG) 5 MG tablet Take one tablet by mouth at on set of headache can take one more tablet 2 hours later if needed 10 tablet 11  . zonisamide (ZONEGRAN) 100 MG capsule Take 1 capsule (100 mg total) by mouth at bedtime. 30 capsule 3  .  HYDROcodone-acetaminophen (NORCO/VICODIN) 5-325 MG tablet Take 1 tablet by  mouth every 6 (six) hours as needed for moderate pain. (Patient not taking: Reported on 09/08/2015) 20 tablet 0  . pantoprazole (PROTONIX) 40 MG tablet Take 1 tablet (40 mg total) by mouth daily. 30 tablet 3   No current facility-administered medications for this visit.    No Known Allergies  Family History  Problem Relation Age of Onset  . Hypertension Father   . Diabetes Father   . Hypertension Mother   . Cancer Mother     uterine  . Stroke Mother   . Diabetes Maternal Aunt   . Diabetes Maternal Uncle   . Cancer Maternal Grandmother     PANCREATIC  . Hypertension Maternal Grandmother   . Diabetes Maternal Grandmother   . Cancer Maternal Grandfather     LUNG  . Hypertension Maternal Grandfather   . Diabetes Maternal Grandfather   . Cancer Paternal Grandmother     THROAT  . Alzheimer's disease Paternal Grandmother     Social History   Social History  . Marital Status: Divorced    Spouse Name: N/A  . Number of Children: N/A  . Years of Education: N/A   Occupational History  . Not on file.   Social History Main Topics  . Smoking status: Never Smoker   . Smokeless tobacco: Never Used  . Alcohol Use: 0.6 oz/week    1 Standard drinks or equivalent per week     Comment: occasional (not much)  . Drug Use: No  . Sexual Activity: Not on file   Other Topics Concern  . Not on file   Social History Narrative    Constitutional: Denies fever, malaise, fatigue, headache or abrupt weight changes.  Respiratory: Denies difficulty breathing, shortness of breath, cough or sputum production.   Cardiovascular: Denies chest pain, chest tightness, palpitations or swelling in the hands or feet.  Gastrointestinal: Pt reports reflux. Denies abdominal pain, bloating, constipation, diarrhea or blood in the stool.  GU: Denies urgency, frequency, pain with urination, burning sensation, blood in urine, odor or discharge. Musculoskeletal: Pt reports back pain. Denies decrease in range of  motion, difficulty with gait, muscle pain or joint swelling.     No other specific complaints in a complete review of systems (except as listed in HPI above).  Objective:  BP 124/84 mmHg  Pulse 76  Temp(Src) 98.5 F (36.9 C) (Oral)  Ht 5' 7.5" (1.715 m)  Wt 257 lb (116.574 kg)  BMI 39.63 kg/m2  SpO2 98%  LMP 09/06/2010 Wt Readings from Last 3 Encounters:  09/08/15 257 lb (116.574 kg)  09/05/15 256 lb (116.121 kg)  09/02/15 261 lb (118.389 kg)    General: Appears her stated age, obese in NAD. Cardiovascular: Normal rate and rhythm. S1,S2 noted.  No murmur, rubs or gallops noted.  Pulmonary/Chest: Normal effort and positive vesicular breath sounds. No respiratory distress. No wheezes, rales or ronchi noted.  Abdomen: Soft and nontender. Normal bowel sounds. No distention or masses noted. Liver, spleen and kidneys non palpable. Musculoskeletal: Strength BUE/BLE. No signs of joint swelling. No difficulty with gait.  Neurological: Alert and oriented. Sensation intact to BLE.   BMET    Component Value Date/Time   NA 137 09/02/2015 1107   NA 137 05/31/2013 0846   K 3.9 09/02/2015 1107   K 3.8 05/31/2013 0846   CL 106 09/02/2015 1107   CL 106 05/31/2013 0846   CO2 25 09/02/2015 1107   CO2 26 05/31/2013 0846  GLUCOSE 77 09/02/2015 1107   GLUCOSE 93 05/31/2013 0846   BUN 13 09/02/2015 1107   BUN 10 05/31/2013 0846   CREATININE 0.87 09/02/2015 1107   CREATININE 0.76 05/31/2013 0846   CALCIUM 8.9 09/02/2015 1107   CALCIUM 8.2* 05/31/2013 0846   GFRNONAA >60 03/13/2015 1353   GFRNONAA >60 05/31/2013 0846   GFRAA >60 03/13/2015 1353   GFRAA >60 05/31/2013 0846    Lipid Panel     Component Value Date/Time   CHOL 227* 09/02/2015 1107   TRIG 245.0* 09/02/2015 1107   HDL 51.10 09/02/2015 1107   CHOLHDL 4 09/02/2015 1107   VLDL 49.0* 09/02/2015 1107    CBC    Component Value Date/Time   WBC 9.3 09/02/2015 1107   WBC 7.3 05/31/2013 0846   RBC 4.32 09/02/2015 1107     RBC 4.46 05/31/2013 0846   HGB 12.5 09/02/2015 1107   HGB 13.3 05/31/2013 0846   HCT 37.1 09/02/2015 1107   HCT 39.2 05/31/2013 0846   PLT 270.0 09/02/2015 1107   PLT 219 05/31/2013 0846   MCV 85.9 09/02/2015 1107   MCV 88 05/31/2013 0846   MCH 29.6 03/13/2015 1353   MCH 29.8 05/31/2013 0846   MCHC 33.6 09/02/2015 1107   MCHC 34.0 05/31/2013 0846   RDW 13.8 09/02/2015 1107   RDW 12.8 05/31/2013 0846   LYMPHSABS 2.1 05/31/2013 0846   LYMPHSABS 2.0 04/15/2010 2050   MONOABS 0.4 05/31/2013 0846   MONOABS 0.6 04/15/2010 2050   EOSABS 0.1 05/31/2013 0846   EOSABS 0.1 04/15/2010 2050   BASOSABS 0.1 05/31/2013 0846   BASOSABS 0.0 04/15/2010 2050    Hgb A1C Lab Results  Component Value Date   HGBA1C 6.1 09/02/2015    Assessment and Plan:  Low back pain with left side sciatica:  UC and MRI reviewed She is feeling better on the Prednisone She refuses physical therapy She declines referral to neurosurgery for further evaluation/injection Use Aleve after you finish Prednisone  GERD:  eRx for Protonix 40 mg daily Avoid triggers Encourage weight loss  RTC in 1 year, sooner if needed

## 2015-09-08 NOTE — Patient Instructions (Signed)
Health Maintenance, Female Adopting a healthy lifestyle and getting preventive care can go a long way to promote health and wellness. Talk with your health care provider about what schedule of regular examinations is right for you. This is a good chance for you to check in with your provider about disease prevention and staying healthy. In between checkups, there are plenty of things you can do on your own. Experts have done a lot of research about which lifestyle changes and preventive measures are most likely to keep you healthy. Ask your health care provider for more information. WEIGHT AND DIET  Eat a healthy diet  Be sure to include plenty of vegetables, fruits, low-fat dairy products, and lean protein.  Do not eat a lot of foods high in solid fats, added sugars, or salt.  Get regular exercise. This is one of the most important things you can do for your health.  Most adults should exercise for at least 150 minutes each week. The exercise should increase your heart rate and make you sweat (moderate-intensity exercise).  Most adults should also do strengthening exercises at least twice a week. This is in addition to the moderate-intensity exercise.  Maintain a healthy weight  Body mass index (BMI) is a measurement that can be used to identify possible weight problems. It estimates body fat based on height and weight. Your health care provider can help determine your BMI and help you achieve or maintain a healthy weight.  For females 20 years of age and older:   A BMI below 18.5 is considered underweight.  A BMI of 18.5 to 24.9 is normal.  A BMI of 25 to 29.9 is considered overweight.  A BMI of 30 and above is considered obese.  Watch levels of cholesterol and blood lipids  You should start having your blood tested for lipids and cholesterol at 38 years of age, then have this test every 5 years.  You may need to have your cholesterol levels checked more often if:  Your lipid  or cholesterol levels are high.  You are older than 38 years of age.  You are at high risk for heart disease.  CANCER SCREENING   Lung Cancer  Lung cancer screening is recommended for adults 55-80 years old who are at high risk for lung cancer because of a history of smoking.  A yearly low-dose CT scan of the lungs is recommended for people who:  Currently smoke.  Have quit within the past 15 years.  Have at least a 30-pack-year history of smoking. A pack year is smoking an average of one pack of cigarettes a day for 1 year.  Yearly screening should continue until it has been 15 years since you quit.  Yearly screening should stop if you develop a health problem that would prevent you from having lung cancer treatment.  Breast Cancer  Practice breast self-awareness. This means understanding how your breasts normally appear and feel.  It also means doing regular breast self-exams. Let your health care provider know about any changes, no matter how small.  If you are in your 20s or 30s, you should have a clinical breast exam (CBE) by a health care provider every 1-3 years as part of a regular health exam.  If you are 40 or older, have a CBE every year. Also consider having a breast X-ray (mammogram) every year.  If you have a family history of breast cancer, talk to your health care provider about genetic screening.  If you   are at high risk for breast cancer, talk to your health care provider about having an MRI and a mammogram every year.  Breast cancer gene (BRCA) assessment is recommended for women who have family members with BRCA-related cancers. BRCA-related cancers include:  Breast.  Ovarian.  Tubal.  Peritoneal cancers.  Results of the assessment will determine the need for genetic counseling and BRCA1 and BRCA2 testing. Cervical Cancer Your health care provider may recommend that you be screened regularly for cancer of the pelvic organs (ovaries, uterus, and  vagina). This screening involves a pelvic examination, including checking for microscopic changes to the surface of your cervix (Pap test). You may be encouraged to have this screening done every 3 years, beginning at age 21.  For women ages 30-65, health care providers may recommend pelvic exams and Pap testing every 3 years, or they may recommend the Pap and pelvic exam, combined with testing for human papilloma virus (HPV), every 5 years. Some types of HPV increase your risk of cervical cancer. Testing for HPV may also be done on women of any age with unclear Pap test results.  Other health care providers may not recommend any screening for nonpregnant women who are considered low risk for pelvic cancer and who do not have symptoms. Ask your health care provider if a screening pelvic exam is right for you.  If you have had past treatment for cervical cancer or a condition that could lead to cancer, you need Pap tests and screening for cancer for at least 20 years after your treatment. If Pap tests have been discontinued, your risk factors (such as having a new sexual partner) need to be reassessed to determine if screening should resume. Some women have medical problems that increase the chance of getting cervical cancer. In these cases, your health care provider may recommend more frequent screening and Pap tests. Colorectal Cancer  This type of cancer can be detected and often prevented.  Routine colorectal cancer screening usually begins at 38 years of age and continues through 38 years of age.  Your health care provider may recommend screening at an earlier age if you have risk factors for colon cancer.  Your health care provider may also recommend using home test kits to check for hidden blood in the stool.  A small camera at the end of a tube can be used to examine your colon directly (sigmoidoscopy or colonoscopy). This is done to check for the earliest forms of colorectal  cancer.  Routine screening usually begins at age 50.  Direct examination of the colon should be repeated every 5-10 years through 38 years of age. However, you may need to be screened more often if early forms of precancerous polyps or small growths are found. Skin Cancer  Check your skin from head to toe regularly.  Tell your health care provider about any new moles or changes in moles, especially if there is a change in a mole's shape or color.  Also tell your health care provider if you have a mole that is larger than the size of a pencil eraser.  Always use sunscreen. Apply sunscreen liberally and repeatedly throughout the day.  Protect yourself by wearing long sleeves, pants, a wide-brimmed hat, and sunglasses whenever you are outside. HEART DISEASE, DIABETES, AND HIGH BLOOD PRESSURE   High blood pressure causes heart disease and increases the risk of stroke. High blood pressure is more likely to develop in:  People who have blood pressure in the high end   of the normal range (130-139/85-89 mm Hg).  People who are overweight or obese.  People who are African American.  If you are 38-23 years of age, have your blood pressure checked every 3-5 years. If you are 61 years of age or older, have your blood pressure checked every year. You should have your blood pressure measured twice--once when you are at a hospital or clinic, and once when you are not at a hospital or clinic. Record the average of the two measurements. To check your blood pressure when you are not at a hospital or clinic, you can use:  An automated blood pressure machine at a pharmacy.  A home blood pressure monitor.  If you are between 45 years and 39 years old, ask your health care provider if you should take aspirin to prevent strokes.  Have regular diabetes screenings. This involves taking a blood sample to check your fasting blood sugar level.  If you are at a normal weight and have a low risk for diabetes,  have this test once every three years after 38 years of age.  If you are overweight and have a high risk for diabetes, consider being tested at a younger age or more often. PREVENTING INFECTION  Hepatitis B  If you have a higher risk for hepatitis B, you should be screened for this virus. You are considered at high risk for hepatitis B if:  You were born in a country where hepatitis B is common. Ask your health care provider which countries are considered high risk.  Your parents were born in a high-risk country, and you have not been immunized against hepatitis B (hepatitis B vaccine).  You have HIV or AIDS.  You use needles to inject street drugs.  You live with someone who has hepatitis B.  You have had sex with someone who has hepatitis B.  You get hemodialysis treatment.  You take certain medicines for conditions, including cancer, organ transplantation, and autoimmune conditions. Hepatitis C  Blood testing is recommended for:  Everyone born from [birth year] through 1963.  Anyone with known risk factors for hepatitis C. Sexually transmitted infections (STIs)  You should be screened for sexually transmitted infections (STIs) including gonorrhea and chlamydia if:  You are sexually active and are younger than 38 years of age.  You are older than 38 years of age and your health care provider tells you that you are at risk for this type of infection.  Your sexual activity has changed since you were last screened and you are at an increased risk for chlamydia or gonorrhea. Ask your health care provider if you are at risk.  If you do not have HIV, but are at risk, it may be recommended that you take a prescription medicine daily to prevent HIV infection. This is called pre-exposure prophylaxis (PrEP). You are considered at risk if:  You are sexually active and do not regularly use condoms or know the HIV status of your partner(s).  You take drugs by injection.  You are sexually  active with a partner who has HIV. Talk with your health care provider about whether you are at high risk of being infected with HIV. If you choose to begin PrEP, you should first be tested for HIV. You should then be tested every 3 months for as long as you are taking PrEP.  PREGNANCY   If you are premenopausal and you may become pregnant, ask your health care provider about preconception counseling.  If you may  become pregnant, take 400 to 800 micrograms (mcg) of folic acid every day.  If you want to prevent pregnancy, talk to your health care provider about birth control (contraception). OSTEOPOROSIS AND MENOPAUSE   Osteoporosis is a disease in which the bones lose minerals and strength with aging. This can result in serious bone fractures. Your risk for osteoporosis can be identified using a bone density scan.  If you are 61 years of age or older, or if you are at risk for osteoporosis and fractures, ask your health care provider if you should be screened.  Ask your health care provider whether you should take a calcium or vitamin D supplement to lower your risk for osteoporosis.  Menopause may have certain physical symptoms and risks.  Hormone replacement therapy may reduce some of these symptoms and risks. Talk to your health care provider about whether hormone replacement therapy is right for you.  HOME CARE INSTRUCTIONS   Schedule regular health, dental, and eye exams.  Stay current with your immunizations.   Do not use any tobacco products including cigarettes, chewing tobacco, or electronic cigarettes.  If you are pregnant, do not drink alcohol.  If you are breastfeeding, limit how much and how often you drink alcohol.  Limit alcohol intake to no more than 1 drink per day for nonpregnant women. One drink equals 12 ounces of beer, 5 ounces of wine, or 1 ounces of hard liquor.  Do not use street drugs.  Do not share needles.  Ask your health care provider for help if  you need support or information about quitting drugs.  Tell your health care provider if you often feel depressed.  Tell your health care provider if you have ever been abused or do not feel safe at home.   This information is not intended to replace advice given to you by your health care provider. Make sure you discuss any questions you have with your health care provider.   Document Released: 01/11/2011 Document Revised: 07/19/2014 Document Reviewed: 05/30/2013 Elsevier Interactive Patient Education [employer].

## 2015-09-08 NOTE — Progress Notes (Signed)
Pre visit review using our clinic review tool, if applicable. No additional management support is needed unless otherwise documented below in the visit note. 

## 2015-09-10 ENCOUNTER — Other Ambulatory Visit: Payer: Self-pay | Admitting: Family Medicine

## 2015-09-10 DIAGNOSIS — M5432 Sciatica, left side: Secondary | ICD-10-CM

## 2015-09-10 MED ORDER — METHYLPREDNISOLONE 4 MG PO TBPK: ORAL_TABLET | ORAL | Status: DC

## 2015-09-10 NOTE — Progress Notes (Signed)
let''s try one more round of the medrol dospak

## 2015-09-26 ENCOUNTER — Other Ambulatory Visit: Payer: Self-pay | Admitting: Internal Medicine

## 2015-09-26 MED ORDER — ALPRAZOLAM 0.25 MG PO TABS: 0.2500 mg | ORAL_TABLET | Freq: Two times a day (BID) | ORAL | Status: DC | PRN

## 2015-09-26 NOTE — Telephone Encounter (Signed)
Last filled 08/23/2015--please advise

## 2015-09-26 NOTE — Telephone Encounter (Signed)
Ok to phone in Xanax 

## 2015-09-26 NOTE — Telephone Encounter (Signed)
Rx called in to pharmacy. 

## 2015-10-13 ENCOUNTER — Encounter: Payer: Self-pay | Admitting: Neurology

## 2015-10-13 NOTE — Telephone Encounter (Signed)
FYI only.

## 2015-11-01 DIAGNOSIS — M25572 Pain in left ankle and joints of left foot: Secondary | ICD-10-CM | POA: Diagnosis not present

## 2015-11-03 ENCOUNTER — Encounter: Payer: Self-pay | Admitting: Internal Medicine

## 2015-11-03 NOTE — Telephone Encounter (Signed)
Pt has appt scheduled tomorrow 11/04/15

## 2015-11-04 ENCOUNTER — Ambulatory Visit: Payer: Federal, State, Local not specified - PPO | Admitting: Internal Medicine

## 2015-11-04 ENCOUNTER — Other Ambulatory Visit: Payer: Self-pay | Admitting: Internal Medicine

## 2015-11-04 DIAGNOSIS — Z0289 Encounter for other administrative examinations: Secondary | ICD-10-CM

## 2015-11-04 NOTE — Telephone Encounter (Signed)
Denied. Pt needs ov

## 2015-11-04 NOTE — Telephone Encounter (Signed)
Last filled 09-26-15

## 2015-11-10 ENCOUNTER — Ambulatory Visit: Payer: Federal, State, Local not specified - PPO | Admitting: Internal Medicine

## 2015-11-10 DIAGNOSIS — Z0289 Encounter for other administrative examinations: Secondary | ICD-10-CM

## 2015-11-12 ENCOUNTER — Encounter: Payer: Self-pay | Admitting: Internal Medicine

## 2015-11-12 ENCOUNTER — Other Ambulatory Visit: Payer: Self-pay | Admitting: Internal Medicine

## 2015-11-12 MED ORDER — ALPRAZOLAM 0.25 MG PO TABS: 0.2500 mg | ORAL_TABLET | Freq: Two times a day (BID) | ORAL | Status: DC | PRN

## 2015-11-12 NOTE — Telephone Encounter (Signed)
Last filled 09/26/15--please advise

## 2015-11-12 NOTE — Telephone Encounter (Signed)
Ok to phone in Xanax 

## 2015-11-12 NOTE — Telephone Encounter (Signed)
Rx called in to pharmacy. 

## 2015-12-01 ENCOUNTER — Ambulatory Visit (INDEPENDENT_AMBULATORY_CARE_PROVIDER_SITE_OTHER): Payer: Federal, State, Local not specified - PPO | Admitting: Neurology

## 2015-12-01 ENCOUNTER — Encounter: Payer: Self-pay | Admitting: Neurology

## 2015-12-01 VITALS — BP 120/84 | HR 85 | Ht 67.5 in | Wt 259.1 lb

## 2015-12-01 DIAGNOSIS — G43009 Migraine without aura, not intractable, without status migrainosus: Secondary | ICD-10-CM | POA: Diagnosis not present

## 2015-12-01 MED ORDER — ZONISAMIDE 50 MG PO CAPS: 150.0000 mg | ORAL_CAPSULE | Freq: Every day | ORAL | Status: DC

## 2015-12-01 NOTE — Progress Notes (Signed)
NEUROLOGY FOLLOW UP OFFICE NOTE  Linda [name] [id number]  HISTORY OF PRESENT ILLNESS: Linda [name] is a 40 year old right-handed woman with history of hysterectomy, migraine and panic attacks who follows up for migraine.  UPDATE: Zonisamide was helpful.  She wasn't having any headaches, however over the past 4 weeks, they have become once a week.  She is tolerating it well except for some tiredness. Intensity:  6-10/10 Duration:  brief with Zomig Frequency:  once a week Current abortive medication:  Zomig 5mg  po with naproxen 500mg  Antihypertensive medications:  none Antidepressant medications:  none Anticonvulsant medications:  zonisamide 100mg  Vitamins/Herbal/Supplements:  MVI Other therapy:  none Other medication:  alprazolam, pantoprazole  CBC and CMP from 09/02/15 were normal.  HISTORY: Onset:  Around age 76 Location:  Varies.  Either temple, bi-frontal, sometimes radiating down the right side of neck Quality:  Migraines are severe hammer.  Daily headaches are dull throbbing Initial Intensity:  Migraines are 10/10.  Daily headaches 3/10; January 10/10 Aura:  no Prodrome:  no Associated symptoms:  Nausea, phonophobia, osmophobia, sometimes sees spots, sometimes vomiting.    Initial Duration:  3-5 days; January 1 day Initial Frequency:  Once a month (but has daily headache); January 3 to 4 days per month Triggers/exacerbating factors:  Wine, hunger, scents Relieving factors:  none Activity:  Cannot function with migraines  Past abortive therapy:  Fioricet, Maxalt 5mg  (initially helpful), Relpax (ineffective), sumatriptan 50mg  po (caused head burning), Exedrin, ibuprofen, naproxen, Midrin Past preventative therapy:  Topamax (side effects)  Current abortive therapy:  none Current preventative therapy:  none Other medications:  citalopram 10mg  (just started 6 days ago)  Caffeine:  Tea every other day Alcohol:  rarely   Smoker:  no Diet:  Poor.  Stays  hydrated Exercise:  no Depression/stress:  She has been experiencing panic attacks.  Just started citalopram. Sleep hygiene:  Poor.  Prolonged sleep latency.  Wakes up often.  About 3-4 hours per night Family history of headache:  no  She had a CT of the head performed on 09/08/06 for headache, which was reviewed, which was unremarkable.  It did reveal a punctate calcification along the right transverse sinus, within normal limits, and is showed some fluid in the mastoid air cells bilaterally.  PAST MEDICAL HISTORY: Past Medical History  Diagnosis Date  . Bleeding     POST PARTUM BLEEDING  . Migraine   . Bronchitis with flu 06/12/11  . Heart murmur     while pregnant  . Anxiety   . Asthma     MEDICATIONS: Current Outpatient Prescriptions on File Prior to Visit  Medication Sig Dispense Refill  . albuterol (PROVENTIL HFA;VENTOLIN HFA) 108 (90 Base) MCG/ACT inhaler Inhale 2 puffs into the lungs every 6 (six) hours as needed for wheezing or shortness of breath. 1 Inhaler 2  . ALPRAZolam (XANAX) 0.25 MG tablet Take 1 tablet (0.25 mg total) by mouth 2 (two) times daily as needed. 20 tablet 0  . cyclobenzaprine (FLEXERIL) 5 MG tablet Take 1 tablet (5 mg total) by mouth 3 (three) times daily as needed for muscle spasms. 30 tablet 0  . HYDROcodone-acetaminophen (NORCO/VICODIN) 5-325 MG tablet Take 1 tablet by mouth every 6 (six) hours as needed for moderate pain. 20 tablet 0  . methylPREDNISolone (MEDROL DOSEPAK) 4 MG TBPK tablet As directe 21 tablet 0  . Multiple Vitamin (MULTIVITAMIN) tablet Take 1 tablet by mouth daily.    . pantoprazole (PROTONIX) 40 MG tablet Take 1 tablet (  40 mg total) by mouth daily. 30 tablet 3  . zolmitriptan (ZOMIG) 5 MG tablet Take one tablet by mouth at on set of headache can take one more tablet 2 hours later if needed 10 tablet 11   No current facility-administered medications on file prior to visit.    ALLERGIES: No Known Allergies  FAMILY HISTORY: Family  History  Problem Relation Age of Onset  . Hypertension Father   . Diabetes Father   . Hypertension Mother   . Cancer Mother     uterine  . Stroke Mother   . Diabetes Maternal Aunt   . Diabetes Maternal Uncle   . Cancer Maternal Grandmother     PANCREATIC  . Hypertension Maternal Grandmother   . Diabetes Maternal Grandmother   . Cancer Maternal Grandfather     LUNG  . Hypertension Maternal Grandfather   . Diabetes Maternal Grandfather   . Cancer Paternal Grandmother     THROAT  . Alzheimer's disease Paternal Grandmother     SOCIAL HISTORY: Social History   Social History  . Marital Status: Divorced    Spouse Name: N/A  . Number of Children: N/A  . Years of Education: N/A   Occupational History  . Not on file.   Social History Main Topics  . Smoking status: Never Smoker   . Smokeless tobacco: Never Used  . Alcohol Use: 0.6 oz/week    1 Standard drinks or equivalent per week     Comment: occasional (not much)  . Drug Use: No  . Sexual Activity: Not on file   Other Topics Concern  . Not on file   Social History Narrative    REVIEW OF SYSTEMS: Constitutional: No fevers, chills, or sweats, no generalized fatigue, change in appetite Eyes: No visual changes, double vision, eye pain Ear, nose and throat: No hearing loss, ear pain, nasal congestion, sore throat Cardiovascular: No chest pain, palpitations Respiratory:  No shortness of breath at rest or with exertion, wheezes GastrointestinaI: No nausea, vomiting, diarrhea, abdominal pain, fecal incontinence Genitourinary:  No dysuria, urinary retention or frequency Musculoskeletal:  No neck pain, back pain Integumentary: No rash, pruritus, skin lesions Neurological: as above Psychiatric: No depression, insomnia, anxiety Endocrine: No palpitations, fatigue, diaphoresis, mood swings, change in appetite, change in weight, increased thirst Hematologic/Lymphatic:  No purpura, petechiae. Allergic/Immunologic: no  itchy/runny eyes, nasal congestion, recent allergic reactions, rashes  PHYSICAL EXAM: Filed Vitals:   12/01/15 0748  BP: 120/84  Pulse: 85   General: No acute distress.  Patient appears well-groomed.  obese body habitus. Head:  Normocephalic/atraumatic Eyes:  Fundi examined but not visualized Neck: supple, no paraspinal tenderness, full range of motion Heart:  Regular rate and rhythm Lungs:  Clear to auscultation bilaterally Back: No paraspinal tenderness Neurological Exam: alert and oriented to person, place, and time. Attention span and concentration intact, recent and remote memory intact, fund of knowledge intact.  Speech fluent and not dysarthric, language intact.  CN II-XII intact. Bulk and tone normal, muscle strength 5/5 throughout.  Sensation to light touch, temperature and vibration intact.  Deep tendon reflexes 2+ throughout.  Finger to nose and heel to shin testing intact.  Gait normal  IMPRESSION: Migraine without aura  PLAN: Increase zonisamide to 150mg  at bedtime.  She is to contact me in 4 weeks with update. Zomig as needed Follow up in 6 months.  15 minutes spent face to face with patient, over 50% spent discussing management.  Metta Clines, DO  CC:  Webb Silversmith,  NP

## 2015-12-01 NOTE — Patient Instructions (Signed)
1.  Increase zonisamide to 150mg  at bedtime.  I prescribed you 50mg  pills, so take 3 at bedtime.  Contact me in 4 weeks with update 2.  Continue Zomig 5mg  as needed. 3.  Follow up in 6 months.

## 2015-12-02 ENCOUNTER — Telehealth: Payer: Self-pay

## 2015-12-02 NOTE — Telephone Encounter (Signed)
Per covermymeds, medication has been approved Case ID: [id number] Pt notified via mychart msg

## 2015-12-02 NOTE — Telephone Encounter (Signed)
PA submitted through [email or web address]---awaiting on response

## 2015-12-03 NOTE — Telephone Encounter (Signed)
Approve through 12/01/2016

## 2015-12-11 ENCOUNTER — Ambulatory Visit: Payer: Federal, State, Local not specified - PPO | Admitting: Neurology

## 2015-12-29 ENCOUNTER — Other Ambulatory Visit: Payer: Self-pay | Admitting: Internal Medicine

## 2015-12-30 MED ORDER — ALPRAZOLAM 0.25 MG PO TABS: 0.2500 mg | ORAL_TABLET | Freq: Two times a day (BID) | ORAL | Status: DC | PRN

## 2015-12-30 NOTE — Telephone Encounter (Signed)
Ok to phone in Xanax 

## 2015-12-30 NOTE — Telephone Encounter (Signed)
Last filled 12/02/15--please advise

## 2015-12-31 ENCOUNTER — Other Ambulatory Visit: Payer: Self-pay | Admitting: Internal Medicine

## 2015-12-31 NOTE — Telephone Encounter (Signed)
Rx called in to pharmacy. 

## 2016-01-20 DIAGNOSIS — H66002 Acute suppurative otitis media without spontaneous rupture of ear drum, left ear: Secondary | ICD-10-CM | POA: Diagnosis not present

## 2016-01-20 DIAGNOSIS — H6983 Other specified disorders of Eustachian tube, bilateral: Secondary | ICD-10-CM | POA: Diagnosis not present

## 2016-01-22 DIAGNOSIS — K219 Gastro-esophageal reflux disease without esophagitis: Secondary | ICD-10-CM | POA: Diagnosis not present

## 2016-01-22 DIAGNOSIS — R1310 Dysphagia, unspecified: Secondary | ICD-10-CM | POA: Diagnosis not present

## 2016-01-26 ENCOUNTER — Other Ambulatory Visit (HOSPITAL_COMMUNITY): Payer: Self-pay | Admitting: General Surgery

## 2016-02-02 ENCOUNTER — Ambulatory Visit (HOSPITAL_COMMUNITY)
Admission: RE | Admit: 2016-02-02 | Discharge: 2016-02-02 | Disposition: A | Discharge status: Home / Self Care | Payer: Federal, State, Local not specified - PPO | Source: Ambulatory Visit | Attending: General Surgery | Admitting: General Surgery

## 2016-02-02 ENCOUNTER — Other Ambulatory Visit: Payer: Self-pay

## 2016-02-02 DIAGNOSIS — K219 Gastro-esophageal reflux disease without esophagitis: Secondary | ICD-10-CM | POA: Insufficient documentation

## 2016-02-02 DIAGNOSIS — Z01818 Encounter for other preprocedural examination: Secondary | ICD-10-CM | POA: Diagnosis not present

## 2016-02-03 ENCOUNTER — Other Ambulatory Visit: Payer: Self-pay | Admitting: Internal Medicine

## 2016-02-03 MED ORDER — ALPRAZOLAM 0.25 MG PO TABS: 0.2500 mg | ORAL_TABLET | Freq: Two times a day (BID) | ORAL | 0 refills | Status: DC | PRN

## 2016-02-03 NOTE — Telephone Encounter (Signed)
Last filled 12/30/15--please advise

## 2016-02-03 NOTE — Telephone Encounter (Signed)
Rx called into pharmacy mychart msg sent to pt asking her to come in to sign a controlled substance contract before her next refill request---UDS will be needed as well

## 2016-02-03 NOTE — Telephone Encounter (Signed)
Ok to phone in Xanax. Does she have a UDS on file?

## 2016-02-18 ENCOUNTER — Encounter: Payer: Self-pay | Admitting: Dietician

## 2016-02-18 ENCOUNTER — Encounter: Payer: Federal, State, Local not specified - PPO | Attending: General Surgery | Admitting: Dietician

## 2016-02-18 DIAGNOSIS — Z6838 Body mass index (BMI) 38.0-38.9, adult: Secondary | ICD-10-CM | POA: Diagnosis not present

## 2016-02-18 DIAGNOSIS — Z79899 Other long term (current) drug therapy: Secondary | ICD-10-CM | POA: Diagnosis not present

## 2016-02-18 DIAGNOSIS — Z713 Dietary counseling and surveillance: Secondary | ICD-10-CM | POA: Diagnosis not present

## 2016-02-18 DIAGNOSIS — E669 Obesity, unspecified: Secondary | ICD-10-CM

## 2016-02-18 NOTE — Progress Notes (Signed)
  Pre-Op Assessment Visit:  Pre-Operative RYGBSurgery  Medical Nutrition Therapy:  Appt start time: [id number]   End time:  [id number].  Patient was seen on 02/18/2016 for Pre-Operative Nutrition Assessment. Assessment and letter of approval faxed to [facility] coordinator on 02/18/2016.   Preferred Learning Style:   No preference indicated   Learning Readiness:   Ready  Handouts given during visit include:  Pre-Op Goals Bariatric Surgery Protein Shakes   During the appointment today the following Pre-Op Goals were reviewed with the patient: Maintain or lose weight as instructed by your surgeon Make healthy food choices Begin to limit portion sizes Limited concentrated sugars and fried foods Keep fat/sugar in the single digits per serving on   food labels Practice CHEWING your food  (aim for 30 chews per bite or until applesauce consistency) Practice not drinking 15 minutes before, during, and 30 minutes after each meal/snack Avoid all carbonated beverages  Avoid/limit caffeinated beverages  Avoid all sugar-sweetened beverages Consume 3 meals per day; eat every 3-5 hours Make a list of non-food related activities Aim for 64-100 ounces of FLUID daily  Aim for at least 60-80 grams of PROTEIN daily Look for a liquid protein source that contain ?15 g protein and ?5 g carbohydrate  (ex: shakes, drinks, shots)  Patient-Centered Goals: Be healthy for her kids  10 confidence/10 importance scale   Demonstrated degree of understanding via:  Teach Back  Teaching Method Utilized:  Visual Auditory Hands on  Barriers to learning/adherence to lifestyle change: none  [facility] to call the [facility] to enroll in Pre-Op and Post-Op Nutrition Education when surgery date is scheduled.

## 2016-02-18 NOTE — Patient Instructions (Signed)
Follow Pre-Op Goals Try Protein Shakes Call [facility] at [phone] when surgery is scheduled to enroll in Pre-Op Class  Things to remember:  Please always be honest with us. We want to support you!  If you have any questions or concerns in between appointments, please call or email Liz, Leslie, or Laurie.  The diet after surgery will be high protein and low in carbohydrate.  Vitamins and calcium need to be taken for the rest of your life.  Feel free to include support people in any classes or appointments.   Supplement recommendations:  Complete" Multivitamin: Sleeve Gastrectomy and RYGB patients take a double dose of MVI. LAGB patients take single dose as it is written on the package. Vitamin must be liquid or chewable but not gummy. Examples of these include Flintstones Complete and Centrum Complete. If the vitamin is bariatric-specific, take 1 dose as it is already formulated for bariatric surgery patients. Examples of these are Bariatric Advantage, Celebrate, and Wellesse. These can be found at the [facility] and/or online.     Calcium citrate: 1500 mg/day of Calcium citrate (also chewable or liquid) is recommended for all procedures. The body is only able to absorb 500-600 mg of Calcium at one time so 3 daily doses of 500 mg are recommended. Calcium doses must be taken a minimum of 2 hours apart. Additionally, Calcium must be taken 2 hours apart from iron-containing MVI. Examples of brands include Celebrate, Bariatric Advantage, and Wellesse. These brands must be purchased online or at the [facility]. Citracal Petites is the only Calcium citrate supplement found in general grocery stores and pharmacies. This is in tablet form and may be recommended for patients who do not tolerate chewable Calcium.  Continued or added Vitamin D supplementation based on individual needs.    Vitamin B12: 300-500 mcg/day for Sleeve Gastrectomy and RYGB. Optional for  LAGB patients as stomach remains fully intact. Must be taken intramuscularly, sublingually, or inhaled nasally. Oral route is not recommended. 

## 2016-02-19 DIAGNOSIS — F509 Eating disorder, unspecified: Secondary | ICD-10-CM | POA: Diagnosis not present

## 2016-02-27 ENCOUNTER — Encounter: Payer: Self-pay | Admitting: Dietician

## 2016-03-01 ENCOUNTER — Encounter: Payer: Self-pay | Admitting: Internal Medicine

## 2016-03-03 ENCOUNTER — Other Ambulatory Visit: Payer: Self-pay | Admitting: Internal Medicine

## 2016-03-16 ENCOUNTER — Other Ambulatory Visit: Payer: Self-pay

## 2016-03-16 ENCOUNTER — Encounter: Payer: Self-pay | Admitting: Internal Medicine

## 2016-03-16 ENCOUNTER — Encounter: Payer: Federal, State, Local not specified - PPO | Attending: General Surgery | Admitting: Dietician

## 2016-03-16 DIAGNOSIS — Z6838 Body mass index (BMI) 38.0-38.9, adult: Secondary | ICD-10-CM | POA: Diagnosis not present

## 2016-03-16 DIAGNOSIS — Z713 Dietary counseling and surveillance: Secondary | ICD-10-CM | POA: Insufficient documentation

## 2016-03-16 DIAGNOSIS — E669 Obesity, unspecified: Secondary | ICD-10-CM

## 2016-03-16 DIAGNOSIS — Z79899 Other long term (current) drug therapy: Secondary | ICD-10-CM | POA: Insufficient documentation

## 2016-03-16 MED ORDER — ALPRAZOLAM 0.25 MG PO TABS
0.2500 mg | ORAL_TABLET | Freq: Two times a day (BID) | ORAL | 0 refills | Status: DC | PRN
Start: 2016-03-16 — End: 2016-05-14

## 2016-03-16 NOTE — Patient Instructions (Addendum)
Plan to walk 30 minutes 3 x week.  Start filling plate with more vegetable and less carbs. Continue working on not drink before and after meals. Continue working on chewing well.

## 2016-03-16 NOTE — Telephone Encounter (Signed)
Sorry, xanax

## 2016-03-16 NOTE — Telephone Encounter (Signed)
Last filled 02/03/16--please advise--pt is in office to sign CSA

## 2016-03-16 NOTE — Telephone Encounter (Signed)
Rx called in to pharmacy. 

## 2016-03-16 NOTE — Telephone Encounter (Signed)
Ok to phone in Xanax 

## 2016-03-16 NOTE — Addendum Note (Signed)
Addended by: Lurlean Nanny on: 03/16/2016 03:45 PM   Modules accepted: Orders

## 2016-03-16 NOTE — Progress Notes (Signed)
  6 Months Supervised Weight Loss Visit:   Pre-Operative RYGB Surgery  Medical Nutrition Therapy:  Appt start time: 1400 end time:  [id number].  Primary concerns today: Supervised Weight Loss Visit. Returns with no change in her weight. Weight has been fluctuating. Has been trying to not drink during meals and waiting after meals. Also has been working on chewing well. Started eating breakfast. Tried Premier Protein shakes.   Doesn't eat sweets but feels like fried foods are a problem for her.  Trying to eat more vegetables to set a good example for her daughters.   Weight: 249 lbs BMI: 38.4  Preferred Learning Style:   No preference indicated   Learning Readiness:   Ready  24-hr recall: B (AM): protein shake or boiled eggs  Snk (AM): none  L (PM): leftovers - whole wheat spaghetti with lean beef or grilled meat with vegetables Snk (PM): none D (PM): whole wheat spaghetti with lean beef or grilled meat with vegetables Snk (PM): nutrigrain bar or yogurt or applesauce  Beverages: water, tea 3 cups per week   Medications: see list  Recent physical activity:  None recently  Progress Towards Goal(s):  In progress.  Handouts given during visit include:  none   Nutritional Diagnosis:  [place]-3.3 Obesity related to past poor dietary habits and physical inactivity as evidenced by patient attending supervised weight loss for insurance approval of bariatric surgery.    Intervention:  Nutrition counseling provded. Plan: Plan to walk 30 minutes 3 x week.  Start filling plate with more vegetable and less carbs. Continue working on not drink before and after meals. Continue working on chewing well.  Teaching Method Utilized:  Visual Auditory Hands on  Barriers to learning/adherence to lifestyle change: none  Demonstrated degree of understanding via:  Teach Back   Monitoring/Evaluation:  Dietary intake, exercise, and body weight. Follow up in 1 months for 6 month supervised weight  loss visit.

## 2016-03-16 NOTE — Telephone Encounter (Signed)
Is this for the Xanax or Norco?

## 2016-03-17 ENCOUNTER — Ambulatory Visit (HOSPITAL_BASED_OUTPATIENT_CLINIC_OR_DEPARTMENT_OTHER): Payer: Federal, State, Local not specified - PPO | Attending: General Surgery | Admitting: Internal Medicine

## 2016-03-17 VITALS — Ht 67.0 in | Wt 249.0 lb

## 2016-03-17 DIAGNOSIS — E669 Obesity, unspecified: Secondary | ICD-10-CM | POA: Insufficient documentation

## 2016-03-17 DIAGNOSIS — R5383 Other fatigue: Secondary | ICD-10-CM | POA: Diagnosis not present

## 2016-03-17 DIAGNOSIS — Z6839 Body mass index (BMI) 39.0-39.9, adult: Secondary | ICD-10-CM | POA: Insufficient documentation

## 2016-03-17 DIAGNOSIS — R0683 Snoring: Secondary | ICD-10-CM | POA: Diagnosis not present

## 2016-03-17 DIAGNOSIS — Z79899 Other long term (current) drug therapy: Secondary | ICD-10-CM | POA: Insufficient documentation

## 2016-03-17 DIAGNOSIS — G4733 Obstructive sleep apnea (adult) (pediatric): Secondary | ICD-10-CM | POA: Diagnosis not present

## 2016-03-20 DIAGNOSIS — G4733 Obstructive sleep apnea (adult) (pediatric): Secondary | ICD-10-CM | POA: Diagnosis not present

## 2016-03-20 NOTE — Procedures (Signed)
Create Note    My Note 03/17/2016  Edit                                                                                                    Patient Name: Linda [name], Linda [name] Date: 03/17/2016 Gender: Female D.O.B: 05-24-1976 Age (years): 40 Referring Provider: Lurena Joiner Kinsinger Height (inches): 27 Interpreting Physician: Baird Lyons MD, ABSM Weight (lbs): 249 RPSGT: Carolin Coy BMI: 86 MRN: [name] Neck Size: 14.50  CLINICAL INFORMATION Sleep Study Type: NPSG Indication for sleep study: Fatigue, Obesity, Snoring Epworth Sleepiness Score: 9  SLEEP STUDY TECHNIQUE As per the AASM Manual for the Scoring of Sleep and Associated Events v2.3 (April 2016) with a hypopnea requiring 4% desaturations. The channels recorded and monitored were frontal, central and occipital EEG, electrooculogram (EOG), submentalis EMG (chin), nasal and oral airflow, thoracic and abdominal wall motion, anterior tibialis EMG, snore microphone, electrocardiogram, and pulse oximetry.  MEDICATIONS Patient's medications include: charted for review. Medications self-administered by patient during sleep study : benadryl  SLEEP ARCHITECTURE The study was initiated at 10:54:05 PM and ended at 4:54:02 AM. Sleep onset time was 6.2 minutes and the sleep efficiency was 92.7%. The total sleep time was 333.5 minutes. Stage REM latency was 119.0 minutes. The patient spent 7.35% of the night in stage N1 sleep, 79.16% in stage N2 sleep, 0.00% in stage N3 and 13.49% in REM. Alpha intrusion was absent. Supine sleep was 60.65%.  RESPIRATORY PARAMETERS The overall apnea/hypopnea index (AHI) was 3.1 per hour. There were 1 total apneas, including 1 obstructive, 0 central and 0 mixed apneas. There were 16 hypopneas and 15 RERAs. The AHI during Stage REM sleep was 18.7 per hour. AHI while supine was 3.9 per hour. The mean oxygen saturation was 95.91%. The minimum SpO2 during sleep was  92.00%. Moderate snoring was noted during this study.  CARDIAC DATA The 2 lead EKG demonstrated sinus rhythm. The mean heart rate was 74.39 beats per minute. Other EKG findings include: None.  LEG MOVEMENT DATA The total PLMS were 0 with a resulting PLMS index of 0.00. Associated arousal with leg movement index was 0.0 .  IMPRESSIONS - No significant obstructive sleep apnea occurred during this study (AHI = 3.1/h). - No significant central sleep apnea occurred during this study (CAI = 0.0/h). - The patient had minimal or no oxygen desaturation during the study (Min O2 = 92.00%) - The patient snored with Moderate snoring volume. - No cardiac abnormalities were noted during this study. - Clinically significant periodic limb movements did not occur during sleep. No significant associated arousals.  DIAGNOSIS - Primary Snoring (786.09 [R06.83 ICD-10]) - Normal study  RECOMMENDATIONS - Avoid alcohol, sedatives and other CNS depressants that may worsen sleep apnea and disrupt normal sleep architecture. - Sleep hygiene should be reviewed to assess factors that may improve sleep quality. - Weight management and regular exercise should be initiated or continued if appropriate.  [Electronically signed] 03/20/2016 02:31 PM  Baird Lyons MD, [place], American Board of Sleep Medicine   NPI: [id number],  American Board of Sleep Medicine  ELECTRONICALLY SIGNED ON:  03/20/2016, 2:23 PM [place] PH: [phone]   FX: [phone] [place] SIGNED ON:  03/20/2016, 6:19 PM [place] PH: [phone]   FX: [phone] [place]

## 2016-03-20 NOTE — Progress Notes (Signed)
   Patient Name: Linda [name], Linda [name] Date: 03/17/2016 Gender: Female D.O.B: 1976-03-06 Age (years): 40 Referring Provider: Lurena Joiner Kinsinger Height (inches): 47 Interpreting Physician: Baird Lyons MD, ABSM Weight (lbs): 249 RPSGT: Carolin Coy BMI: 30 MRN: [id number] Neck Size: 14.50  CLINICAL INFORMATION Sleep Study Type: NPSG Indication for sleep study: Fatigue, Obesity, Snoring Epworth Sleepiness Score: 9  SLEEP STUDY TECHNIQUE As per the AASM Manual for the Scoring of Sleep and Associated Events v2.3 (April 2016) with a hypopnea requiring 4% desaturations. The channels recorded and monitored were frontal, central and occipital EEG, electrooculogram (EOG), submentalis EMG (chin), nasal and oral airflow, thoracic and abdominal wall motion, anterior tibialis EMG, snore microphone, electrocardiogram, and pulse oximetry.  MEDICATIONS Patient's medications include: charted for review. Medications self-administered by patient during sleep study : benadryl  SLEEP ARCHITECTURE The study was initiated at 10:54:05 PM and ended at 4:54:02 AM. Sleep onset time was 6.2 minutes and the sleep efficiency was 92.7%. The total sleep time was 333.5 minutes. Stage REM latency was 119.0 minutes. The patient spent 7.35% of the night in stage N1 sleep, 79.16% in stage N2 sleep, 0.00% in stage N3 and 13.49% in REM. Alpha intrusion was absent. Supine sleep was 60.65%.  RESPIRATORY PARAMETERS The overall apnea/hypopnea index (AHI) was 3.1 per hour. There were 1 total apneas, including 1 obstructive, 0 central and 0 mixed apneas. There were 16 hypopneas and 15 RERAs. The AHI during Stage REM sleep was 18.7 per hour. AHI while supine was 3.9 per hour. The mean oxygen saturation was 95.91%. The minimum SpO2 during sleep was 92.00%. Moderate snoring was noted during this study.  CARDIAC DATA The 2 lead EKG demonstrated sinus rhythm. The mean heart rate was 74.39 beats per minute. Other EKG  findings include: None.  LEG MOVEMENT DATA The total PLMS were 0 with a resulting PLMS index of 0.00. Associated arousal with leg movement index was 0.0 .  IMPRESSIONS - No significant obstructive sleep apnea occurred during this study (AHI = 3.1/h). - No significant central sleep apnea occurred during this study (CAI = 0.0/h). - The patient had minimal or no oxygen desaturation during the study (Min O2 = 92.00%) - The patient snored with Moderate snoring volume. - No cardiac abnormalities were noted during this study. - Clinically significant periodic limb movements did not occur during sleep. No significant associated arousals.  DIAGNOSIS - Primary Snoring (786.09 [R06.83 ICD-10]) - Normal study  RECOMMENDATIONS - Avoid alcohol, sedatives and other CNS depressants that may worsen sleep apnea and disrupt normal sleep architecture. - Sleep hygiene should be reviewed to assess factors that may improve sleep quality. - Weight management and regular exercise should be initiated or continued if appropriate.  [Electronically signed] 03/20/2016 02:31 PM  Baird Lyons MD, [place], American Board of Sleep Medicine   NPI: [id number] [place], [place] of Sleep Medicine  ELECTRONICALLY SIGNED ON:  03/20/2016, 2:23 PM [place] PH: [phone]   FX: [phone] [place]

## 2016-03-22 ENCOUNTER — Ambulatory Visit: Payer: Federal, State, Local not specified - PPO | Admitting: Dietician

## 2016-03-31 ENCOUNTER — Encounter: Payer: Self-pay | Admitting: Neurology

## 2016-03-31 ENCOUNTER — Ambulatory Visit (INDEPENDENT_AMBULATORY_CARE_PROVIDER_SITE_OTHER): Payer: Federal, State, Local not specified - PPO

## 2016-03-31 DIAGNOSIS — G43009 Migraine without aura, not intractable, without status migrainosus: Secondary | ICD-10-CM | POA: Diagnosis not present

## 2016-03-31 MED ORDER — DIPHENHYDRAMINE HCL 50 MG/ML IJ SOLN
12.5000 mg | Freq: Once | INTRAMUSCULAR | Status: AC
  Administered 2016-03-31: 12.5 mg via INTRAMUSCULAR

## 2016-03-31 MED ORDER — METOCLOPRAMIDE HCL 5 MG/ML IJ SOLN: 10.0000 mg | Freq: Once | INTRAMUSCULAR | Status: DC

## 2016-03-31 MED ORDER — ZONISAMIDE 50 MG PO CAPS: 200.0000 mg | ORAL_CAPSULE | Freq: Every day | ORAL | 1 refills | Status: DC

## 2016-03-31 MED ORDER — METOCLOPRAMIDE HCL 5 MG/ML IJ SOLN
10.0000 mg | Freq: Once | INTRAVENOUS | Status: AC
  Administered 2016-03-31: 10 mg via INTRAMUSCULAR

## 2016-03-31 MED ORDER — KETOROLAC TROMETHAMINE 30 MG/ML IJ SOLN
30.0000 mg | Freq: Once | INTRAMUSCULAR | Status: AC
  Administered 2016-03-31: 30 mg via INTRAMUSCULAR

## 2016-03-31 NOTE — Telephone Encounter (Signed)
Pt called. Requesting migraine cocktail. States she has had a migraine since Sunday. Has taken Zomig x 2 on Sunday night/Monday morning with no relief. Pt stated the second dose made it worse. Pt stated she has been having breakthrough headaches for about the last month, which is slightly concerning for her as she had been doing really well.   Pt last seen 12/01/15 w/ f/u scheduled for 05/25/16.  Pt currently taking 150 mg of Zonegran QHS. Please advise.

## 2016-03-31 NOTE — Telephone Encounter (Signed)
Mychart reply sent. Increased medication dose sent to pharmacy.

## 2016-04-08 DIAGNOSIS — F509 Eating disorder, unspecified: Secondary | ICD-10-CM | POA: Diagnosis not present

## 2016-04-12 ENCOUNTER — Encounter: Payer: Federal, State, Local not specified - PPO | Attending: General Surgery | Admitting: Dietician

## 2016-04-12 DIAGNOSIS — Z6838 Body mass index (BMI) 38.0-38.9, adult: Secondary | ICD-10-CM | POA: Insufficient documentation

## 2016-04-12 DIAGNOSIS — Z713 Dietary counseling and surveillance: Secondary | ICD-10-CM | POA: Diagnosis not present

## 2016-04-12 DIAGNOSIS — E669 Obesity, unspecified: Secondary | ICD-10-CM

## 2016-04-12 DIAGNOSIS — Z79899 Other long term (current) drug therapy: Secondary | ICD-10-CM | POA: Insufficient documentation

## 2016-04-12 NOTE — Progress Notes (Signed)
  6 Months Supervised Weight Loss Visit:   Pre-Operative RYGB Surgery  Medical Nutrition Therapy:  Appt start time: 1600 end time:  1615  Primary concerns today: Supervised Weight Loss Visit. Returns with a 1 lb weight gain. Has respiratory infection about 2 weeks ago and was taking an antibiotic and prednisone. Still feels a little sick. Felt like she was doing well before she got sick. Had been doing tennis for exercise.   Feels like she is more conscious of chewing well. Doing better about not drinking during meals.Working on not drinking within 30 minutes after meals.   Doesn't have an appetite so hasn't been eating fried foods. Still eating more vegetables.   Weight: 250 lbs BMI: 38.6  Preferred Learning Style:   No preference indicated   Learning Readiness:   Ready  24-hr recall: B (AM): protein shake or boiled eggs  Snk (AM): none  L (PM): leftovers - whole wheat spaghetti with lean beef or grilled meat with vegetables Snk (PM): none D (PM): whole wheat spaghetti with lean beef or grilled meat with vegetables Snk (PM): nutrigrain bar or yogurt or applesauce  Beverages: water, tea 3 cups per week   Medications: see list  Recent physical activity:  None recently  Progress Towards Goal(s):  In progress.  Handouts given during visit include:  none   Nutritional Diagnosis:  [place]-3.3 Obesity related to past poor dietary habits and physical inactivity as evidenced by patient attending supervised weight loss for insurance approval of bariatric surgery.    Intervention:  Nutrition counseling provded. Plan: Work on being consistent with meals. Continue to do tennis 2 x week and walk if you have time.(total 3 x week) Continue working on not drinking 30 minutes after meals. Continue working on chewing well.  Teaching Method Utilized:  Visual Auditory Hands on  Barriers to learning/adherence to lifestyle change: none  Demonstrated degree of understanding via:  Teach  Back   Monitoring/Evaluation:  Dietary intake, exercise, and body weight. Follow up in 1 months for 6 month supervised weight loss visit.

## 2016-04-12 NOTE — Patient Instructions (Addendum)
Work on being consistent with meals. Continue to do tennis 2 x week and walk if you have time.(total 3 x week) Continue working on not drinking 30 minutes after meals. Continue working on chewing well.

## 2016-04-18 ENCOUNTER — Encounter: Payer: Self-pay | Admitting: Internal Medicine

## 2016-04-19 ENCOUNTER — Ambulatory Visit: Payer: Federal, State, Local not specified - PPO | Admitting: Internal Medicine

## 2016-04-20 ENCOUNTER — Encounter: Payer: Self-pay | Admitting: Internal Medicine

## 2016-04-20 ENCOUNTER — Ambulatory Visit (INDEPENDENT_AMBULATORY_CARE_PROVIDER_SITE_OTHER): Payer: Federal, State, Local not specified - PPO | Admitting: Internal Medicine

## 2016-04-20 VITALS — BP 120/76 | HR 72 | Temp 98.2°F | Wt 252.5 lb

## 2016-04-20 DIAGNOSIS — Z23 Encounter for immunization: Secondary | ICD-10-CM | POA: Diagnosis not present

## 2016-04-20 DIAGNOSIS — F32A Depression, unspecified: Secondary | ICD-10-CM

## 2016-04-20 DIAGNOSIS — R635 Abnormal weight gain: Secondary | ICD-10-CM

## 2016-04-20 DIAGNOSIS — F419 Anxiety disorder, unspecified: Secondary | ICD-10-CM

## 2016-04-20 DIAGNOSIS — F329 Major depressive disorder, single episode, unspecified: Secondary | ICD-10-CM

## 2016-04-20 DIAGNOSIS — M545 Low back pain, unspecified: Secondary | ICD-10-CM

## 2016-04-20 DIAGNOSIS — F418 Other specified anxiety disorders: Secondary | ICD-10-CM

## 2016-04-20 MED ORDER — PREDNISONE 10 MG PO TABS: ORAL_TABLET | ORAL | 0 refills | Status: DC

## 2016-04-20 MED ORDER — NALTREXONE-BUPROPION HCL ER 8-90 MG PO TB12: ORAL_TABLET | ORAL | 0 refills | Status: DC

## 2016-04-20 NOTE — Progress Notes (Signed)
Subjective:    Patient ID: Linda [name], female    DOB: [birth year], 40 y.o.   MRN: [id number]  HPI  Pt presents to the clinic today with c/o right leg pain. She reports this started about 1 week ago. She describes the pain as sharp and stabbing. The pain radiats up to her back. The pain in her back is worse with bending or twisting. She denies numbness but does have tingling in her right leg. She denies loss of bowel or bladder. She has taken Ibuprofen with minimal relief. She denies any injury to her back but reports she has bulging disc in her back. She had a MRI 08/2015, which showed:   IMPRESSION: Minor lumbar disc pathology as described. Slight annular bulging at L4-5 in the foramen and beyond on the LEFT could irritate the LEFT L4 nerve root. Mild lower lumbar facet arthropathy without compressive lesion.  She also wants to discuss her anxiety and depression. This is triggered by a recent breakup with her fiance. She sometimes feels down and sad. She gets extremely anxious at times when she is around new people. She takes Xanax occasionally which helps her anxiety but not her depression. She was on Celexa in the past but stopped it because it made her gain weight. She has heard about Contrave for the treatment of depression and weight loss, and she would like to know if I thought this would be a good option for her. She has been being seen by a bariatric clinic and was supposed to have surgery, but her insurance will not cover it at this time because her BMI is < 40.  Review of Systems      Past Medical History:  Diagnosis Date  . Anxiety   . Asthma   . Bleeding    POST PARTUM BLEEDING  . Bronchitis with flu 06/12/11  . Heart murmur    while pregnant  . Migraine     Current Outpatient Prescriptions  Medication Sig Dispense Refill  . albuterol (PROVENTIL HFA;VENTOLIN HFA) 108 (90 Base) MCG/ACT inhaler Inhale 2 puffs into the lungs every 6 (six) hours as needed for wheezing  or shortness of breath. 1 Inhaler 2  . ALPRAZolam (XANAX) 0.25 MG tablet Take 1 tablet (0.25 mg total) by mouth 2 (two) times daily as needed. 20 tablet 0  . cyclobenzaprine (FLEXERIL) 5 MG tablet Take 1 tablet (5 mg total) by mouth 3 (three) times daily as needed for muscle spasms. 30 tablet 0  . methylPREDNISolone (MEDROL DOSEPAK) 4 MG TBPK tablet As directe 21 tablet 0  . pantoprazole (PROTONIX) 40 MG tablet TAKE 1 TABLET (40 MG TOTAL) BY MOUTH DAILY. 30 tablet 8  . zolmitriptan (ZOMIG) 5 MG tablet Take one tablet by mouth at on set of headache can take one more tablet 2 hours later if needed 10 tablet 11  . zonisamide (ZONEGRAN) 50 MG capsule Take 4 capsules (200 mg total) by mouth daily. 120 capsule 1  . Naltrexone-Bupropion HCl ER 8-90 MG TB12 Take 1 tab in the am x 1 week, then 1 tab in am and 1 tab in pm x 1 week, then 2 tabs in am and 1 tab in pm x 1 week, then 2 tabs in am and 2 tabs in pm thereafter 120 tablet 0  . predniSONE (DELTASONE) 10 MG tablet Take 3 tabs on days 1-3, take 2 tabs on days 4-6, take 1 tab on days 7-9 18 tablet 0   No current facility-administered  medications for this visit.     Allergies  Allergen Reactions  . Bacitra-Neomycin-Polymyxin-Hc Other (See Comments)    Pain and swelling of ear canal  . Hydrocodone-Acetaminophen Rash    Family History  Problem Relation Age of Onset  . Hypertension Father   . Diabetes Father   . Hypertension Mother   . Cancer Mother     uterine  . Stroke Mother   . Diabetes Maternal Aunt   . Diabetes Maternal Uncle   . Cancer Maternal Grandmother     PANCREATIC  . Hypertension Maternal Grandmother   . Diabetes Maternal Grandmother   . Cancer Maternal Grandfather     LUNG  . Hypertension Maternal Grandfather   . Diabetes Maternal Grandfather   . Cancer Paternal Grandmother     THROAT  . Alzheimer's disease Paternal Grandmother     Social History   Social History  . Marital status: Divorced    Spouse name: N/A  .  Number of children: N/A  . Years of education: N/A   Occupational History  . Not on file.   Social History Main Topics  . Smoking status: Never Smoker  . Smokeless tobacco: Never Used  . Alcohol use 0.6 oz/week    1 Standard drinks or equivalent per week     Comment: occasional (not much)  . Drug use: No  . Sexual activity: Not on file   Other Topics Concern  . Not on file   Social History Narrative  . No narrative on file     Constitutional: Denies fever, malaise, fatigue, headache or abrupt weight changes.  Gastrointestinal: Denies abdominal pain, bloating, constipation, diarrhea or blood in the stool.  GU: Denies urgency, frequency, pain with urination, burning sensation, blood in urine, odor or discharge. Musculoskeletal: Pt reports back and right leg pain. Denies difficulty with gait, muscle pain or joint swelling.  Neurological: Denies dizziness, difficulty with memory, difficulty with speech or problems with balance and coordination.  Psych: Pt reports anxiety and depression. Denies SI/HI.  No other specific complaints in a complete review of systems (except as listed in HPI above).  Objective:   Physical Exam   BP 120/76   Pulse 72   Temp 98.2 F (36.8 C) (Oral)   Wt 252 lb 8 oz (114.5 kg)   LMP 07/12/2011   SpO2 98%   BMI 38.96 kg/m  Wt Readings from Last 3 Encounters:  04/20/16 252 lb 8 oz (114.5 kg)  04/12/16 250 lb 6.4 oz (113.6 kg)  03/17/16 249 lb (112.9 kg)    General: Appears her stated age, obese in NAD. Neck:  Neck supple, trachea midline. No masses, lumps or thyromegaly present.  Cardiovascular: Normal rate and rhythm. S1,S2 noted.  No murmur, rubs or gallops noted.  Pulmonary/Chest: Normal effort and positive vesicular breath sounds. No respiratory distress. No wheezes, rales or ronchi noted.  Musculoskeletal: Normal flexion of the spine. Decreased extension and rotation secondary to pain. Pain with palpation over the lumbar spine. No pain  with palpation of the paralumbar muscles. Strength 5/5 BLE. No difficulty with gait.  Neurological: Negative SLR on the right. Sensation intact to BLE. Psychiatric: She is mildly anxious appearing today. Behavior is normal. Judgment and thought content normal.     BMET    Component Value Date/Time   NA 137 09/02/2015 1107   NA 137 05/31/2013 0846   K 3.9 09/02/2015 1107   K 3.8 05/31/2013 0846   CL 106 09/02/2015 1107   CL 106  05/31/2013 0846   CO2 25 09/02/2015 1107   CO2 26 05/31/2013 0846   GLUCOSE 77 09/02/2015 1107   GLUCOSE 93 05/31/2013 0846   BUN 13 09/02/2015 1107   BUN 10 05/31/2013 0846   CREATININE 0.87 09/02/2015 1107   CREATININE 0.76 05/31/2013 0846   CALCIUM 8.9 09/02/2015 1107   CALCIUM 8.2 (L) 05/31/2013 0846   GFRNONAA >60 03/13/2015 1353   GFRNONAA >60 05/31/2013 0846   GFRAA >60 03/13/2015 1353   GFRAA >60 05/31/2013 0846    Lipid Panel     Component Value Date/Time   CHOL 227 (H) 09/02/2015 1107   TRIG 245.0 (H) 09/02/2015 1107   HDL 51.10 09/02/2015 1107   CHOLHDL 4 09/02/2015 1107   VLDL 49.0 (H) 09/02/2015 1107    CBC    Component Value Date/Time   WBC 9.3 09/02/2015 1107   RBC 4.32 09/02/2015 1107   HGB 12.5 09/02/2015 1107   HGB 13.3 05/31/2013 0846   HCT 37.1 09/02/2015 1107   HCT 39.2 05/31/2013 0846   PLT 270.0 09/02/2015 1107   PLT 219 05/31/2013 0846   MCV 85.9 09/02/2015 1107   MCV 88 05/31/2013 0846   MCH 29.6 03/13/2015 1353   MCHC 33.6 09/02/2015 1107   RDW 13.8 09/02/2015 1107   RDW 12.8 05/31/2013 0846   LYMPHSABS 2.1 05/31/2013 0846   MONOABS 0.4 05/31/2013 0846   EOSABS 0.1 05/31/2013 0846   BASOSABS 0.1 05/31/2013 0846    Hgb A1C Lab Results  Component Value Date   HGBA1C 6.1 09/02/2015        Assessment & Plan:   Low back pain:  ? Lumbar strain Back exercises given eRx for Prednisone x 6 days Advised her not to take Ibuprofen with Prednisone She has Flexeril to take as needed If persist,  consider repeat MRI  Abnormal weight gain with anxiety and depression:  She has been working with the bariatric center and nutrition She does not qualify for surgery at this time Discussed high protein, low carb diet and importance of exercise RX for Contrave Continue Xanax as needed  RTC in 1 month for follow up for weight check/med refill Webb Silversmith, NP

## 2016-04-21 ENCOUNTER — Encounter: Payer: Self-pay | Admitting: Internal Medicine

## 2016-04-21 NOTE — Patient Instructions (Signed)

## 2016-05-06 ENCOUNTER — Telehealth: Payer: Self-pay | Admitting: Dietician

## 2016-05-06 NOTE — Telephone Encounter (Signed)
Gave Bailie Pre Op Diet with instructions to follow for one week before surgery. Also gave her vitamin handout.

## 2016-05-06 NOTE — Progress Notes (Signed)
Need orders in EPIC.  Surgery on 05/18/16.  preop on 05/14/16.  Thank You

## 2016-05-11 ENCOUNTER — Ambulatory Visit: Payer: Self-pay | Admitting: General Surgery

## 2016-05-12 ENCOUNTER — Ambulatory Visit: Payer: Federal, State, Local not specified - PPO | Admitting: Dietician

## 2016-05-13 ENCOUNTER — Ambulatory Visit: Payer: Federal, State, Local not specified - PPO | Admitting: Internal Medicine

## 2016-05-13 NOTE — Patient Instructions (Addendum)
Linda [name]  05/13/2016   Your procedure is scheduled on: 05/18/16  Report to [facility] Main  Entrance take [place]  elevators to 3rd floor to  [place] at  [phone].  Call this number if you have problems the morning of surgery [phone]   Remember: ONLY 1 PERSON MAY GO WITH YOU TO SHORT STAY TO GET  READY MORNING OF [place].  Do not eat food or drink liquids :After Midnight.     Take these medicines the morning of surgery with A SIP OF WATER: Alprazolam, , Protonix                  May take Flexaril, Zomig---may use albuterol if needed             You may not have any metal on your body including hair pins and              piercings  Do not wear jewelry, make-up, lotions, powders or perfumes, deodorant             Do not wear nail polish.  Do not shave  48 hours prior to surgery.              Men may shave face and neck.   Do not bring valuables to the hospital. [place].  Contacts, dentures or bridgework may not be worn into surgery.  Leave suitcase in the car. After surgery it may be brought to your room.                Please read over the following fact sheets you were given: _____________________________________________________________________             [facility] - Preparing for Surgery Before surgery, you can play an important role.  Because skin is not sterile, your skin needs to be as free of germs as possible.  You can reduce the number of germs on your skin by washing with CHG (chlorahexidine gluconate) soap before surgery.  CHG is an antiseptic cleaner which kills germs and bonds with the skin to continue killing germs even after washing. Please DO NOT use if you have an allergy to CHG or antibacterial soaps.  If your skin becomes reddened/irritated stop using the CHG and inform your nurse when you arrive at Short Stay. Do not shave (including legs and underarms) for at least  48 hours prior to the first CHG shower.  You may shave your face/neck. Please follow these instructions carefully:  1.  Shower with CHG Soap the night before surgery and the  morning of Surgery.  2.  If you choose to wash your hair, wash your hair first as usual with your  normal  shampoo.  3.  After you shampoo, rinse your hair and body thoroughly to remove the  shampoo.                           4.  Use CHG as you would any other liquid soap.  You can apply chg directly  to the skin and wash                       Gently with a scrungie or clean washcloth.  5.  Apply the CHG Soap to your body ONLY FROM THE NECK DOWN.   Do not use on face/ open                           Wound or open sores. Avoid contact with eyes, ears mouth and genitals (private parts).                       Wash face,  Genitals (private parts) with your normal soap.             6.  Wash thoroughly, paying special attention to the area where your surgery  will be performed.  7.  Thoroughly rinse your body with warm water from the neck down.  8.  DO NOT shower/wash with your normal soap after using and rinsing off  the CHG Soap.                9.  Pat yourself dry with a clean towel.            10.  Wear clean pajamas.            11.  Place clean sheets on your bed the night of your first shower and do not  sleep with pets. Day of Surgery : Do not apply any lotions/deodorants the morning of surgery.  Please wear clean clothes to the hospital/surgery center.  FAILURE TO FOLLOW THESE INSTRUCTIONS MAY RESULT IN THE CANCELLATION OF YOUR SURGERY PATIENT SIGNATURE_________________________________  NURSE SIGNATURE__________________________________  ________________________________________________________________________

## 2016-05-14 ENCOUNTER — Other Ambulatory Visit: Payer: Self-pay | Admitting: Internal Medicine

## 2016-05-14 ENCOUNTER — Encounter (HOSPITAL_COMMUNITY)
Admission: RE | Admit: 2016-05-14 | Discharge: 2016-05-14 | Disposition: A | Discharge status: Home / Self Care | Payer: Federal, State, Local not specified - PPO | Source: Ambulatory Visit | Attending: General Surgery | Admitting: General Surgery

## 2016-05-14 ENCOUNTER — Encounter (HOSPITAL_COMMUNITY): Payer: Self-pay

## 2016-05-14 DIAGNOSIS — Z01812 Encounter for preprocedural laboratory examination: Secondary | ICD-10-CM | POA: Insufficient documentation

## 2016-05-14 DIAGNOSIS — E669 Obesity, unspecified: Secondary | ICD-10-CM | POA: Insufficient documentation

## 2016-05-14 HISTORY — DX: Gastro-esophageal reflux disease without esophagitis: K21.9

## 2016-05-14 LAB — COMPREHENSIVE METABOLIC PANEL
ALBUMIN: 4.1 g/dL (ref 3.5–5.0)
ALK PHOS: 77 U/L (ref 38–126)
ALT: 15 U/L (ref 14–54)
ANION GAP: 6 (ref 5–15)
AST: 17 U/L (ref 15–41)
BUN: 12 mg/dL (ref 6–20)
CHLORIDE: 104 mmol/L (ref 101–111)
CO2: 29 mmol/L (ref 22–32)
Calcium: 9 mg/dL (ref 8.9–10.3)
Creatinine, Ser: 0.82 mg/dL (ref 0.44–1.00)
GFR calc non Af Amer: >60 mL/min (ref >60)
GLUCOSE: 91 mg/dL (ref 65–99)
POTASSIUM: 3.9 mmol/L (ref 3.5–5.1)
SODIUM: 139 mmol/L (ref 135–145)
Total Bilirubin: 0.7 mg/dL (ref 0.3–1.2)
Total Protein: 7.2 g/dL (ref 6.5–8.1)

## 2016-05-14 LAB — CBC WITH DIFFERENTIAL/PLATELET
BASOS PCT: 1 %
Basophils Absolute: 0 10*3/uL (ref 0.0–0.1)
EOS ABS: 0.1 10*3/uL (ref 0.0–0.7)
EOS PCT: 1 %
HCT: 37.3 % (ref 36.0–46.0)
HEMOGLOBIN: 12.3 g/dL (ref 12.0–15.0)
Lymphocytes Relative: 31 %
Lymphs Abs: 2.6 10*3/uL (ref 0.7–4.0)
MCH: 28.9 pg (ref 26.0–34.0)
MCHC: 33 g/dL (ref 30.0–36.0)
MCV: 87.8 fL (ref 78.0–100.0)
MONOS PCT: 6 %
Monocytes Absolute: 0.5 10*3/uL (ref 0.1–1.0)
NEUTROS PCT: 61 %
Neutro Abs: 5.3 10*3/uL (ref 1.7–7.7)
PLATELETS: 269 10*3/uL (ref 150–400)
RBC: 4.25 MIL/uL (ref 3.87–5.11)
RDW: 13.1 % (ref 11.5–15.5)
WBC: 8.5 10*3/uL (ref 4.0–10.5)

## 2016-05-14 LAB — SURGICAL PCR SCREEN
MRSA, PCR: POSITIVE — AB
STAPHYLOCOCCUS AUREUS: POSITIVE — AB

## 2016-05-14 MED ORDER — ALPRAZOLAM 0.25 MG PO TABS: 0.2500 mg | ORAL_TABLET | Freq: Two times a day (BID) | ORAL | 0 refills | Status: DC | PRN

## 2016-05-14 NOTE — Telephone Encounter (Signed)
Rx called in to pharmacy. 

## 2016-05-14 NOTE — Telephone Encounter (Signed)
Ok to phone in Xanax 

## 2016-05-14 NOTE — Telephone Encounter (Signed)
Last filled 03/16/16--please advise

## 2016-05-17 ENCOUNTER — Encounter: Payer: Federal, State, Local not specified - PPO | Attending: General Surgery | Admitting: Dietician

## 2016-05-17 ENCOUNTER — Encounter: Payer: Self-pay | Admitting: Dietician

## 2016-05-17 DIAGNOSIS — Z6838 Body mass index (BMI) 38.0-38.9, adult: Secondary | ICD-10-CM | POA: Insufficient documentation

## 2016-05-17 DIAGNOSIS — Z713 Dietary counseling and surveillance: Secondary | ICD-10-CM | POA: Diagnosis not present

## 2016-05-17 DIAGNOSIS — Z79899 Other long term (current) drug therapy: Secondary | ICD-10-CM | POA: Insufficient documentation

## 2016-05-17 DIAGNOSIS — E669 Obesity, unspecified: Secondary | ICD-10-CM

## 2016-05-17 NOTE — Progress Notes (Signed)
  Pre-Operative Nutrition Class:  Appt start time: 830   End time:  930.  Patient was seen on 05/17/2016 for Pre-Operative Bariatric Surgery Education [facility].   Surgery date: 05/18/2016 Surgery type: RYGB Start weight at [facility]: 249 lbs on 02/18/2016 Weight today: 255.4 lbs  TANITA  BODY COMP RESULTS  05/17/16   BMI (kg/m^2) 39.4   Fat Mass (lbs) 129.2   Fat Free Mass (lbs) 126.2   Total Body Water (lbs) 93.2   Samples given per MNT protocol. Patient educated on appropriate usage: Premier protein shake (chocolate - qty 1) Lot #: [id number] Exp: 03/2017  The following the learning objectives were met by the patient during this course:  Identify Pre-Op Dietary Goals and will begin 2 weeks pre-operatively  Identify appropriate sources of fluids and proteins   State protein recommendations and appropriate sources pre and post-operatively  Identify Post-Operative Dietary Goals and will follow for 2 weeks post-operatively  Identify appropriate multivitamin and calcium sources  Describe the need for physical activity post-operatively and will follow MD recommendations  State when to call healthcare provider regarding medication questions or post-operative complications  Handouts given during class include:  Pre-Op Bariatric Surgery Diet Handout  Protein Shake Handout  Post-Op Bariatric Surgery Nutrition Handout  BELT Program Information Flyer  Support Group Information Flyer  [facility] Bariatric Supplements Price List  Follow-Up Plan: Patient will follow-up at [facility] 2 weeks post operatively for diet advancement per MD.

## 2016-05-18 ENCOUNTER — Inpatient Hospital Stay (HOSPITAL_COMMUNITY): Payer: Federal, State, Local not specified - PPO | Admitting: Certified Registered Nurse Anesthetist

## 2016-05-18 ENCOUNTER — Encounter (HOSPITAL_COMMUNITY)
Admission: RE | Disposition: A | Discharge status: Home / Self Care | Payer: Self-pay | Source: Ambulatory Visit | Attending: General Surgery

## 2016-05-18 ENCOUNTER — Inpatient Hospital Stay (HOSPITAL_COMMUNITY)
Admission: RE | Admit: 2016-05-18 | Discharge: 2016-05-20 | DRG: 621 | Disposition: A | Discharge status: Home / Self Care | Payer: Federal, State, Local not specified - PPO | Source: Ambulatory Visit | Attending: General Surgery | Admitting: General Surgery

## 2016-05-18 ENCOUNTER — Encounter (HOSPITAL_COMMUNITY): Payer: Self-pay | Admitting: Anesthesiology

## 2016-05-18 DIAGNOSIS — Z888 Allergy status to other drugs, medicaments and biological substances status: Secondary | ICD-10-CM | POA: Diagnosis not present

## 2016-05-18 DIAGNOSIS — Z820 Family history of epilepsy and other diseases of the nervous system: Secondary | ICD-10-CM

## 2016-05-18 DIAGNOSIS — K219 Gastro-esophageal reflux disease without esophagitis: Secondary | ICD-10-CM | POA: Diagnosis not present

## 2016-05-18 DIAGNOSIS — J45909 Unspecified asthma, uncomplicated: Secondary | ICD-10-CM | POA: Diagnosis present

## 2016-05-18 DIAGNOSIS — G43909 Migraine, unspecified, not intractable, without status migrainosus: Secondary | ICD-10-CM | POA: Diagnosis present

## 2016-05-18 DIAGNOSIS — Z79899 Other long term (current) drug therapy: Secondary | ICD-10-CM

## 2016-05-18 DIAGNOSIS — F419 Anxiety disorder, unspecified: Secondary | ICD-10-CM | POA: Diagnosis not present

## 2016-05-18 DIAGNOSIS — Z823 Family history of stroke: Secondary | ICD-10-CM | POA: Diagnosis not present

## 2016-05-18 DIAGNOSIS — Z6841 Body Mass Index (BMI) 40.0 and over, adult: Secondary | ICD-10-CM

## 2016-05-18 DIAGNOSIS — Z833 Family history of diabetes mellitus: Secondary | ICD-10-CM | POA: Diagnosis not present

## 2016-05-18 DIAGNOSIS — E669 Obesity, unspecified: Secondary | ICD-10-CM | POA: Diagnosis present

## 2016-05-18 DIAGNOSIS — Z90710 Acquired absence of both cervix and uterus: Secondary | ICD-10-CM

## 2016-05-18 DIAGNOSIS — Z885 Allergy status to narcotic agent status: Secondary | ICD-10-CM

## 2016-05-18 DIAGNOSIS — Z8249 Family history of ischemic heart disease and other diseases of the circulatory system: Secondary | ICD-10-CM

## 2016-05-18 HISTORY — PX: GASTRIC ROUX-EN-Y: SHX5262

## 2016-05-18 HISTORY — PX: UPPER GI ENDOSCOPY: SHX6162

## 2016-05-18 LAB — CBC
HCT: 38.2 % (ref 36.0–46.0)
Hemoglobin: 12.6 g/dL (ref 12.0–15.0)
MCH: 29.2 pg (ref 26.0–34.0)
MCHC: 33 g/dL (ref 30.0–36.0)
MCV: 88.4 fL (ref 78.0–100.0)
PLATELETS: 217 10*3/uL (ref 150–400)
RBC: 4.32 MIL/uL (ref 3.87–5.11)
RDW: 13 % (ref 11.5–15.5)
WBC: 9.5 10*3/uL (ref 4.0–10.5)

## 2016-05-18 LAB — CREATININE, SERUM: CREATININE: 0.87 mg/dL (ref 0.44–1.00)

## 2016-05-18 SURGERY — LAPAROSCOPIC ROUX-EN-Y GASTRIC
Anesthesia: General

## 2016-05-18 MED ORDER — HYDROMORPHONE HCL 1 MG/ML IJ SOLN
INTRAMUSCULAR | Status: AC
  Filled 2016-05-18: qty 1

## 2016-05-18 MED ORDER — ACETAMINOPHEN 160 MG/5ML PO SOLN: 325.0000 mg | ORAL | Status: DC | PRN

## 2016-05-18 MED ORDER — SUGAMMADEX SODIUM 200 MG/2ML IV SOLN
INTRAVENOUS | Status: DC | PRN
  Administered 2016-05-18: 300 mg via INTRAVENOUS

## 2016-05-18 MED ORDER — VANCOMYCIN HCL IN DEXTROSE 1-5 GM/200ML-% IV SOLN
INTRAVENOUS | Status: AC
  Filled 2016-05-18: qty 200

## 2016-05-18 MED ORDER — CEFOTETAN DISODIUM-DEXTROSE 2-2.08 GM-% IV SOLR: 2.0000 g | INTRAVENOUS | Status: DC

## 2016-05-18 MED ORDER — SODIUM CHLORIDE 0.9 % IJ SOLN
INTRAMUSCULAR | Status: AC
  Filled 2016-05-18: qty 50

## 2016-05-18 MED ORDER — MORPHINE SULFATE (PF) 2 MG/ML IV SOLN
2.0000 mg | INTRAVENOUS | Status: DC | PRN
  Administered 2016-05-18 (×3): 2 mg via INTRAVENOUS
  Filled 2016-05-18 (×3): qty 1

## 2016-05-18 MED ORDER — HYDROMORPHONE HCL 1 MG/ML IJ SOLN
0.2500 mg | INTRAMUSCULAR | Status: DC | PRN
  Administered 2016-05-18 (×4): 0.5 mg via INTRAVENOUS

## 2016-05-18 MED ORDER — ONDANSETRON HCL 4 MG/2ML IJ SOLN
4.0000 mg | INTRAMUSCULAR | Status: DC | PRN
  Administered 2016-05-19 (×3): 4 mg via INTRAVENOUS
  Filled 2016-05-18 (×3): qty 2

## 2016-05-18 MED ORDER — ACETAMINOPHEN 10 MG/ML IV SOLN
INTRAVENOUS | Status: AC
  Filled 2016-05-18: qty 100

## 2016-05-18 MED ORDER — ONDANSETRON HCL 4 MG/2ML IJ SOLN
INTRAMUSCULAR | Status: DC | PRN
  Administered 2016-05-18: 4 mg via INTRAVENOUS

## 2016-05-18 MED ORDER — PROPOFOL 10 MG/ML IV BOLUS
INTRAVENOUS | Status: DC | PRN
  Administered 2016-05-18: 200 mg via INTRAVENOUS

## 2016-05-18 MED ORDER — FENTANYL CITRATE (PF) 250 MCG/5ML IJ SOLN
INTRAMUSCULAR | Status: AC
  Filled 2016-05-18: qty 5

## 2016-05-18 MED ORDER — SODIUM CHLORIDE 0.9 % IJ SOLN
INTRAMUSCULAR | Status: DC | PRN
  Administered 2016-05-18: 50 mL via INTRAVENOUS

## 2016-05-18 MED ORDER — SCOPOLAMINE 1 MG/3DAYS TD PT72
1.0000 | MEDICATED_PATCH | TRANSDERMAL | Status: DC
  Administered 2016-05-18: 1.5 mg via TRANSDERMAL

## 2016-05-18 MED ORDER — EVICEL 5 ML EX KIT
PACK | Freq: Once | CUTANEOUS | Status: DC
  Filled 2016-05-18: qty 1

## 2016-05-18 MED ORDER — VANCOMYCIN HCL IN DEXTROSE 1-5 GM/200ML-% IV SOLN
1000.0000 mg | Freq: Once | INTRAVENOUS | Status: AC
  Administered 2016-05-18: 1000 mg via INTRAVENOUS

## 2016-05-18 MED ORDER — ROCURONIUM BROMIDE 50 MG/5ML IV SOSY
PREFILLED_SYRINGE | INTRAVENOUS | Status: AC
  Filled 2016-05-18: qty 5

## 2016-05-18 MED ORDER — FAMOTIDINE IN NACL 20-0.9 MG/50ML-% IV SOLN
20.0000 mg | Freq: Two times a day (BID) | INTRAVENOUS | Status: DC
  Administered 2016-05-18 – 2016-05-19 (×3): 20 mg via INTRAVENOUS
  Filled 2016-05-18 (×3): qty 50

## 2016-05-18 MED ORDER — PROPOFOL 10 MG/ML IV BOLUS
INTRAVENOUS | Status: AC
  Filled 2016-05-18: qty 20

## 2016-05-18 MED ORDER — MIDAZOLAM HCL 2 MG/2ML IJ SOLN
INTRAMUSCULAR | Status: AC
  Filled 2016-05-18: qty 2

## 2016-05-18 MED ORDER — DEXTROSE-NACL 5-0.45 % IV SOLN
INTRAVENOUS | Status: DC
  Administered 2016-05-18 – 2016-05-20 (×4): via INTRAVENOUS

## 2016-05-18 MED ORDER — ACETAMINOPHEN 160 MG/5ML PO SOLN
650.0000 mg | ORAL | Status: DC | PRN
  Administered 2016-05-19: 650 mg via ORAL
  Filled 2016-05-18: qty 20.3

## 2016-05-18 MED ORDER — SUGAMMADEX SODIUM 500 MG/5ML IV SOLN
INTRAVENOUS | Status: AC
  Filled 2016-05-18: qty 5

## 2016-05-18 MED ORDER — MEPERIDINE HCL 50 MG/ML IJ SOLN: 6.2500 mg | INTRAMUSCULAR | Status: DC | PRN

## 2016-05-18 MED ORDER — ONDANSETRON HCL 4 MG/2ML IJ SOLN
INTRAMUSCULAR | Status: AC
  Filled 2016-05-18: qty 2

## 2016-05-18 MED ORDER — HEPARIN SODIUM (PORCINE) 5000 UNIT/ML IJ SOLN
5000.0000 [IU] | Freq: Three times a day (TID) | INTRAMUSCULAR | Status: DC
  Administered 2016-05-18 – 2016-05-20 (×5): 5000 [IU] via SUBCUTANEOUS
  Filled 2016-05-18 (×4): qty 1

## 2016-05-18 MED ORDER — LACTATED RINGERS IV SOLN: INTRAVENOUS | Status: DC

## 2016-05-18 MED ORDER — BUPIVACAINE LIPOSOME 1.3 % IJ SUSP
20.0000 mL | Freq: Once | INTRAMUSCULAR | Status: AC
  Administered 2016-05-18: 20 mL
  Filled 2016-05-18: qty 20

## 2016-05-18 MED ORDER — OXYCODONE HCL 5 MG/5ML PO SOLN
5.0000 mg | ORAL | Status: DC | PRN
  Administered 2016-05-19 – 2016-05-20 (×3): 10 mg via ORAL
  Filled 2016-05-18 (×3): qty 10

## 2016-05-18 MED ORDER — LACTATED RINGERS IR SOLN
Status: DC | PRN
  Administered 2016-05-18: 3000 mL

## 2016-05-18 MED ORDER — PREMIER PROTEIN SHAKE
2.0000 [oz_av] | ORAL | Status: DC
  Administered 2016-05-20: 2 [oz_av] via ORAL
  Filled 2016-05-18: qty 325.31

## 2016-05-18 MED ORDER — LACTATED RINGERS IV SOLN
INTRAVENOUS | Status: DC | PRN
  Administered 2016-05-18 (×2): via INTRAVENOUS

## 2016-05-18 MED ORDER — CHLORHEXIDINE GLUCONATE 4 % EX LIQD: 60.0000 mL | Freq: Once | CUTANEOUS | Status: DC

## 2016-05-18 MED ORDER — LIDOCAINE 2% (20 MG/ML) 5 ML SYRINGE
INTRAMUSCULAR | Status: AC
  Filled 2016-05-18: qty 5

## 2016-05-18 MED ORDER — MIDAZOLAM HCL 5 MG/5ML IJ SOLN
INTRAMUSCULAR | Status: DC | PRN
  Administered 2016-05-18: 2 mg via INTRAVENOUS

## 2016-05-18 MED ORDER — SUCCINYLCHOLINE CHLORIDE 20 MG/ML IJ SOLN
INTRAMUSCULAR | Status: DC | PRN
  Administered 2016-05-18: 100 mg via INTRAVENOUS

## 2016-05-18 MED ORDER — 0.9 % SODIUM CHLORIDE (POUR BTL) OPTIME
TOPICAL | Status: DC | PRN
  Administered 2016-05-18: 1000 mL

## 2016-05-18 MED ORDER — ACETAMINOPHEN 10 MG/ML IV SOLN
1000.0000 mg | Freq: Four times a day (QID) | INTRAVENOUS | Status: AC
  Administered 2016-05-18 – 2016-05-19 (×5): 1000 mg via INTRAVENOUS
  Filled 2016-05-18 (×5): qty 100

## 2016-05-18 MED ORDER — BUPIVACAINE HCL (PF) 0.25 % IJ SOLN
INTRAMUSCULAR | Status: AC
  Filled 2016-05-18: qty 30

## 2016-05-18 MED ORDER — HEPARIN SODIUM (PORCINE) 5000 UNIT/ML IJ SOLN
5000.0000 [IU] | INTRAMUSCULAR | Status: AC
  Administered 2016-05-18: 5000 [IU] via SUBCUTANEOUS
  Filled 2016-05-18: qty 1

## 2016-05-18 MED ORDER — LIDOCAINE 2% (20 MG/ML) 5 ML SYRINGE
INTRAMUSCULAR | Status: DC | PRN
  Administered 2016-05-18: 80 mg via INTRAVENOUS

## 2016-05-18 MED ORDER — METOCLOPRAMIDE HCL 5 MG/ML IJ SOLN: 10.0000 mg | Freq: Once | INTRAMUSCULAR | Status: DC | PRN

## 2016-05-18 MED ORDER — FENTANYL CITRATE (PF) 100 MCG/2ML IJ SOLN
INTRAMUSCULAR | Status: DC | PRN
  Administered 2016-05-18: 100 ug via INTRAVENOUS
  Administered 2016-05-18 (×2): 50 ug via INTRAVENOUS

## 2016-05-18 MED ORDER — ROCURONIUM BROMIDE 10 MG/ML (PF) SYRINGE
PREFILLED_SYRINGE | INTRAVENOUS | Status: DC | PRN
  Administered 2016-05-18: 20 mg via INTRAVENOUS
  Administered 2016-05-18: 30 mg via INTRAVENOUS
  Administered 2016-05-18: 40 mg via INTRAVENOUS
  Administered 2016-05-18: 10 mg via INTRAVENOUS

## 2016-05-18 MED ORDER — SCOPOLAMINE 1 MG/3DAYS TD PT72
MEDICATED_PATCH | TRANSDERMAL | Status: AC
  Filled 2016-05-18: qty 1

## 2016-05-18 SURGICAL SUPPLY — 72 items
ADH SKN CLS APL DERMABOND .7 (GAUZE/BANDAGES/DRESSINGS) ×2
APPLIER CLIP 5 13 M/L LIGAMAX5 (MISCELLANEOUS)
APPLIER CLIP ROT 10 11.4 M/L (STAPLE)
APPLIER CLIP ROT 13.4 12 LRG (CLIP)
APR CLP LRG 13.4X12 ROT 20 MLT (CLIP)
APR CLP MED LRG 11.4X10 (STAPLE)
APR CLP MED LRG 5 ANG JAW (MISCELLANEOUS)
BLADE SURG SZ11 CARB STEEL (BLADE) ×3 IMPLANT
CABLE HIGH FREQUENCY MONO STRZ (ELECTRODE) IMPLANT
CATH ROBINSON RED A/P 12FR (CATHETERS) IMPLANT
CHLORAPREP W/TINT 26ML (MISCELLANEOUS) ×4 IMPLANT
CLIP APPLIE 5 13 M/L LIGAMAX5 (MISCELLANEOUS) IMPLANT
CLIP APPLIE ROT 10 11.4 M/L (STAPLE) IMPLANT
CLIP APPLIE ROT 13.4 12 LRG (CLIP) IMPLANT
CLIP SUT LAPRA TY ABSORB (SUTURE) ×1 IMPLANT
COVER SURGICAL LIGHT HANDLE (MISCELLANEOUS) IMPLANT
DERMABOND ADVANCED (GAUZE/BANDAGES/DRESSINGS) ×1
DERMABOND ADVANCED .7 DNX12 (GAUZE/BANDAGES/DRESSINGS) ×2 IMPLANT
DEVICE SUTURE ENDOST 10MM (ENDOMECHANICALS) ×3 IMPLANT
DRAIN CHANNEL 19F RND (DRAIN) ×2 IMPLANT
DRAIN PENROSE 18X1/4 LTX STRL (WOUND CARE) ×3 IMPLANT
ELECT L-HOOK LAP 45CM DISP (ELECTROSURGICAL) ×3
ELECT PENCIL ROCKER SW 15FT (MISCELLANEOUS) ×3 IMPLANT
ELECTRODE L-HOOK LAP 45CM DISP (ELECTROSURGICAL) ×2 IMPLANT
EVACUATOR SILICONE 100CC (DRAIN) ×2 IMPLANT
GAUZE SPONGE 4X4 12PLY STRL (GAUZE/BANDAGES/DRESSINGS) IMPLANT
GAUZE SPONGE 4X4 16PLY XRAY LF (GAUZE/BANDAGES/DRESSINGS) ×3 IMPLANT
GLOVE BIO SURGEON STRL SZ7 (GLOVE) ×3 IMPLANT
GLOVE BIOGEL PI IND STRL 7.0 (GLOVE) ×2 IMPLANT
GLOVE BIOGEL PI INDICATOR 7.0 (GLOVE) ×1
GOWN STRL REUS W/TWL XL LVL3 (GOWN DISPOSABLE) ×12 IMPLANT
HANDLE STAPLE EGIA 4 XL (STAPLE) ×3 IMPLANT
HOLDER FOLEY CATH W/STRAP (MISCELLANEOUS) ×1 IMPLANT
HOVERMATT SINGLE USE (MISCELLANEOUS) ×3 IMPLANT
IRRIG SUCT STRYKERFLOW 2 WTIP (MISCELLANEOUS) ×3
IRRIGATION SUCT STRKRFLW 2 WTP (MISCELLANEOUS) ×2 IMPLANT
KIT BASIN OR (CUSTOM PROCEDURE TRAY) ×3 IMPLANT
KIT GASTRIC LAVAGE 34FR ADT (SET/KITS/TRAYS/PACK) IMPLANT
MARKER SKIN DUAL TIP RULER LAB (MISCELLANEOUS) ×3 IMPLANT
NDL SPNL 22GX3.5 QUINCKE BK (NEEDLE) ×2 IMPLANT
NEEDLE SPNL 22GX3.5 QUINCKE BK (NEEDLE) ×3 IMPLANT
PACK CARDIOVASCULAR III (CUSTOM PROCEDURE TRAY) ×3 IMPLANT
RELOAD EGIA 45 MED/THCK PURPLE (STAPLE) ×1 IMPLANT
RELOAD EGIA 60 MED/THCK PURPLE (STAPLE) ×12 IMPLANT
RELOAD EGIA 60 TAN VASC (STAPLE) ×3 IMPLANT
RELOAD ENDO STITCH 2.0 (ENDOMECHANICALS) ×33
RELOAD STAPLE 60 MED/THCK ART (STAPLE) IMPLANT
RELOAD SUT SNGL STCH ABSRB 2-0 (ENDOMECHANICALS) ×8 IMPLANT
RELOAD SUT SNGL STCH BLK 2-0 (ENDOMECHANICALS) ×6 IMPLANT
SCISSORS LAP 5X45 EPIX DISP (ENDOMECHANICALS) ×3 IMPLANT
SHEARS HARMONIC ACE PLUS 45CM (MISCELLANEOUS) ×3 IMPLANT
SLEEVE XCEL OPT CAN 5 100 (ENDOMECHANICALS) ×7 IMPLANT
SOLUTION ANTI FOG 6CC (MISCELLANEOUS) ×3 IMPLANT
STAPLER VISISTAT 35W (STAPLE) IMPLANT
SUT ETHILON 2 0 PS N (SUTURE) ×2 IMPLANT
SUT MNCRL AB 4-0 PS2 18 (SUTURE) ×4 IMPLANT
SUT RELOAD ENDO STITCH 2 48X1 (ENDOMECHANICALS) ×12
SUT RELOAD ENDO STITCH 2.0 (ENDOMECHANICALS) ×10
SUT SILK 0 SH 30 (SUTURE) IMPLANT
SUTURE RELOAD END STTCH 2 48X1 (ENDOMECHANICALS) ×12 IMPLANT
SUTURE RELOAD ENDO STITCH 2.0 (ENDOMECHANICALS) ×10 IMPLANT
SYR 20CC LL (SYRINGE) ×3 IMPLANT
SYR 50ML LL SCALE MARK (SYRINGE) ×3 IMPLANT
TOWEL OR 17X26 10 PK STRL BLUE (TOWEL DISPOSABLE) ×3 IMPLANT
TOWEL OR NON WOVEN STRL DISP B (DISPOSABLE) ×3 IMPLANT
TRAY FOLEY CATH 14FRSI W/METER (CATHETERS) ×1 IMPLANT
TRAY FOLEY W/METER SILVER 16FR (SET/KITS/TRAYS/PACK) ×2 IMPLANT
TROCAR BLADELESS OPT 5 100 (ENDOMECHANICALS) ×3 IMPLANT
TROCAR XCEL 12X100 BLDLESS (ENDOMECHANICALS) ×3 IMPLANT
TUBING CONNECTING 10 (TUBING) ×3 IMPLANT
TUBING ENDO SMARTCAP PENTAX (MISCELLANEOUS) ×3 IMPLANT
TUBING INSUF HEATED (TUBING) ×3 IMPLANT

## 2016-05-18 NOTE — H&P (Signed)
Linda [name] is an 40 y.o. female.   Chief Complaint: obesity HPI: 40 yo female with class III obesity and reflux. She has completed all necessary requirements and is ready to proceed with gastric bypass. She reports today she had her normal migraine symptoms lastnight and some this morning.  Past Medical History:  Diagnosis Date  . Anginal pain ([place])   . Anxiety   . Asthma   . Bleeding    POST PARTUM BLEEDING  . Bronchitis with flu 06/12/11  . GERD (gastroesophageal reflux disease)   . Heart murmur    while pregnant  . Migraine   . PONV (postoperative nausea and vomiting)     Past Surgical History:  Procedure Laterality Date  . ABDOMINAL HYSTERECTOMY  12/20012   Total  . ENDOMETRIAL ABLATION  2012  . MYRINGOTOMY WITH TUBE PLACEMENT     X 3 SINCE ORRIGINALLY PLACE IN 2010  . TONSILECTOMY, ADENOIDECTOMY, BILATERAL MYRINGOTOMY AND TUBES  2010  . TONSILLECTOMY    . TUBAL LIGATION  08/2010    Family History  Problem Relation Age of Onset  . Hypertension Father   . Diabetes Father   . Hypertension Mother   . Cancer Mother     uterine  . Stroke Mother   . Diabetes Maternal Aunt   . Diabetes Maternal Uncle   . Cancer Maternal Grandmother     PANCREATIC  . Hypertension Maternal Grandmother   . Diabetes Maternal Grandmother   . Cancer Maternal Grandfather     LUNG  . Hypertension Maternal Grandfather   . Diabetes Maternal Grandfather   . Cancer Paternal Grandmother     THROAT  . Alzheimer's disease Paternal Grandmother    Social History:  reports that she has never smoked. She has never used smokeless tobacco. She reports that she drinks about 0.6 oz of alcohol per week . She reports that she does not use drugs.  Allergies:  Allergies  Allergen Reactions  . Bacitra-Neomycin-Polymyxin-Hc Other (See Comments)    Pain and swelling of ear canal  . Hydrocodone-Acetaminophen Rash    Medications Prior to Admission  Medication Sig Dispense Refill  . ALPRAZolam  (XANAX) 0.25 MG tablet Take 1 tablet (0.25 mg total) by mouth 2 (two) times daily as needed. (Patient taking differently: Take 0.25 mg by mouth 2 (two) times daily as needed for anxiety. ) 20 tablet 0  . naproxen sodium (ANAPROX) 220 MG tablet Take 440 mg by mouth 2 (two) times daily as needed (for pain).    Marland Kitchen OVER THE COUNTER MEDICATION Take 1 tablet by mouth daily. [name], Vitamin D [id number] IU*    . pantoprazole (PROTONIX) 40 MG tablet TAKE 1 TABLET (40 MG TOTAL) BY MOUTH DAILY. (Patient taking differently: TAKE 1 TABLET (40 MG TOTAL) BY MOUTH twice per day) 30 tablet 8  . zolmitriptan (ZOMIG) 5 MG tablet Take one tablet by mouth at on set of headache can take one more tablet 2 hours later if needed 10 tablet 11  . zonisamide (ZONEGRAN) 50 MG capsule Take 4 capsules (200 mg total) by mouth daily. 120 capsule 1  . albuterol (PROVENTIL HFA;VENTOLIN HFA) 108 (90 Base) MCG/ACT inhaler Inhale 2 puffs into the lungs every 6 (six) hours as needed for wheezing or shortness of breath. 1 Inhaler 2  . Naltrexone-Bupropion HCl ER 8-90 MG TB12 Take 1 tab in the am x 1 week, then 1 tab in am and 1 tab in pm x 1 week, then 2 tabs in  am and 1 tab in pm x 1 week, then 2 tabs in am and 2 tabs in pm thereafter (Patient not taking: Reported on 05/14/2016) 120 tablet 0  . predniSONE (DELTASONE) 10 MG tablet Take 3 tabs on days 1-3, take 2 tabs on days 4-6, take 1 tab on days 7-9 (Patient not taking: Reported on 05/14/2016) 18 tablet 0    No results found for this or any previous visit (from the past 48 hour(s)). No results found.  Review of Systems  Constitutional: Negative for chills and fever.  HENT: Negative for hearing loss.   Eyes: Negative for blurred vision and double vision.  Respiratory: Negative for cough and hemoptysis.   Cardiovascular: Negative for chest pain and palpitations.  Gastrointestinal: Negative for abdominal pain, nausea and vomiting.  Genitourinary: Negative for dysuria and urgency.   Musculoskeletal: Negative for myalgias and neck pain.  Skin: Negative for itching and rash.  Neurological: Negative for dizziness, tingling and headaches.  Endo/Heme/Allergies: Does not bruise/bleed easily.  Psychiatric/Behavioral: Negative for depression and suicidal ideas.    Blood pressure 139/76, pulse 88, temperature 98.3 F (36.8 C), temperature source Oral, resp. rate 16, height 5' 0.75" (1.543 m), weight 114.8 kg (253 lb), last menstrual period 07/12/2011, SpO2 99 %. Physical Exam  Vitals reviewed. Constitutional: She is oriented to person, place, and time. She appears well-developed and well-nourished.  HENT:  Head: Normocephalic and atraumatic.  Eyes: Conjunctivae and EOM are normal. Pupils are equal, round, and reactive to light.  Neck: Normal range of motion. Neck supple.  Cardiovascular: Normal rate and regular rhythm.   Respiratory: Effort normal and breath sounds normal.  GI: Soft. Bowel sounds are normal. She exhibits no distension. There is no tenderness.  Musculoskeletal: Normal range of motion.  Neurological: She is alert and oriented to person, place, and time.  Skin: Skin is warm and dry.  Psychiatric: She has a normal mood and affect. Her behavior is normal.     Assessment/Plan 40 yo female with morbid obesity -lap RnY gastric bypass  [name], MD 05/18/2016, 10:09 AM

## 2016-05-18 NOTE — Transfer of Care (Signed)
Immediate Anesthesia Transfer of Care Note  Patient: Linda [name]  Procedure(s) Performed: Procedure(s): LAPAROSCOPIC ROUX-EN-Y GASTRIC WITH UPPER ENDO (N/A) UPPER GI ENDOSCOPY  Patient Location: PACU  Anesthesia Type:General  Level of Consciousness: sedated, unresponsive and responds to stimulation  Airway & Oxygen Therapy: Patient Spontanous Breathing and Patient connected to face mask oxygen  Post-op Assessment: Report given to RN and Post -op Vital signs reviewed and stable  Post vital signs: Reviewed and stable  Last Vitals:  Vitals:   05/18/16 0912  BP: 139/76  Pulse: 88  Resp: 16  Temp: 36.8 C    Last Pain:  Vitals:   05/18/16 0912  TempSrc: Oral      Patients Stated Pain Goal: 4 ([id number])  Complications: No apparent anesthesia complications

## 2016-05-18 NOTE — Anesthesia Preprocedure Evaluation (Addendum)
Anesthesia Evaluation  Patient identified by MRN, date of birth, ID band Patient awake    Reviewed: Allergy & Precautions, NPO status , Patient's Chart, lab work & pertinent test results  History of Anesthesia Complications (+) PONV and history of anesthetic complications  Airway Mallampati: III  TM Distance: >3 FB Neck ROM: Full    Dental no notable dental hx. (+) Teeth Intact   Pulmonary asthma ,    Pulmonary exam normal breath sounds clear to auscultation       Cardiovascular + angina Normal cardiovascular exam+ Valvular Problems/Murmurs  Rhythm:Regular Rate:Normal     Neuro/Psych  Headaches, PSYCHIATRIC DISORDERS Anxiety    GI/Hepatic Neg liver ROS, GERD  Medicated and Controlled,  Endo/Other  Morbid obesity  Renal/GU negative Renal ROS  negative genitourinary   Musculoskeletal negative musculoskeletal ROS (+)   Abdominal (+) + obese,   Peds  Hematology negative hematology ROS (+)   Anesthesia Other Findings   Reproductive/Obstetrics negative OB ROS                             Lab Results  Component Value Date   WBC 8.5 05/14/2016   HGB 12.3 05/14/2016   HCT 37.3 05/14/2016   MCV 87.8 05/14/2016   PLT 269 05/14/2016     Chemistry      Component Value Date/Time   NA 139 05/14/2016 1218   NA 137 05/31/2013 0846   K 3.9 05/14/2016 1218   K 3.8 05/31/2013 0846   CL 104 05/14/2016 1218   CL 106 05/31/2013 0846   CO2 29 05/14/2016 1218   CO2 26 05/31/2013 0846   BUN 12 05/14/2016 1218   BUN 10 05/31/2013 0846   CREATININE 0.82 05/14/2016 1218   CREATININE 0.76 05/31/2013 0846      Component Value Date/Time   CALCIUM 9.0 05/14/2016 1218   CALCIUM 8.2 (L) 05/31/2013 0846   ALKPHOS 77 05/14/2016 1218   AST 17 05/14/2016 1218   ALT 15 05/14/2016 1218   BILITOT 0.7 05/14/2016 1218     EKG: normal EKG, normal sinus rhythm.  Anesthesia Physical Anesthesia Plan  ASA:  III  Anesthesia Plan: General   Post-op Pain Management:    Induction: Intravenous, Rapid sequence and Cricoid pressure planned  Airway Management Planned: Oral ETT  Additional Equipment:   Intra-op Plan:   Post-operative Plan: Extubation in OR  Informed Consent: I have reviewed the patients History and Physical, chart, labs and discussed the procedure including the risks, benefits and alternatives for the proposed anesthesia with the patient or authorized representative who has indicated his/her understanding and acceptance.   Dental advisory given  Plan Discussed with: CRNA, Anesthesiologist and Surgeon  Anesthesia Plan Comments:         Anesthesia Quick Evaluation

## 2016-05-18 NOTE — Op Note (Signed)
[name] [name] 1975-08-23 05/18/2016  Preoperative diagnosis: morbid obesity  Postoperative diagnosis: Same   Procedure: Upper endoscopy   Surgeon: Gayland Curry M.D., FACS   Anesthesia: Gen.   Indications for procedure: 40 y.o. yo female undergoing a laparoscopic roux en y gastric bypass and an upper endoscopy was requested to evaluate the anastomosis.  Description of procedure: After we have completed the new gastrojejunostomy, I scrubbed out and obtained the Olympus endoscope. I gently placed endoscope in the patient's oropharynx and gently glided it down the esophagus without any difficulty under direct visualization. Once I was in the gastric pouch, I insufflated the pouch was air. The pouch was approximately 5.5 cm in size (GE junction at 42, anastomosis at 47cm). I was able to cannulate and advanced the scope through the gastrojejunostomy. Dr.Kinsinger had placed saline in the upper abdomen. Upon further insufflation of the gastric pouch there was no evidence of bubbles. Upon further inspection of the gastric pouch, the mucosa appeared normal. There is no evidence of any mucosal abnormality. The gastric pouch and Roux limb were decompressed. The width of the gastrojejunal anastomosis was at least 2 cm. The scope was withdrawn. The patient tolerated this portion of the procedure well. Please see Dr Amie Portland operative note for details regarding the laparoscopic roux-en-y gastric bypass.  [name] Redmond Pulling, MD, FACS General, Bariatric, & Minimally Invasive Surgery [facility], [place]

## 2016-05-18 NOTE — Anesthesia Postprocedure Evaluation (Signed)
Anesthesia Post Note  Patient: Linda [name]  Procedure(s) Performed: Procedure(s) (LRB): LAPAROSCOPIC ROUX-EN-Y GASTRIC WITH UPPER ENDO (N/A) UPPER GI ENDOSCOPY  Patient location during evaluation: PACU Anesthesia Type: General Level of consciousness: awake and alert and oriented Pain management: pain level controlled Vital Signs Assessment: post-procedure vital signs reviewed and stable Respiratory status: spontaneous breathing, nonlabored ventilation, respiratory function stable and patient connected to nasal cannula oxygen Cardiovascular status: blood pressure returned to baseline and stable Postop Assessment: no signs of nausea or vomiting Anesthetic complications: no    Last Vitals:  Vitals:   05/18/16 1415 05/18/16 1430  BP: (!) 156/101 133/87  Pulse: 92 91  Resp: 15 17  Temp:      Last Pain:  Vitals:   05/18/16 1430  TempSrc:   PainSc: 7                  Jaymir Struble A.

## 2016-05-18 NOTE — Op Note (Signed)
Preop Diagnosis: Obesity Class III  Postop Diagnosis: same  Procedure performed: laparoscopic Roux en Y gastric bypass  Assitant: Greer Pickerel  Indications:  The patient is a 40 y.o. year-old morbidly obese female who has been followed in the Bariatric Clinic as an outpatient. This patient was diagnosed with morbid obesity with a BMI of Body mass index is 48.2 kg/m. and significant co-morbidities including GERD.  The patient was counseled extensively in the Bariatric Outpatient Clinic and after a thorough explanation of the risks and benefits of surgery (including death from complications, bowel leak, infection such as peritonitis and/or sepsis, internal hernia, bleeding, need for blood transfusion, bowel obstruction, organ failure, pulmonary embolus, deep venous thrombosis, wound infection, incisional hernia, skin breakdown, and others entailed on the consent form) and after a compliant diet and exercise program, the patient was scheduled for an elective laparoscopic sleeve gastrectomy.  Description of Operation:  Following informed consent, the patient was taken to the operating room and placed on the operating table in the supine position.  She had previously received prophylactic antibiotics and subcutaneous heparin for DVT prophylaxis in the pre-op holding area.  After induction of general endotracheal anesthesia by the anesthesiologist, the patient underwent placement of sequential compression devices, Foley catheter and an oro-gastric tube.  A timeout was confirmed by the surgery and anesthesia teams.  The patient was adequately padded at all pressure points and placed on a footboard to prevent slippage from the OR table during extremes of position during surgery.  She underwent a routine sterile prep and drape of her entire abdomen.    Next, A transverse incision was made under the left subcostal area and a 40mm optical viewing trocar was introduced into the peritoneal cavity. Pneumoperitoneum  was applied with a high flow and low pressure. A laparoscope was inserted to confirm placement. A extraperitoneal block was then placed at the lateral abdominal wall using exparel . 5 additional trocars were placed: 1 27mm trocar to the left of the midline. 1 additional 73mm trocar in the left lateral area, 1 28mm trocar in the right mid abdomen, and 1 49mm trocar in the right subcostal area.  The greater omentum was flipped over the transverse colon and under the left lobe of the liver. The ligament of trietz was identified. 30cm of jejunum was measured starting from the ligament of Trietz. The mesentery was checked to ensure mobility. Next, a 54mm 2-47mm tristapler was used to divide the jejunum at this location. The harmonic scalpel was used to divide the mesentery down to the origin. A 1/2" penrose was sutured to the distal side. 100cm of jejunum was measured starting at the division. 2-0 silk was used to appose the biliary limb to the 100cm mark of jejunum in 2 places. Enterotomies were made in the biliary and common channels and a 49mm 2-3 tristapler was used to create the J-J anastomosis. A 2-0 silk was used to appose the enterotomy edges and a 670mm 2-3 tristapler was used to close the enterotomy. An anti-obstruction 2-0 silk suture was placed. Next, the mesenteric defect was closed with a 2-0 silk in running fashion.The J-J appeared patent and in neutral position.  Next, the omentum was divided using the Harmonic scalpel. The patient was placed in steep Reverse Trendelenberg position. A Nathanson retracted was placed through a subxiphoid incision and used to retract the liver. Adhesions of the lesser curve fat to the liver were taking down with harmonic scalpel. The fat pad over the fundus was incised to  free the fundus. Next, a position along the lesser curve 6cm from GE junction was identified. The pars flaccida was entered and the fat over the lesser curve divided to enter the lesser sac. Multiple 43mm  3-69mm tristaple firings were peformed to create a 6cm pouch. The Roux limb was identified using the placed penrose and brought up to the stomach in antecolic fashion. The limb was inspected to ensure a neutral position. A 2-0 vicryl suture was then used to create a posterior layer connecting the stomach to the Roux limb jejunum in running fashion. Next cautery was used to create an enterotomy along the medial aspect of this suture line and Harmonic scalpel used to create gastotomy. A 45mm 3-32mm tristapler was then used to create a 25-66mm anastomosis. 2 2-0 vicryl sutures were used in running fashion to close the gastrotomy. Finally, a 2-0 vicryl suture was used to close an anterior layer of stomach and jejunum over the anastomosis in running fashion. The penrose was removed from the Roux limb.  The assistant then went and performed an upper endoscopy and leak test. No bubbles were seen and the pouch and limb distended appropriately. The limb and pouch were deflated, the endoscope was removed. Hemostasis was ensured. There was a serosal tear on the proximal roux limb, this was repaired with a 2-0 vicryl interrupted suture. Pneumoperitoneum was evacuated, all ports were removed and all incisions closed with 4-0 monocryl suture in subcuticular fashion. Glue was put in place for dressing. The patient awoke from anesthesia and was brought to pacu in stable condition. All counts were correct.  Specimens:  None  Post-Op Plan:       Pain Management: PO, prn      Antibiotics: Prophylactic      Anticoagulation: Prophylactic, Starting now      Post Op Studies/Consults: Not applicable      Intended Discharge: within 48h      Intended Outpatient Follow-Up: Two Week      Intended Outpatient Studies: Not Applicable      Other: Not Applicable   Arta Bruce Kinsinger

## 2016-05-18 NOTE — Progress Notes (Signed)
Lab results noted. 

## 2016-05-18 NOTE — Anesthesia Procedure Notes (Signed)
Procedure Name: Intubation Performed by: Gean Maidens Pre-anesthesia Checklist: Patient identified, Suction available, Emergency Drugs available and Patient being monitored Patient Re-evaluated:Patient Re-evaluated prior to inductionOxygen Delivery Method: Circle system utilized Preoxygenation: Pre-oxygenation with 100% oxygen Intubation Type: IV induction Laryngoscope Size: Miller and 2 Grade View: Grade I Tube type: Oral Tube size: 7.0 mm Number of attempts: 1 Airway Equipment and Method: Stylet Placement Confirmation: ETT inserted through vocal cords under direct vision,  positive ETCO2 and breath sounds checked- equal and bilateral Secured at: 21 cm Tube secured with: Tape Dental Injury: Teeth and Oropharynx as per pre-operative assessment

## 2016-05-18 NOTE — Progress Notes (Signed)
CBC and Serum Creatinine drawn by lab. 

## 2016-05-19 LAB — CBC WITH DIFFERENTIAL/PLATELET
Basophils Absolute: 0 10*3/uL (ref 0.0–0.1)
Basophils Relative: 0 %
EOS PCT: 0 %
Eosinophils Absolute: 0 10*3/uL (ref 0.0–0.7)
HEMATOCRIT: 33.8 % — AB (ref 36.0–46.0)
Hemoglobin: 11.3 g/dL — ABNORMAL LOW (ref 12.0–15.0)
LYMPHS PCT: 20 %
Lymphs Abs: 2.1 10*3/uL (ref 0.7–4.0)
MCH: 29.4 pg (ref 26.0–34.0)
MCHC: 33.4 g/dL (ref 30.0–36.0)
MCV: 88 fL (ref 78.0–100.0)
MONO ABS: 0.5 10*3/uL (ref 0.1–1.0)
MONOS PCT: 5 %
NEUTROS ABS: 7.9 10*3/uL — AB (ref 1.7–7.7)
Neutrophils Relative %: 75 %
Platelets: 246 10*3/uL (ref 150–400)
RBC: 3.84 MIL/uL — ABNORMAL LOW (ref 3.87–5.11)
RDW: 13.3 % (ref 11.5–15.5)
WBC: 10.6 10*3/uL — ABNORMAL HIGH (ref 4.0–10.5)

## 2016-05-19 LAB — HEMOGLOBIN AND HEMATOCRIT, BLOOD
HEMATOCRIT: 35.1 % — AB (ref 36.0–46.0)
Hemoglobin: 11.5 g/dL — ABNORMAL LOW (ref 12.0–15.0)

## 2016-05-19 MED ORDER — PROMETHAZINE HCL 25 MG/ML IJ SOLN
12.5000 mg | Freq: Four times a day (QID) | INTRAMUSCULAR | Status: DC | PRN
  Administered 2016-05-19: 12.5 mg via INTRAVENOUS
  Filled 2016-05-19: qty 1

## 2016-05-19 MED ORDER — MORPHINE SULFATE (PF) 4 MG/ML IV SOLN
2.0000 mg | INTRAVENOUS | Status: DC | PRN
  Administered 2016-05-19 (×2): 4 mg via INTRAVENOUS
  Filled 2016-05-19 (×2): qty 1

## 2016-05-19 MED ORDER — SUMATRIPTAN SUCCINATE 50 MG PO TABS
50.0000 mg | ORAL_TABLET | Freq: Every day | ORAL | Status: DC | PRN
  Administered 2016-05-20: 50 mg via ORAL
  Filled 2016-05-19 (×3): qty 1

## 2016-05-19 NOTE — Progress Notes (Signed)
  Progress Note: Metabolic and Bariatric Surgery Service   Subjective: Fever overnight, possibly related to ear infection, currently ear feels fine. Some gas pain, relieved with walking, tolerated >131ml overnight  Objective: Vital signs in last 24 hours: Temp:  [97.6 F (36.4 C)-101.5 F (38.6 C)] 98.8 F (37.1 C) (11/08 0508) Pulse Rate:  [66-106] 88 (11/08 0508) Resp:  [12-18] 18 (11/08 0508) BP: (108-160)/(52-108) 127/71 (11/08 0508) SpO2:  [94 %-100 %] 100 % (11/08 0508) Weight:  [114.8 kg (253 lb)-114.8 kg (253 lb 2 oz)] 114.8 kg (253 lb) (11/07 0946) Last BM Date: 05/18/16  Intake/Output from previous day: 11/07 0701 - 11/08 0700 In: 3373.8 [P.O.:30; I.V.:3243.8; IV Piggyback:100] Out: 1610 [Urine:1600; Blood:10] Intake/Output this shift: No intake/output data recorded.  Lungs: CTAB  Cardiovascular: RRR  Abd: soft, NT, ND, wounds c/d/i  Extremities: no edema  Neuro: AOx4  Lab Results: CBC   Recent Labs  05/18/16 1408 05/19/16 0457  WBC 9.5 10.6*  HGB 12.6 11.3*  HCT 38.2 33.8*  PLT 217 246   BMET  Recent Labs  05/18/16 1408  CREATININE 0.87   PT/INR No results for input(s): LABPROT, INR in the last 72 hours. ABG No results for input(s): PHART, HCO3 in the last 72 hours.  Invalid input(s): PCO2, PO2  Studies/Results:  Anti-infectives: Anti-infectives    Start     Dose/Rate Route Frequency Ordered Stop   05/18/16 1015  vancomycin (VANCOCIN) IVPB 1000 mg/200 mL premix     1,000 mg 200 mL/hr over 60 Minutes Intravenous  Once 05/18/16 1003 05/18/16 1130   05/18/16 0917  cefoTEtan in Dextrose 5% (CEFOTAN) IVPB 2 g  Status:  Discontinued     2 g Intravenous On call to O.R. 05/18/16 0917 05/18/16 1003      Medications: Scheduled Meds: . acetaminophen  1,000 mg Intravenous Q6H  . famotidine (PEPCID) IV  20 mg Intravenous Q12H  . heparin subcutaneous  5,000 Units Subcutaneous Q8H  . [START ON 05/20/2016] protein supplement shake  2 oz Oral  Q2H   Continuous Infusions: . dextrose 5 % and 0.45% NaCl 125 mL/hr at 05/19/16 0559   PRN Meds:.oxyCODONE **AND** acetaminophen, acetaminophen (TYLENOL) oral liquid 160 mg/5 mL, morphine injection, ondansetron (ZOFRAN) IV  Assessment/Plan: Patient Active Problem List   Diagnosis Date Noted  . Obesity 05/18/2016  . Migraine without aura and without status migrainosus, not intractable 08/04/2015  . Tobacco use 08/04/2015  . Anxiety 04/15/2014  . Obesity (BMI 30-39.9) 01/31/2014  . Migraine 01/12/2012   s/p Procedure(s): LAPAROSCOPIC ROUX-EN-Y GASTRIC WITH UPPER ENDO UPPER GI ENDOSCOPY 05/18/2016, WBC normal this morning -POD 1 diet with early advance to shakes -potential discharge if tolerates additional 261ml water, >1/2 shake, and has no further fevers  Disposition:  LOS: 1 day  The patient will be in the hospital for normal postop protocol with potential for discharge if meets criteria  [name], MD [phone] [facility]

## 2016-05-19 NOTE — Plan of Care (Signed)
Problem: Food- and Nutrition-Related Knowledge Deficit (NB-1.1) Goal: Nutrition education Formal process to instruct or train a patient/client in a skill or to impart knowledge to help patients/clients voluntarily manage or modify food choices and eating behavior to maintain or improve health. Outcome: Completed/Met Date Met: 05/19/16 Nutrition Education Note  Received consult for diet education per DROP protocol.   Discussed 2 week post op diet with pt. Emphasized that liquids must be non carbonated, non caffeinated, and sugar free. Fluid goals discussed. Reviewed progression of diet to include soft proteins at 7-10 days post-op. Pt to follow up with outpatient bariatric RD for further diet progression after 2 weeks. Multivitamins and minerals also reviewed. Teach back method used, pt expressed understanding, expect good compliance.   Diet: First 2 Weeks  You will see the dietitian about two (2) weeks after your surgery. The dietitian will increase the types of foods you can eat if you are handling liquids well:  If you have severe vomiting or nausea and cannot handle clear liquids lasting longer than 1 day, call your surgeon  Protein Shake  Drink at least 2 ounces of shake 5-6 times per day  Each serving of protein shakes (usually 8 - 12 ounces) should have a minimum of:  15 grams of protein  And no more than 5 grams of carbohydrate  Goal for protein each day:  Men = 80 grams per day  Women = 60 grams per day  Protein powder may be added to fluids such as non-fat milk or Lactaid milk or Soy milk (limit to 35 grams added protein powder per serving)   Hydration  Slowly increase the amount of water and other clear liquids as tolerated (See Acceptable Fluids)  Slowly increase the amount of protein shake as tolerated  Sip fluids slowly and throughout the day  May use sugar substitutes in small amounts (no more than 6 - 8 packets per day; i.e. Splenda)   Fluid Goal  The first goal is to  drink at least 8 ounces of protein shake/drink per day (or as directed by the nutritionist); some examples of protein shakes are Syntrax Nectar, Adkins Advantage, EAS Edge HP, and Unjury. See handout from pre-op Bariatric Education Class:  Slowly increase the amount of protein shake you drink as tolerated  You may find it easier to slowly sip shakes throughout the day  It is important to get your proteins in first  Your fluid goal is to drink 64 - 100 ounces of fluid daily  It may take a few weeks to build up to this  32 oz (or more) should be clear liquids  And  32 oz (or more) should be full liquids (see below for examples)  Liquids should not contain sugar, caffeine, or carbonation   Clear Liquids:  Water or Sugar-free flavored water (i.e. Fruit H2O, Propel)  Decaffeinated coffee or tea (sugar-free)  Crystal Lite, Wyler's Lite, Minute Maid Lite  Sugar-free Jell-O  Bouillon or broth  Sugar-free Popsicle: *Less than 20 calories each; Limit 1 per day   Full Liquids:  Protein Shakes/Drinks + 2 choices per day of other full liquids  Full liquids must be:  No More Than 12 grams of Carbs per serving  No More Than 3 grams of Fat per serving  Strained low-fat cream soup  Non-Fat milk  Fat-free Lactaid Milk  Sugar-free yogurt (Dannon Lite & Fit, Greek yogurt)     Lindsey Baker, MS, RD, LDN Pager: [phone] After Hours Pager: [phone]    

## 2016-05-19 NOTE — Progress Notes (Signed)
Patient alert and oriented, Post op day 1.  Provided support and encouragement.  Encouraged pulmonary toilet, ambulation and small sips of liquids.  All questions answered.  Will continue to monitor. 

## 2016-05-20 LAB — CBC WITH DIFFERENTIAL/PLATELET
BASOS ABS: 0 10*3/uL (ref 0.0–0.1)
Basophils Relative: 0 %
Eosinophils Absolute: 0.1 10*3/uL (ref 0.0–0.7)
Eosinophils Relative: 1 %
HEMATOCRIT: 34.6 % — AB (ref 36.0–46.0)
HEMOGLOBIN: 11.3 g/dL — AB (ref 12.0–15.0)
LYMPHS PCT: 21 %
Lymphs Abs: 1.9 10*3/uL (ref 0.7–4.0)
MCH: 29.2 pg (ref 26.0–34.0)
MCHC: 32.7 g/dL (ref 30.0–36.0)
MCV: 89.4 fL (ref 78.0–100.0)
MONO ABS: 0.6 10*3/uL (ref 0.1–1.0)
Monocytes Relative: 7 %
NEUTROS ABS: 6.4 10*3/uL (ref 1.7–7.7)
NEUTROS PCT: 71 %
Platelets: 245 10*3/uL (ref 150–400)
RBC: 3.87 MIL/uL (ref 3.87–5.11)
RDW: 13.2 % (ref 11.5–15.5)
WBC: 9 10*3/uL (ref 4.0–10.5)

## 2016-05-20 NOTE — Progress Notes (Signed)
Patient alert and oriented, pain is controlled. Patient is tolerating fluids,  advanced to protein shake yesterday, patient is tolerating well. Reviewed Gastric Bypass discharge instructions with patient and patient is able to articulate understanding. Provided information on BELT program, Support Group and [facility] outpatient pharmacy. All questions answered, will continue to monitor.

## 2016-05-20 NOTE — Discharge Instructions (Signed)

## 2016-05-20 NOTE — Discharge Summary (Signed)
Physician Discharge Summary  Linda [name] [id number] DOB: [birth year] DOA: 05/18/2016  PCP: Webb Silversmith, NP  Admit date: 05/18/2016 Discharge date: 05/20/2016  Recommendations for Outpatient Follow-up:  1.  (include homehealth, outpatient follow-up instructions, specific recommendations for PCP to follow-up on, etc.)  Follow-up Information    [name], MD. Go on 06/10/2016.   Specialty:  General Surgery Why:  at 2:45 PM for post-op check Contact information: [place] [address] [phone]        [name], MD Follow up.   Specialty:  General Surgery Contact information: [place] [address] [phone]          Discharge Diagnoses:  Active Problems:   Obesity   Surgical Procedure: Laparoscopic Sleeve Gastrectomy, upper endoscopy  Discharge Condition: Good Disposition: Home  Diet recommendation: Postoperative sleeve gastrectomy diet (liquids only)  Filed Weights   05/18/16 0928 05/18/16 0946  Weight: 114.8 kg (253 lb 2 oz) 114.8 kg (253 lb)     Hospital Course:  The patient was admitted after undergoing Roux-en-Y gastric bypass. POD 0 she ambulated well. POD 1 she was started on the water diet protocol and tolerated 200 ml in the first shift. Once meeting the water amount she was advanced to bariatric protein shakes which they tolerated and were discharged home POD 2.  Treatments: surgery: Roux-en-Y gastric bypass  Discharge Instructions  Discharge Instructions    Call MD for:  difficulty breathing, headache or visual disturbances    Complete by:  As directed    Call MD for:  persistant nausea and vomiting    Complete by:  As directed    Call MD for:  redness, tenderness, or signs of infection (pain, swelling, redness, odor or green/yellow discharge around incision site)    Complete by:  As directed    Call MD for:  severe uncontrolled pain    Complete by:  As  directed    Call MD for:  temperature >100.4    Complete by:  As directed    Discharge wound care:    Complete by:  As directed    [place] to shower tomorrow  Glue will likely peel off in 1-3 weeks   Increase activity slowly    Complete by:  As directed    Lifting restrictions    Complete by:  As directed    Do not lift more than 20 pounds for 3-4 weeks       Medication List    STOP taking these medications   Naltrexone-Bupropion HCl ER 8-90 MG Tb12   predniSONE 10 MG tablet Commonly known as:  DELTASONE     TAKE these medications   albuterol 108 (90 Base) MCG/ACT inhaler Commonly known as:  PROVENTIL HFA;VENTOLIN HFA Inhale 2 puffs into the lungs every 6 (six) hours as needed for wheezing or shortness of breath.   ALPRAZolam 0.25 MG tablet Commonly known as:  XANAX Take 1 tablet (0.25 mg total) by mouth 2 (two) times daily as needed. What changed:  reasons to take this   naproxen sodium 220 MG tablet Commonly known as:  ANAPROX Take 440 mg by mouth 2 (two) times daily as needed (for pain). Notes to patient:  Avoid NSAIDs for 6-8 weeks after surgery   OVER THE COUNTER MEDICATION Take 1 tablet by mouth daily. [name], Vitamin D [id number] IU*   pantoprazole 40 MG tablet Commonly known as:  PROTONIX TAKE 1 TABLET (40  MG TOTAL) BY MOUTH DAILY. What changed:  See the new instructions.   zolmitriptan 5 MG tablet Commonly known as:  ZOMIG Take one tablet by mouth at on set of headache can take one more tablet 2 hours later if needed   zonisamide 50 MG capsule Commonly known as:  ZONEGRAN Take 4 capsules (200 mg total) by mouth daily.      Follow-up Information    [name], MD. Go on 06/10/2016.   Specialty:  General Surgery Why:  at 2:45 PM for post-op check Contact information: [place] [address] [phone]        [name], MD Follow up.   Specialty:  General Surgery Contact information: [place] [address] [phone]            The results of significant diagnostics from this hospitalization (including imaging, microbiology, ancillary and laboratory) are listed below for reference.    Significant Diagnostic Studies: No results found.  Labs: Basic Metabolic Panel:  Recent Labs Lab 05/14/16 1218 05/18/16 1408  NA 139  --   K 3.9  --   CL 104  --   CO2 29  --   GLUCOSE 91  --   BUN 12  --   CREATININE 0.82 0.87  CALCIUM 9.0  --    Liver Function Tests:  Recent Labs Lab 05/14/16 1218  AST 17  ALT 15  ALKPHOS 77  BILITOT 0.7  PROT 7.2  ALBUMIN 4.1    CBC:  Recent Labs Lab 05/14/16 1218 05/18/16 1408 05/19/16 0457 05/19/16 1548 05/20/16 0423  WBC 8.5 9.5 10.6*  --  9.0  NEUTROABS 5.3  --  7.9*  --  6.4  HGB 12.3 12.6 11.3* 11.5* 11.3*  HCT 37.3 38.2 33.8* 35.1* 34.6*  MCV 87.8 88.4 88.0  --  89.4  PLT 269 217 246  --  245    CBG: No results for input(s): GLUCAP in the last 168 hours.  Active Problems:   Obesity   Time coordinating discharge: <54min

## 2016-05-20 NOTE — Progress Notes (Signed)
Pt was given discharge instructions, all questions were answered. Pt was taken to main entrance via wheel chair by RN.  Linda [name]

## 2016-05-25 ENCOUNTER — Emergency Department (HOSPITAL_COMMUNITY)
Admission: EM | Admit: 2016-05-25 | Discharge: 2016-05-25 | Disposition: A | Discharge status: Home / Self Care | Payer: Federal, State, Local not specified - PPO | Attending: Physician Assistant | Admitting: Physician Assistant

## 2016-05-25 ENCOUNTER — Ambulatory Visit (INDEPENDENT_AMBULATORY_CARE_PROVIDER_SITE_OTHER): Payer: Federal, State, Local not specified - PPO | Admitting: Neurology

## 2016-05-25 ENCOUNTER — Encounter: Payer: Self-pay | Admitting: Neurology

## 2016-05-25 ENCOUNTER — Encounter (HOSPITAL_COMMUNITY): Payer: Self-pay | Admitting: Emergency Medicine

## 2016-05-25 VITALS — BP 112/60 | HR 121 | Wt 242.0 lb

## 2016-05-25 DIAGNOSIS — J45909 Unspecified asthma, uncomplicated: Secondary | ICD-10-CM | POA: Insufficient documentation

## 2016-05-25 DIAGNOSIS — R21 Rash and other nonspecific skin eruption: Secondary | ICD-10-CM

## 2016-05-25 DIAGNOSIS — T7840XA Allergy, unspecified, initial encounter: Secondary | ICD-10-CM

## 2016-05-25 DIAGNOSIS — T7849XA Other allergy, initial encounter: Secondary | ICD-10-CM | POA: Diagnosis not present

## 2016-05-25 DIAGNOSIS — G43009 Migraine without aura, not intractable, without status migrainosus: Secondary | ICD-10-CM | POA: Diagnosis not present

## 2016-05-25 MED ORDER — DIPHENHYDRAMINE HCL 25 MG PO CAPS
50.0000 mg | ORAL_CAPSULE | Freq: Once | ORAL | Status: AC
  Administered 2016-05-25: 50 mg via ORAL
  Filled 2016-05-25: qty 2

## 2016-05-25 MED ORDER — CALAMINE EX LOTN
TOPICAL_LOTION | Freq: Once | CUTANEOUS | Status: AC
  Administered 2016-05-25: 1 via TOPICAL
  Filled 2016-05-25: qty 118

## 2016-05-25 MED ORDER — DIPHENHYDRAMINE HCL 25 MG PO TABS: 25.0000 mg | ORAL_TABLET | ORAL | 0 refills | Status: DC | PRN

## 2016-05-25 MED ORDER — ZONISAMIDE 100 MG PO CAPS: 200.0000 mg | ORAL_CAPSULE | Freq: Every day | ORAL | 11 refills | Status: DC

## 2016-05-25 MED ORDER — ZOLMITRIPTAN 5 MG PO TABS: ORAL_TABLET | ORAL | 11 refills | Status: DC

## 2016-05-25 NOTE — Patient Instructions (Signed)
1.  Continue zonisamide 200mg  daily 2.  Use Zomig 5mg  as needed (1 tablet at earliest onset of headache and may repeat once after 2 hours if needed) 3.  Follow up in 6 months

## 2016-05-25 NOTE — ED Notes (Signed)
Patient ambulatory and independent at discharge.  Verbalized understanding of discharge instructions. 

## 2016-05-25 NOTE — Discharge Instructions (Signed)
We discussed with the on call doctor for Dr. Rivka Barbara.  We want you to call them in the monring.  You can use calamine lotion to help with symptoms along with beandryl to help with itching.

## 2016-05-25 NOTE — ED Triage Notes (Signed)
Pt c/o pruritic raised red skin around 4 laparascopic surgical sites of Route-en-Y. Wheals spread to nonsurgical sites away from wheals. Surgery was last Tuesday, 05/18/16. Benadryl helps.

## 2016-05-25 NOTE — ED Provider Notes (Signed)
[place] DEPT Provider Note   CSN: [id number] Arrival date & time: 05/25/16  1650     History   Chief Complaint Chief Complaint  Patient presents with  . Post-op Problem  . Rash    HPI Linda [name] is a 40 y.o. female.  HPI   Patient is a 40 year old female presenting with a postop shoe. Patient had Roux-en-Y done by Dr. Alvino Blood was discharged on November 9. Last night she started to have itching surrounding this site for the laparatomy sites were.   Patient took 50 Benadryl today which helped immensely, however this is wore not. She called the office but was unable to get an appointment. She reported here for further evaluation.  Past Medical History:  Diagnosis Date  . Anginal pain ([place])   . Anxiety   . Asthma   . Bleeding    POST PARTUM BLEEDING  . Bronchitis with flu 06/12/11  . GERD (gastroesophageal reflux disease)   . Heart murmur    while pregnant  . Migraine   . PONV (postoperative nausea and vomiting)     Patient Active Problem List   Diagnosis Date Noted  . Obesity 05/18/2016  . Migraine without aura and without status migrainosus, not intractable 08/04/2015  . Tobacco use 08/04/2015  . Anxiety 04/15/2014  . Obesity (BMI 30-39.9) 01/31/2014  . Migraine 01/12/2012    Past Surgical History:  Procedure Laterality Date  . ABDOMINAL HYSTERECTOMY  12/20012   Total  . ENDOMETRIAL ABLATION  2012  . GASTRIC ROUX-EN-Y N/A 05/18/2016   Procedure: LAPAROSCOPIC ROUX-EN-Y GASTRIC WITH UPPER ENDO;  Surgeon: Arta Bruce Kinsinger, MD;  Location: [facility] ORS;  Service: General;  Laterality: N/A;  . MYRINGOTOMY WITH TUBE PLACEMENT     X 3 SINCE ORRIGINALLY PLACE IN 2010  . TONSILECTOMY, ADENOIDECTOMY, BILATERAL MYRINGOTOMY AND TUBES  2010  . TONSILLECTOMY    . TUBAL LIGATION  08/2010  . UPPER GI ENDOSCOPY  05/18/2016   Procedure: UPPER GI ENDOSCOPY;  Surgeon: Arta Bruce Kinsinger, MD;  Location: [facility] ORS;  Service: General;;    OB History    No data  available       Home Medications    Prior to Admission medications   Medication Sig Start Date End Date Taking? Authorizing Provider  albuterol (PROVENTIL HFA;VENTOLIN HFA) 108 (90 Base) MCG/ACT inhaler Inhale 2 puffs into the lungs every 6 (six) hours as needed for wheezing or shortness of breath. 09/08/15  Yes Jearld Fenton, NP  Calcium Citrate-Vitamin D (CALCIUM CITRATE CHEWY BITE) 500-500 MG-UNIT CHEW Chew 1 each by mouth 4 (four) times daily.   Yes Historical Provider, MD  naproxen sodium (ANAPROX) 220 MG tablet Take 440 mg by mouth 2 (two) times daily as needed (for pain).   Yes Historical Provider, MD  OVER THE COUNTER MEDICATION Take 1 tablet by mouth 2 (two) times daily.   Yes Historical Provider, MD  zonisamide (ZONEGRAN) 100 MG capsule Take 2 capsules (200 mg total) by mouth daily. 05/25/16  Yes Adam Telford Nab, DO  ALPRAZolam Duanne Moron) 0.25 MG tablet Take 1 tablet (0.25 mg total) by mouth 2 (two) times daily as needed. Patient not taking: Reported on 05/25/2016 05/14/16   Jearld Fenton, NP  pantoprazole (PROTONIX) 40 MG tablet TAKE 1 TABLET (40 MG TOTAL) BY MOUTH DAILY. Patient not taking: Reported on 05/25/2016 12/31/15   Jearld Fenton, NP  zolmitriptan (ZOMIG) 5 MG tablet Take one tablet by mouth at on set of headache can take one  more tablet 2 hours later if needed Patient not taking: Reported on 05/25/2016 05/25/16   Pieter Partridge, DO    Family History Family History  Problem Relation Age of Onset  . Hypertension Father   . Diabetes Father   . Hypertension Mother   . Cancer Mother     uterine  . Stroke Mother   . Diabetes Maternal Aunt   . Diabetes Maternal Uncle   . Cancer Maternal Grandmother     PANCREATIC  . Hypertension Maternal Grandmother   . Diabetes Maternal Grandmother   . Cancer Maternal Grandfather     LUNG  . Hypertension Maternal Grandfather   . Diabetes Maternal Grandfather   . Cancer Paternal Grandmother     THROAT  . Alzheimer's disease Paternal  Grandmother     Social History Social History  Substance Use Topics  . Smoking status: Never Smoker  . Smokeless tobacco: Never Used  . Alcohol use 0.6 oz/week    1 Standard drinks or equivalent per week     Comment: occasional (not much)     Allergies   Bacitra-neomycin-polymyxin-hc and Hydrocodone-acetaminophen   Review of Systems Review of Systems  Skin: Positive for color change and rash.  All other systems reviewed and are negative.    Physical Exam Updated Vital Signs BP 144/100 (BP Location: Left Arm)   Pulse 100   Temp 98 F (36.7 C) (Oral)   Resp 17   LMP 07/12/2011   SpO2 100%   Physical Exam  Constitutional: She is oriented to person, place, and time. She appears well-developed and well-nourished.  HENT:  Head: Normocephalic and atraumatic.  Eyes: Right eye exhibits no discharge.  Cardiovascular: Normal rate, regular rhythm and normal heart sounds.   No murmur heard. Pulmonary/Chest: Effort normal and breath sounds normal. She has no wheezes. She has no rales.  Abdominal: Soft. She exhibits no distension. There is no tenderness.  Neurological: She is oriented to person, place, and time.  Skin: Skin is warm and dry. She is not diaphoretic.  Raised uticarial areas surrounding sites on stomach. Picture in note.  Psychiatric: She has a normal mood and affect.  Nursing note and vitals reviewed.    ED Treatments / Results  Labs (all labs ordered are listed, but only abnormal results are displayed) Labs Reviewed - No data to display  EKG  EKG Interpretation None       Radiology No results found.  Procedures Procedures (including critical care time)  Medications Ordered in ED Medications  diphenhydrAMINE (BENADRYL) capsule 50 mg (50 mg Oral Given 05/25/16 2045)     Initial Impression / Assessment and Plan / ED Course  I have reviewed the triage vital signs and the nursing notes.  Pertinent labs & imaging results that were available  during my care of the patient were reviewed by me and considered in my medical decision making (see chart for details).  Clinical Course     Patient is a very pleasant 40 year old female 1 week out from Roux-en-Y by Dr. Alvino Blood. Patient reporting with allergic reaction localized to a laparotomy sites. Picture noted below. Discussed with Dr. Hassell Done who is on call. Will have patient take Benadryl, use calamine lotion. He does not recommend prednisone currently as affect the Roux        Final Clinical Impressions(s) / ED Diagnoses   Final diagnoses:  None    New Prescriptions New Prescriptions   No medications on file     [place],  MD 05/25/16 2055

## 2016-05-25 NOTE — Progress Notes (Signed)
NEUROLOGY FOLLOW UP OFFICE NOTE  SNEZANA PRESBY [name]  HISTORY OF PRESENT ILLNESS: Linda [name] is a 40 year old right-handed woman with history of hysterectomy, migraine and panic attacks who follows up for migraine.  UPDATE:  Overall, migraines are well-controlled but they have increased since having gastric bypass surgery last week.  She notes that opioids are a migraine trigger. Intensity:  6-10/10 Duration:  brief with Zomig 5mg  (one time, it made migraines worse) Frequency:  3 days per month Current abortive medication:  Zomig 5mg  po Antihypertensive medications:  none Antidepressant medications:  none Anticonvulsant medications:  zonisamide 200mg  Vitamins/Herbal/Supplements:  MVI Other therapy:  none Other medication:  alprazolam, pantoprazole   HISTORY: Onset:  Around age 28 Location:  Varies.  Either temple, bi-frontal, sometimes radiating down the right side of neck Quality:  Migraines are severe hammer.  Daily headaches are dull throbbing Initial Intensity:  Migraines are 10/10.  Daily headaches 3/10; May: 6-10/10 Aura:  no Prodrome:  no Associated symptoms:  Nausea, phonophobia, osmophobia, sometimes sees spots, sometimes vomiting.    Initial Duration:  3-5 days; May: brief with Zomig Initial Frequency:  Once a month (but has daily headache); May: once a week Triggers/exacerbating factors:  Wine, hunger, scents Relieving factors:  none Activity:  Cannot function with migraines   Past abortive therapy:  Fioricet, Maxalt 5mg  (initially helpful), Relpax (ineffective), sumatriptan 50mg  po (caused head burning), Exedrin, ibuprofen, naproxen, Midrin Past preventative therapy:  Topamax (side effects)   Current abortive therapy:  none Current preventative therapy:  none Other medications:  citalopram 10mg  (just started 6 days ago)   Caffeine:  Tea every other day Alcohol:  rarely   Smoker:  no Diet:  Poor.  Stays hydrated Exercise:  no Depression/stress:   She has been experiencing panic attacks.  Just started citalopram. Sleep hygiene:  Poor.  Prolonged sleep latency.  Wakes up often.  About 3-4 hours per night Family history of headache:  no   She had a CT of the head performed on 09/08/06 for headache, which was reviewed, which was unremarkable.  It did reveal a punctate calcification along the right transverse sinus, within normal limits, and is showed some fluid in the mastoid air cells bilaterally.  PAST MEDICAL HISTORY: Past Medical History:  Diagnosis Date  . Anginal pain ([place])   . Anxiety   . Asthma   . Bleeding    POST PARTUM BLEEDING  . Bronchitis with flu 06/12/11  . GERD (gastroesophageal reflux disease)   . Heart murmur    while pregnant  . Migraine   . PONV (postoperative nausea and vomiting)     MEDICATIONS: Current Outpatient Prescriptions on File Prior to Visit  Medication Sig Dispense Refill  . albuterol (PROVENTIL HFA;VENTOLIN HFA) 108 (90 Base) MCG/ACT inhaler Inhale 2 puffs into the lungs every 6 (six) hours as needed for wheezing or shortness of breath. 1 Inhaler 2  . ALPRAZolam (XANAX) 0.25 MG tablet Take 1 tablet (0.25 mg total) by mouth 2 (two) times daily as needed. (Patient taking differently: Take 0.25 mg by mouth 2 (two) times daily as needed for anxiety. ) 20 tablet 0  . OVER THE COUNTER MEDICATION Take 1 tablet by mouth daily. [name], Vitamin D [id number] IU*    . pantoprazole (PROTONIX) 40 MG tablet TAKE 1 TABLET (40 MG TOTAL) BY MOUTH DAILY. (Patient taking differently: TAKE 1 TABLET (40 MG TOTAL) BY MOUTH twice per day) 30 tablet 8  . naproxen sodium (ANAPROX) 220  MG tablet Take 440 mg by mouth 2 (two) times daily as needed (for pain).     No current facility-administered medications on file prior to visit.     ALLERGIES: Allergies  Allergen Reactions  . Bacitra-Neomycin-Polymyxin-Hc Other (See Comments)    Pain and swelling of ear canal  . Hydrocodone-Acetaminophen Rash    FAMILY  HISTORY: Family History  Problem Relation Age of Onset  . Hypertension Father   . Diabetes Father   . Hypertension Mother   . Cancer Mother     uterine  . Stroke Mother   . Diabetes Maternal Aunt   . Diabetes Maternal Uncle   . Cancer Maternal Grandmother     PANCREATIC  . Hypertension Maternal Grandmother   . Diabetes Maternal Grandmother   . Cancer Maternal Grandfather     LUNG  . Hypertension Maternal Grandfather   . Diabetes Maternal Grandfather   . Cancer Paternal Grandmother     THROAT  . Alzheimer's disease Paternal Grandmother     SOCIAL HISTORY: Social History   Social History  . Marital status: Divorced    Spouse name: N/A  . Number of children: N/A  . Years of education: N/A   Occupational History  . Not on file.   Social History Main Topics  . Smoking status: Never Smoker  . Smokeless tobacco: Never Used  . Alcohol use 0.6 oz/week    1 Standard drinks or equivalent per week     Comment: occasional (not much)  . Drug use: No  . Sexual activity: Not on file   Other Topics Concern  . Not on file   Social History Narrative  . No narrative on file    REVIEW OF SYSTEMS: Constitutional: No fevers, chills, or sweats, no generalized fatigue, change in appetite Eyes: No visual changes, double vision, eye pain Ear, nose and throat: No hearing loss, ear pain, nasal congestion, sore throat Cardiovascular: No chest pain, palpitations Respiratory:  No shortness of breath at rest or with exertion, wheezes GastrointestinaI: No nausea, vomiting, diarrhea, abdominal pain, fecal incontinence Genitourinary:  No dysuria, urinary retention or frequency Musculoskeletal:  No neck pain, back pain Integumentary: No rash, pruritus, skin lesions Neurological: as above Psychiatric: No depression, insomnia, anxiety Endocrine: No palpitations, fatigue, diaphoresis, mood swings, change in appetite, change in weight, increased thirst Hematologic/Lymphatic:  No purpura,  petechiae. Allergic/Immunologic: no itchy/runny eyes, nasal congestion, recent allergic reactions, rashes  PHYSICAL EXAM: Vitals:   05/25/16 0734  BP: 112/60  Pulse: (!) 121   General: No acute distress.  Patient appears well-groomed.   Head:  Normocephalic/atraumatic Eyes:  Fundi examined but not visualized Neck: supple, no paraspinal tenderness, full range of motion Heart:  Regular rate and rhythm Lungs:  Clear to auscultation bilaterally Back: No paraspinal tenderness Neurological Exam: alert and oriented to person, place, and time. Attention span and concentration intact, recent and remote memory intact, fund of knowledge intact.  Speech fluent and not dysarthric, language intact.  CN II-XII intact. Bulk and tone normal, muscle strength 5/5 throughout.  Sensation to light touch  intact.  Deep tendon reflexes 2+ throughout.  Finger to nose testing intact.  Gait normal  IMPRESSION: Migraine Morbid obesity- status post gastric bypass  PLAN: Zonisamide 200mg  daily Zomig 5mg  as needed Follow up in 6 months.  22 minutes spent face to face with patient, over 50% spent counseling.  Metta Clines, DO  CC:  Webb Silversmith, NP

## 2016-05-26 DIAGNOSIS — L309 Dermatitis, unspecified: Secondary | ICD-10-CM | POA: Diagnosis not present

## 2016-06-01 DIAGNOSIS — Z6838 Body mass index (BMI) 38.0-38.9, adult: Secondary | ICD-10-CM | POA: Diagnosis not present

## 2016-06-01 DIAGNOSIS — Z713 Dietary counseling and surveillance: Secondary | ICD-10-CM | POA: Diagnosis not present

## 2016-06-01 DIAGNOSIS — E669 Obesity, unspecified: Secondary | ICD-10-CM

## 2016-06-01 DIAGNOSIS — Z79899 Other long term (current) drug therapy: Secondary | ICD-10-CM | POA: Diagnosis not present

## 2016-06-02 ENCOUNTER — Ambulatory Visit: Payer: Federal, State, Local not specified - PPO | Admitting: Neurology

## 2016-06-05 NOTE — Progress Notes (Signed)
Bariatric Class:  Appt start time: 1530 end time:  1630.  2 Week Post-Operative Nutrition Class  Patient was seen on 06/01/2016 for Post-Operative Nutrition education at [facility].   Surgery date: 05/18/2016 Surgery type: RYGB [facility]: 249 lbs on 02/18/2016, 255.4 on 05/17/16 Weight today: 237.0 lbs  Weight change: 18.4 lbs  TANITA  BODY COMP RESULTS  05/17/16 06/01/16   BMI (kg/m^2) 39.4 36.6   Fat Mass (lbs) 129.2 119.2   Fat Free Mass (lbs) 126.2 117.8   Total Body Water (lbs) 93.2 86.6   The following the learning objectives were met by the patient during this course:  Identifies Phase 3A (Soft, High Proteins) Dietary Goals and will begin from 2 weeks post-operatively to 2 months post-operatively  Identifies appropriate sources of fluids and proteins   States protein recommendations and appropriate sources post-operatively  Identifies the need for appropriate texture modifications, mastication, and bite sizes when consuming solids  Identifies appropriate multivitamin and calcium sources post-operatively  Describes the need for physical activity post-operatively and will follow MD recommendations  States when to call healthcare provider regarding medication questions or post-operative complications  Handouts given during class include:  Phase 3A: Soft, High Protein Diet Handout  Follow-Up Plan: Patient will follow-up at [facility] in 6 weeks for 2 month post-op nutrition visit for diet advancement per MD.

## 2016-06-07 ENCOUNTER — Ambulatory Visit: Payer: Federal, State, Local not specified - PPO

## 2016-06-10 ENCOUNTER — Other Ambulatory Visit: Payer: Self-pay | Admitting: General Surgery

## 2016-06-10 ENCOUNTER — Ambulatory Visit: Payer: Self-pay | Admitting: General Surgery

## 2016-06-10 DIAGNOSIS — E860 Dehydration: Secondary | ICD-10-CM

## 2016-06-10 MED ORDER — SODIUM CHLORIDE 0.9 % IV SOLN: Freq: Once | INTRAVENOUS | Status: DC

## 2016-06-10 MED ORDER — SODIUM CHLORIDE 0.9 % IV BOLUS (SEPSIS): 2000.0000 mL | Freq: Once | INTRAVENOUS | Status: DC

## 2016-06-11 ENCOUNTER — Ambulatory Visit (HOSPITAL_COMMUNITY)
Admission: RE | Admit: 2016-06-11 | Discharge: 2016-06-11 | Disposition: A | Discharge status: Home / Self Care | Payer: Federal, State, Local not specified - PPO | Source: Ambulatory Visit | Attending: General Surgery | Admitting: General Surgery

## 2016-06-11 ENCOUNTER — Ambulatory Visit: Payer: Federal, State, Local not specified - PPO | Admitting: Dietician

## 2016-06-11 ENCOUNTER — Other Ambulatory Visit: Payer: Self-pay | Admitting: Internal Medicine

## 2016-06-11 ENCOUNTER — Other Ambulatory Visit: Payer: Self-pay | Admitting: General Surgery

## 2016-06-11 DIAGNOSIS — E860 Dehydration: Secondary | ICD-10-CM | POA: Diagnosis not present

## 2016-06-11 LAB — COMPREHENSIVE METABOLIC PANEL
ALK PHOS: 74 U/L (ref 38–126)
ALT: 21 U/L (ref 14–54)
AST: 17 U/L (ref 15–41)
Albumin: 3.9 g/dL (ref 3.5–5.0)
Anion gap: 7 (ref 5–15)
BILIRUBIN TOTAL: 0.6 mg/dL (ref 0.3–1.2)
BUN: 12 mg/dL (ref 6–20)
CALCIUM: 8.1 mg/dL — AB (ref 8.9–10.3)
CO2: 23 mmol/L (ref 22–32)
CREATININE: 0.73 mg/dL (ref 0.44–1.00)
Chloride: 110 mmol/L (ref 101–111)
Glucose, Bld: 106 mg/dL — ABNORMAL HIGH (ref 65–99)
Potassium: 3.1 mmol/L — ABNORMAL LOW (ref 3.5–5.1)
Sodium: 140 mmol/L (ref 135–145)
TOTAL PROTEIN: 6.8 g/dL (ref 6.5–8.1)

## 2016-06-11 MED ORDER — THIAMINE HCL 100 MG/ML IJ SOLN
INTRAVENOUS | Status: DC
  Administered 2016-06-11: 11:00:00 via INTRAVENOUS
  Filled 2016-06-11 (×4): qty 1000

## 2016-06-11 MED ORDER — ONDANSETRON 4 MG PO TBDP
4.0000 mg | ORAL_TABLET | ORAL | Status: DC | PRN
  Filled 2016-06-11: qty 1

## 2016-06-11 MED ORDER — ALPRAZOLAM 0.25 MG PO TABS: 0.2500 mg | ORAL_TABLET | Freq: Two times a day (BID) | ORAL | 0 refills | Status: DC | PRN

## 2016-06-11 MED ORDER — ONDANSETRON HCL 4 MG/2ML IJ SOLN: 4.0000 mg | INTRAMUSCULAR | Status: DC | PRN

## 2016-06-11 MED ORDER — PANTOPRAZOLE SODIUM 40 MG PO PACK
40.0000 mg | PACK | Freq: Every day | ORAL | Status: DC
  Filled 2016-06-11: qty 20

## 2016-06-11 MED ORDER — SODIUM CHLORIDE 0.9 % IV BOLUS (SEPSIS)
2000.0000 mL | Freq: Once | INTRAVENOUS | Status: AC
  Administered 2016-06-11: 1000 mL via INTRAVENOUS

## 2016-06-11 NOTE — Telephone Encounter (Signed)
Ok to phone in to be filled on or after 12/3

## 2016-06-11 NOTE — Telephone Encounter (Signed)
Last filled 05/14/16--please advise

## 2016-06-11 NOTE — Progress Notes (Signed)
Patient arrived for IV hydration.  No orders in EPIC.  Dr. Amie Portland office notified.  Will wait for orders.

## 2016-06-11 NOTE — Telephone Encounter (Signed)
Rx called in to pharmacy. 

## 2016-06-11 NOTE — Progress Notes (Signed)
Patient ID: Linda [name], female   DOB: Oct 24, 1975, 40 y.o.   MRN: [id number] Provider: Arta Bruce Kinsinger, MD  Associated Diagnosis:CHL IP SUR GENERAL SURGERY FLUID REPLACEMENT    Procedure: Infusion of 1089ml of 0.9 % normal saline and infusion  Of 1000 ml 0.9% normal saline with folic acid, thiamine and multivitamin added.  Patient tolerated infusion well. Started the infusion out at 150 ml per hour each bag and then gradually increased volume as tolerated until both bags infused. Blood drawn at end of infusion for CMET which was sent to the lab. Went over discharge instructions and copy given to patient. She voided x1. Alert, oriented and ambulatory at time of discharge. Discharged home.

## 2016-06-11 NOTE — Discharge Instructions (Signed)
1000 ml 0.9% Normal Saline with thiamine, multivitamins and folic acid and also...1000 ml 0.9% normal saline bolus.

## 2016-06-14 ENCOUNTER — Other Ambulatory Visit: Payer: Self-pay | Admitting: General Surgery

## 2016-06-14 MED ORDER — SUCRALFATE 1 G PO TABS: 1.0000 g | ORAL_TABLET | Freq: Four times a day (QID) | ORAL | 1 refills | Status: DC

## 2016-06-15 ENCOUNTER — Ambulatory Visit (HOSPITAL_COMMUNITY)
Admission: RE | Admit: 2016-06-15 | Discharge: 2016-06-15 | Disposition: A | Discharge status: Home / Self Care | Payer: Federal, State, Local not specified - PPO | Source: Ambulatory Visit | Attending: General Surgery | Admitting: General Surgery

## 2016-06-15 DIAGNOSIS — E860 Dehydration: Secondary | ICD-10-CM | POA: Diagnosis not present

## 2016-06-15 LAB — COMPREHENSIVE METABOLIC PANEL
ALK PHOS: 69 U/L (ref 38–126)
ALT: 17 U/L (ref 14–54)
ANION GAP: 7 (ref 5–15)
AST: 25 U/L (ref 15–41)
Albumin: 3.8 g/dL (ref 3.5–5.0)
BILIRUBIN TOTAL: 1.1 mg/dL (ref 0.3–1.2)
BUN: 11 mg/dL (ref 6–20)
CALCIUM: 7.7 mg/dL — AB (ref 8.9–10.3)
CO2: 20 mmol/L — ABNORMAL LOW (ref 22–32)
Chloride: 111 mmol/L (ref 101–111)
Creatinine, Ser: 0.62 mg/dL (ref 0.44–1.00)
Glucose, Bld: 87 mg/dL (ref 65–99)
POTASSIUM: 4 mmol/L (ref 3.5–5.1)
Sodium: 138 mmol/L (ref 135–145)
TOTAL PROTEIN: 6.4 g/dL — AB (ref 6.5–8.1)

## 2016-06-15 MED ORDER — SODIUM CHLORIDE 0.9 % IV BOLUS (SEPSIS)
1000.0000 mL | Freq: Once | INTRAVENOUS | Status: AC
  Administered 2016-06-15 (×2): 1000 mL via INTRAVENOUS

## 2016-06-15 MED ORDER — SODIUM CHLORIDE 0.9 % IV BOLUS (SEPSIS)
2000.0000 mL | Freq: Once | INTRAVENOUS | Status: AC
  Administered 2016-06-15: 1000 mL via INTRAVENOUS

## 2016-06-15 MED ORDER — SODIUM CHLORIDE 0.9 % IV SOLN: INTRAVENOUS | Status: DC

## 2016-06-15 NOTE — Discharge Instructions (Signed)
Dehydration, Adult Dehydration is when there is not enough fluid or water in your body. This happens when you lose more fluids than you take in. Dehydration can range from mild to very bad. It should be treated right away to keep it from getting very bad. Symptoms of mild dehydration may include:   Thirst.  Dry lips.  Slightly dry mouth.  Dry, warm skin.  Dizziness. Symptoms of moderate dehydration may include:   Very dry mouth.  Muscle cramps.  Dark pee (urine). Pee may be the color of tea.  Your body making less pee.  Your eyes making fewer tears.  Heartbeat that is uneven or faster than normal (palpitations).  Headache.  Light-headedness, especially when you stand up from sitting.  Fainting (syncope). Symptoms of very bad dehydration may include:   Changes in skin, such as:  Cold and clammy skin.  Blotchy (mottled) or pale skin.  Skin that does not quickly return to normal after being lightly pinched and let go (poor skin turgor).  Changes in body fluids, such as:  Feeling very thirsty.  Your eyes making fewer tears.  Not sweating when body temperature is high, such as in hot weather.  Your body making very little pee.  Changes in vital signs, such as:  Weak pulse.  Pulse that is more than 100 beats a minute when you are sitting still.  Fast breathing.  Low blood pressure.  Other changes, such as:  Sunken eyes.  Cold hands and feet.  Confusion.  Lack of energy (lethargy).  Trouble waking up from sleep.  Short-term weight loss.  Unconsciousness. Follow these instructions at home:  If told by your doctor, drink an ORS:  Make an ORS by using instructions on the package.  Start by drinking small amounts, about  cup (120 mL) every 5-10 minutes.  Slowly drink more until you have had the amount that your doctor said to have.  Drink enough clear fluid to keep your pee clear or pale yellow. If you were told to drink an ORS, finish the  ORS first, then start slowly drinking clear fluids. Drink fluids such as:  Water. Do not drink only water by itself. Doing that can make the salt (sodium) level in your body get too low (hyponatremia).  Ice chips.  Fruit juice that you have added water to (diluted).  Low-calorie sports drinks.  Avoid:  Alcohol.  Drinks that have a lot of sugar. These include high-calorie sports drinks, fruit juice that does not have water added, and soda.  Caffeine.  Foods that are greasy or have a lot of fat or sugar.  Take over-the-counter and prescription medicines only as told by your doctor.  Do not take salt tablets. Doing that can make the salt level in your body get too high (hypernatremia).  Eat foods that have minerals (electrolytes). Examples include bananas, oranges, potatoes, tomatoes, and spinach.  Keep all follow-up visits as told by your doctor. This is important. Contact a doctor if:  You have belly (abdominal) pain that:  Gets worse.  Stays in one area (localizes).  You have a rash.  You have a stiff neck.  You get angry or annoyed more easily than normal (irritability).  You are more sleepy than normal.  You have a harder time waking up than normal.  You feel:  Weak.  Dizzy.  Very thirsty.  You have peed (urinated) only a small amount of very dark pee during 6-8 hours. Get help right away if:  You   have symptoms of very bad dehydration.  You cannot drink fluids without throwing up (vomiting).  Your symptoms get worse with treatment.  You have a fever.  You have a very bad headache.  You are throwing up or having watery poop (diarrhea) and it:  Gets worse.  Does not go away.  You have blood or something green (bile) in your throw-up.  You have blood in your poop (stool). This may cause poop to look black and tarry.  You have not peed in 6-8 hours.  You pass out (faint).  Your heart rate when you are sitting still is more than 100 beats a  minute.  You have trouble breathing. This information is not intended to replace advice given to you by your health care provider. Make sure you discuss any questions you have with your health care provider. Document Released: 04/24/2009 Document Revised: 01/16/2016 Document Reviewed: 08/22/2015 Elsevier Interactive Patient Education  2017 Elsevier Inc.  

## 2016-06-15 NOTE — Progress Notes (Signed)
Provider: Arta Bruce Kinsinger, MD  Associated Diagnosis:CHL IP SUR GENERAL SURGERY FLUID REPLACEMENT    Procedure: Infusion of 3050ml of 0.9 % normal saline    Post procedure: Patient tolerated infusion well. Started the infusion out at 300 ml per hour each bag and then gradually increased volume as tolerated until all bags infused. Blood drawn at end of infusion for CMET which was sent to the lab. Went over discharge instructions and copy given to patient.  Alert, oriented and ambulatory at time of discharge. Discharged home.

## 2016-06-18 ENCOUNTER — Ambulatory Visit: Payer: Self-pay | Admitting: General Surgery

## 2016-07-13 ENCOUNTER — Encounter: Payer: Federal, State, Local not specified - PPO | Attending: General Surgery | Admitting: Skilled Nursing Facility1

## 2016-07-13 ENCOUNTER — Encounter: Payer: Self-pay | Admitting: Skilled Nursing Facility1

## 2016-07-13 DIAGNOSIS — Z79899 Other long term (current) drug therapy: Secondary | ICD-10-CM | POA: Diagnosis not present

## 2016-07-13 DIAGNOSIS — Z6838 Body mass index (BMI) 38.0-38.9, adult: Secondary | ICD-10-CM | POA: Insufficient documentation

## 2016-07-13 DIAGNOSIS — Z713 Dietary counseling and surveillance: Secondary | ICD-10-CM | POA: Diagnosis not present

## 2016-07-13 DIAGNOSIS — E6609 Other obesity due to excess calories: Secondary | ICD-10-CM

## 2016-07-13 NOTE — Progress Notes (Signed)
Surgery date: 05/18/2016 Surgery type: RYGB Start weight at [facility]: 249 lbs on 02/18/2016, 255.4 on 05/17/16 Weight today: 215.4 lbs  Weight change: 21.6 lbs  TANITA  BODY COMP RESULTS  05/17/16 06/01/16 07/13/2016   BMI (kg/m^2) 39.4 36.6 32.8   Fat Mass (lbs) 129.2 119.2 101.6   Fat Free Mass (lbs) 126.2 117.8 113.8   Total Body Water (lbs) 93.2 86.6 82.8    Follow-up visit:  8 Weeks Post-Operative RYGB Surgery  Medical Nutrition Therapy:  Appt start time: 3:08 end time:  3:38  Primary concerns today: Post-operative Bariatric Surgery Nutrition Management. Pt states she cannot do breakfast yet. Pt states she is done with fish. Pt states he has really bad reflux and started out after her surgery vomiting often so medications were adjusted to help with that. Pt had to visit the rehydration clinic due to the GERD symptoms.Pt states she has been feeling great lately. Pt states she will not start adding vegetables yet due to her just not being ready mentally.  Pt was disappointed by her wt loss because she felt like she should have lost more.   Preferred Learning Style:   No preference indicated   Learning Readiness:   Change in progress  24-hr recall: B (AM): premier protein shake (15 grams) Snk (AM): yogurt (12 grams) L (PM): protein bar (8-12 grams)-----1 egg Snk (PM):  D (PM): chicken (3 ounces 21 grams ) Snk (PM):   Fluid intake: protein shake, poweraid zero, water, stevia drink (at least 64 fluid ounces) Estimated total protein intake: 56 grams of problem   Medications: See List Supplementation: bariatric specific multi, calcium   Using straws: NO Drinking while eating: NO Hair loss: NO Carbonated beverages: NO N/V/D/C: NO, YES due to Severe GERD, NO, NO  Recent physical activity:  eliptical 5 times a week 30-34 minutes with increasing resistance, 2 times a week weights   Handouts given during visit include:  Phase 3 A + NS vegetables    Nutritional Diagnosis:   [place]-3.3 Overweight/obesity related to past poor dietary habits and physical inactivity as evidenced by patient w/ recent RYGB surgery following dietary guidelines for continued weight loss.     Intervention:  Nutrition counseling. Dietitian worked with pt to ensure she was meeting her needs and offered her a resource on GERD. Goals: Add in NS starchy when you are ready  Teaching Method Utilized:  Visual Auditory Hands on Barriers to learning/adherence to lifestyle change: none identified   Demonstrated degree of understanding via:  Teach Back   Monitoring/Evaluation:  Dietary intake, exercise, lap band fills, and body weight. Follow up in 3 months

## 2016-07-14 ENCOUNTER — Other Ambulatory Visit: Payer: Self-pay | Admitting: Internal Medicine

## 2016-07-14 MED ORDER — ALPRAZOLAM 0.25 MG PO TABS: 0.2500 mg | ORAL_TABLET | Freq: Two times a day (BID) | ORAL | 0 refills | Status: DC | PRN

## 2016-07-14 NOTE — Telephone Encounter (Signed)
Last filled 06/11/16--please advise

## 2016-07-14 NOTE — Telephone Encounter (Signed)
Rx called in to pharmacy. 

## 2016-07-14 NOTE — Telephone Encounter (Signed)
Ok to phone in Xanax 

## 2016-08-16 ENCOUNTER — Other Ambulatory Visit: Payer: Self-pay | Admitting: Internal Medicine

## 2016-08-16 MED ORDER — ALPRAZOLAM 0.25 MG PO TABS: 0.2500 mg | ORAL_TABLET | Freq: Two times a day (BID) | ORAL | 0 refills | Status: DC | PRN

## 2016-08-16 NOTE — Telephone Encounter (Signed)
Last filled 07/14/16--please advise

## 2016-08-16 NOTE — Telephone Encounter (Signed)
Rx called in to pharmacy. 

## 2016-08-16 NOTE — Telephone Encounter (Signed)
Ok to phone in Xanax 

## 2016-08-28 ENCOUNTER — Encounter: Payer: Self-pay | Admitting: Internal Medicine

## 2016-08-30 ENCOUNTER — Ambulatory Visit (INDEPENDENT_AMBULATORY_CARE_PROVIDER_SITE_OTHER): Payer: Federal, State, Local not specified - PPO | Admitting: Internal Medicine

## 2016-08-30 ENCOUNTER — Encounter: Payer: Self-pay | Admitting: Internal Medicine

## 2016-08-30 VITALS — BP 110/78 | HR 73 | Temp 98.0°F | Wt 198.0 lb

## 2016-08-30 DIAGNOSIS — B9789 Other viral agents as the cause of diseases classified elsewhere: Secondary | ICD-10-CM

## 2016-08-30 DIAGNOSIS — J069 Acute upper respiratory infection, unspecified: Secondary | ICD-10-CM

## 2016-08-30 DIAGNOSIS — J029 Acute pharyngitis, unspecified: Secondary | ICD-10-CM

## 2016-08-30 LAB — POCT RAPID STREP A (OFFICE): RAPID STREP A SCREEN: NEGATIVE

## 2016-08-30 MED ORDER — METHYLPREDNISOLONE ACETATE 80 MG/ML IJ SUSP
80.0000 mg | Freq: Once | INTRAMUSCULAR | Status: AC
  Administered 2016-08-30: 80 mg via INTRAMUSCULAR

## 2016-08-30 MED ORDER — HYDROCODONE-HOMATROPINE 5-1.5 MG/5ML PO SYRP: 5.0000 mL | ORAL_SOLUTION | Freq: Three times a day (TID) | ORAL | 0 refills | Status: DC | PRN

## 2016-08-30 NOTE — Progress Notes (Signed)
HPI  Pt presents to the clinic today with c/o sore throat, cough and chest congestion. This started 4 days ago. The cough is productive of clear/yellow mucous. She denies runny nose, ear pain or shortness of breath. She denies fever or body aches but has had chills. She has tried Nyquil cough and cold, Alka Seltzer , cough drops and salt water gargles. She has a history of seasonal asthma. She has not had to use her Albuterol inhaler. She has had sick contacts. Her flu shot is UTD.  Review of Systems        Past Medical History:  Diagnosis Date  . Anginal pain ([place])   . Anxiety   . Asthma   . Bleeding    POST PARTUM BLEEDING  . Bronchitis with flu 06/12/11  . GERD (gastroesophageal reflux disease)   . Heart murmur    while pregnant  . Migraine   . PONV (postoperative nausea and vomiting)     Family History  Problem Relation Age of Onset  . Hypertension Father   . Diabetes Father   . Hypertension Mother   . Cancer Mother     uterine  . Stroke Mother   . Diabetes Maternal Aunt   . Diabetes Maternal Uncle   . Cancer Maternal Grandmother     PANCREATIC  . Hypertension Maternal Grandmother   . Diabetes Maternal Grandmother   . Cancer Maternal Grandfather     LUNG  . Hypertension Maternal Grandfather   . Diabetes Maternal Grandfather   . Cancer Paternal Grandmother     THROAT  . Alzheimer's disease Paternal Grandmother     Social History   Social History  . Marital status: Divorced    Spouse name: N/A  . Number of children: N/A  . Years of education: N/A   Occupational History  . Not on file.   Social History Main Topics  . Smoking status: Never Smoker  . Smokeless tobacco: Never Used  . Alcohol use 0.6 oz/week    1 Standard drinks or equivalent per week     Comment: occasional (not much)  . Drug use: No  . Sexual activity: Not on file   Other Topics Concern  . Not on file   Social History Narrative  . No narrative on file    Allergies  Allergen  Reactions  . Bacitra-Neomycin-Polymyxin-Hc Other (See Comments)    Pain and swelling of ear canal  . Hydrocodone-Acetaminophen Rash     Constitutional: Denies headache, fatigue, fever or abrupt weight changes.  HEENT:  Positive sore throat. Denies eye redness, eye pain, pressure behind the eyes, facial pain, nasal congestion, ear pain, ringing in the ears, wax buildup, runny nose or bloody nose. Respiratory: Positive cough. Denies difficulty breathing or shortness of breath.  Cardiovascular: Denies chest pain, chest tightness, palpitations or swelling in the hands or feet.   No other specific complaints in a complete review of systems (except as listed in HPI above).  Objective:   BP 110/78   Pulse 73   Temp 98 F (36.7 C) (Oral)   Wt 198 lb (89.8 kg)   LMP 07/12/2011   SpO2 98%   BMI 30.11 kg/m  Wt Readings from Last 3 Encounters:  08/30/16 198 lb (89.8 kg)  07/13/16 215 lb 6.4 oz (97.7 kg)  06/05/16 237 lb (107.5 kg)     General: Appears her stated age, in NAD. HEENT: Head: normal shape and size , no sinus tenderness noted;  Ears: t-tubes bilaterally;  Throat/Mouth: + PND. Teeth present, mucosa pink and moist, no exudate noted, no lesions or ulcerations noted.  Neck: No cervical lymphadenopathy.  Cardiovascular: Normal rate and rhythm. S1,S2 noted.  No murmur, rubs or gallops noted.  Pulmonary/Chest: Normal effort and positive vesicular breath sounds. No respiratory distress. No wheezes, rales or ronchi noted.       Assessment & Plan:   Viral Upper Respiratory Infection with Cough:  RST: negative Get some rest and drink plenty of water 80 mg Depo IM today Do salt water gargles for the sore throat No indication for abx at this time Rx for Hycodan cough syrup  RTC as needed or if symptoms persist.   Webb Silversmith, NP

## 2016-08-30 NOTE — Addendum Note (Signed)
Addended by: Lurlean Nanny on: 08/30/2016 11:32 AM   Modules accepted: Orders

## 2016-08-30 NOTE — Patient Instructions (Signed)
Sore Throat When you have a sore throat, your throat may:  Hurt.  Burn.  Feel irritated.  Feel scratchy. Many things can cause a sore throat, including:  An infection.  Allergies.  Dryness in the air.  Smoke or pollution.  Gastroesophageal reflux disease (GERD).  A tumor. A sore throat can be the first sign of another sickness. It can happen with other problems, like coughing or a fever. Most sore throats go away without treatment. Follow these instructions at home:  Take over-the-counter medicines only as told by your doctor.  Drink enough fluids to keep your pee (urine) clear or pale yellow.  Rest when you feel you need to.  To help with pain, try:  Sipping warm liquids, such as broth, herbal tea, or warm water.  Eating or drinking cold or frozen liquids, such as frozen ice pops.  Gargling with a salt-water mixture 3-4 times a day or as needed. To make a salt-water mixture, add -1 tsp of salt in 1 cup of warm water. Mix it until you cannot see the salt anymore.  Sucking on hard candy or throat lozenges.  Putting a cool-mist humidifier in your bedroom at night.  Sitting in the bathroom with the door closed for 5-10 minutes while you run hot water in the shower.  Do not use any tobacco products, such as cigarettes, chewing tobacco, and e-cigarettes. If you need help quitting, ask your doctor. Contact a doctor if:  You have a fever for more than 2-3 days.  You keep having symptoms for more than 2-3 days.  Your throat does not get better in 7 days.  You have a fever and your symptoms suddenly get worse. Get help right away if:  You have trouble breathing.  You cannot swallow fluids, soft foods, or your saliva.  You have swelling in your throat or neck that gets worse.  You keep feeling like you are going to throw up (vomit).  You keep throwing up. This information is not intended to replace advice given to you by your health care provider. Make sure  you discuss any questions you have with your health care provider. Document Released: 04/06/2008 Document Revised: 02/22/2016 Document Reviewed: 04/18/2015 Elsevier Interactive Patient Education  2017 Elsevier Inc.  

## 2016-09-01 MED ORDER — AZITHROMYCIN 250 MG PO TABS: ORAL_TABLET | ORAL | 0 refills | Status: DC

## 2016-09-01 MED ORDER — FLUCONAZOLE 150 MG PO TABS: 150.0000 mg | ORAL_TABLET | Freq: Once | ORAL | 0 refills | Status: AC

## 2016-09-01 NOTE — Addendum Note (Signed)
Addended by: Jearld Fenton on: 09/01/2016 12:26 PM   Modules accepted: Orders

## 2016-09-14 ENCOUNTER — Other Ambulatory Visit: Payer: Self-pay | Admitting: Internal Medicine

## 2016-09-14 MED ORDER — ALPRAZOLAM 0.25 MG PO TABS: 0.2500 mg | ORAL_TABLET | Freq: Two times a day (BID) | ORAL | 0 refills | Status: DC | PRN

## 2016-09-14 NOTE — Telephone Encounter (Signed)
Ok to phone in Xanax 

## 2016-09-14 NOTE — Telephone Encounter (Signed)
Last filled 08/16/16---last UDS 9/17 to be done annually---please advise

## 2016-09-14 NOTE — Telephone Encounter (Signed)
Rx called in to pharmacy. 

## 2016-10-13 ENCOUNTER — Other Ambulatory Visit: Payer: Self-pay | Admitting: Internal Medicine

## 2016-10-13 ENCOUNTER — Ambulatory Visit: Payer: Federal, State, Local not specified - PPO | Admitting: Skilled Nursing Facility1

## 2016-10-13 NOTE — Telephone Encounter (Signed)
Last Rx 09/14/2016. Last OV 08/2016-acute

## 2016-10-14 MED ORDER — ALPRAZOLAM 0.25 MG PO TABS: 0.2500 mg | ORAL_TABLET | Freq: Two times a day (BID) | ORAL | 0 refills | Status: DC | PRN

## 2016-10-14 NOTE — Telephone Encounter (Signed)
Rx called in to requested pharmacy 

## 2016-10-14 NOTE — Telephone Encounter (Signed)
Ok to phone in Xanax 

## 2016-10-19 DIAGNOSIS — H6983 Other specified disorders of Eustachian tube, bilateral: Secondary | ICD-10-CM | POA: Diagnosis not present

## 2016-10-19 DIAGNOSIS — H66002 Acute suppurative otitis media without spontaneous rupture of ear drum, left ear: Secondary | ICD-10-CM | POA: Diagnosis not present

## 2016-10-19 DIAGNOSIS — H60332 Swimmer's ear, left ear: Secondary | ICD-10-CM | POA: Diagnosis not present

## 2016-11-09 ENCOUNTER — Other Ambulatory Visit: Payer: Self-pay | Admitting: Internal Medicine

## 2016-11-09 MED ORDER — ALPRAZOLAM 0.25 MG PO TABS: 0.2500 mg | ORAL_TABLET | Freq: Two times a day (BID) | ORAL | 0 refills | Status: DC | PRN

## 2016-11-09 NOTE — Telephone Encounter (Signed)
Ok to phone in Xanax to be filled on or after 5/4

## 2016-11-09 NOTE — Telephone Encounter (Signed)
Last filled 10/14/16-- please advise

## 2016-11-10 ENCOUNTER — Other Ambulatory Visit: Payer: Self-pay | Admitting: Internal Medicine

## 2016-11-11 NOTE — Telephone Encounter (Signed)
Rx called in to pharmacy. 

## 2016-11-22 ENCOUNTER — Ambulatory Visit: Payer: Federal, State, Local not specified - PPO | Admitting: Neurology

## 2016-11-22 DIAGNOSIS — Z029 Encounter for administrative examinations, unspecified: Secondary | ICD-10-CM

## 2016-11-23 ENCOUNTER — Encounter: Payer: Self-pay | Admitting: Neurology

## 2016-12-09 ENCOUNTER — Encounter: Payer: Self-pay | Admitting: Internal Medicine

## 2016-12-13 ENCOUNTER — Other Ambulatory Visit: Payer: Self-pay | Admitting: Internal Medicine

## 2016-12-13 NOTE — Telephone Encounter (Signed)
Last filled 11/09/2016... Please advise

## 2016-12-14 MED ORDER — ALPRAZOLAM 0.25 MG PO TABS: 0.2500 mg | ORAL_TABLET | Freq: Two times a day (BID) | ORAL | 0 refills | Status: DC | PRN

## 2016-12-14 NOTE — Telephone Encounter (Signed)
Rx called in to pharmacy. 

## 2016-12-14 NOTE — Telephone Encounter (Signed)
Ok to phone in Xanax 

## 2017-01-03 ENCOUNTER — Encounter: Payer: Self-pay | Admitting: Internal Medicine

## 2017-01-03 ENCOUNTER — Ambulatory Visit (INDEPENDENT_AMBULATORY_CARE_PROVIDER_SITE_OTHER): Payer: Federal, State, Local not specified - PPO | Admitting: Internal Medicine

## 2017-01-03 VITALS — BP 108/72 | HR 68 | Temp 98.6°F | Ht 67.75 in | Wt 172.0 lb

## 2017-01-03 DIAGNOSIS — G43009 Migraine without aura, not intractable, without status migrainosus: Secondary | ICD-10-CM | POA: Diagnosis not present

## 2017-01-03 DIAGNOSIS — J301 Allergic rhinitis due to pollen: Secondary | ICD-10-CM | POA: Diagnosis not present

## 2017-01-03 DIAGNOSIS — J302 Other seasonal allergic rhinitis: Secondary | ICD-10-CM | POA: Insufficient documentation

## 2017-01-03 DIAGNOSIS — F419 Anxiety disorder, unspecified: Secondary | ICD-10-CM | POA: Diagnosis not present

## 2017-01-03 DIAGNOSIS — K219 Gastro-esophageal reflux disease without esophagitis: Secondary | ICD-10-CM | POA: Diagnosis not present

## 2017-01-03 DIAGNOSIS — Z0000 Encounter for general adult medical examination without abnormal findings: Secondary | ICD-10-CM | POA: Diagnosis not present

## 2017-01-03 LAB — COMPREHENSIVE METABOLIC PANEL
ALK PHOS: 96 U/L (ref 39–117)
ALT: 10 U/L (ref 0–35)
AST: 14 U/L (ref 0–37)
Albumin: 4.3 g/dL (ref 3.5–5.2)
BILIRUBIN TOTAL: 0.6 mg/dL (ref 0.2–1.2)
BUN: 10 mg/dL (ref 6–23)
CALCIUM: 9.4 mg/dL (ref 8.4–10.5)
CO2: 29 mEq/L (ref 19–32)
Chloride: 102 mEq/L (ref 96–112)
Creatinine, Ser: 0.74 mg/dL (ref 0.40–1.20)
GFR: 112.27 mL/min (ref >60.00)
Glucose, Bld: 90 mg/dL (ref 70–99)
Potassium: 4.5 mEq/L (ref 3.5–5.1)
Sodium: 137 mEq/L (ref 135–145)
TOTAL PROTEIN: 6.9 g/dL (ref 6.0–8.3)

## 2017-01-03 LAB — CBC
HCT: 39.4 % (ref 36.0–46.0)
HEMOGLOBIN: 13.4 g/dL (ref 12.0–15.0)
MCHC: 33.9 g/dL (ref 30.0–36.0)
MCV: 91.3 fl (ref 78.0–100.0)
PLATELETS: 241 10*3/uL (ref 150.0–400.0)
RBC: 4.31 Mil/uL (ref 3.87–5.11)
RDW: 12.8 % (ref 11.5–15.5)
WBC: 8.2 10*3/uL (ref 4.0–10.5)

## 2017-01-03 LAB — LIPID PANEL
Cholesterol: 175 mg/dL (ref 0–200)
HDL: 65.1 mg/dL (ref >39.00)
LDL Cholesterol: 86 mg/dL (ref 0–99)
NONHDL: 109.56
TRIGLYCERIDES: 116 mg/dL (ref 0.0–149.0)
Total CHOL/HDL Ratio: 3
VLDL: 23.2 mg/dL (ref 0.0–40.0)

## 2017-01-03 MED ORDER — PANTOPRAZOLE SODIUM 40 MG PO TBEC: DELAYED_RELEASE_TABLET | ORAL | 11 refills | Status: DC

## 2017-01-03 NOTE — Assessment & Plan Note (Signed)
Protonix refilled today 

## 2017-01-03 NOTE — Assessment & Plan Note (Signed)
Try taking the Zyrtec and Flonase daily

## 2017-01-03 NOTE — Patient Instructions (Signed)

## 2017-01-03 NOTE — Assessment & Plan Note (Signed)
Currently not an issue Will monitor 

## 2017-01-03 NOTE — Progress Notes (Signed)
Subjective:    Patient ID: Linda [name], female    DOB: 06-May-1976, 41 y.o.   MRN: [id number]  HPI  Pt presents to the clinic today for her annual exam. She is also due to follow up chronic conditions.  Anxiety: She is not sure what is triggering this. She takes Xanax 3 x week.  GERD: Triggered by red meat, spicy food, greasy foods, tea. She is taking Protonix daily and denies breakthrough symptoms. She would like a refill of this today.   Migraines: She is no longer having migraines. She no longer takes Zonegran.  Seasonal Allergies: Seems to be occurring all year long. She takes Zyrtec and Flonase intermittently but not consistently.   Flu: 04/2016 Tetanus: 08/2015 Pap Smear: 04/2011, hysterectomy Dentist: annually  Diet: She eats lean meats. She consumes fruits and veggies daily. She tries to avoid fried foods. She drinks mostly water, unsweet tea. Exercise: She goes to the gym 4 x week.   Review of Systems      Past Medical History:  Diagnosis Date  . Anginal pain ([place])   . Anxiety   . Asthma   . Bleeding    POST PARTUM BLEEDING  . Bronchitis with flu 06/12/11  . GERD (gastroesophageal reflux disease)   . Heart murmur    while pregnant  . Migraine   . PONV (postoperative nausea and vomiting)     Current Outpatient Prescriptions  Medication Sig Dispense Refill  . ALPRAZolam (XANAX) 0.25 MG tablet Take 1 tablet (0.25 mg total) by mouth 2 (two) times daily as needed. 20 tablet 0  . Calcium Citrate-Vitamin D (CALCIUM CITRATE CHEWY BITE) 500-500 MG-UNIT CHEW Chew 1 each by mouth 4 (four) times daily.    . diphenhydrAMINE (BENADRYL) 25 MG tablet Take 1 tablet (25 mg total) by mouth every 4 (four) hours as needed for itching. 20 tablet 0  . naproxen sodium (ANAPROX) 220 MG tablet Take 440 mg by mouth 2 (two) times daily as needed (for pain).    Marland Kitchen OVER THE COUNTER MEDICATION Take 1 tablet by mouth 2 (two) times daily.    . pantoprazole (PROTONIX) 40 MG tablet TAKE 1  TABLET (40 MG TOTAL) BY MOUTH DAILY. 30 tablet 8  . VENTOLIN HFA 108 (90 Base) MCG/ACT inhaler INHALE 2 PUFFS INTO THE LUNGS EVERY 6 (SIX) HOURS AS NEEDED FOR WHEEZING OR SHORTNESS OF BREATH. 18 Inhaler 0  . zonisamide (ZONEGRAN) 100 MG capsule Take 2 capsules (200 mg total) by mouth daily. 60 capsule 11   No current facility-administered medications for this visit.     Allergies  Allergen Reactions  . Bacitra-Neomycin-Polymyxin-Hc Other (See Comments)    Pain and swelling of ear canal  . Hydrocodone-Acetaminophen Rash    Family History  Problem Relation Age of Onset  . Hypertension Father   . Diabetes Father   . Hypertension Mother   . Cancer Mother        uterine  . Stroke Mother   . Diabetes Maternal Aunt   . Diabetes Maternal Uncle   . Cancer Maternal Grandmother        PANCREATIC  . Hypertension Maternal Grandmother   . Diabetes Maternal Grandmother   . Cancer Maternal Grandfather        LUNG  . Hypertension Maternal Grandfather   . Diabetes Maternal Grandfather   . Cancer Paternal Grandmother        THROAT  . Alzheimer's disease Paternal Grandmother     Social History  Social History  . Marital status: Divorced    Spouse name: N/A  . Number of children: N/A  . Years of education: N/A   Occupational History  . Not on file.   Social History Main Topics  . Smoking status: Never Smoker  . Smokeless tobacco: Never Used  . Alcohol use 0.6 oz/week    1 Standard drinks or equivalent per week     Comment: occasional (not much)  . Drug use: No  . Sexual activity: Not on file   Other Topics Concern  . Not on file   Social History Narrative  . No narrative on file     Constitutional: Denies fever, malaise, fatigue, headache or abrupt weight changes.  HEENT: Pt reports nasal congestion. Denies eye pain, eye redness, ear pain, ringing in the ears, wax buildup, runny nose, bloody nose, or sore throat. Respiratory: Denies difficulty breathing, shortness of  breath, cough or sputum production.   Cardiovascular: Denies chest pain, chest tightness, palpitations or swelling in the hands or feet.  Gastrointestinal: Pt reports reflux. Denies abdominal pain, bloating, constipation, diarrhea or blood in the stool.  GU: Denies urgency, frequency, pain with urination, burning sensation, blood in urine, odor or discharge. Musculoskeletal: Denies decrease in range of motion, difficulty with gait, muscle pain or joint pain and swelling.  Skin: Denies redness, rashes, lesions or ulcercations.  Neurological: Denies dizziness, difficulty with memory, difficulty with speech or problems with balance and coordination.  Psych: Pt reports anxiety. Denies depression, SI/HI.  No other specific complaints in a complete review of systems (except as listed in HPI above).  Objective:   Physical Exam  BP 108/72   Pulse 68   Temp 98.6 F (37 C) (Oral)   Ht 5' 7.75" (1.721 m)   Wt 172 lb (78 kg)   LMP 07/12/2011   SpO2 98%   BMI 26.35 kg/m  Wt Readings from Last 3 Encounters:  01/03/17 172 lb (78 kg)  08/30/16 198 lb (89.8 kg)  07/13/16 215 lb 6.4 oz (97.7 kg)    General: Appears her stated age, well developed, well nourished in NAD. Skin: Warm, dry and intact. HEENT: Head: normal shape and size; Eyes: sclera white, no icterus, conjunctiva pink, PERRLA and EOMs intact; Ears: Tm's gray and intact, normal light reflex; Throat/Mouth: Teeth present, mucosa pink and moist, no exudate, lesions or ulcerations noted.  Neck:  Neck supple, trachea midline. No masses, lumps or thyromegaly present.  Cardiovascular: Normal rate and rhythm. S1,S2 noted.  No murmur, rubs or gallops noted. No JVD or BLE edema.  Pulmonary/Chest: Normal effort and positive vesicular breath sounds. No respiratory distress. No wheezes, rales or ronchi noted.  Abdomen: Soft and nontender. Normal bowel sounds. No distention or masses noted. Liver, spleen and kidneys non palpable. Musculoskeletal:  Strength 5/5 BUE/BLE.  No difficulty with gait.  Neurological: Alert and oriented. Cranial nerves II-XII grossly intact. Coordination normal.  Psychiatric: Mood and affect normal. Behavior is normal. Judgment and thought content normal.    BMET    Component Value Date/Time   NA 138 06/15/2016 1607   NA 137 05/31/2013 0846   K 4.0 06/15/2016 1607   K 3.8 05/31/2013 0846   CL 111 06/15/2016 1607   CL 106 05/31/2013 0846   CO2 20 (L) 06/15/2016 1607   CO2 26 05/31/2013 0846   GLUCOSE 87 06/15/2016 1607   GLUCOSE 93 05/31/2013 0846   BUN 11 06/15/2016 1607   BUN 10 05/31/2013 0846   CREATININE 0.62  06/15/2016 1607   CREATININE 0.76 05/31/2013 0846   CALCIUM 7.7 (L) 06/15/2016 1607   CALCIUM 8.2 (L) 05/31/2013 0846   GFRNONAA >60 06/15/2016 1607   GFRNONAA >60 05/31/2013 0846   GFRAA >60 06/15/2016 1607   GFRAA >60 05/31/2013 0846    Lipid Panel     Component Value Date/Time   CHOL 227 (H) 09/02/2015 1107   TRIG 245.0 (H) 09/02/2015 1107   HDL 51.10 09/02/2015 1107   CHOLHDL 4 09/02/2015 1107   VLDL 49.0 (H) 09/02/2015 1107    CBC    Component Value Date/Time   WBC 9.0 05/20/2016 0423   RBC 3.87 05/20/2016 0423   HGB 11.3 (L) 05/20/2016 0423   HGB 13.3 05/31/2013 0846   HCT 34.6 (L) 05/20/2016 0423   HCT 39.2 05/31/2013 0846   PLT 245 05/20/2016 0423   PLT 219 05/31/2013 0846   MCV 89.4 05/20/2016 0423   MCV 88 05/31/2013 0846   MCH 29.2 05/20/2016 0423   MCHC 32.7 05/20/2016 0423   RDW 13.2 05/20/2016 0423   RDW 12.8 05/31/2013 0846   LYMPHSABS 1.9 05/20/2016 0423   LYMPHSABS 2.1 05/31/2013 0846   MONOABS 0.6 05/20/2016 0423   MONOABS 0.4 05/31/2013 0846   EOSABS 0.1 05/20/2016 0423   EOSABS 0.1 05/31/2013 0846   BASOSABS 0.0 05/20/2016 0423   BASOSABS 0.1 05/31/2013 0846    Hgb A1C Lab Results  Component Value Date   HGBA1C 6.1 09/02/2015            Assessment & Plan:   Preventative Health Maintenance:  Encouraged her to get a flu shot  in the fall Tetanus UTD She declines pelvic exam Will do a mammogram next year Encouraged her to consume a balanced diet and exercise regimen Advised her to see an eye doctor and dentist annually Will check CBC, CMET, Lipid profile today  RTC in 1 year, sooner if needed Webb Silversmith, NP

## 2017-01-03 NOTE — Assessment & Plan Note (Signed)
She declines rx for daily SSRI Continue Xanax prn

## 2017-01-17 ENCOUNTER — Other Ambulatory Visit: Payer: Self-pay | Admitting: Internal Medicine

## 2017-01-17 NOTE — Telephone Encounter (Signed)
Last filled 12/14/16... Please advise

## 2017-01-18 MED ORDER — ALPRAZOLAM 0.25 MG PO TABS: 0.2500 mg | ORAL_TABLET | Freq: Two times a day (BID) | ORAL | 0 refills | Status: DC | PRN

## 2017-01-18 NOTE — Telephone Encounter (Signed)
Ok to phone in Xanax 

## 2017-01-18 NOTE — Telephone Encounter (Signed)
Rx called in to pharmacy. 

## 2017-02-15 ENCOUNTER — Other Ambulatory Visit: Payer: Self-pay | Admitting: Internal Medicine

## 2017-02-15 NOTE — Telephone Encounter (Signed)
Last filled 01/18/2017... Please advise

## 2017-02-17 MED ORDER — ALPRAZOLAM 0.25 MG PO TABS: 0.2500 mg | ORAL_TABLET | Freq: Two times a day (BID) | ORAL | 0 refills | Status: DC | PRN

## 2017-02-17 NOTE — Telephone Encounter (Signed)
Ok to phone in Xanax 

## 2017-02-18 NOTE — Telephone Encounter (Signed)
Rx called in to pharmacy. 

## 2017-03-15 ENCOUNTER — Telehealth: Payer: Self-pay | Admitting: Internal Medicine

## 2017-03-15 ENCOUNTER — Encounter: Payer: Self-pay | Admitting: Internal Medicine

## 2017-03-15 NOTE — Telephone Encounter (Signed)
Last filled 02/17/17... Please advise

## 2017-03-16 MED ORDER — ALPRAZOLAM 0.25 MG PO TABS: 0.2500 mg | ORAL_TABLET | Freq: Two times a day (BID) | ORAL | 0 refills | Status: DC | PRN

## 2017-03-16 NOTE — Telephone Encounter (Signed)
Ok to phone in Xanax to be filled on or after 9/9

## 2017-03-17 ENCOUNTER — Other Ambulatory Visit: Payer: Self-pay | Admitting: Internal Medicine

## 2017-03-17 ENCOUNTER — Telehealth: Payer: Federal, State, Local not specified - PPO | Admitting: Family

## 2017-03-17 DIAGNOSIS — B3731 Acute candidiasis of vulva and vagina: Secondary | ICD-10-CM

## 2017-03-17 DIAGNOSIS — B373 Candidiasis of vulva and vagina: Secondary | ICD-10-CM

## 2017-03-17 MED ORDER — FLUCONAZOLE 150 MG PO TABS: 150.0000 mg | ORAL_TABLET | Freq: Once | ORAL | 0 refills | Status: DC

## 2017-03-17 NOTE — Progress Notes (Signed)
Thank you for the details you put in the comment boxes. Those details really help [place] take better care of you.   We are sorry that you are not feeling well. Here is how we plan to help! Based on what you shared with me it looks like you: May have a yeast vaginosis  Vaginosis is an inflammation of the vagina that can result in discharge, itching and pain. The cause is usually a change in the normal balance of vaginal bacteria or an infection. Vaginosis can also result from reduced estrogen levels after menopause.  The most common causes of vaginosis are:   Bacterial vaginosis which results from an overgrowth of one on several organisms that are normally present in your vagina.   Yeast infections which are caused by a naturally occurring fungus called candida.   Vaginal atrophy (atrophic vaginosis) which results from the thinning of the vagina from reduced estrogen levels after menopause.   Trichomoniasis which is caused by a parasite and is commonly transmitted by sexual intercourse.  Factors that increase your risk of developing vaginosis include: Marland Kitchen Medications, such as antibiotics and steroids . Uncontrolled diabetes . Use of hygiene products such as bubble bath, vaginal spray or vaginal deodorant . Douching . Wearing damp or tight-fitting clothing . Using an intrauterine device (IUD) for birth control . Hormonal changes, such as those associated with pregnancy, birth control pills or menopause . Sexual activity . Having a sexually transmitted infection  Your treatment plan is A single Diflucan (fluconazole) 150mg  tablet once.  I have electronically sent this prescription into the pharmacy that you have chosen.  Be sure to take all of the medication as directed. Stop taking any medication if you develop a rash, tongue swelling or shortness of breath. Mothers who are breast feeding should consider pumping and discarding their breast milk while on these antibiotics. However, there is no  consensus that infant exposure at these doses would be harmful.  Remember that medication creams can weaken latex condoms. Marland Kitchen   HOME CARE:  Good hygiene may prevent some types of vaginosis from recurring and may relieve some symptoms:  . Avoid baths, hot tubs and whirlpool spas. Rinse soap from your outer genital area after a shower, and dry the area well to prevent irritation. Don't use scented or harsh soaps, such as those with deodorant or antibacterial action. Marland Kitchen Avoid irritants. These include scented tampons and pads. . Wipe from front to back after using the toilet. Doing so avoids spreading fecal bacteria to your vagina.  Other things that may help prevent vaginosis include:  Marland Kitchen Don't douche. Your vagina doesn't require cleansing other than normal bathing. Repetitive douching disrupts the normal organisms that reside in the vagina and can actually increase your risk of vaginal infection. Douching won't clear up a vaginal infection. . Use a latex condom. Both female and female latex condoms may help you avoid infections spread by sexual contact. . Wear cotton underwear. Also wear pantyhose with a cotton crotch. If you feel comfortable without it, skip wearing underwear to bed. Yeast thrives in [employer] Your symptoms should improve in the next day or two.  GET HELP RIGHT AWAY IF:  . You have pain in your lower abdomen ( pelvic area or over your ovaries) . You develop nausea or vomiting . You develop a fever . Your discharge changes or worsens . You have persistent pain with intercourse . You develop shortness of breath, a rapid pulse, or you faint.  These symptoms  could be signs of problems or infections that need to be evaluated by a medical provider now.  MAKE SURE YOU    Understand these instructions.  Will watch your condition.  Will get help right away if you are not doing well or get worse.  Your e-visit answers were reviewed by a board certified advanced  clinical practitioner to complete your personal care plan. Depending upon the condition, your plan could have included both over the counter or prescription medications. Please review your pharmacy choice to make sure that you have choses a pharmacy that is open for you to pick up any needed prescription, Your safety is important to [place]. If you have drug allergies check your prescription carefully.   You can use MyChart to ask questions about today's visit, request a non-urgent call back, or ask for a work or school excuse for 24 hours related to this e-Visit. If it has been greater than 24 hours you will need to follow up with your provider, or enter a new e-Visit to address those concerns. You will get a MyChart message within the next two days asking about your experience. I hope that your e-visit has been valuable and will speed your recovery.

## 2017-03-18 ENCOUNTER — Other Ambulatory Visit: Payer: Self-pay | Admitting: Internal Medicine

## 2017-03-18 MED ORDER — FLUCONAZOLE 150 MG PO TABS: 150.0000 mg | ORAL_TABLET | Freq: Once | ORAL | 0 refills | Status: DC

## 2017-03-18 MED ORDER — FLUCONAZOLE 150 MG PO TABS: 150.0000 mg | ORAL_TABLET | Freq: Once | ORAL | 0 refills | Status: AC

## 2017-03-18 MED ORDER — ALPRAZOLAM 0.25 MG PO TABS: 0.2500 mg | ORAL_TABLET | Freq: Two times a day (BID) | ORAL | 0 refills | Status: DC | PRN

## 2017-03-18 NOTE — Addendum Note (Signed)
Addended by: Benjamine Mola on: 03/18/2017 08:45 AM   Modules accepted: Orders

## 2017-03-18 NOTE — Telephone Encounter (Signed)
Rx called in to pharmacy. 

## 2017-03-22 NOTE — Telephone Encounter (Signed)
Linda [name] with [facility] left v/m requesting refills for alprazolam and fluconazole represcribed to [facility]; Linda [name] [facility] [place]. Please advise.

## 2017-04-21 ENCOUNTER — Other Ambulatory Visit: Payer: Self-pay | Admitting: Internal Medicine

## 2017-04-21 NOTE — Telephone Encounter (Signed)
Last office visit 01/03/2017.  Last refilled 03/18/2017 for #20 with no refills.  Ok to refill?

## 2017-04-22 MED ORDER — ALPRAZOLAM 0.25 MG PO TABS: 0.2500 mg | ORAL_TABLET | Freq: Two times a day (BID) | ORAL | 0 refills | Status: DC | PRN

## 2017-04-22 NOTE — Telephone Encounter (Signed)
Alprazolam called into [id number] - [place], [place] - [address] Phone: [phone] 

## 2017-04-22 NOTE — Telephone Encounter (Signed)
Ok to phone in Xanax. Advise her, I think it is time we try to come off the Xanax. I would like her to take it as rarely as possible.

## 2017-04-28 ENCOUNTER — Ambulatory Visit: Payer: Federal, State, Local not specified - PPO | Admitting: Family Medicine

## 2017-04-28 ENCOUNTER — Encounter: Payer: Self-pay | Admitting: Internal Medicine

## 2017-04-29 ENCOUNTER — Ambulatory Visit (INDEPENDENT_AMBULATORY_CARE_PROVIDER_SITE_OTHER): Payer: Federal, State, Local not specified - PPO | Admitting: Internal Medicine

## 2017-04-29 ENCOUNTER — Encounter: Payer: Self-pay | Admitting: Internal Medicine

## 2017-04-29 VITALS — BP 130/84 | HR 59 | Temp 97.9°F | Ht 67.75 in | Wt 173.0 lb

## 2017-04-29 DIAGNOSIS — L03011 Cellulitis of right finger: Secondary | ICD-10-CM | POA: Insufficient documentation

## 2017-04-29 DIAGNOSIS — Z23 Encounter for immunization: Secondary | ICD-10-CM | POA: Diagnosis not present

## 2017-04-29 MED ORDER — CEPHALEXIN 500 MG PO CAPS: 500.0000 mg | ORAL_CAPSULE | Freq: Three times a day (TID) | ORAL | 0 refills | Status: DC

## 2017-04-29 NOTE — Progress Notes (Signed)
   Subjective:    Patient ID: Linda [name], female    DOB: 12-Jun-1976, 41 y.o.   MRN: [id number]  HPI The patient is a 41 YO female coming in for right middle finger infection. She was seen at urgent med 1 day ago and they started bactrim. She has taken 3 doses and feels it is worsening. Started about 1 week ago when she got a manicure at a different place. Started swelling and redness. Very sore. She is using warm compresses on it which are not helping.   Review of Systems  Constitutional: Negative.   HENT: Negative.   Eyes: Negative.   Respiratory: Negative for cough, chest tightness and shortness of breath.   Cardiovascular: Negative for chest pain, palpitations and leg swelling.  Gastrointestinal: Negative for abdominal distention, abdominal pain, constipation, diarrhea, nausea and vomiting.  Musculoskeletal: Positive for arthralgias, joint swelling and myalgias.  Skin: Positive for color change and wound.      Objective:   Physical Exam  Constitutional: She is oriented to person, place, and time. She appears well-developed and well-nourished.  HENT:  Head: Normocephalic and atraumatic.  Eyes: EOM are normal.  Neck: Normal range of motion.  Cardiovascular: Normal rate and regular rhythm.   Pulmonary/Chest: Effort normal and breath sounds normal. No respiratory distress. She has no wheezes. She has no rales.  Abdominal: Soft. Bowel sounds are normal. She exhibits no distension. There is no tenderness. There is no rebound.  Musculoskeletal: She exhibits no edema.  Neurological: She is alert and oriented to person, place, and time. Coordination normal.  Skin: Skin is warm and dry.  Right middle finger with swollen cuticle and very tender to palpation. No pus could be expressed as the patient was not able to tolerate palpation.   Psychiatric: She has a normal mood and affect.   Vitals:   04/29/17 1104  BP: 130/84  Pulse: (!) 59  Temp: 97.9 F (36.6 C)  TempSrc: Oral  SpO2:  100%  Weight: 173 lb (78.5 kg)  Height: 5' 7.75" (1.721 m)      Assessment & Plan:

## 2017-04-29 NOTE — Assessment & Plan Note (Addendum)
Likely related to the manicure. She will take keflex. Advised that it will take 2-3 days. Offered I and D and she did not want this due to exquisitely tender. Warm compress and tylenol for pain.

## 2017-04-29 NOTE — Patient Instructions (Addendum)
Keep taking the bactrim until Monday.   We have sent in keflex to take 1 pill three times per day for 1 week starting today.  Give this 2-3 days and if no better it can be drained. Use warm compresses on it

## 2017-05-02 ENCOUNTER — Emergency Department
Admission: EM | Admit: 2017-05-02 | Discharge: 2017-05-02 | Disposition: A | Discharge status: Home / Self Care | Payer: Federal, State, Local not specified - PPO | Attending: Emergency Medicine | Admitting: Emergency Medicine

## 2017-05-02 ENCOUNTER — Ambulatory Visit: Payer: Self-pay | Admitting: *Deleted

## 2017-05-02 ENCOUNTER — Encounter: Payer: Self-pay | Admitting: Emergency Medicine

## 2017-05-02 ENCOUNTER — Emergency Department: Payer: Federal, State, Local not specified - PPO

## 2017-05-02 ENCOUNTER — Ambulatory Visit: Payer: Federal, State, Local not specified - PPO | Admitting: Internal Medicine

## 2017-05-02 DIAGNOSIS — L03011 Cellulitis of right finger: Secondary | ICD-10-CM | POA: Diagnosis not present

## 2017-05-02 DIAGNOSIS — R600 Localized edema: Secondary | ICD-10-CM | POA: Diagnosis not present

## 2017-05-02 DIAGNOSIS — J45909 Unspecified asthma, uncomplicated: Secondary | ICD-10-CM | POA: Diagnosis not present

## 2017-05-02 DIAGNOSIS — Z79899 Other long term (current) drug therapy: Secondary | ICD-10-CM | POA: Insufficient documentation

## 2017-05-02 DIAGNOSIS — M7989 Other specified soft tissue disorders: Secondary | ICD-10-CM | POA: Diagnosis not present

## 2017-05-02 DIAGNOSIS — IMO0002 Reserved for concepts with insufficient information to code with codable children: Secondary | ICD-10-CM

## 2017-05-02 LAB — COMPREHENSIVE METABOLIC PANEL
ALBUMIN: 4.4 g/dL (ref 3.5–5.0)
ALK PHOS: 98 U/L (ref 38–126)
ALT: 17 U/L (ref 14–54)
ANION GAP: 11 (ref 5–15)
AST: 19 U/L (ref 15–41)
BILIRUBIN TOTAL: 0.6 mg/dL (ref 0.3–1.2)
BUN: 14 mg/dL (ref 6–20)
CALCIUM: 9.3 mg/dL (ref 8.9–10.3)
CO2: 25 mmol/L (ref 22–32)
Chloride: 102 mmol/L (ref 101–111)
Creatinine, Ser: 0.69 mg/dL (ref 0.44–1.00)
Glucose, Bld: 77 mg/dL (ref 65–99)
Potassium: 3.8 mmol/L (ref 3.5–5.1)
SODIUM: 138 mmol/L (ref 135–145)
TOTAL PROTEIN: 8.1 g/dL (ref 6.5–8.1)

## 2017-05-02 LAB — CBC
HCT: 42.2 % (ref 35.0–47.0)
Hemoglobin: 14.3 g/dL (ref 12.0–16.0)
MCH: 31.2 pg (ref 26.0–34.0)
MCHC: 34 g/dL (ref 32.0–36.0)
MCV: 91.8 fL (ref 80.0–100.0)
PLATELETS: 225 10*3/uL (ref 150–440)
RBC: 4.6 MIL/uL (ref 3.80–5.20)
RDW: 12.8 % (ref 11.5–14.5)
WBC: 9.3 10*3/uL (ref 3.6–11.0)

## 2017-05-02 MED ORDER — LIDOCAINE HCL (PF) 1 % IJ SOLN
INTRAMUSCULAR | Status: AC
  Filled 2017-05-02: qty 5

## 2017-05-02 MED ORDER — DEXTROSE 5 % IV SOLN
1.0000 g | Freq: Once | INTRAVENOUS | Status: AC
  Administered 2017-05-02: 1 g via INTRAVENOUS
  Filled 2017-05-02: qty 10

## 2017-05-02 MED ORDER — CLINDAMYCIN HCL 300 MG PO CAPS: 300.0000 mg | ORAL_CAPSULE | Freq: Four times a day (QID) | ORAL | 0 refills | Status: AC

## 2017-05-02 MED ORDER — FLUCONAZOLE 100 MG PO TABS: 150.0000 mg | ORAL_TABLET | Freq: Every day | ORAL | 0 refills | Status: AC

## 2017-05-02 MED ORDER — SODIUM CHLORIDE 0.9 % IV BOLUS (SEPSIS)
1000.0000 mL | Freq: Once | INTRAVENOUS | Status: AC
  Administered 2017-05-02: 1000 mL via INTRAVENOUS

## 2017-05-02 MED ORDER — VANCOMYCIN HCL 10 G IV SOLR
1500.0000 mg | Freq: Once | INTRAVENOUS | Status: AC
  Administered 2017-05-02: 1500 mg via INTRAVENOUS
  Filled 2017-05-02: qty 1500

## 2017-05-02 MED ORDER — OXYCODONE-ACETAMINOPHEN 5-325 MG PO TABS
1.0000 | ORAL_TABLET | Freq: Once | ORAL | Status: AC
  Administered 2017-05-02: 1 via ORAL
  Filled 2017-05-02: qty 1

## 2017-05-02 NOTE — Telephone Encounter (Signed)
noted 

## 2017-05-02 NOTE — ED Triage Notes (Signed)
States finger was cut at nail salon 1 1/2 weeks ago. Finger red and swollen. Took antibiotics from urgent care without relief.

## 2017-05-02 NOTE — ED Provider Notes (Signed)
-----------------------------------------   2:30 PM on 05/02/2017 -----------------------------------------  I have seen and evaluated the patient.  She appears to have a right third finger paronychia.  Patient does have mild erythema tracking down the finger to the distal hand.  The finger itself remains soft although moderately tender especially to the distal aspect.  Patient is able to flex the finger with pain but is able to do so.  I performed a digital block on the patient and incised the paronychia with approximately 3-5 cc of pus removed.  Patient has been on antibiotics for the past 4-5 days including Keflex and Bactrim.  Patient's labs including white blood cell count are normal.  We will infuse IV vancomycin in the emergency department.  As the patient had such a large paronychia that is now drained I believe a trial of antibiotics is still warranted at home before requiring admission to the hospital.  The patient is very reasonable and rational appearing.  I discussed with her in 24 hours if the finger looks any worse or she spikes a fever she is to return immediately to the emergency department for further evaluation and consideration of admission to the hospital for IV antibiotics.  However at this time as the paronychia drained so well believe the patient will do well with oral clindamycin.  Patient agreeable to this plan of care.   Harvest Dark, MD 05/02/17 1433

## 2017-05-02 NOTE — Telephone Encounter (Signed)
Patient is calling she is on her way to the ED- she has been seen in the office twice for  R middle finger  infection she got while having her nails done at the salon. She has been given Bactrim and Keflex. She reports her finger is  Swollen extremely large and throbbing to the point that she is going to the ED. Agree with patient plan - will alert the ED she is coming.  Reason for Disposition . Severe pain in the wound  Protocols used: WOUND INFECTION-A-AH

## 2017-05-02 NOTE — ED Provider Notes (Signed)
[facility] Emergency Department Provider Note  ____________________________________________  Time seen: Approximately 1:17 PM  I have reviewed the triage vital signs and the nursing notes.   HISTORY  Chief Complaint Finger Injury    HPI Linda [name] is a 41 y.o. female that presents to the emergency department for evaluation of left ring finger pain and swelling for several days that worsened this morning. Patient states that she had her nails done 2 weeks ago and thinks they injured her cuticle. She went to her primary care doctor and was placed on Keflex and Bactrim 4 days ago. She has been taking antibiotics but swelling and pain has worsened. This morning her entire finger was swollen and painful. She denies fever, numbness, tingling.   Past Medical History:  Diagnosis Date  . Anxiety   . Asthma   . GERD (gastroesophageal reflux disease)   . Heart murmur    while pregnant  . Migraine     Patient Active Problem List   Diagnosis Date Noted  . Felon of finger of right hand 04/29/2017  . GERD (gastroesophageal reflux disease) 01/03/2017  . Seasonal allergies 01/03/2017  . Migraine without aura and without status migrainosus, not intractable 08/04/2015  . Tobacco use 08/04/2015  . Anxiety 04/15/2014  . Obesity (BMI 30-39.9) 01/31/2014    Past Surgical History:  Procedure Laterality Date  . ABDOMINAL HYSTERECTOMY  12/20012   Total  . ENDOMETRIAL ABLATION  2012  . GASTRIC ROUX-EN-Y N/A 05/18/2016   Procedure: LAPAROSCOPIC ROUX-EN-Y GASTRIC WITH UPPER ENDO;  Surgeon: Arta Bruce Kinsinger, MD;  Location: [facility];  Service: General;  Laterality: N/A;  . MYRINGOTOMY WITH TUBE PLACEMENT     X 3 SINCE ORRIGINALLY PLACE IN 2010  . TONSILECTOMY, ADENOIDECTOMY, BILATERAL MYRINGOTOMY AND TUBES  2010  . TONSILLECTOMY    . TUBAL LIGATION  08/2010  . UPPER GI ENDOSCOPY  05/18/2016   Procedure: UPPER GI ENDOSCOPY;  Surgeon: Arta Bruce Kinsinger, MD;   Location: [facility];  Service: General;;    Prior to Admission medications   Medication Sig Start Date End Date Taking? Authorizing Provider  ALPRAZolam (XANAX) 0.25 MG tablet Take 1 tablet (0.25 mg total) by mouth 2 (two) times daily as needed. 04/22/17   Jearld Fenton, NP  Calcium Citrate-Vitamin D (CALCIUM CITRATE CHEWY BITE) 500-500 MG-UNIT CHEW Chew 1 each by mouth 4 (four) times daily.    [provider]  cephALEXin (KEFLEX) 500 MG capsule Take 1 capsule (500 mg total) by mouth 3 (three) times daily. 04/29/17   Hoyt Koch, MD  clindamycin (CLEOCIN) 300 MG capsule Take 1 capsule (300 mg total) by mouth 4 (four) times daily. 05/02/17 05/12/17  Laban Emperor, PA-C  diphenhydrAMINE (BENADRYL) 25 MG tablet Take 1 tablet (25 mg total) by mouth every 4 (four) hours as needed for itching. 05/25/16   Mackuen, Courteney Lyn, MD  fluconazole (DIFLUCAN) 100 MG tablet Take 1.5 tablets (150 mg total) by mouth daily. 05/02/17 05/03/17  Laban Emperor, PA-C  naproxen sodium (ANAPROX) 220 MG tablet Take 440 mg by mouth 2 (two) times daily as needed (for pain).    [provider]  OVER THE COUNTER MEDICATION Take 1 tablet by mouth 2 (two) times daily.    [provider]  pantoprazole (PROTONIX) 40 MG tablet TAKE 1 TABLET (40 MG TOTAL) BY MOUTH DAILY. 01/03/17   Jearld Fenton, NP  VENTOLIN HFA 108 (90 Base) MCG/ACT inhaler INHALE 2 PUFFS INTO THE LUNGS EVERY 6 (  SIX) HOURS AS NEEDED FOR WHEEZING OR SHORTNESS OF BREATH. 11/09/16   Jearld Fenton, NP  zonisamide (ZONEGRAN) 100 MG capsule Take 2 capsules (200 mg total) by mouth daily. 05/25/16   Pieter Partridge, DO    Allergies Bacitra-neomycin-polymyxin-hc and Hydrocodone-acetaminophen  Family History  Problem Relation Age of Onset  . Hypertension Father   . Diabetes Father   . Hypertension Mother   . Cancer Mother        uterine  . Stroke Mother   . Diabetes Maternal Aunt   . Diabetes Maternal Uncle   . Cancer  Maternal Grandmother        PANCREATIC  . Hypertension Maternal Grandmother   . Diabetes Maternal Grandmother   . Cancer Maternal Grandfather        LUNG  . Hypertension Maternal Grandfather   . Diabetes Maternal Grandfather   . Cancer Paternal Grandmother        THROAT  . Alzheimer's disease Paternal Grandmother     Social History Social History  Substance Use Topics  . Smoking status: Never Smoker  . Smokeless tobacco: Never Used  . Alcohol use 0.6 oz/week    1 Standard drinks or equivalent per week     Comment: occasional (not much)     Review of Systems  Constitutional: No fever/chills Cardiovascular: No chest pain. Respiratory: No SOB. Gastrointestinal: No abdominal pain.  No nausea, no vomiting.  Musculoskeletal: Positive for finger pain. Skin: Negative for ecchymosis.   ____________________________________________   PHYSICAL EXAM:  VITAL SIGNS: ED Triage Vitals  Enc Vitals Group     BP 05/02/17 0950 121/73     Pulse Rate 05/02/17 0950 61     Resp 05/02/17 0950 18     Temp 05/02/17 0950 98.4 F (36.9 C)     Temp Source 05/02/17 0950 Oral     SpO2 05/02/17 0950 100 %     Weight 05/02/17 0950 170 lb (77.1 kg)     Height 05/02/17 0950 5\' 8"  (1.727 m)     Head Circumference --      Peak Flow --      Pain Score 05/02/17 0949 8     Pain Loc --      Pain Edu? --      Excl. in [place]? --      Constitutional: Alert and oriented. Well appearing and in no acute distress. Eyes: Conjunctivae are normal. PERRL. EOMI. Head: Atraumatic. ENT:      Ears:      Nose: No congestion/rhinnorhea.      Mouth/Throat: Mucous membranes are moist.  Neck: No stridor.   Cardiovascular: Normal rate, regular rhythm.  Good peripheral circulation. Respiratory: Normal respiratory effort without tachypnea or retractions. Lungs CTAB. Good air entry to the bases with no decreased or absent breath sounds. Gastrointestinal: Bowel sounds 4 quadrants. Soft and nontender to palpation. No  guarding or rigidity. No palpable masses. No distention.  Musculoskeletal: Full range of motion to all extremities. No gross deformities appreciated. Fusiform swelling and erythema to right ring finger. Small area of swelling and fluctuance to the base of right ring fingernail. No drainage. Neurologic:  Normal speech and language. No gross focal neurologic deficits are appreciated.  Skin:  Skin is warm, dry and intact.    ____________________________________________   LABS (all labs ordered are listed, but only abnormal results are displayed)  Labs Reviewed  CULTURE, BLOOD (ROUTINE X 2)  CULTURE, BLOOD (ROUTINE X 2)  CBC  COMPREHENSIVE METABOLIC PANEL  ____________________________________________  EKG   ____________________________________________  RADIOLOGY Robinette Haines, personally viewed and evaluated these images (plain radiographs) as part of my medical decision making, as well as reviewing the written report by the radiologist.  Dg Hand Complete Right  Result Date: 05/02/2017 CLINICAL DATA:  Pain redness and swelling at RIGHT middle finger with redness and swelling extending to the top of the hand, infection EXAM: RIGHT HAND - COMPLETE 3+ VIEW COMPARISON:  None FINDINGS: Osseous mineralization normal. Joint spaces preserved. Diffuse soft tissue swelling throughout RIGHT middle finger extending to overlie the MCP joints. No acute fracture or dislocation. No periosteal reaction or radiopaque foreign body. Small cortical lucency at radial aspect base of proximal phalanx index finger, benign in appearance, question benign periosteal cyst. IMPRESSION: Soft tissue swelling RIGHT middle phalanx without acute bony abnormalities. Electronically Signed   By: Lavonia Dana M.D.   On: 05/02/2017 11:50    ____________________________________________    PROCEDURES  Procedure(s) performed:    Procedures    Medications  lidocaine (PF) (XYLOCAINE) 1 % injection (not  administered)  vancomycin (VANCOCIN) 1,500 mg in sodium chloride 0.9 % 500 mL IVPB (0 mg Intravenous Stopped 05/02/17 1634)  cefTRIAXone (ROCEPHIN) 1 g in dextrose 5 % 50 mL IVPB (0 g Intravenous Stopped 05/02/17 1355)  oxyCODONE-acetaminophen (PERCOCET/ROXICET) 5-325 MG per tablet 1 tablet (1 tablet Oral Given 05/02/17 1244)  sodium chloride 0.9 % bolus 1,000 mL (0 mLs Intravenous Stopped 05/02/17 1635)     ____________________________________________   INITIAL IMPRESSION / ASSESSMENT AND PLAN / ED COURSE  Pertinent labs & imaging results that were available during my care of the patient were reviewed by me and considered in my medical decision making (see chart for details).  Review of the [place] CSRS was performed in accordance of the [place] prior to dispensing any controlled drugs.  Patient presented to the emergency department for evaluation of right ring finger swelling and erythema. Vital signs and exam are reassuring. No WBC on CBC. Dr. Kerman Passey was consulted since I was not sure that enough would drain from paronychia to send patient home on oral antibiotics after patient failed two oral antibiotics. He drained paronychia and recommended that patient try one more course of oral antibiotics. She was given IV vancomycin and ceftriaxone in ED. Patient will be discharged home with prescriptions for clindamycin. Patient is to follow up with PCP as directed. Patient is given ED precautions to return to the ED for any worsening or new symptoms.     ____________________________________________  FINAL CLINICAL IMPRESSION(S) / ED DIAGNOSES  Final diagnoses:  Felon  Paronychia of finger of right hand      NEW MEDICATIONS STARTED DURING THIS VISIT:  Discharge Medication List as of 05/02/2017  3:54 PM    START taking these medications   Details  clindamycin (CLEOCIN) 300 MG capsule Take 1 capsule (300 mg total) by mouth 4 (four) times daily., Starting Mon 05/02/2017, Until Thu  05/12/2017, Print    fluconazole (DIFLUCAN) 100 MG tablet Take 1.5 tablets (150 mg total) by mouth daily., Starting Mon 05/02/2017, Until Tue 05/03/2017, Print            This chart was dictated using voice recognition software/Dragon. Despite best efforts to proofread, errors can occur which can change the meaning. Any change was purely unintentional.    Laban Emperor, PA-C 05/02/17 1727    Harvest Dark, MD 05/04/17 [phone]

## 2017-05-02 NOTE — ED Notes (Signed)
Pt with right finger #3 swelling, red, painful. Redness and swelling extending to top of hand.

## 2017-05-07 LAB — CULTURE, BLOOD (ROUTINE X 2)
CULTURE: NO GROWTH
Culture: NO GROWTH
SPECIAL REQUESTS: ADEQUATE

## 2017-05-19 ENCOUNTER — Other Ambulatory Visit: Payer: Self-pay | Admitting: Internal Medicine

## 2017-05-23 MED ORDER — ALPRAZOLAM 0.25 MG PO TABS: 0.2500 mg | ORAL_TABLET | Freq: Two times a day (BID) | ORAL | 0 refills | Status: DC | PRN

## 2017-05-23 NOTE — Telephone Encounter (Signed)
Last filled 04/22/17... Please advise

## 2017-05-23 NOTE — Telephone Encounter (Signed)
Rx called in to pharmacy. 

## 2017-05-23 NOTE — Telephone Encounter (Signed)
Ok to phone in Xanax 

## 2017-06-18 ENCOUNTER — Other Ambulatory Visit: Payer: Self-pay | Admitting: Neurology

## 2017-06-22 ENCOUNTER — Other Ambulatory Visit: Payer: Self-pay | Admitting: Internal Medicine

## 2017-06-22 ENCOUNTER — Telehealth: Payer: Self-pay | Admitting: Neurology

## 2017-06-22 MED ORDER — ZOLMITRIPTAN 5 MG PO TABS: 5.0000 mg | ORAL_TABLET | ORAL | 0 refills | Status: DC | PRN

## 2017-06-22 NOTE — Telephone Encounter (Signed)
Pt called and wants to know if she can get a refill of Zomig called in  CB# [phone]

## 2017-06-22 NOTE — Telephone Encounter (Signed)
Last filled 05/23/17... Please advise

## 2017-06-22 NOTE — Telephone Encounter (Signed)
I denied a refill earlier today as Pt has not been seen in a yr and did not have a f/u appt. Sh has now made f/u OV, sending Rx to [facility]

## 2017-06-22 NOTE — Telephone Encounter (Signed)
Call pt:  We need to bring her in to discuss her anxiety. If she is taking Xanax this often, she really needs to be on a daily preventative treatment.

## 2017-06-23 NOTE — Telephone Encounter (Signed)
Called pt and no VM set up... Also sent msg via mychart

## 2017-06-27 MED ORDER — ALPRAZOLAM 0.25 MG PO TABS: 0.2500 mg | ORAL_TABLET | Freq: Two times a day (BID) | ORAL | 0 refills | Status: DC | PRN

## 2017-06-27 NOTE — Addendum Note (Signed)
Addended by: Lurlean Nanny on: 06/27/2017 04:10 PM   Modules accepted: Orders

## 2017-06-27 NOTE — Telephone Encounter (Signed)
msg sent via mychart and Rx called into pharmacy

## 2017-06-27 NOTE — Telephone Encounter (Signed)
Ok to phone in Xanax but she needs to come in so that we can discuss. Taking every other day is too frequent.

## 2017-07-06 ENCOUNTER — Encounter: Payer: Self-pay | Admitting: Neurology

## 2017-07-06 ENCOUNTER — Ambulatory Visit: Payer: Federal, State, Local not specified - PPO | Admitting: Neurology

## 2017-07-06 VITALS — BP 122/90 | HR 74 | Ht 68.0 in | Wt 171.0 lb

## 2017-07-06 DIAGNOSIS — G43009 Migraine without aura, not intractable, without status migrainosus: Secondary | ICD-10-CM | POA: Diagnosis not present

## 2017-07-06 DIAGNOSIS — G4485 Primary stabbing headache: Secondary | ICD-10-CM

## 2017-07-06 MED ORDER — ZOLMITRIPTAN 5 MG PO TABS
ORAL_TABLET | ORAL | 6 refills | Status: DC
Start: 2017-07-06 — End: 2017-09-15

## 2017-07-06 MED ORDER — ZONISAMIDE 100 MG PO CAPS: 200.0000 mg | ORAL_CAPSULE | Freq: Every day | ORAL | 11 refills | Status: DC

## 2017-07-06 NOTE — Patient Instructions (Addendum)
1.  Continue zonisamide 200mg  daily 2.  Take Zomig 5mg  earliest onset of migraine.  May repeat once in 2 hours if needed.  Do not exceed 2 tablets in 24 hours. 3.  We will check CTA of head 4.  Follow up in 1 year.

## 2017-07-06 NOTE — Progress Notes (Signed)
NEUROLOGY FOLLOW UP OFFICE NOTE  MAISON AGRUSA [id number]  HISTORY OF PRESENT ILLNESS: Linda [name] is a 41 year old right-handed woman with history of hysterectomy, migraine and panic attacks who follows up for migraine.   UPDATE:  Migraines are stable. Intensity:  6-10/10 Duration:  brief with Zomig 5mg   Frequency:  3 days per month Current abortive medication:  Zomig 5mg  po Antihypertensive medications:  none Antidepressant medications:  none Anticonvulsant medications:  zonisamide 200mg  Vitamins/Herbal/Supplements:  MVI Other therapy:  none Other medication:  alprazolam, pantoprazole  Since end of October, she has been experiencing a new headache.  It is a severe stabbing headache, usually in the left frontal region, which may radiate down the side of her neck.  It lasts just a few seconds.  There are no associated symptoms.  It has gradually become more frequent, occurring every week to every other week.  There is no specific trigger and it resolves spontaneously.   HISTORY: Onset:  Around age 80 Location:  Varies.  Either temple, bi-frontal, sometimes radiating down the right side of neck Quality:  Migraines are severe hammer.  Daily headaches are dull throbbing Initial Intensity:  Migraines are 10/10.  Daily headaches 3/10; November: 6-10/10 Aura:  no Prodrome:  no Associated symptoms:  Nausea, phonophobia, osmophobia, sometimes sees spots, sometimes vomiting.    Initial Duration:  3-5 days; November: brief with Zomig Initial Frequency:  Once a month (but has daily headache); November: 3 days per month Triggers/exacerbating factors:  Wine, hunger, scents Relieving factors:  none Activity:  Cannot function with migraines   Past abortive therapy:  Fioricet, Maxalt 5mg  (initially helpful), Relpax (ineffective), sumatriptan 50mg  po (caused head burning), Exedrin, ibuprofen, naproxen, Midrin Past preventative therapy:  Topamax (side effects)   Current abortive  therapy:  none Current preventative therapy:  none Other medications:  citalopram 10mg  (just started 6 days ago)   Caffeine:  Tea every other day Alcohol:  rarely   Smoker:  no Diet:  Poor.  Stays hydrated Exercise:  no Depression/stress:  She has been experiencing panic attacks.  Just started citalopram. Sleep hygiene:  Poor.  Prolonged sleep latency.  Wakes up often.  About 3-4 hours per night Family history of headache:  no   She had a CT of the head performed on 09/08/06 for headache, which was reviewed, which was unremarkable.  It did reveal a punctate calcification along the right transverse sinus, within normal limits, and is showed some fluid in the mastoid air cells bilaterally.  PAST MEDICAL HISTORY: Past Medical History:  Diagnosis Date  . Anxiety   . Asthma   . GERD (gastroesophageal reflux disease)   . Heart murmur    while pregnant  . Migraine     MEDICATIONS: Current Outpatient Medications on File Prior to Visit  Medication Sig Dispense Refill  . ALPRAZolam (XANAX) 0.25 MG tablet Take 1 tablet (0.25 mg total) by mouth 2 (two) times daily as needed. 20 tablet 0  . Calcium Citrate-Vitamin D (CALCIUM CITRATE CHEWY BITE) 500-500 MG-UNIT CHEW Chew 1 each by mouth 4 (four) times daily.    . cephALEXin (KEFLEX) 500 MG capsule Take 1 capsule (500 mg total) by mouth 3 (three) times daily. (Patient not taking: Reported on 07/06/2017) 21 capsule 0  . diphenhydrAMINE (BENADRYL) 25 MG tablet Take 1 tablet (25 mg total) by mouth every 4 (four) hours as needed for itching. 20 tablet 0  . naproxen sodium (ANAPROX) 220 MG tablet Take 440 mg by mouth  2 (two) times daily as needed (for pain).    Marland Kitchen OVER THE COUNTER MEDICATION Take 1 tablet by mouth 2 (two) times daily.    . pantoprazole (PROTONIX) 40 MG tablet TAKE 1 TABLET (40 MG TOTAL) BY MOUTH DAILY. 30 tablet 11  . VENTOLIN HFA 108 (90 Base) MCG/ACT inhaler INHALE 2 PUFFS INTO THE LUNGS EVERY 6 (SIX) HOURS AS NEEDED FOR WHEEZING OR  SHORTNESS OF BREATH. 18 Inhaler 0   No current facility-administered medications on file prior to visit.     ALLERGIES: Allergies  Allergen Reactions  . Bacitra-Neomycin-Polymyxin-Hc Other (See Comments)    Pain and swelling of ear canal  . Hydrocodone-Acetaminophen Rash    FAMILY HISTORY: Family History  Problem Relation Age of Onset  . Hypertension Father   . Diabetes Father   . Hypertension Mother   . Cancer Mother        uterine  . Stroke Mother   . Diabetes Maternal Aunt   . Diabetes Maternal Uncle   . Cancer Maternal Grandmother        PANCREATIC  . Hypertension Maternal Grandmother   . Diabetes Maternal Grandmother   . Cancer Maternal Grandfather        LUNG  . Hypertension Maternal Grandfather   . Diabetes Maternal Grandfather   . Cancer Paternal Grandmother        THROAT  . Alzheimer's disease Paternal Grandmother     SOCIAL HISTORY: Social History   Socioeconomic History  . Marital status: Divorced    Spouse name: Not on file  . Number of children: Not on file  . Years of education: Not on file  . Highest education level: Not on file  Social Needs  . Financial resource strain: Not on file  . Food insecurity - worry: Not on file  . Food insecurity - inability: Not on file  . Transportation needs - medical: Not on file  . Transportation needs - non-medical: Not on file  Occupational History  . Not on file  Tobacco Use  . Smoking status: Never Smoker  . Smokeless tobacco: Never Used  Substance and Sexual Activity  . Alcohol use: Yes    Alcohol/week: 0.6 oz    Types: 1 Standard drinks or equivalent per week    Comment: occasional (not much)  . Drug use: No  . Sexual activity: Not on file  Other Topics Concern  . Not on file  Social History Narrative  . Not on file    REVIEW OF SYSTEMS: Constitutional: No fevers, chills, or sweats, no generalized fatigue, change in appetite Eyes: No visual changes, double vision, eye pain Ear, nose and  throat: No hearing loss, ear pain, nasal congestion, sore throat Cardiovascular: No chest pain, palpitations Respiratory:  No shortness of breath at rest or with exertion, wheezes GastrointestinaI: No nausea, vomiting, diarrhea, abdominal pain, fecal incontinence Genitourinary:  No dysuria, urinary retention or frequency Musculoskeletal:  No neck pain, back pain Integumentary: No rash, pruritus, skin lesions Neurological: as above Psychiatric: No depression, insomnia, anxiety Endocrine: No palpitations, fatigue, diaphoresis, mood swings, change in appetite, change in weight, increased thirst Hematologic/Lymphatic:  No purpura, petechiae. Allergic/Immunologic: no itchy/runny eyes, nasal congestion, recent allergic reactions, rashes  PHYSICAL EXAM: Vitals:   07/06/17 1053  BP: 122/90  Pulse: 74  SpO2: 99%   General: No acute distress.  Patient appears well-groomed.  Head:  Normocephalic/atraumatic Eyes:  Fundi examined but not visualized Neck: supple, no paraspinal tenderness, full range of motion Heart:  Regular  rate and rhythm Lungs:  Clear to auscultation bilaterally Back: No paraspinal tenderness Neurological Exam: alert and oriented to person, place, and time. Attention span and concentration intact, recent and remote memory intact, fund of knowledge intact.  Speech fluent and not dysarthric, language intact.  CN II-XII intact. Bulk and tone normal, muscle strength 5/5 throughout.  Sensation to light touch  intact.  Deep tendon reflexes 2+ throughout.  Finger to nose testing intact.  Gait normal, Romberg negative.  IMPRESSION: Migraine, stable Stabbing headache, new.  Rule out aneurysm.  PLAN: 1.  Continue zonisamide 200mg  daily and Zomig 5mg  as needed. 2.  Check CTA of head 3.  If unremarkable, follow up in one year.  Metta Clines, DO  CC:  Webb Silversmith, NP

## 2017-08-23 ENCOUNTER — Encounter: Payer: Self-pay | Admitting: Family Medicine

## 2017-08-23 ENCOUNTER — Ambulatory Visit: Payer: Federal, State, Local not specified - PPO | Admitting: Family Medicine

## 2017-08-23 ENCOUNTER — Other Ambulatory Visit: Payer: Self-pay

## 2017-08-23 DIAGNOSIS — J329 Chronic sinusitis, unspecified: Secondary | ICD-10-CM | POA: Insufficient documentation

## 2017-08-23 DIAGNOSIS — J0100 Acute maxillary sinusitis, unspecified: Secondary | ICD-10-CM | POA: Diagnosis not present

## 2017-08-23 MED ORDER — PREDNISONE 20 MG PO TABS: ORAL_TABLET | ORAL | 0 refills | Status: DC

## 2017-08-23 MED ORDER — AMOXICILLIN 500 MG PO CAPS: 1000.0000 mg | ORAL_CAPSULE | Freq: Two times a day (BID) | ORAL | 0 refills | Status: DC

## 2017-08-23 NOTE — Patient Instructions (Signed)
Start nasal saline spray or irrigation.Marland Kitchen 2-3 times daily.  Continue mucinex D twice daily.  Start prednisone taper x 6 days.  if not improving 4-5 days.Marland KitchenMarland KitchenFill rx for antibiotics.

## 2017-08-23 NOTE — Progress Notes (Signed)
   Subjective:    Patient ID: Linda [name], female    DOB: [birth year], 42 y.o.   MRN: [id number]  Sinusitis  This is a new problem. The current episode started in the past 7 days. The problem is unchanged. There has been no fever. The pain is moderate. Associated symptoms include congestion, headaches, sinus pressure and a sore throat. Pertinent negatives include no chills, coughing, ear pain or shortness of breath. Treatments tried: mucinex, theraflu. The treatment provided mild relief.    No antibiotic in last month   Blood pressure 110/78, pulse 61, temperature 98.6 F (37 C), temperature source Oral, height 5' 7.75" (1.721 m), weight 177 lb 8 oz (80.5 kg), last menstrual period 07/12/2011.   Review of Systems  Constitutional: Negative for chills.  HENT: Positive for congestion, sinus pressure and sore throat. Negative for ear pain.   Respiratory: Negative for cough and shortness of breath.   Neurological: Positive for headaches.      Objective:   Physical Exam  Constitutional: Vital signs are normal. She appears well-developed and well-nourished. She is cooperative.  Non-toxic appearance. She does not appear ill. No distress.  HENT:  Head: Normocephalic.  Right Ear: Hearing, tympanic membrane, external ear and ear canal normal. Tympanic membrane is not erythematous, not retracted and not bulging.  Left Ear: Hearing, tympanic membrane, external ear and ear canal normal. Tympanic membrane is not erythematous, not retracted and not bulging.  Nose: Mucosal edema and rhinorrhea present. Right sinus exhibits maxillary sinus tenderness. Right sinus exhibits no frontal sinus tenderness. Left sinus exhibits maxillary sinus tenderness. Left sinus exhibits no frontal sinus tenderness.  Mouth/Throat: Uvula is midline, oropharynx is clear and moist and mucous membranes are normal.  Eyes: Conjunctivae, EOM and lids are normal. Pupils are equal, round, and reactive to light. Lids are everted and  swept, no foreign bodies found.  Neck: Trachea normal and normal range of motion. Neck supple. Carotid bruit is not present. No thyroid mass and no thyromegaly present.  Cardiovascular: Normal rate, regular rhythm, S1 normal, S2 normal, normal heart sounds, intact distal pulses and normal pulses. Exam reveals no gallop and no friction rub.  No murmur heard. Pulmonary/Chest: Effort normal and breath sounds normal. No tachypnea. No respiratory distress. She has no decreased breath sounds. She has no wheezes. She has no rhonchi. She has no rales.  Neurological: She is alert.  Skin: Skin is warm, dry and intact. No rash noted.  Psychiatric: Her speech is normal and behavior is normal. Judgment normal. Her mood appears not anxious. Cognition and memory are normal. She does not exhibit a depressed mood.          Assessment & Plan:

## 2017-08-23 NOTE — Assessment & Plan Note (Signed)
Most liekly viral source. Treat with oral steroid and mucolyitc, decongestant, nasal saline irrigation.  if not improving in 4-5 days.. Given rx for antibiotics.

## 2017-08-24 ENCOUNTER — Encounter: Payer: Self-pay | Admitting: Family Medicine

## 2017-08-25 ENCOUNTER — Other Ambulatory Visit: Payer: Self-pay | Admitting: Internal Medicine

## 2017-08-25 MED ORDER — ACYCLOVIR 5 % EX OINT: 1.0000 "application " | TOPICAL_OINTMENT | CUTANEOUS | 0 refills | Status: DC

## 2017-08-25 NOTE — Telephone Encounter (Signed)
Last filled 06/27/17... Please advise

## 2017-09-15 ENCOUNTER — Other Ambulatory Visit: Payer: Self-pay | Admitting: Neurology

## 2017-10-07 ENCOUNTER — Encounter: Payer: Self-pay | Admitting: Internal Medicine

## 2017-10-12 ENCOUNTER — Other Ambulatory Visit: Payer: Self-pay | Admitting: Internal Medicine

## 2017-10-12 MED ORDER — ALPRAZOLAM 0.25 MG PO TABS: 0.2500 mg | ORAL_TABLET | Freq: Two times a day (BID) | ORAL | 0 refills | Status: DC

## 2017-10-12 NOTE — Telephone Encounter (Signed)
Last filled 08/25/17... Please advise

## 2017-10-25 ENCOUNTER — Encounter: Payer: Self-pay | Admitting: Internal Medicine

## 2017-11-09 ENCOUNTER — Other Ambulatory Visit: Payer: Self-pay | Admitting: Internal Medicine

## 2017-11-09 NOTE — Telephone Encounter (Signed)
I do not see Dx to support Rx, last filled 11/2016... Please advise

## 2017-11-11 MED ORDER — ALBUTEROL SULFATE HFA 108 (90 BASE) MCG/ACT IN AERS: 2.0000 | INHALATION_SPRAY | Freq: Four times a day (QID) | RESPIRATORY_TRACT | 0 refills | Status: DC | PRN

## 2017-11-28 ENCOUNTER — Other Ambulatory Visit: Payer: Self-pay | Admitting: Internal Medicine

## 2017-11-28 MED ORDER — ALPRAZOLAM 0.25 MG PO TABS: 0.2500 mg | ORAL_TABLET | Freq: Two times a day (BID) | ORAL | 0 refills | Status: DC

## 2017-11-28 NOTE — Telephone Encounter (Signed)
Requesting refill xanax 0.25 mg to [facility];  Last refilled # 20 on 10/22/17 Last seen annual 01/03/17 Last seen acute 08/23/17.Please advise.

## 2017-12-01 ENCOUNTER — Ambulatory Visit: Payer: Federal, State, Local not specified - PPO | Admitting: Internal Medicine

## 2017-12-02 DIAGNOSIS — M25521 Pain in right elbow: Secondary | ICD-10-CM | POA: Diagnosis not present

## 2017-12-03 IMAGING — CT CT RENAL STONE PROTOCOL
2 of 4 series · 16 of 46 positions shown, 18 images · non-contrast
Comparison: None.

CLINICAL DATA: Left flank pain radiating to the groin, nausea and
vomiting for 5 days

EXAM:
CT ABDOMEN AND PELVIS WITHOUT CONTRAST
TECHNIQUE: Multidetector CT imaging of the abdomen and pelvis was performed
following the standard protocol without IV contrast.

[Series 2: stone study 5.0 i30f 1 · axial · 0.86mm/px · z∈[-496,-41]mm · 13 of 101 slices shown, 15 images]
[im 5/101  soft-tissue]
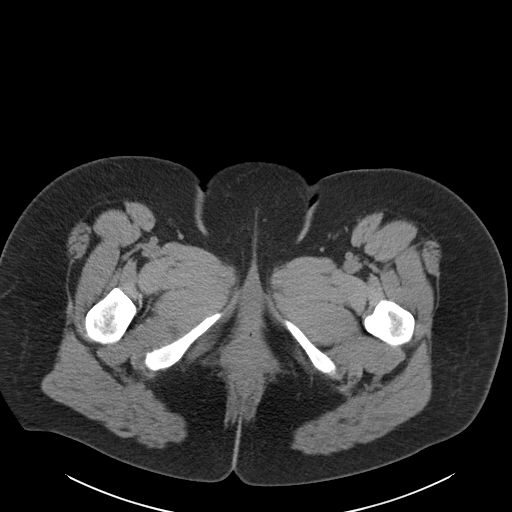
[im 5/101  bone]
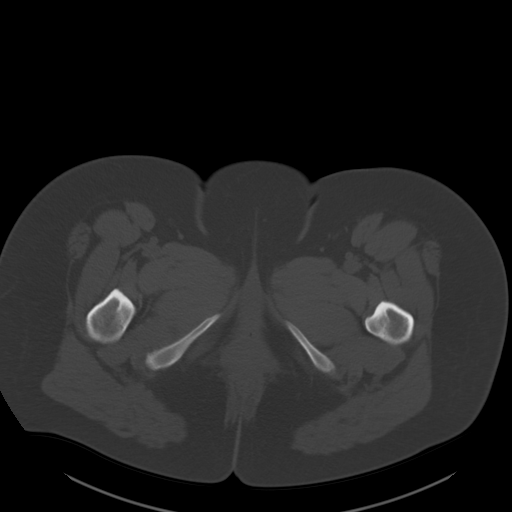
[im 13/101  soft-tissue]
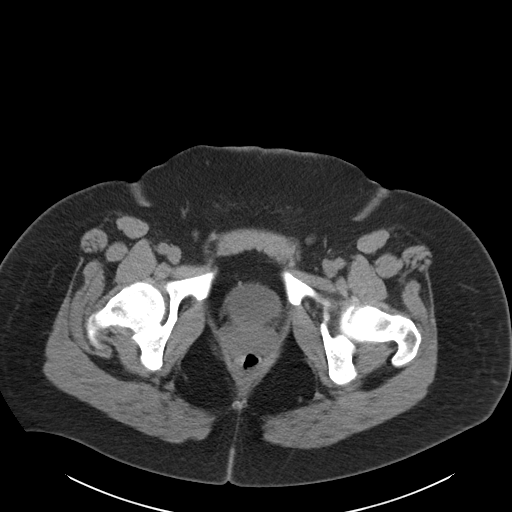
[im 21/101  soft-tissue]
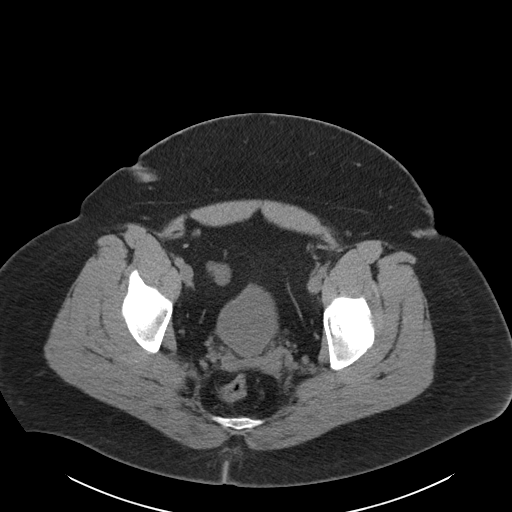
[im 30/101  soft-tissue]
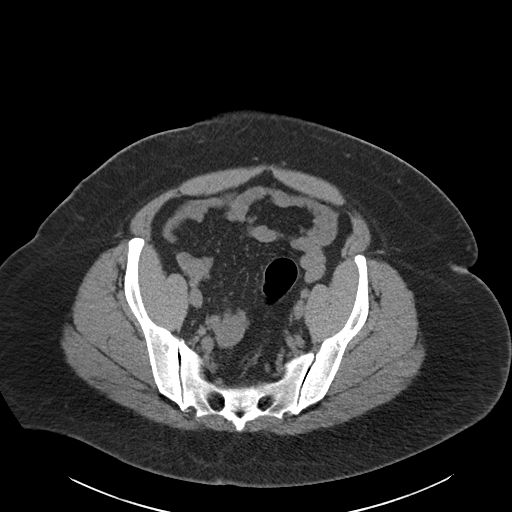
[im 34/101  soft-tissue]
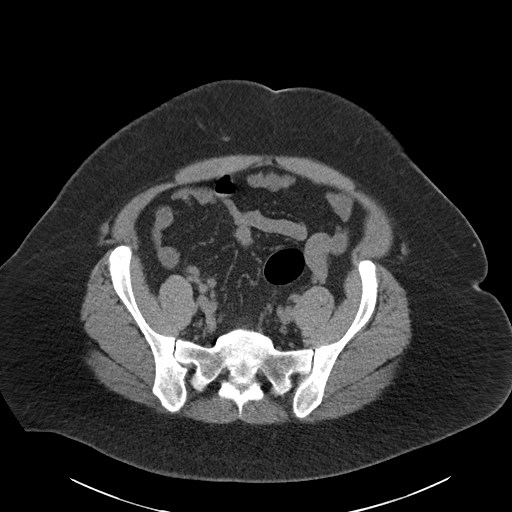
[im 42/101  soft-tissue]
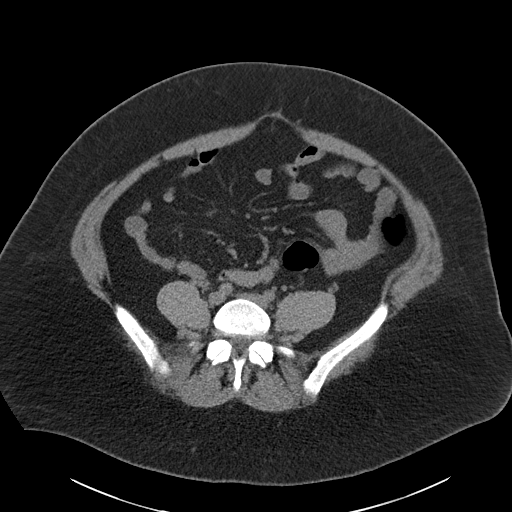
[im 51/101  soft-tissue]
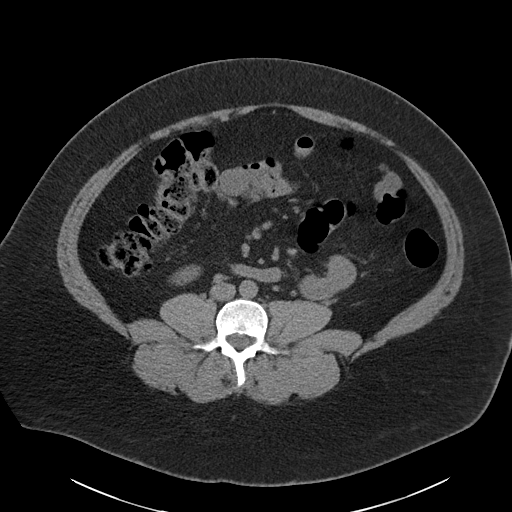
[im 59/101  soft-tissue]
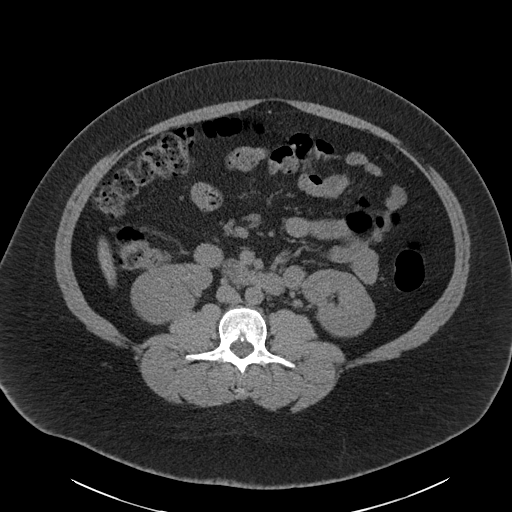
[im 67/101  soft-tissue]
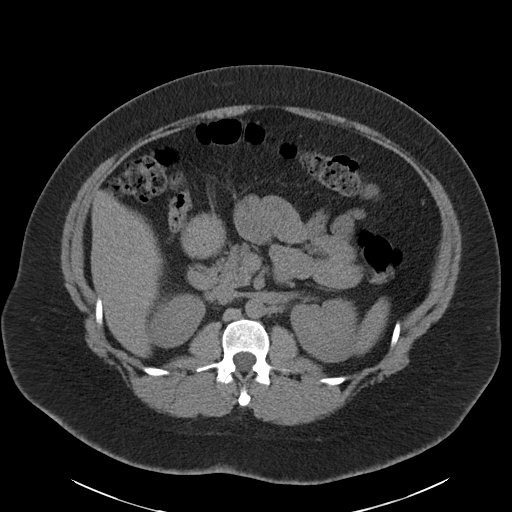
[im 67/101  bone]
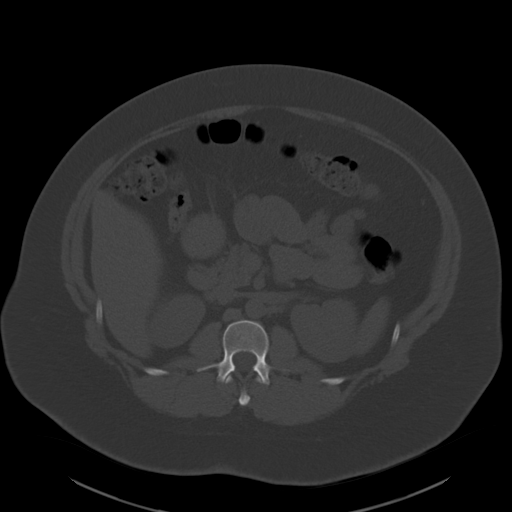
[im 71/101  soft-tissue]
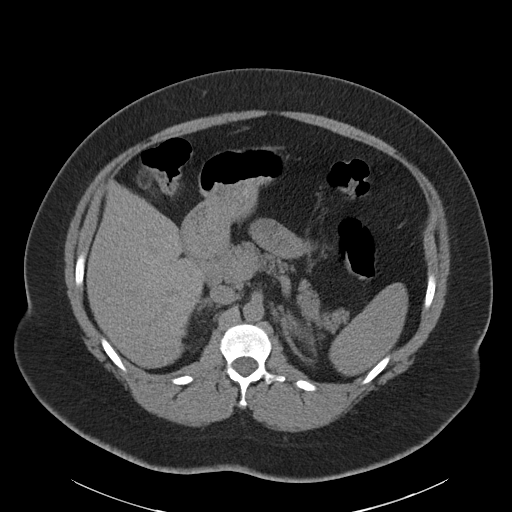
[im 80/101  soft-tissue]
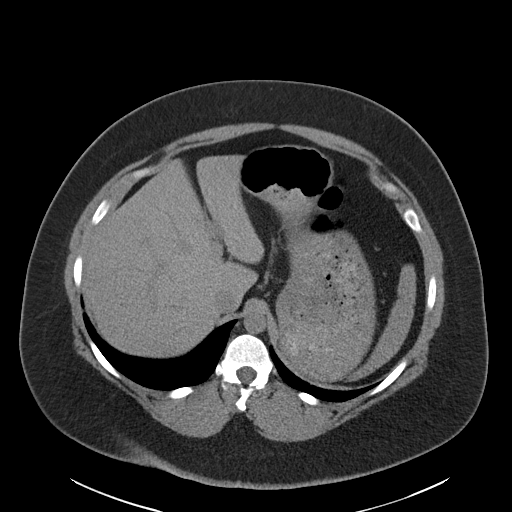
[im 88/101  soft-tissue]
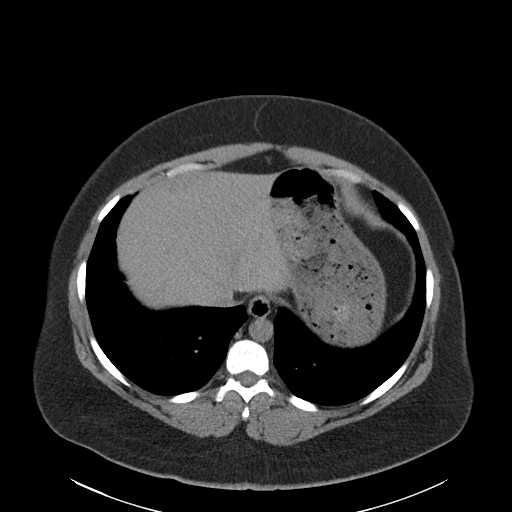
[im 96/101  soft-tissue]
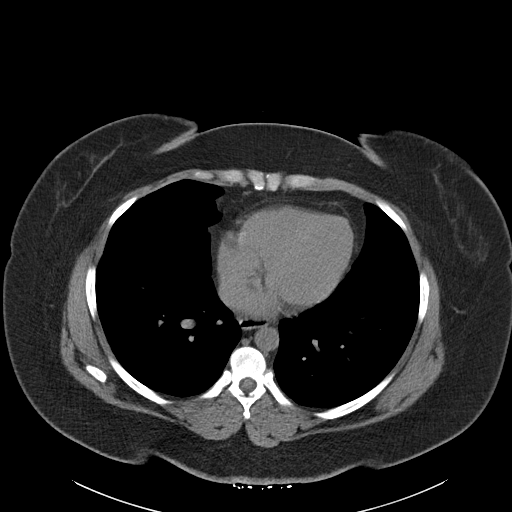

[Series 5: coronal soft tissue · coronal · 0.79mm/px · 3 of 107 slices shown]
[im 36/107  soft-tissue]
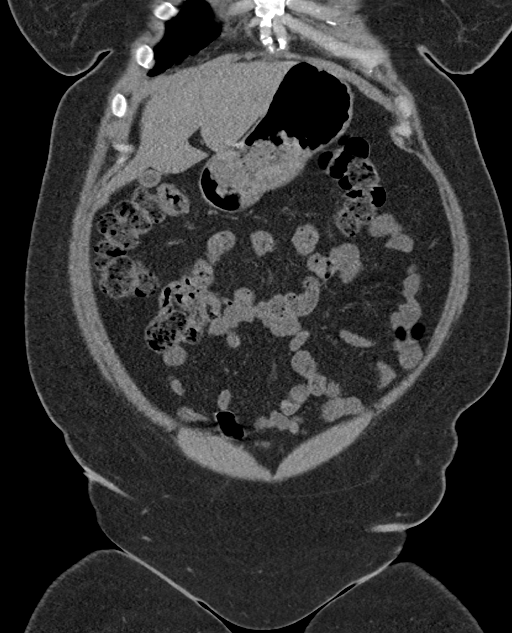
[im 48/107  soft-tissue]
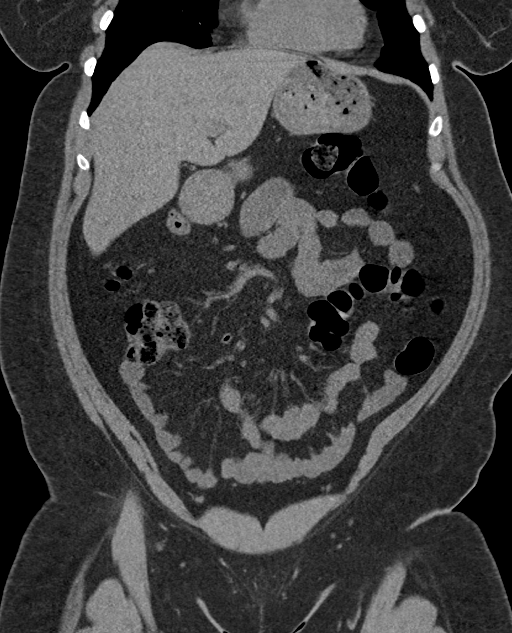
[im 59/107  soft-tissue]
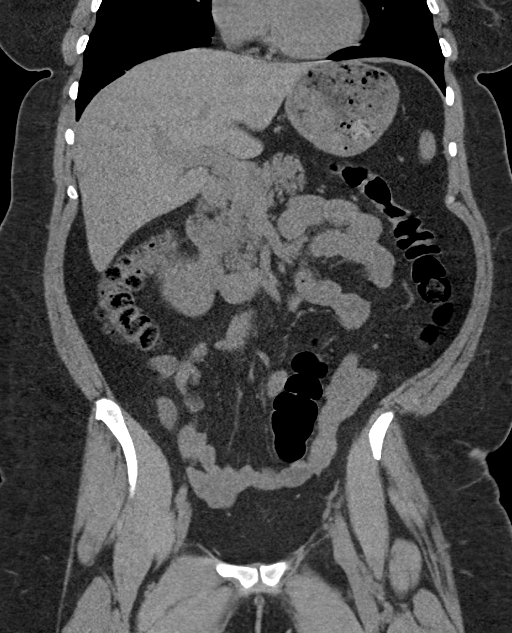

[16 of 46 positions shown; findings below may reference images not displayed]

FINDINGS: The lung bases are clear. The liver is unremarkable in the
unenhanced state. The gallbladder is contracted but no calcified
gallstones are seen. The pancreas is normal in size and the
pancreatic duct is not dilated. The adrenal glands and spleen are
unremarkable. Fluid and food debris fills the stomach. No renal
calculi are seen and there is no evidence of hydronephrosis. The
proximal ureters appear to be normal in caliber. The abdominal aorta
is normal in caliber. No adenopathy is seen.

The distal ureters are normal in caliber and no distal ureteral
calculus is seen. The uterus has previously been resected. No
adnexal lesion is seen. No free fluid is noted within the pelvis.
The urinary bladder is not well distended but no abnormality is
seen. The colon is largely decompressed. The terminal ileum and the
appendix are unremarkable. The lumbar vertebrae are in normal
alignment with normal intervertebral disc spaces.
IMPRESSION: 1. No explanation for the patient's left flank pain is seen. No
renal or ureteral calculi are noted.
2. The terminal ileum and the appendix are unremarkable.

## 2017-12-07 IMAGING — MR MR LUMBAR SPINE W/O CM
5 series · 31 of 48 positions shown · non-contrast
Comparison: CT abdomen and pelvis 09/02/2015.

CLINICAL DATA: Sciatica LEFT side.

EXAM:
MRI LUMBAR SPINE WITHOUT CONTRAST
TECHNIQUE: Multiplanar, multisequence MR imaging of the lumbar spine was
performed. No intravenous contrast was administered.

[Series 2: T1 · sagittal · 4.0mm · 0.51mm/px · 6 of 14 slices shown (1 of 2)]
[im 1/14]
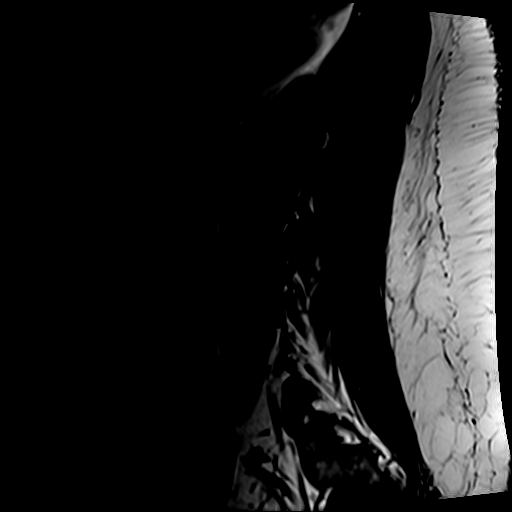
[im 3/14]
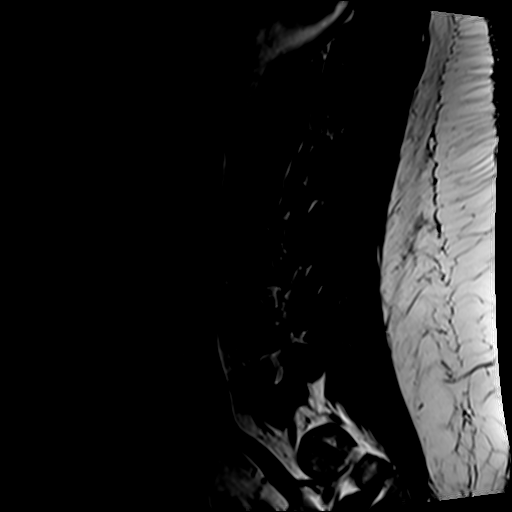
[im 6/14]
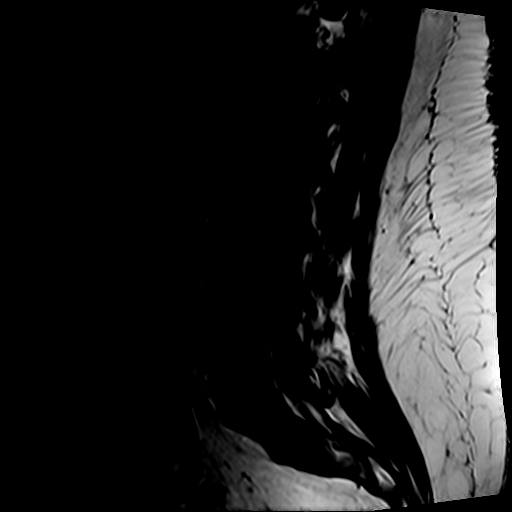
[im 8/14]
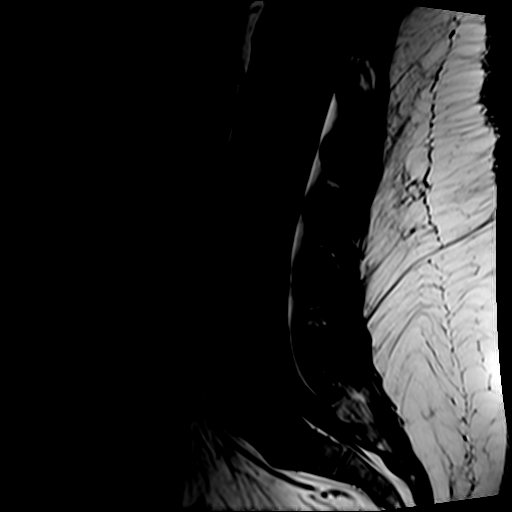
[im 11/14]
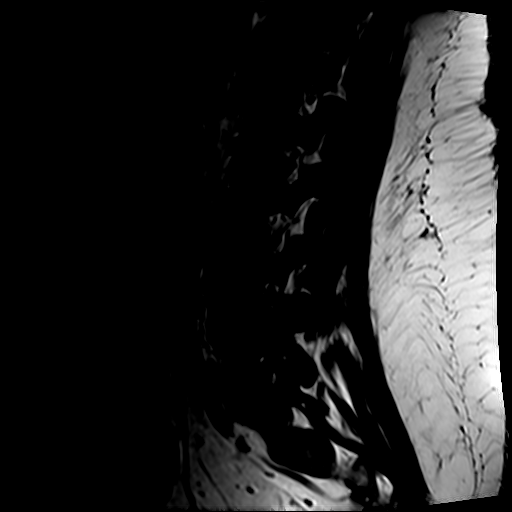
[im 14/14]
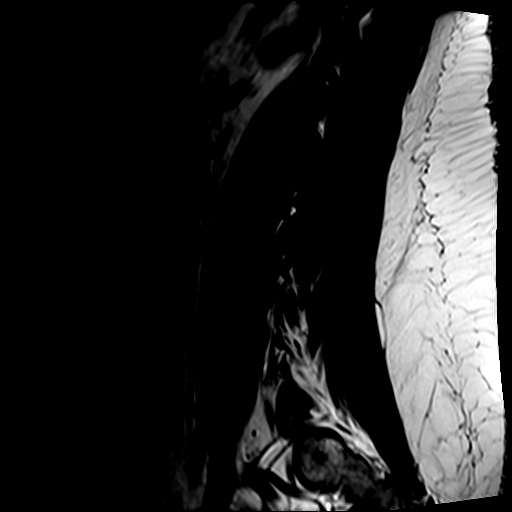

[Series 3: T2 · sagittal · 4.0mm · 0.81mm/px · 6 of 14 slices shown (1 of 2)]
[im 1/14]
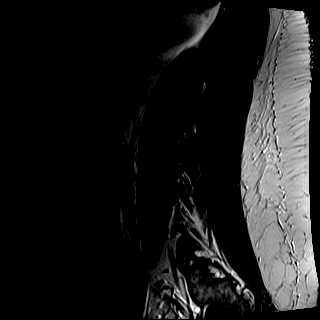
[im 3/14]
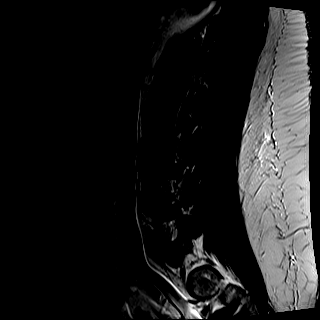
[im 6/14]
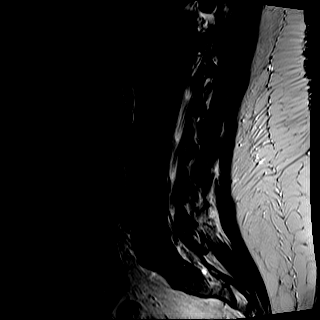
[im 8/14]
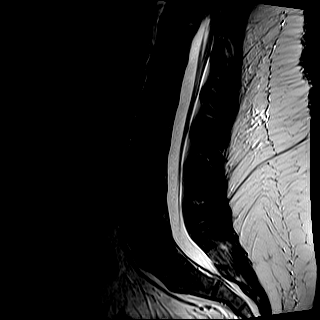
[im 11/14]
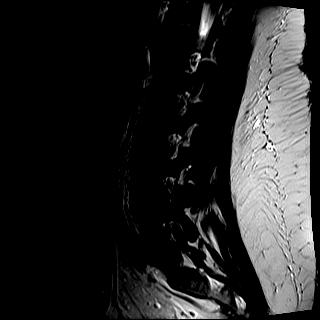
[im 14/14]
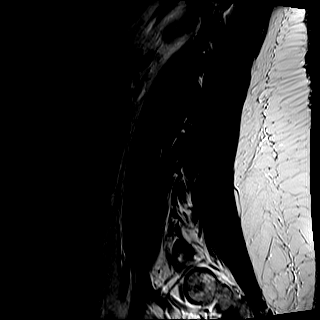

[Series 4: STIR · sagittal · 4.0mm · 1.02mm/px · 1 of 14 slices shown]
[im 1/14]
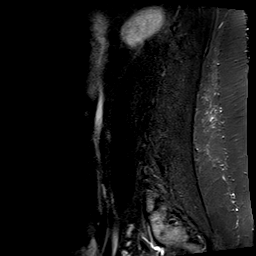

[Series 5: T2 · axial · 4.0mm · 0.39mm/px · z∈[-109,+101]mm · 9 of 34 slices shown (2 of 2)]
[im 1/34]
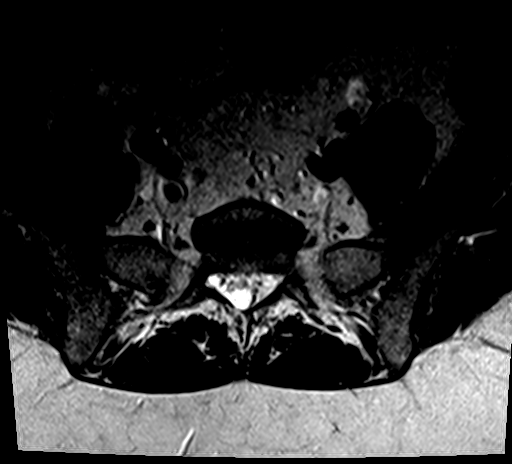
[im 5/34]
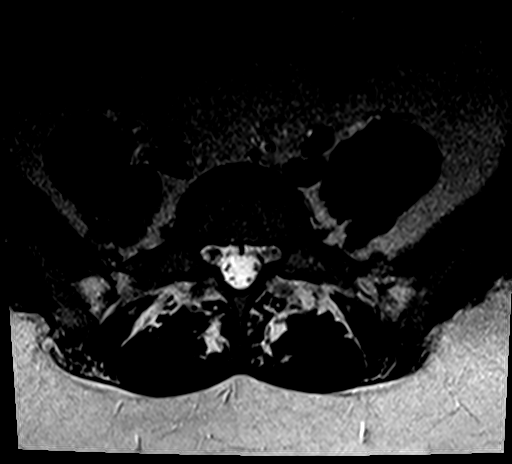
[im 10/34]
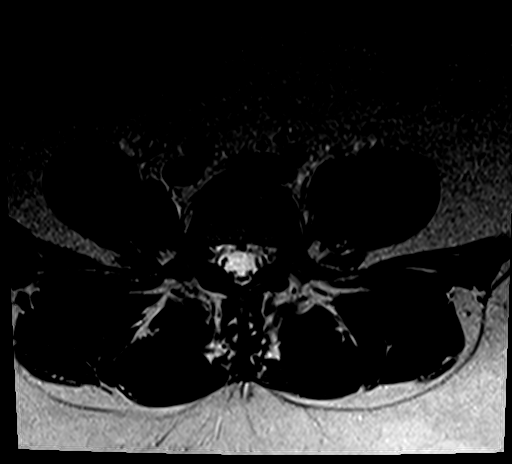
[im 15/34]
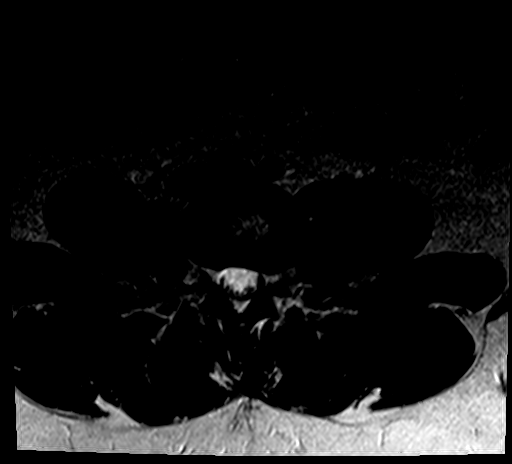
[im 17/34]
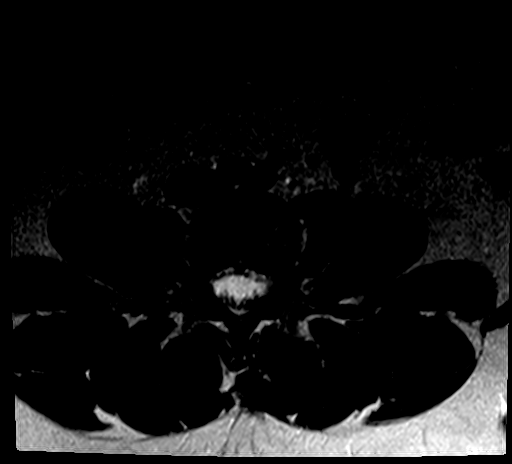
[im 19/34]
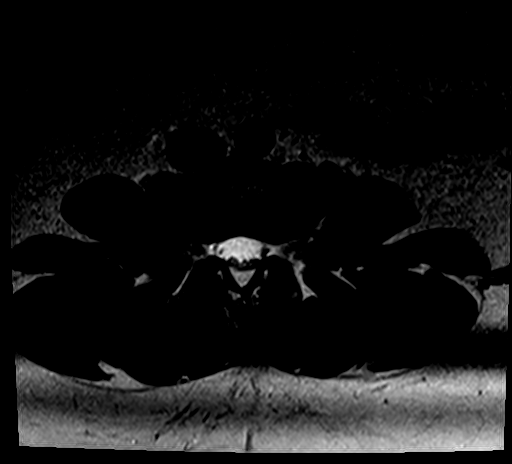
[im 24/34]
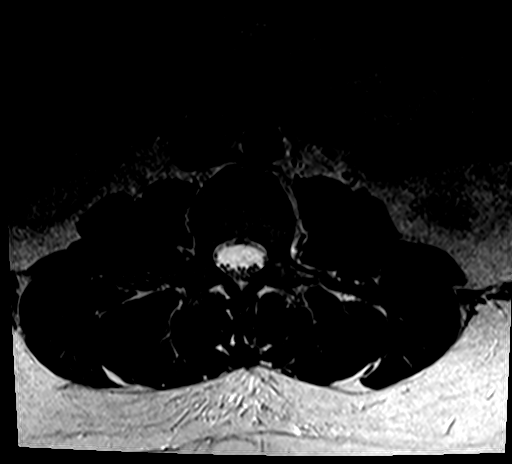
[im 29/34]
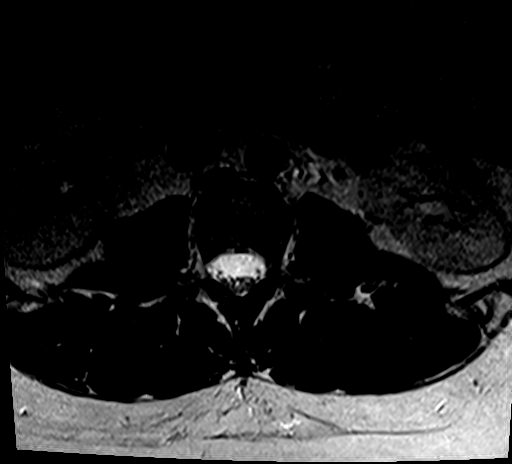
[im 34/34]
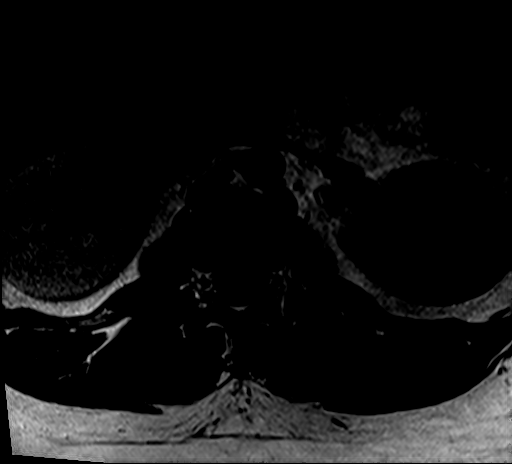

[Series 6: T1 · axial · 4.0mm · 0.78mm/px · z∈[-109,+101]mm · 9 of 34 slices shown (2 of 2)]
[im 1/34]
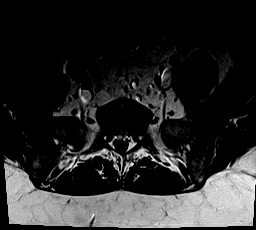
[im 5/34]
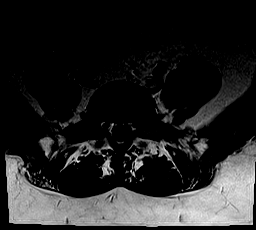
[im 10/34]
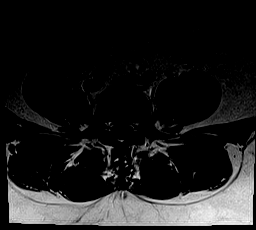
[im 15/34]
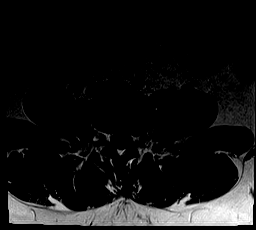
[im 17/34]
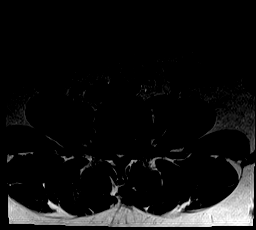
[im 19/34]
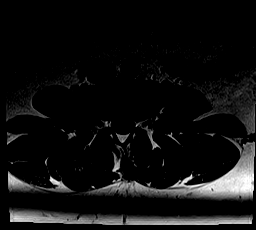
[im 24/34]
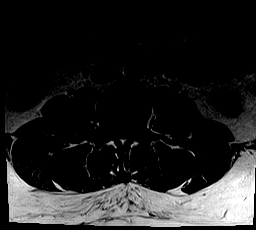
[im 29/34]
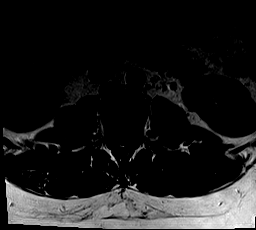
[im 34/34]
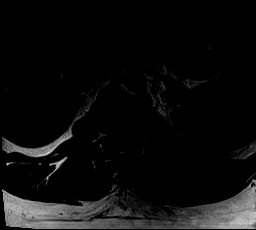

[31 of 48 positions shown; findings below may reference images not displayed]

FINDINGS: Segmentation: Transitional anatomy. S1 is only partially sacralized.

Alignment:  Normal.

Vertebrae: No worrisome osseous lesion.

Conus medullaris: Normal in size, signal, and location.

Paraspinal tissues: No evidence for hydronephrosis or paravertebral
mass.

Disc levels:

L1-L2:  Normal.

L2-L3:  Normal.

L3-L4:  Normal.

L4-L5: There is slight annular bulging to the LEFT. LEFT L4 nerve
root irritation is possible in the extraforaminal soft tissues. No
annular rent is definitely seen. Mild facet arthropathy.

L5-S1: No disc protrusion or spinal stenosis. Mild facet
arthropathy. No neural impingement.
IMPRESSION: Minor lumbar disc pathology as described. Slight annular bulging at
L4-5 in the foramen and beyond on the LEFT could irritate the LEFT
L4 nerve root. Mild lower lumbar facet arthropathy without
compressive lesion.

## 2017-12-08 DIAGNOSIS — M25521 Pain in right elbow: Secondary | ICD-10-CM | POA: Diagnosis not present

## 2017-12-13 DIAGNOSIS — M25521 Pain in right elbow: Secondary | ICD-10-CM | POA: Diagnosis not present

## 2017-12-19 ENCOUNTER — Other Ambulatory Visit: Payer: Self-pay

## 2017-12-19 ENCOUNTER — Emergency Department (HOSPITAL_BASED_OUTPATIENT_CLINIC_OR_DEPARTMENT_OTHER): Payer: Federal, State, Local not specified - PPO

## 2017-12-19 ENCOUNTER — Encounter (HOSPITAL_BASED_OUTPATIENT_CLINIC_OR_DEPARTMENT_OTHER): Payer: Self-pay | Admitting: Emergency Medicine

## 2017-12-19 ENCOUNTER — Emergency Department (HOSPITAL_BASED_OUTPATIENT_CLINIC_OR_DEPARTMENT_OTHER)
Admission: EM | Admit: 2017-12-19 | Discharge: 2017-12-19 | Disposition: A | Discharge status: Home / Self Care | Payer: Federal, State, Local not specified - PPO | Attending: Emergency Medicine | Admitting: Emergency Medicine

## 2017-12-19 DIAGNOSIS — M25511 Pain in right shoulder: Secondary | ICD-10-CM | POA: Diagnosis not present

## 2017-12-19 DIAGNOSIS — M25521 Pain in right elbow: Secondary | ICD-10-CM | POA: Diagnosis not present

## 2017-12-19 DIAGNOSIS — S42254A Nondisplaced fracture of greater tuberosity of right humerus, initial encounter for closed fracture: Secondary | ICD-10-CM

## 2017-12-19 DIAGNOSIS — Z79899 Other long term (current) drug therapy: Secondary | ICD-10-CM | POA: Insufficient documentation

## 2017-12-19 DIAGNOSIS — Z9884 Bariatric surgery status: Secondary | ICD-10-CM | POA: Diagnosis not present

## 2017-12-19 DIAGNOSIS — Y998 Other external cause status: Secondary | ICD-10-CM | POA: Diagnosis not present

## 2017-12-19 DIAGNOSIS — W010XXA Fall on same level from slipping, tripping and stumbling without subsequent striking against object, initial encounter: Secondary | ICD-10-CM | POA: Insufficient documentation

## 2017-12-19 DIAGNOSIS — Y9344 Activity, trampolining: Secondary | ICD-10-CM | POA: Diagnosis not present

## 2017-12-19 DIAGNOSIS — F419 Anxiety disorder, unspecified: Secondary | ICD-10-CM | POA: Insufficient documentation

## 2017-12-19 DIAGNOSIS — R52 Pain, unspecified: Secondary | ICD-10-CM

## 2017-12-19 DIAGNOSIS — Y9289 Other specified places as the place of occurrence of the external cause: Secondary | ICD-10-CM | POA: Diagnosis not present

## 2017-12-19 DIAGNOSIS — S42251A Displaced fracture of greater tuberosity of right humerus, initial encounter for closed fracture: Secondary | ICD-10-CM | POA: Diagnosis not present

## 2017-12-19 DIAGNOSIS — J45909 Unspecified asthma, uncomplicated: Secondary | ICD-10-CM | POA: Insufficient documentation

## 2017-12-19 DIAGNOSIS — S4991XA Unspecified injury of right shoulder and upper arm, initial encounter: Secondary | ICD-10-CM | POA: Diagnosis not present

## 2017-12-19 MED ORDER — OXYCODONE-ACETAMINOPHEN 5-325 MG PO TABS: 1.0000 | ORAL_TABLET | Freq: Two times a day (BID) | ORAL | 0 refills | Status: DC | PRN

## 2017-12-19 MED ORDER — OXYCODONE-ACETAMINOPHEN 5-325 MG PO TABS
1.0000 | ORAL_TABLET | Freq: Once | ORAL | Status: AC
  Administered 2017-12-19: 1 via ORAL
  Filled 2017-12-19: qty 1

## 2017-12-19 MED ORDER — NAPROXEN 500 MG PO TABS: 500.0000 mg | ORAL_TABLET | Freq: Two times a day (BID) | ORAL | 0 refills | Status: DC

## 2017-12-19 MED FILL — OXYCODONE-ACETAMINOPHEN 5-3: 5-325 | 5 days supply | Qty: 10 | Fill #0

## 2017-12-19 NOTE — ED Provider Notes (Signed)
[facility] Provider Note   CSN: [id number] Arrival date & time: 12/19/17  0109     History   Chief Complaint Chief Complaint  Patient presents with  . Shoulder Pain    HPI Linda [name] is a 42 y.o. female.  HPI   This is a 43 year old female who presents with right shoulder and arm pain.  Patient reports that she was jumping at a trampoline park when she fell onto her right arm and shoulder.  She reports that she has had increasing pain in her right shoulder that radiates down into her right elbow.  Pain is controlled if she keeps her arm flexed but she has increasing pain with shoulder rotation as well as elbow flexion and extension.  She denies numbness and tingling of the hand.  Currently she rates her pain at 6 out of 10.  She has not taken anything for the pain.  Patient reports that she heard a pop when the injury occurred and has had difficulty abducting her shoulder since.  Denies other injury.   Past Medical History:  Diagnosis Date  . Anxiety   . Asthma   . GERD (gastroesophageal reflux disease)   . Heart murmur    while pregnant  . Migraine     Patient Active Problem List   Diagnosis Date Noted  . GERD (gastroesophageal reflux disease) 01/03/2017  . Seasonal allergies 01/03/2017  . Migraine without aura and without status migrainosus, not intractable 08/04/2015  . Anxiety 04/15/2014    Past Surgical History:  Procedure Laterality Date  . ABDOMINAL HYSTERECTOMY  12/20012   Total  . ENDOMETRIAL ABLATION  2012  . GASTRIC ROUX-EN-Y N/A 05/18/2016   Procedure: LAPAROSCOPIC ROUX-EN-Y GASTRIC WITH UPPER ENDO;  Surgeon: Arta Bruce Kinsinger, MD;  Location: [facility];  Service: General;  Laterality: N/A;  . MYRINGOTOMY WITH TUBE PLACEMENT     X 3 SINCE ORRIGINALLY PLACE IN 2010  . TONSILECTOMY, ADENOIDECTOMY, BILATERAL MYRINGOTOMY AND TUBES  2010  . TONSILLECTOMY    . TUBAL LIGATION  08/2010  . UPPER GI ENDOSCOPY  05/18/2016   Procedure: UPPER GI ENDOSCOPY;  Surgeon: Arta Bruce Kinsinger, MD;  Location: [facility];  Service: General;;     OB History   None      Home Medications    Prior to Admission medications   Medication Sig Start Date End Date Taking? Authorizing Provider  acyclovir ointment (ZOVIRAX) 5 % Apply 1 application topically every 3 (three) hours. 08/25/17   Jearld Fenton, NP  albuterol (VENTOLIN HFA) 108 (90 Base) MCG/ACT inhaler Inhale 2 puffs into the lungs every 6 (six) hours as needed for wheezing or shortness of breath. 11/11/17   Jearld Fenton, NP  ALPRAZolam Duanne Moron) 0.25 MG tablet Take 1 tablet (0.25 mg total) by mouth 2 (two) times daily. 11/28/17   Jearld Fenton, NP  amoxicillin (AMOXIL) 500 MG capsule Take 2 capsules (1,000 mg total) by mouth 2 (two) times daily. FILL if not improving after 4-5 days, VOID after 09/10/2017 08/23/17   Bedsole, Amy E, MD  pantoprazole (PROTONIX) 40 MG tablet TAKE 1 TABLET (40 MG TOTAL) BY MOUTH DAILY. 01/03/17   Jearld Fenton, NP  predniSONE (DELTASONE) 20 MG tablet 3 tabs by mouth daily x 3 days, then 2 tabs by mouth daily x 2 days then 1 tab by mouth daily x 2 days 08/23/17   Jinny Sanders, MD  zolmitriptan (ZOMIG) 5 MG tablet TAKE ONE TABLET BY  MOUTH AT ON SET OF HEADACHE. CAN TAKE ONE MORE TABLET 2 HOURS LATER IF NEEDED. 09/15/17   Pieter Partridge, DO  zonisamide (ZONEGRAN) 100 MG capsule Take 2 capsules (200 mg total) by mouth daily. 07/06/17   Pieter Partridge, DO    Family History Family History  Problem Relation Age of Onset  . Hypertension Father   . Diabetes Father   . Hypertension Mother   . Cancer Mother        uterine  . Stroke Mother   . Diabetes Maternal Aunt   . Diabetes Maternal Uncle   . Cancer Maternal Grandmother        PANCREATIC  . Hypertension Maternal Grandmother   . Diabetes Maternal Grandmother   . Cancer Maternal Grandfather        LUNG  . Hypertension Maternal Grandfather   . Diabetes Maternal Grandfather   . Cancer Paternal  Grandmother        THROAT  . Alzheimer's disease Paternal Grandmother     Social History Social History   Tobacco Use  . Smoking status: Never Smoker  . Smokeless tobacco: Never Used  Substance Use Topics  . Alcohol use: Yes    Alcohol/week: 0.6 oz    Types: 1 Standard drinks or equivalent per week    Comment: occasional (not much)  . Drug use: No     Allergies   Bacitra-neomycin-polymyxin-hc and Hydrocodone-acetaminophen   Review of Systems Review of Systems  Musculoskeletal:       Right shoulder right elbow pain  Neurological: Negative for weakness and numbness.  All other systems reviewed and are negative.    Physical Exam Updated Vital Signs Ht 5\' 8"  (1.727 m)   Wt 78 kg (172 lb)   LMP 07/12/2011   BMI 26.15 kg/m   Physical Exam  Constitutional: She is oriented to person, place, and time. She appears well-developed and well-nourished.  HENT:  Head: Normocephalic and atraumatic.  Eyes: Pupils are equal, round, and reactive to light.  Cardiovascular: Normal rate and regular rhythm.  Pulmonary/Chest: Effort normal. No respiratory distress.  Musculoskeletal:  Focused examination of the right upper extremity, pain with range of motion with flexion and extension of the elbow, no obvious deformities, patient with guarded range of motion and abduction of the shoulder, limited secondary to pain, no clavicular or AC joint tenderness, 2+ radial pulse, neurovascular intact distally  Neurological: She is alert and oriented to person, place, and time.  Skin: Skin is warm and dry.  Psychiatric: She has a normal mood and affect.  Nursing note and vitals reviewed.    ED Treatments / Results  Labs (all labs ordered are listed, but only abnormal results are displayed) Labs Reviewed - No data to display  EKG None  Radiology No results found.  Procedures Procedures (including critical care time)  Medications Ordered in ED Medications  oxyCODONE-acetaminophen  (PERCOCET/ROXICET) 5-325 MG per tablet 1 tablet (has no administration in time range)     Initial Impression / Assessment and Plan / ED Course  I have reviewed the triage vital signs and the nursing notes.  Pertinent labs & imaging results that were available during my care of the patient were reviewed by me and considered in my medical decision making (see chart for details).    Patient presents with right arm pain after falling on her arm.  OVerall, nontoxic.  ROM intact but limited by pain.  No deformities but point TTP.  Xrays pending.  Patient given  pain meds.  Final Clinical Impressions(s) / ED Diagnoses   Final diagnoses:  Acute pain of right shoulder  Closed nondisplaced fracture of greater tuberosity of right humerus, initial encounter    ED Discharge Orders    None       Merryl Hacker, MD 12/22/17 [phone]

## 2017-12-19 NOTE — ED Provider Notes (Signed)
7:30 AM Assumed care from Dr. Dina Rich, please see their note for full history, physical and decision making until this point. In brief this is a 42 y.o. year old female who presented to the ED tonight with Shoulder Pain     Found to have a greater tuberosity fracture.  This is likely cause of her pain.  Will treat with immobilization and sports medicine follow-up as this likely nonoperative at this time.  We will also give a short prescription for narcotics but will still encourage NSAID use.  Patient neurovascular intact at time of discharge.  Sling given.  Discharge instructions, including strict return precautions for new or worsening symptoms, given. Patient and/or family verbalized understanding and agreement with the plan as described.   Labs, studies and imaging reviewed by myself and considered in medical decision making if ordered. Imaging interpreted by radiology.  Labs Reviewed - No data to display  DG Elbow Complete Right  Final Result    DG Humerus Right  Final Result    DG Shoulder Right  Final Result      No follow-ups on file.    Linda [name], Corene Cornea, MD 12/19/17 0730

## 2017-12-19 NOTE — ED Notes (Signed)
Pt here alone. RN requested pt confirm she has a ride home prior to administering narcotics.

## 2017-12-19 NOTE — Discharge Instructions (Addendum)
You were seen today for shoulder pain.  Your x-rays do show a small fracture.  This could be associated with a ligamentous injury.  Only use the shoulder immobilizer and sling as directed.  Make sure to do range of motion exercises throughout the day to prevent frozen shoulder.  Take pain medications as prescribed.  Follow-up with sports medicine.

## 2017-12-19 NOTE — ED Triage Notes (Signed)
[place] [employer] yesterday. States she felt a "pop" in her R shoulder and has been unable to lift it since.

## 2017-12-20 ENCOUNTER — Telehealth: Payer: Self-pay | Admitting: Family Medicine

## 2017-12-20 NOTE — Telephone Encounter (Signed)
error 

## 2017-12-22 ENCOUNTER — Encounter: Payer: Self-pay | Admitting: Family Medicine

## 2017-12-22 ENCOUNTER — Ambulatory Visit: Payer: Federal, State, Local not specified - PPO | Admitting: Family Medicine

## 2017-12-22 DIAGNOSIS — S4991XA Unspecified injury of right shoulder and upper arm, initial encounter: Secondary | ICD-10-CM | POA: Diagnosis not present

## 2017-12-22 MED ORDER — DICLOFENAC SODIUM 75 MG PO TBEC: 75.0000 mg | DELAYED_RELEASE_TABLET | Freq: Two times a day (BID) | ORAL | 1 refills | Status: DC

## 2017-12-22 MED ORDER — METHOCARBAMOL 500 MG PO TABS: 500.0000 mg | ORAL_TABLET | Freq: Three times a day (TID) | ORAL | 1 refills | Status: DC | PRN

## 2017-12-22 NOTE — Patient Instructions (Addendum)
You have a nondisplaced fracture of your greater tuberosity of your shoulder. Your elbow x-rays look good - I don't see anything subtle here or fluid in the joint - it's just a contusion. Voltaren twice a day with food for pain and inflammation. Don't take aleve or ibuprofen with this. Robaxin up to 3 times a day as needed for muscle spasms. Ok to take the percocet with this if you need to. Sling regularly but ok to take off to wash the area. Ice or heat 15 minutes at a time 3-4 times a day Follow up with me in 1 week for reevaluation, repeat x-rays.

## 2017-12-23 ENCOUNTER — Other Ambulatory Visit: Payer: Self-pay | Admitting: Internal Medicine

## 2017-12-24 ENCOUNTER — Encounter: Payer: Self-pay | Admitting: Family Medicine

## 2017-12-24 DIAGNOSIS — S4991XD Unspecified injury of right shoulder and upper arm, subsequent encounter: Secondary | ICD-10-CM | POA: Insufficient documentation

## 2017-12-24 NOTE — Assessment & Plan Note (Signed)
independently reviewed radiographs and noted nondisplaced greater tuberosity fracture.  Elbow radiographs normal without effusion.  Sling.  Voltaren with robaxin, percocet if needed.  Sling regularly.  F/u in 1 week for reevaluation, repeat x-rays.  Plan to start motion exercises at that time.

## 2017-12-24 NOTE — Progress Notes (Signed)
PCP: Jearld Fenton, NP  Subjective:   HPI: Patient is a 42 y.o. female here for right shoulder injury.  Patient reports she was at a trampoline park when she was pushed by someone and landed onto right upper arm on the hard part between two trampolines. Immediate pain,  Inability to move right arm without a lot of pain. Went to ED and had x-rays showing subtle greater tuberosity fracture. She has been wearing a sling since then. Took a few percocet but don't seem to help much. Taking naproxen. Remotely dislocated this shoulder but no problems in ~20+ years. She is right handed. No skin changes, numbness. Pain level 5/10 and sharp lateral shoulder to elbow.  Past Medical History:  Diagnosis Date  . Anxiety   . Asthma   . GERD (gastroesophageal reflux disease)   . Heart murmur    while pregnant  . Migraine     Current Outpatient Medications on File Prior to Visit  Medication Sig Dispense Refill  . albuterol (VENTOLIN HFA) 108 (90 Base) MCG/ACT inhaler Inhale 2 puffs into the lungs every 6 (six) hours as needed for wheezing or shortness of breath. 18 Inhaler 0  . ALPRAZolam (XANAX) 0.25 MG tablet Take 1 tablet (0.25 mg total) by mouth 2 (two) times daily. 20 tablet 0  . oxyCODONE-acetaminophen (PERCOCET) 5-325 MG tablet Take 1 tablet by mouth every 12 (twelve) hours as needed for severe pain. 10 tablet 0  . predniSONE (DELTASONE) 20 MG tablet 3 tabs by mouth daily x 3 days, then 2 tabs by mouth daily x 2 days then 1 tab by mouth daily x 2 days 15 tablet 0  . zolmitriptan (ZOMIG) 5 MG tablet TAKE ONE TABLET BY MOUTH AT ON SET OF HEADACHE. CAN TAKE ONE MORE TABLET 2 HOURS LATER IF NEEDED. 10 tablet 5  . zonisamide (ZONEGRAN) 100 MG capsule Take 2 capsules (200 mg total) by mouth daily. 60 capsule 11   No current facility-administered medications on file prior to visit.     Past Surgical History:  Procedure Laterality Date  . ABDOMINAL HYSTERECTOMY  12/20012   Total  .  ENDOMETRIAL ABLATION  2012  . GASTRIC ROUX-EN-Y N/A 05/18/2016   Procedure: LAPAROSCOPIC ROUX-EN-Y GASTRIC WITH UPPER ENDO;  Surgeon: Arta Bruce Kinsinger, MD;  Location: [facility];  Service: General;  Laterality: N/A;  . MYRINGOTOMY WITH TUBE PLACEMENT     X 3 SINCE ORRIGINALLY PLACE IN 2010  . TONSILECTOMY, ADENOIDECTOMY, BILATERAL MYRINGOTOMY AND TUBES  2010  . TONSILLECTOMY    . TUBAL LIGATION  08/2010  . UPPER GI ENDOSCOPY  05/18/2016   Procedure: UPPER GI ENDOSCOPY;  Surgeon: Arta Bruce Kinsinger, MD;  Location: [facility];  Service: General;;    Allergies  Allergen Reactions  . Bacitra-Neomycin-Polymyxin-Hc Other (See Comments)    Pain and swelling of ear canal  . Hydrocodone-Acetaminophen Rash    Social History   Socioeconomic History  . Marital status: Divorced    Spouse name: Not on file  . Number of children: Not on file  . Years of education: Not on file  . Highest education level: Not on file  Occupational History  . Not on file  Social Needs  . Financial resource strain: Not on file  . Food insecurity:    Worry: Not on file    Inability: Not on file  . Transportation needs:    Medical: Not on file    Non-medical: Not on file  Tobacco Use  . Smoking status:  Never Smoker  . Smokeless tobacco: Never Used  Substance and Sexual Activity  . Alcohol use: Yes    Alcohol/week: 0.6 oz    Types: 1 Standard drinks or equivalent per week    Comment: occasional (not much)  . Drug use: No  . Sexual activity: Not on file  Lifestyle  . Physical activity:    Days per week: Not on file    Minutes per session: Not on file  . Stress: Not on file  Relationships  . Social connections:    Talks on phone: Not on file    Gets together: Not on file    Attends religious service: Not on file    Active member of club or organization: Not on file    Attends meetings of clubs or organizations: Not on file    Relationship status: Not on file  . Intimate partner violence:    Fear of  current or ex partner: Not on file    Emotionally abused: Not on file    Physically abused: Not on file    Forced sexual activity: Not on file  Other Topics Concern  . Not on file  Social History Narrative  . Not on file    Family History  Problem Relation Age of Onset  . Hypertension Father   . Diabetes Father   . Hypertension Mother   . Cancer Mother        uterine  . Stroke Mother   . Diabetes Maternal Aunt   . Diabetes Maternal Uncle   . Cancer Maternal Grandmother        PANCREATIC  . Hypertension Maternal Grandmother   . Diabetes Maternal Grandmother   . Cancer Maternal Grandfather        LUNG  . Hypertension Maternal Grandfather   . Diabetes Maternal Grandfather   . Cancer Paternal Grandmother        THROAT  . Alzheimer's disease Paternal Grandmother     BP 127/85   Pulse 63   Ht 5\' 8"  (1.727 m)   Wt 176 lb 12.8 oz (80.2 kg)   LMP 07/12/2011   BMI 26.88 kg/m   Review of Systems: See HPI above.     Objective:  Physical Exam:  Gen: NAD, comfortable in exam room  Right shoulder: No swelling, ecchymoses.  No gross deformity. TTP diffusely but greatest lateral upper arm over tuberosity. ROM not tested today.  FROM digits, wrist with 5/5 strength. Sensation intact to light touch including axillary distribution. NV intact distally.  Right elbow: No swelling, bruising, other deformity. Diffuse tenderness including upper arm. FROM with 5/5 strength. Collateral ligaments intact. NVI distally.    Assessment & Plan:  1. Right arm injury - independently reviewed radiographs and noted nondisplaced greater tuberosity fracture.  Elbow radiographs normal without effusion.  Sling.  Voltaren with robaxin, percocet if needed.  Sling regularly.  F/u in 1 week for reevaluation, repeat x-rays.  Plan to start motion exercises at that time.

## 2017-12-27 ENCOUNTER — Ambulatory Visit: Payer: Federal, State, Local not specified - PPO | Admitting: Family Medicine

## 2017-12-29 ENCOUNTER — Ambulatory Visit: Payer: Federal, State, Local not specified - PPO | Admitting: Family Medicine

## 2018-01-03 ENCOUNTER — Encounter: Payer: Self-pay | Admitting: Family Medicine

## 2018-01-03 ENCOUNTER — Ambulatory Visit: Payer: Federal, State, Local not specified - PPO | Admitting: Family Medicine

## 2018-01-03 ENCOUNTER — Ambulatory Visit (HOSPITAL_BASED_OUTPATIENT_CLINIC_OR_DEPARTMENT_OTHER)
Admission: RE | Admit: 2018-01-03 | Discharge: 2018-01-03 | Disposition: A | Discharge status: Home / Self Care | Payer: Federal, State, Local not specified - PPO | Source: Ambulatory Visit | Attending: Family Medicine | Admitting: Family Medicine

## 2018-01-03 VITALS — BP 108/78 | HR 73 | Ht 68.0 in | Wt 175.0 lb

## 2018-01-03 DIAGNOSIS — S42254D Nondisplaced fracture of greater tuberosity of right humerus, subsequent encounter for fracture with routine healing: Secondary | ICD-10-CM | POA: Diagnosis not present

## 2018-01-03 DIAGNOSIS — M25511 Pain in right shoulder: Secondary | ICD-10-CM

## 2018-01-03 DIAGNOSIS — S42251D Displaced fracture of greater tuberosity of right humerus, subsequent encounter for fracture with routine healing: Secondary | ICD-10-CM | POA: Diagnosis not present

## 2018-01-03 DIAGNOSIS — X58XXXD Exposure to other specified factors, subsequent encounter: Secondary | ICD-10-CM | POA: Diagnosis not present

## 2018-01-03 DIAGNOSIS — S4991XD Unspecified injury of right shoulder and upper arm, subsequent encounter: Secondary | ICD-10-CM

## 2018-01-03 NOTE — Patient Instructions (Signed)
Your x-rays look good, there's no displacement of the small fracture you have of the greater tuberosity. Icing if needed. Start arm circles, swings, table slides 3 sets of 10 once or twice a day to help regain your motion. Follow up with me in 2 weeks. If you're still struggling to regain your motion we will do an ultrasound.
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
## 2018-01-03 NOTE — Assessment & Plan Note (Signed)
independently reviewed repeat radiographs and again noted nondisplaced greater tuberosity fracture.  She feels much better clinically though abduction/flexion is poor.  Shown motion exercises to start today.  Icing, voltaren if needed.  If motion not improving at f/u in 2 weeks will do MS[facility] ultrasound.  Would be unusual to have greater tuberosity fracture and supraspinatus full thickness tear in someone as young as patient.

## 2018-01-05 ENCOUNTER — Encounter: Payer: Self-pay | Admitting: Internal Medicine

## 2018-01-05 ENCOUNTER — Other Ambulatory Visit: Payer: Self-pay | Admitting: Internal Medicine

## 2018-01-05 ENCOUNTER — Ambulatory Visit (INDEPENDENT_AMBULATORY_CARE_PROVIDER_SITE_OTHER): Payer: Federal, State, Local not specified - PPO | Admitting: Internal Medicine

## 2018-01-05 VITALS — BP 110/72 | HR 73 | Temp 98.1°F | Ht 68.0 in | Wt 173.0 lb

## 2018-01-05 DIAGNOSIS — G43009 Migraine without aura, not intractable, without status migrainosus: Secondary | ICD-10-CM

## 2018-01-05 DIAGNOSIS — S42411D Displaced simple supracondylar fracture without intercondylar fracture of right humerus, subsequent encounter for fracture with routine healing: Secondary | ICD-10-CM

## 2018-01-05 DIAGNOSIS — K219 Gastro-esophageal reflux disease without esophagitis: Secondary | ICD-10-CM | POA: Diagnosis not present

## 2018-01-05 DIAGNOSIS — Z79899 Other long term (current) drug therapy: Secondary | ICD-10-CM

## 2018-01-05 DIAGNOSIS — Z0001 Encounter for general adult medical examination with abnormal findings: Secondary | ICD-10-CM

## 2018-01-05 DIAGNOSIS — F419 Anxiety disorder, unspecified: Secondary | ICD-10-CM

## 2018-01-05 LAB — COMPREHENSIVE METABOLIC PANEL
ALT: 13 U/L (ref 0–35)
AST: 15 U/L (ref 0–37)
Albumin: 4.4 g/dL (ref 3.5–5.2)
Alkaline Phosphatase: 73 U/L (ref 39–117)
BILIRUBIN TOTAL: 0.4 mg/dL (ref 0.2–1.2)
BUN: 16 mg/dL (ref 6–23)
CALCIUM: 9.1 mg/dL (ref 8.4–10.5)
CO2: 29 meq/L (ref 19–32)
Chloride: 104 mEq/L (ref 96–112)
Creatinine, Ser: 0.84 mg/dL (ref 0.40–1.20)
GFR: 96.49 mL/min (ref >60.00)
Glucose, Bld: 94 mg/dL (ref 70–99)
Potassium: 3.9 mEq/L (ref 3.5–5.1)
Sodium: 138 mEq/L (ref 135–145)
Total Protein: 7.1 g/dL (ref 6.0–8.3)

## 2018-01-05 LAB — LDL CHOLESTEROL, DIRECT: Direct LDL: 89 mg/dL

## 2018-01-05 LAB — CBC
HCT: 39.6 % (ref 36.0–46.0)
HEMOGLOBIN: 13.4 g/dL (ref 12.0–15.0)
MCHC: 33.8 g/dL (ref 30.0–36.0)
MCV: 93 fl (ref 78.0–100.0)
PLATELETS: 270 10*3/uL (ref 150.0–400.0)
RBC: 4.26 Mil/uL (ref 3.87–5.11)
RDW: 12.5 % (ref 11.5–15.5)
WBC: 8.7 10*3/uL (ref 4.0–10.5)

## 2018-01-05 LAB — LIPID PANEL
CHOLESTEROL: 194 mg/dL (ref 0–200)
HDL: 84.2 mg/dL (ref >39.00)
NonHDL: 109.41
TRIGLYCERIDES: 202 mg/dL — AB (ref 0.0–149.0)
Total CHOL/HDL Ratio: 2
VLDL: 40.4 mg/dL — AB (ref 0.0–40.0)

## 2018-01-05 LAB — VITAMIN B12: Vitamin B-12: 439 pg/mL (ref 211–911)

## 2018-01-05 LAB — VITAMIN D 25 HYDROXY (VIT D DEFICIENCY, FRACTURES): VITD: 24.59 ng/mL — AB (ref 30.00–100.00)

## 2018-01-05 MED ORDER — ALPRAZOLAM 0.25 MG PO TABS: 0.2500 mg | ORAL_TABLET | Freq: Two times a day (BID) | ORAL | 0 refills | Status: DC

## 2018-01-05 MED ORDER — SERTRALINE HCL 25 MG PO TABS: 25.0000 mg | ORAL_TABLET | Freq: Every day | ORAL | 5 refills | Status: DC

## 2018-01-05 MED ORDER — DEXLANSOPRAZOLE 30 MG PO CPDR: 30.0000 mg | DELAYED_RELEASE_CAPSULE | Freq: Every day | ORAL | 5 refills | Status: DC

## 2018-01-05 NOTE — Progress Notes (Signed)
Subjective:    Patient ID: Linda [name], female    DOB: Dec 10, 1975, 42 y.o.   MRN: [id number]  HPI  Pt presents to the clinic today for her annual exam. She is also due to follow up chronic conditions.  Anxiety: Triggers include work stress, driving. She takes Xanax every other day. Last UDS 03/2016.  GERD: Triggered by red meat, spicy and greasy foods, caffeine. She is having breakthrough symptoms on Pantoprazole. She takes Ranitidine as needed with good relief.  Migraines: She has migraines about 1 x month. No longer using Zonegran. She take Zolmitriptan as needed. She follows with [name].  Flu: 04/2017 Tetanus: 08/2015 Pap Smear: hysterectomy Vision Screening: annually Dentist: annually  Diet: She does eat meat. She consumes fruits and veggies daily. She tries to avoid fried foods. She drinks mostly water. Exercise: Working out 2-3 days per week  Review of Systems      Past Medical History:  Diagnosis Date  . Anxiety   . Asthma   . GERD (gastroesophageal reflux disease)   . Heart murmur    while pregnant  . Migraine     Current Outpatient Medications  Medication Sig Dispense Refill  . albuterol (VENTOLIN HFA) 108 (90 Base) MCG/ACT inhaler Inhale 2 puffs into the lungs every 6 (six) hours as needed for wheezing or shortness of breath. 18 Inhaler 0  . ALPRAZolam (XANAX) 0.25 MG tablet Take 1 tablet (0.25 mg total) by mouth 2 (two) times daily. 20 tablet 0  . diclofenac (VOLTAREN) 75 MG EC tablet Take 1 tablet (75 mg total) by mouth 2 (two) times daily. 60 tablet 1  . pantoprazole (PROTONIX) 40 MG tablet TAKE 1 TABLET BY MOUTH DAILY 30 tablet 1  . zolmitriptan (ZOMIG) 5 MG tablet TAKE ONE TABLET BY MOUTH AT ON SET OF HEADACHE. CAN TAKE ONE MORE TABLET 2 HOURS LATER IF NEEDED. 10 tablet 5  . zonisamide (ZONEGRAN) 100 MG capsule Take 2 capsules (200 mg total) by mouth daily. 60 capsule 11   No current facility-administered medications for this visit.      Allergies  Allergen Reactions  . Bacitra-Neomycin-Polymyxin-Hc Other (See Comments)    Pain and swelling of ear canal    Family History  Problem Relation Age of Onset  . Hypertension Father   . Diabetes Father   . Hypertension Mother   . Cancer Mother        uterine  . Stroke Mother   . Diabetes Maternal Aunt   . Diabetes Maternal Uncle   . Cancer Maternal Grandmother        PANCREATIC  . Hypertension Maternal Grandmother   . Diabetes Maternal Grandmother   . Cancer Maternal Grandfather        LUNG  . Hypertension Maternal Grandfather   . Diabetes Maternal Grandfather   . Cancer Paternal Grandmother        THROAT  . Alzheimer's disease Paternal Grandmother     Social History   Socioeconomic History  . Marital status: Divorced    Spouse name: Not on file  . Number of children: Not on file  . Years of education: Not on file  . Highest education level: Not on file  Occupational History  . Not on file  Social Needs  . Financial resource strain: Not on file  . Food insecurity:    Worry: Not on file    Inability: Not on file  . Transportation needs:    Medical: Not on file  Non-medical: Not on file  Tobacco Use  . Smoking status: Never Smoker  . Smokeless tobacco: Never Used  Substance and Sexual Activity  . Alcohol use: Yes    Alcohol/week: 0.6 oz    Types: 1 Standard drinks or equivalent per week    Comment: occasional (not much)  . Drug use: No  . Sexual activity: Not on file  Lifestyle  . Physical activity:    Days per week: Not on file    Minutes per session: Not on file  . Stress: Not on file  Relationships  . Social connections:    Talks on phone: Not on file    Gets together: Not on file    Attends religious service: Not on file    Active member of club or organization: Not on file    Attends meetings of clubs or organizations: Not on file    Relationship status: Not on file  . Intimate partner violence:    Fear of current or ex  partner: Not on file    Emotionally abused: Not on file    Physically abused: Not on file    Forced sexual activity: Not on file  Other Topics Concern  . Not on file  Social History Narrative  . Not on file     Constitutional: Denies fever, malaise, fatigue, headache or abrupt weight changes.  HEENT: Denies eye pain, eye redness, ear pain, ringing in the ears, wax buildup, runny nose, nasal congestion, bloody nose, or sore throat. Respiratory: Denies difficulty breathing, shortness of breath, cough or sputum production.   Cardiovascular: Denies chest pain, chest tightness, palpitations or swelling in the hands or feet.  Gastrointestinal: Pt reports reflux. Denies abdominal pain, bloating, constipation, diarrhea or blood in the stool.  GU: Denies urgency, frequency, pain with urination, burning sensation, blood in urine, odor or discharge. Musculoskeletal: Pt reports right arm pain (humerus fracture, in sling). Denies decrease in range of motion, difficulty with gait, muscle pain or joint pain and swelling.  Skin: Denies redness, rashes, lesions or ulcercations.  Neurological: Denies dizziness, difficulty with memory, difficulty with speech or problems with balance and coordination.  Psych: Pt reports anxiety. Denies depression, SI/HI.  No other specific complaints in a complete review of systems (except as listed in HPI above).  Objective:   Physical Exam  BP 110/72   Pulse 73   Temp 98.1 F (36.7 C) (Oral)   Ht 5\' 8"  (1.727 m)   Wt 173 lb (78.5 kg)   LMP 07/12/2011   SpO2 98%   BMI 26.30 kg/m  Wt Readings from Last 3 Encounters:  01/05/18 173 lb (78.5 kg)  01/03/18 175 lb (79.4 kg)  12/22/17 176 lb 12.8 oz (80.2 kg)    General: Appears her stated age, well developed, well nourished in NAD. Skin: Warm, dry and intact. Bruising noted over left upper arm. Dermatofibroma noted of left upper arm. HEENT: Head: normal shape and size; Eyes: sclera white, no icterus, conjunctiva  pink, PERRLA and EOMs intact; Ears: Tm's gray and intact, normal light reflex; Throat/Mouth: Teeth present, mucosa pink and moist, no exudate, lesions or ulcerations noted.  Neck:  Neck supple, trachea midline. No masses, lumps or thyromegaly present.  Cardiovascular: Normal rate and rhythm. S1,S2 noted.  No murmur, rubs or gallops noted. No JVD or BLE edema.  Pulmonary/Chest: Normal effort and positive vesicular breath sounds. No respiratory distress. No wheezes, rales or ronchi noted.  Abdomen: Soft and nontender. Normal bowel sounds. No distention or masses  noted. Liver, spleen and kidneys non palpable. Musculoskeletal: Strength 5/5 LUE, 5/5 BLE. Did not test RUE d/t humerus fracture. No difficulty with gait.  Neurological: Alert and oriented. Cranial nerves II-XII grossly intact. Coordination normal.  Psychiatric: Mood and affect normal. Behavior is normal. Judgment and thought content normal.    BMET    Component Value Date/Time   NA 138 05/02/2017 1117   NA 137 05/31/2013 0846   K 3.8 05/02/2017 1117   K 3.8 05/31/2013 0846   CL 102 05/02/2017 1117   CL 106 05/31/2013 0846   CO2 25 05/02/2017 1117   CO2 26 05/31/2013 0846   GLUCOSE 77 05/02/2017 1117   GLUCOSE 93 05/31/2013 0846   BUN 14 05/02/2017 1117   BUN 10 05/31/2013 0846   CREATININE 0.69 05/02/2017 1117   CREATININE 0.76 05/31/2013 0846   CALCIUM 9.3 05/02/2017 1117   CALCIUM 8.2 (L) 05/31/2013 0846   GFRNONAA >60 05/02/2017 1117   GFRNONAA >60 05/31/2013 0846   GFRAA >60 05/02/2017 1117   GFRAA >60 05/31/2013 0846    Lipid Panel     Component Value Date/Time   CHOL 175 01/03/2017 1157   TRIG 116.0 01/03/2017 1157   HDL 65.10 01/03/2017 1157   CHOLHDL 3 01/03/2017 1157   VLDL 23.2 01/03/2017 1157   LDLCALC 86 01/03/2017 1157    CBC    Component Value Date/Time   WBC 9.3 05/02/2017 1117   RBC 4.60 05/02/2017 1117   HGB 14.3 05/02/2017 1117   HGB 13.3 05/31/2013 0846   HCT 42.2 05/02/2017 1117   HCT  39.2 05/31/2013 0846   PLT 225 05/02/2017 1117   PLT 219 05/31/2013 0846   MCV 91.8 05/02/2017 1117   MCV 88 05/31/2013 0846   MCH 31.2 05/02/2017 1117   MCHC 34.0 05/02/2017 1117   RDW 12.8 05/02/2017 1117   RDW 12.8 05/31/2013 0846   LYMPHSABS 1.9 05/20/2016 0423   LYMPHSABS 2.1 05/31/2013 0846   MONOABS 0.6 05/20/2016 0423   MONOABS 0.4 05/31/2013 0846   EOSABS 0.1 05/20/2016 0423   EOSABS 0.1 05/31/2013 0846   BASOSABS 0.0 05/20/2016 0423   BASOSABS 0.1 05/31/2013 0846    Hgb A1C Lab Results  Component Value Date   HGBA1C 6.1 09/02/2015           Assessment & Plan:   Preventative Health Maintenance:  Encouraged her to get a flu shot in the fall Tetanus UTD She does not need pap smears Will start screening mammograms at 6 Encouraged her to consume a balanced diet and exercise regimen Advised her to see an eye doctor and dentist annually Will check CBC, CMET, Lipid, Vit D and B12 today  Right Humerus Fracture:  She is following with sports medicine  RTC in 1 year, sooner if needed

## 2018-01-05 NOTE — Patient Instructions (Signed)

## 2018-01-05 NOTE — Assessment & Plan Note (Signed)
Continue Zomig prn She will continue to follow with [name]

## 2018-01-05 NOTE — Assessment & Plan Note (Signed)
Deteriorated Support offered today Start Sertraline 25 mg daily Try to reduce use of Xanax CSA and UDS today  Update me in 1 month and let me know how you are doing

## 2018-01-05 NOTE — Telephone Encounter (Addendum)
Name of Medication: Alprazolam Name of Pharmacy: [facility] or Written Date and Quantity:  11/28/2017 for #20 with no refills Last Office Visit and Type: 06/27/20191 for CPE Next Office Visit and Type: No future appoinments Last Controlled Substance Agreement Date: 01/05/2018 Last UDS: 01/05/2018

## 2018-01-05 NOTE — Assessment & Plan Note (Signed)
Deteriorated Stop Pantoprazole eRx for Dexilant 30 mg daily Consider referral to GI for upper GI if no improvement

## 2018-01-08 LAB — PAIN MGMT, PROFILE 8 W/CONF, U
6 Acetylmorphine: NEGATIVE ng/mL (ref <10)
ALCOHOL METABOLITES: POSITIVE ng/mL — AB (ref <500)
ALPHAHYDROXYTRIAZOLAM: NEGATIVE ng/mL (ref <50)
AMPHETAMINES: NEGATIVE ng/mL (ref <500)
Alphahydroxyalprazolam: 140 ng/mL — ABNORMAL HIGH (ref <25)
Alphahydroxymidazolam: NEGATIVE ng/mL (ref <50)
Aminoclonazepam: NEGATIVE ng/mL (ref <25)
BENZODIAZEPINES: POSITIVE ng/mL — AB (ref <100)
BUPRENORPHINE, URINE: NEGATIVE ng/mL (ref <5)
Cocaine Metabolite: NEGATIVE ng/mL (ref <150)
Creatinine: 192 mg/dL
ETHYL GLUCURONIDE (ETG): 66538 ng/mL — AB (ref <500)
Ethyl Sulfate (ETS): 16575 ng/mL — ABNORMAL HIGH (ref <100)
HYDROXYETHYLFLURAZEPAM: NEGATIVE ng/mL (ref <50)
Lorazepam: NEGATIVE ng/mL (ref <50)
MARIJUANA METABOLITE: NEGATIVE ng/mL (ref <20)
MDMA: NEGATIVE ng/mL (ref <500)
Nordiazepam: NEGATIVE ng/mL (ref <50)
OXAZEPAM: NEGATIVE ng/mL (ref <50)
OXIDANT: NEGATIVE ug/mL (ref <200)
OXYCODONE: NEGATIVE ng/mL (ref <100)
Opiates: NEGATIVE ng/mL (ref <100)
Temazepam: NEGATIVE ng/mL (ref <50)
pH: 6.69 (ref 4.5–9.0)

## 2018-01-18 ENCOUNTER — Ambulatory Visit: Payer: Federal, State, Local not specified - PPO | Admitting: Family Medicine

## 2018-01-18 ENCOUNTER — Ambulatory Visit: Payer: Self-pay

## 2018-01-18 VITALS — BP 124/68 | Ht 68.0 in | Wt 175.0 lb

## 2018-01-18 DIAGNOSIS — S4991XD Unspecified injury of right shoulder and upper arm, subsequent encounter: Secondary | ICD-10-CM

## 2018-01-18 DIAGNOSIS — M25511 Pain in right shoulder: Secondary | ICD-10-CM

## 2018-01-18 NOTE — Patient Instructions (Signed)
Start physical therapy to be more aggressive with your motion. Your ultrasound is reassuring - the fracture is healing well. Icing 15 minutes at a time 3-4 times a day as needed. Continue arm circles, swings, table slides 3 sets of 10 once or twice a day to help regain your motion. Follow up with me in 3 weeks. If not improving as expected, next step is to go ahead with an MRI.

## 2018-01-22 ENCOUNTER — Encounter: Payer: Self-pay | Admitting: Family Medicine
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
## 2018-01-22 NOTE — Assessment & Plan Note (Signed)
radiographs consistent with nondisplaced greater tuberosity fracture.  Clinically has improved from a pain perspective but struggling to regain her motion.  Full passive ER so no adhesive capsulitis.  Performed and reviewed MSK u/s and noted healing of her fracture and no evidence full thickness rotator cuff tear - some limitation in visualization of entire muscle so couldn't rule out partial tear.  Start physical therapy to be more aggressive in motion and build to strengthening now.  Icing, do HEP.  Voltaren if needed.  F/u in 3 weeks.  If still not improving would go ahead with MRI.

## 2018-01-29 ENCOUNTER — Other Ambulatory Visit: Payer: Self-pay | Admitting: Internal Medicine

## 2018-01-30 ENCOUNTER — Other Ambulatory Visit: Payer: Self-pay | Admitting: Internal Medicine

## 2018-02-06 ENCOUNTER — Other Ambulatory Visit: Payer: Self-pay | Admitting: Internal Medicine

## 2018-02-06 MED ORDER — ALPRAZOLAM 0.25 MG PO TABS: 0.2500 mg | ORAL_TABLET | Freq: Two times a day (BID) | ORAL | 0 refills | Status: DC

## 2018-02-06 NOTE — Telephone Encounter (Signed)
Name of Medication: Xanax 0.25mg  [facility] Last Fill or Written Date and Quantity: last filled 01/05/2018 # 20 Last Office Visit and Type: CPE 12/2017 Next Office Visit and Type: none Last CSA/UDS 01/05/18 completed

## 2018-02-27 ENCOUNTER — Other Ambulatory Visit: Payer: Self-pay | Admitting: Internal Medicine

## 2018-03-09 ENCOUNTER — Other Ambulatory Visit: Payer: Self-pay | Admitting: Internal Medicine

## 2018-03-09 MED ORDER — ALPRAZOLAM 0.25 MG PO TABS: 0.2500 mg | ORAL_TABLET | Freq: Two times a day (BID) | ORAL | 0 refills | Status: DC

## 2018-03-09 NOTE — Telephone Encounter (Signed)
Last filled 02/06/2018... Last OV 12/2017,CSA/UDS 01/05/18 completed Please advise

## 2018-03-23 ENCOUNTER — Ambulatory Visit: Payer: Federal, State, Local not specified - PPO | Admitting: Internal Medicine

## 2018-03-23 DIAGNOSIS — Z0289 Encounter for other administrative examinations: Secondary | ICD-10-CM

## 2018-03-23 NOTE — Progress Notes (Deleted)
HPI  Pt presents to the clinic today with c/o urinary symptoms.   Review of Systems  Past Medical History:  Diagnosis Date  . Anxiety   . Asthma   . GERD (gastroesophageal reflux disease)   . Heart murmur    while pregnant  . Migraine     Family History  Problem Relation Age of Onset  . Hypertension Father   . Diabetes Father   . Hypertension Mother   . Cancer Mother        uterine  . Stroke Mother   . Diabetes Maternal Aunt   . Diabetes Maternal Uncle   . Cancer Maternal Grandmother        PANCREATIC  . Hypertension Maternal Grandmother   . Diabetes Maternal Grandmother   . Cancer Maternal Grandfather        LUNG  . Hypertension Maternal Grandfather   . Diabetes Maternal Grandfather   . Cancer Paternal Grandmother        THROAT  . Alzheimer's disease Paternal Grandmother     Social History   Socioeconomic History  . Marital status: Divorced    Spouse name: Not on file  . Number of children: Not on file  . Years of education: Not on file  . Highest education level: Not on file  Occupational History  . Not on file  Social Needs  . Financial resource strain: Not on file  . Food insecurity:    Worry: Not on file    Inability: Not on file  . Transportation needs:    Medical: Not on file    Non-medical: Not on file  Tobacco Use  . Smoking status: Never Smoker  . Smokeless tobacco: Never Used  Substance and Sexual Activity  . Alcohol use: Yes    Alcohol/week: 1.0 standard drinks    Types: 1 Standard drinks or equivalent per week    Comment: occasional (not much)  . Drug use: No  . Sexual activity: Not on file  Lifestyle  . Physical activity:    Days per week: Not on file    Minutes per session: Not on file  . Stress: Not on file  Relationships  . Social connections:    Talks on phone: Not on file    Gets together: Not on file    Attends religious service: Not on file    Active member of club or organization: Not on file    Attends meetings of  clubs or organizations: Not on file    Relationship status: Not on file  . Intimate partner violence:    Fear of current or ex partner: Not on file    Emotionally abused: Not on file    Physically abused: Not on file    Forced sexual activity: Not on file  Other Topics Concern  . Not on file  Social History Narrative  . Not on file    Allergies  Allergen Reactions  . Bacitra-Neomycin-Polymyxin-Hc Other (See Comments)    Pain and swelling of ear canal     Constitutional: Denies fever, malaise, fatigue, headache or abrupt weight changes.   GU: Pt reports urgency, frequency and pain with urination. Denies burning sensation, blood in urine, odor or discharge. Skin: Denies redness, rashes, lesions or ulcercations.   No other specific complaints in a complete review of systems (except as listed in HPI above).    Objective:   Physical Exam  LMP 07/12/2011  Wt Readings from Last 3 Encounters:  01/18/18 175 lb (79.4 kg)  01/05/18 173 lb (78.5 kg)  01/03/18 175 lb (79.4 kg)    General: Appears her stated age, well developed, well nourished in NAD. Cardiovascular: Normal rate and rhythm. S1,S2 noted.   Pulmonary/Chest: Normal effort and positive vesicular breath sounds. No respiratory distress. No wheezes, rales or ronchi noted.  Abdomen: Soft. Normal bowel sounds. No distention or masses noted.  Tender to palpation over the bladder area. No CVA tenderness.        Assessment & Plan:   Urgency, Frequency, Dysuria secondary to   Urinalysis: Will send urine culture eRx sent if for Macrobid 100 mg BID x 5 days OK to take AZO OTC Drink plenty of fluids  RTC as needed or if symptoms persist. Webb Silversmith, NP

## 2018-04-10 ENCOUNTER — Encounter: Payer: Self-pay | Admitting: Internal Medicine

## 2018-04-10 ENCOUNTER — Other Ambulatory Visit: Payer: Self-pay | Admitting: Internal Medicine

## 2018-04-10 MED ORDER — ACYCLOVIR 5 % EX OINT: 1.0000 "application " | TOPICAL_OINTMENT | CUTANEOUS | 0 refills | Status: DC

## 2018-04-10 MED ORDER — ALPRAZOLAM 0.25 MG PO TABS: 0.2500 mg | ORAL_TABLET | Freq: Two times a day (BID) | ORAL | 0 refills | Status: DC

## 2018-04-10 NOTE — Telephone Encounter (Signed)
Xanax last filled 03/09/18... Please advise

## 2018-05-08 ENCOUNTER — Other Ambulatory Visit: Payer: Self-pay | Admitting: Internal Medicine

## 2018-05-09 MED ORDER — ALPRAZOLAM 0.25 MG PO TABS: 0.2500 mg | ORAL_TABLET | Freq: Two times a day (BID) | ORAL | 0 refills | Status: DC

## 2018-05-09 NOTE — Telephone Encounter (Signed)
Last filled 04/10/2018... Please advise

## 2018-05-15 ENCOUNTER — Encounter: Payer: Self-pay | Admitting: Internal Medicine

## 2018-05-16 ENCOUNTER — Ambulatory Visit: Payer: Federal, State, Local not specified - PPO | Admitting: Internal Medicine

## 2018-05-16 ENCOUNTER — Encounter: Payer: Self-pay | Admitting: Internal Medicine

## 2018-05-16 VITALS — BP 116/80 | HR 81 | Temp 98.2°F | Wt 177.0 lb

## 2018-05-16 DIAGNOSIS — M2540 Effusion, unspecified joint: Secondary | ICD-10-CM

## 2018-05-16 DIAGNOSIS — M2550 Pain in unspecified joint: Secondary | ICD-10-CM

## 2018-05-16 DIAGNOSIS — R6889 Other general symptoms and signs: Secondary | ICD-10-CM

## 2018-05-16 DIAGNOSIS — R5383 Other fatigue: Secondary | ICD-10-CM

## 2018-05-16 DIAGNOSIS — Z9109 Other allergy status, other than to drugs and biological substances: Secondary | ICD-10-CM

## 2018-05-16 DIAGNOSIS — R21 Rash and other nonspecific skin eruption: Secondary | ICD-10-CM

## 2018-05-16 LAB — VITAMIN D 25 HYDROXY (VIT D DEFICIENCY, FRACTURES): VITD: 21.3 ng/mL — AB (ref 30.00–100.00)

## 2018-05-16 LAB — HIGH SENSITIVITY CRP: CRP, High Sensitivity: 0.61 mg/L (ref 0.000–5.000)

## 2018-05-16 LAB — SEDIMENTATION RATE: SED RATE: 8 mm/h (ref 0–20)

## 2018-05-16 LAB — TSH: TSH: 1.29 u[IU]/mL (ref 0.35–4.50)

## 2018-05-16 MED ORDER — PANTOPRAZOLE SODIUM 40 MG PO TBEC: 40.0000 mg | DELAYED_RELEASE_TABLET | Freq: Every day | ORAL | 1 refills | Status: DC

## 2018-05-18 ENCOUNTER — Encounter: Payer: Self-pay | Admitting: Internal Medicine

## 2018-05-18 ENCOUNTER — Other Ambulatory Visit: Payer: Self-pay | Admitting: Internal Medicine

## 2018-05-18 DIAGNOSIS — E559 Vitamin D deficiency, unspecified: Secondary | ICD-10-CM

## 2018-05-18 DIAGNOSIS — R768 Other specified abnormal immunological findings in serum: Secondary | ICD-10-CM

## 2018-05-18 LAB — BETA-2 GLYCOPROTEIN ANTIBODIES
Beta-2 Glyco 1 IgM: <9 SMU (ref <=20)
Beta-2 Glyco I IgG: <9 SGU (ref <=20)

## 2018-05-18 LAB — ANA: Anti Nuclear Antibody(ANA): POSITIVE — AB

## 2018-05-18 LAB — B. BURGDORFI ANTIBODIES BY WB
B burgdorferi IgG Abs (IB): NEGATIVE
B burgdorferi IgM Abs (IB): NEGATIVE
LYME DISEASE 39 KD IGM: REACTIVE — AB
LYME DISEASE 41 KD IGG: REACTIVE — AB
LYME DISEASE 45 KD IGG: NONREACTIVE
LYME DISEASE 58 KD IGG: NONREACTIVE
LYME DISEASE 93 KD IGG: NONREACTIVE
Lyme Disease 18 kD IgG: REACTIVE — AB
Lyme Disease 23 kD IgG: REACTIVE — AB
Lyme Disease 23 kD IgM: NONREACTIVE
Lyme Disease 28 kD IgG: NONREACTIVE
Lyme Disease 30 kD IgG: NONREACTIVE
Lyme Disease 39 kD IgG: NONREACTIVE
Lyme Disease 41 kD IgM: NONREACTIVE
Lyme Disease 66 kD IgG: NONREACTIVE

## 2018-05-18 LAB — CARDIOLIPIN ANTIBODIES, IGG, IGM, IGA
Anticardiolipin IgA: <11 [APL'U]
Anticardiolipin IgM: <12 [MPL'U]

## 2018-05-18 LAB — ANTI-NUCLEAR AB-TITER (ANA TITER): ANA Titer 1: 1:80 {titer} — ABNORMAL HIGH

## 2018-05-18 LAB — C3 COMPLEMENT: C3 COMPLEMENT: 158 mg/dL (ref 83–193)

## 2018-05-18 LAB — C4 COMPLEMENT: C4 Complement: 29 mg/dL (ref 15–57)

## 2018-05-18 LAB — ANTI-DNA ANTIBODY, DOUBLE-STRANDED

## 2018-05-18 LAB — RHEUMATOID FACTOR

## 2018-05-18 MED ORDER — VITAMIN D (ERGOCALCIFEROL) 1.25 MG (50000 UNIT) PO CAPS: 50000.0000 [IU] | ORAL_CAPSULE | ORAL | 0 refills | Status: DC

## 2018-05-18 NOTE — Patient Instructions (Signed)
Antinuclear Antibody Test Why am I having this test? This is a test used to help diagnose systemic lupus erythematosus (SLE) and other autoimmune diseases. An autoimmune disease is a disease in which the body's own defense system (immune system) attacks the body. This test checks for antinuclear antibodies (ANA) in the blood. The presence of ANA is associated with several autoimmune diseases. It is most commonly seen with SLE. What kind of sample is taken? A blood sample is required for this test. It is usually collected by inserting a needle into a vein. How do I prepare for this test? There is no preparation required for this test. How are the results reported? Your test results will be reported as either positive or negative. It is your responsibility to obtain your test results. Ask the lab or department performing the test when and how you will get your results. What do the results mean? A positive test may mean you have:  SLE.  An autoimmune disease.  Liver dysfunction.  Leukemia.  Infectious mononucleosis.  Talk with your health care provider to discuss your results, treatment options, and if necessary, the need for more tests. Talk with your health care provider if you have any questions about your results. Talk with your health care provider to discuss your results, treatment options, and if necessary, the need for more tests. Talk with your health care provider if you have any questions about your results. This information is not intended to replace advice given to you by your health care provider. Make sure you discuss any questions you have with your health care provider. Document Released: 07/20/2004 Document Revised: 03/01/2016 Document Reviewed: 11/27/2013 Elsevier Interactive Patient Education  [employer].

## 2018-05-18 NOTE — Progress Notes (Signed)
Subjective:    Patient ID: Linda [name], female    DOB: [birth year], 42 y.o.   MRN: [id number]  HPI  Patient presents to the clinic today with complaint of rash on the face.  She has noticed this intermittently over the last 2 to 3 months.  She reports the rash is raised, and covers nose and cheeks.  She has not noticed a rash on any other part of her body.  She reports she has become extremely sensitive to sunlight.  She also reports fatigue, cold intolerance, joint pain in shoulders, hands and wrist associated with swelling.  She reports all the symptoms seem to occur together.  The symptoms can go and she has no idea what triggers them.  She has tried Aleve and Benadryl with minimal relief.  She denies recent changes in medications or diet.  She denies family history of autoimmune disease.  Does have a family history of thyroid disease.  Review of Systems      Past Medical History:  Diagnosis Date  . Anxiety   . Asthma   . GERD (gastroesophageal reflux disease)   . Heart murmur    while pregnant  . Migraine     Current Outpatient Medications  Medication Sig Dispense Refill  . acyclovir ointment (ZOVIRAX) 5 % Apply 1 application topically every 3 (three) hours. 15 g 0  . ALPRAZolam (XANAX) 0.25 MG tablet Take 1 tablet (0.25 mg total) by mouth 2 (two) times daily. 20 tablet 0  . pantoprazole (PROTONIX) 40 MG tablet Take 1 tablet (40 mg total) by mouth daily. 90 tablet 1  . sertraline (ZOLOFT) 25 MG tablet TAKE 1 TABLET BY MOUTH EVERY DAY 90 tablet 2  . zolmitriptan (ZOMIG) 5 MG tablet TAKE ONE TABLET BY MOUTH AT ON SET OF HEADACHE. CAN TAKE ONE MORE TABLET 2 HOURS LATER IF NEEDED. 10 tablet 5  . zonisamide (ZONEGRAN) 100 MG capsule Take 2 capsules (200 mg total) by mouth daily. 60 capsule 11  . Vitamin D, Ergocalciferol, (DRISDOL) 1.25 MG (50000 UT) CAPS capsule Take 1 capsule (50,000 Units total) by mouth every 7 (seven) days. 12 capsule 0   No current facility-administered  medications for this visit.     Allergies  Allergen Reactions  . Bacitra-Neomycin-Polymyxin-Hc Other (See Comments)    Pain and swelling of ear canal    Family History  Problem Relation Age of Onset  . Hypertension Father   . Diabetes Father   . Hypertension Mother   . Cancer Mother        uterine  . Stroke Mother   . Diabetes Maternal Aunt   . Diabetes Maternal Uncle   . Cancer Maternal Grandmother        PANCREATIC  . Hypertension Maternal Grandmother   . Diabetes Maternal Grandmother   . Cancer Maternal Grandfather        LUNG  . Hypertension Maternal Grandfather   . Diabetes Maternal Grandfather   . Cancer Paternal Grandmother        THROAT  . Alzheimer's disease Paternal Grandmother     Social History   Socioeconomic History  . Marital status: Divorced    Spouse name: Not on file  . Number of children: Not on file  . Years of education: Not on file  . Highest education level: Not on file  Occupational History  . Not on file  Social Needs  . Financial resource strain: Not on file  . Food insecurity:    Worry:  Not on file    Inability: Not on file  . Transportation needs:    Medical: Not on file    Non-medical: Not on file  Tobacco Use  . Smoking status: Never Smoker  . Smokeless tobacco: Never Used  Substance and Sexual Activity  . Alcohol use: Yes    Alcohol/week: 1.0 standard drinks    Types: 1 Standard drinks or equivalent per week    Comment: occasional (not much)  . Drug use: No  . Sexual activity: Not on file  Lifestyle  . Physical activity:    Days per week: Not on file    Minutes per session: Not on file  . Stress: Not on file  Relationships  . Social connections:    Talks on phone: Not on file    Gets together: Not on file    Attends religious service: Not on file    Active member of club or organization: Not on file    Attends meetings of clubs or organizations: Not on file    Relationship status: Not on file  . Intimate partner  violence:    Fear of current or ex partner: Not on file    Emotionally abused: Not on file    Physically abused: Not on file    Forced sexual activity: Not on file  Other Topics Concern  . Not on file  Social History Narrative  . Not on file     Constitutional: Pt reports fatigue, cold intolerance, sensitivity to sunlight. Denies fever, malaise, headache or abrupt weight changes.  HEENT: Denies eye pain, eye redness, ear pain, ringing in the ears, wax buildup, runny nose, nasal congestion, bloody nose, or sore throat. Respiratory: Denies difficulty breathing, shortness of breath, cough or sputum production.   Cardiovascular: Denies chest pain, chest tightness, palpitations or swelling in the hands or feet.  Gastrointestinal: Denies abdominal pain, bloating, constipation, diarrhea or blood in the stool.  GU: Denies urgency, frequency, pain with urination, burning sensation, blood in urine, odor or discharge. Musculoskeletal: Pt reports joint pain and swelling. Denies decrease in range of motion, difficulty with gait, muscle pain.  Skin: Pt reports rash of face. Denies ulcercations.  Neurological: Denies dizziness, difficulty with memory, difficulty with speech or problems with balance and coordination.    No other specific complaints in a complete review of systems (except as listed in HPI above).  Objective:   Physical Exam   BP 116/80   Pulse 81   Temp 98.2 F (36.8 C) (Oral)   Wt 177 lb (80.3 kg)   LMP 07/12/2011   SpO2 98%   BMI 26.91 kg/m  Wt Readings from Last 3 Encounters:  05/16/18 177 lb (80.3 kg)  01/18/18 175 lb (79.4 kg)  01/05/18 173 lb (78.5 kg)    General: Appears her stated age, in NAD. Skin: Warm, dry and intact. No rashes noted today but she does have pictures of this on her phone. Neck:  Neck supple, trachea midline. No masses, lumps or thyromegaly present.  Cardiovascular: Normal rate and rhythm. S1,S2 noted.  No murmur, rubs or gallops noted.    Pulmonary/Chest: Normal effort and positive vesicular breath sounds. No respiratory distress. No wheezes, rales or ronchi noted.  Musculoskeletal: Normal range of motion of shoulders, wrist and fingers.  Strength 5/5 BUE.  Hand grips equal. Neurological: Alert and oriented. Coordination normal.  Psychiatric: She is slightly anxious appearing today.  BMET    Component Value Date/Time   NA 138 01/05/2018 1457  NA 137 05/31/2013 0846   K 3.9 01/05/2018 1457   K 3.8 05/31/2013 0846   CL 104 01/05/2018 1457   CL 106 05/31/2013 0846   CO2 29 01/05/2018 1457   CO2 26 05/31/2013 0846   GLUCOSE 94 01/05/2018 1457   GLUCOSE 93 05/31/2013 0846   BUN 16 01/05/2018 1457   BUN 10 05/31/2013 0846   CREATININE 0.84 01/05/2018 1457   CREATININE 0.76 05/31/2013 0846   CALCIUM 9.1 01/05/2018 1457   CALCIUM 8.2 (L) 05/31/2013 0846   GFRNONAA >60 05/02/2017 1117   GFRNONAA >60 05/31/2013 0846   GFRAA >60 05/02/2017 1117   GFRAA >60 05/31/2013 0846    Lipid Panel     Component Value Date/Time   CHOL 194 01/05/2018 1457   TRIG 202.0 (H) 01/05/2018 1457   HDL 84.20 01/05/2018 1457   CHOLHDL 2 01/05/2018 1457   VLDL 40.4 (H) 01/05/2018 1457   LDLCALC 86 01/03/2017 1157    CBC    Component Value Date/Time   WBC 8.7 01/05/2018 1457   RBC 4.26 01/05/2018 1457   HGB 13.4 01/05/2018 1457   HGB 13.3 05/31/2013 0846   HCT 39.6 01/05/2018 1457   HCT 39.2 05/31/2013 0846   PLT 270.0 01/05/2018 1457   PLT 219 05/31/2013 0846   MCV 93.0 01/05/2018 1457   MCV 88 05/31/2013 0846   MCH 31.2 05/02/2017 1117   MCHC 33.8 01/05/2018 1457   RDW 12.5 01/05/2018 1457   RDW 12.8 05/31/2013 0846   LYMPHSABS 1.9 05/20/2016 0423   LYMPHSABS 2.1 05/31/2013 0846   MONOABS 0.6 05/20/2016 0423   MONOABS 0.4 05/31/2013 0846   EOSABS 0.1 05/20/2016 0423   EOSABS 0.1 05/31/2013 0846   BASOSABS 0.0 05/20/2016 0423   BASOSABS 0.1 05/31/2013 0846    Hgb A1C Lab Results  Component Value Date   HGBA1C  6.1 09/02/2015          Assessment & Plan:   Fatigue, Cold Intolerance, Sensitivity to Light, Rash of Face, Multiple Joint Pain and Swelling:  Concerning for autoimmune disorder like lupus Will check TSH, Vit D, ANA, ESR, CRP, RF, Anti dS DNA, C3, C4, beta 2 glycoprotein, cardioliptin profile, B Burdorferi antibodies May need referral to rheumatology for further evaluation  Will follow up after labs, return precautions discussed. Webb Silversmith, NP

## 2018-06-02 ENCOUNTER — Telehealth: Payer: Self-pay | Admitting: Neurology

## 2018-06-02 MED ORDER — ZOLMITRIPTAN 5 MG PO TABS: 5.0000 mg | ORAL_TABLET | ORAL | 3 refills | Status: DC | PRN

## 2018-06-02 NOTE — Telephone Encounter (Signed)
Called and spoke with Pt, went over how to take Zomig, she needs a refill, I advised her will send in. Went over supplements that may be helpful, advised to hydrate and to get enough sleep.

## 2018-06-02 NOTE — Telephone Encounter (Signed)
Patient called needing to schedule an appointment as well as speak with you regarding her Zomig medication and it not working. Please Call. Thanks

## 2018-06-05 DIAGNOSIS — M79643 Pain in unspecified hand: Secondary | ICD-10-CM | POA: Diagnosis not present

## 2018-06-05 DIAGNOSIS — Z1383 Encounter for screening for respiratory disorder NEC: Secondary | ICD-10-CM | POA: Diagnosis not present

## 2018-06-05 DIAGNOSIS — M25579 Pain in unspecified ankle and joints of unspecified foot: Secondary | ICD-10-CM | POA: Diagnosis not present

## 2018-06-05 DIAGNOSIS — R768 Other specified abnormal immunological findings in serum: Secondary | ICD-10-CM | POA: Diagnosis not present

## 2018-06-05 DIAGNOSIS — M79642 Pain in left hand: Secondary | ICD-10-CM | POA: Diagnosis not present

## 2018-06-05 DIAGNOSIS — M79641 Pain in right hand: Secondary | ICD-10-CM | POA: Diagnosis not present

## 2018-06-05 DIAGNOSIS — M79673 Pain in unspecified foot: Secondary | ICD-10-CM | POA: Diagnosis not present

## 2018-06-05 DIAGNOSIS — M79671 Pain in right foot: Secondary | ICD-10-CM | POA: Diagnosis not present

## 2018-06-05 DIAGNOSIS — M899 Disorder of bone, unspecified: Secondary | ICD-10-CM | POA: Diagnosis not present

## 2018-06-05 DIAGNOSIS — M79672 Pain in left foot: Secondary | ICD-10-CM | POA: Diagnosis not present

## 2018-06-10 ENCOUNTER — Other Ambulatory Visit: Payer: Self-pay | Admitting: Internal Medicine

## 2018-06-13 MED ORDER — ALPRAZOLAM 0.25 MG PO TABS: 0.2500 mg | ORAL_TABLET | Freq: Two times a day (BID) | ORAL | 0 refills | Status: DC

## 2018-06-13 NOTE — Telephone Encounter (Signed)
Last filled 05/09/2018... Please advise

## 2018-06-14 NOTE — Progress Notes (Deleted)
NEUROLOGY FOLLOW UP OFFICE NOTE  Linda [name] [id number]  HISTORY OF PRESENT ILLNESS: Linda [name] is a 42 year old right-handed female with migraine, panic attacks and history of hysterectomy who follows up for migraines.  UPDATE: Zomig no longer working well.   Intensity:  *** Duration:  *** Frequency:  *** Frequency of abortive medication: *** Current NSAIDS:  None Current analgesics:  None Current triptans:  Zomig 5mg  tablet Current ergotamine:  None Current anti-emetic:  None Current muscle relaxants:  None Current anti-anxiolytic:  alprazolam Current sleep aide:  None Current Antihypertensive medications:  None Current Antidepressant medications:  None Current Anticonvulsant medications:  zonisamide 200mg  Current anti-CGRP:  None Current Vitamins/Herbal/Supplements:  MVI Current Antihistamines/Decongestants:  None Other therapy:  None Hormone/birth control:  ***  Caffeine:  Tea every other day Alcohol:  Rarely Smoker:  No Diet:  Poor.  Stays hydrated. Exercise:  No Depression:  No; Anxiety:  yes Other pain:  No Sleep hygiene:  Poor  HISTORY: Onset:  Around age 16 Location: Varies. Either temple, bi-frontal, sometimes radiating down the right side of neck Quality: Migraines are severe hammer. Daily headaches are dull throbbing Initial Intensity: Migraines are 10/10. Daily headaches 3/10; November: 6-10/10 Aura: no Prodrome: no Associated symptoms:  Nausea, phonophobia, osmophobia, sometimes sees spots, sometimes vomiting.  Initial Duration: 3-5 days; November: brief with Zomig Initial Frequency: Once a month (but has daily headache); November: 3 days per month Triggers:  wine, hunger, scents Relieving factors: none Activity: Cannot function with migraines  Since October 2018, she has had primary stabbing headaches.  Usually left frontal region which may radiate down side of her neck, lasting few seconds.  Initially occurring every week  to every other week.  No specific triggers.  Past NSAIDS:  Ibuprofen, naproxen Past analgesics:  Fioricet, Midrin, Excedrin Past triptans:  Maxalt, Relpax, sumatriptan tablet (caused head burning Past ergots:  None Past anti-emetics:  *** Past anti-hypertensives:  None Past antidepressants:  None Past anti-epileptics:  Topiramate (side effects) Past anti-CGRP:  None Past vitamins/supplements:  None Other past therapy:  None  Family history of headache: no  She had a CT of the head performed on 09/08/06 for headache, which was reviewed, which was unremarkable. It did reveal a punctate calcification along the right transverse sinus, within normal limits, and is showed some fluid in the mastoid air cells bilaterally.  PAST MEDICAL HISTORY: Past Medical History:  Diagnosis Date  . Anxiety   . Asthma   . GERD (gastroesophageal reflux disease)   . Heart murmur    while pregnant  . Migraine     MEDICATIONS: Current Outpatient Medications on File Prior to Visit  Medication Sig Dispense Refill  . acyclovir ointment (ZOVIRAX) 5 % Apply 1 application topically every 3 (three) hours. 15 g 0  . ALPRAZolam (XANAX) 0.25 MG tablet Take 1 tablet (0.25 mg total) by mouth 2 (two) times daily. 20 tablet 0  . pantoprazole (PROTONIX) 40 MG tablet Take 1 tablet (40 mg total) by mouth daily. 90 tablet 1  . sertraline (ZOLOFT) 25 MG tablet TAKE 1 TABLET BY MOUTH EVERY DAY 90 tablet 2  . Vitamin D, Ergocalciferol, (DRISDOL) 1.25 MG (50000 UT) CAPS capsule Take 1 capsule (50,000 Units total) by mouth every 7 (seven) days. 12 capsule 0  . zolmitriptan (ZOMIG) 5 MG tablet TAKE ONE TABLET BY MOUTH AT ON SET OF HEADACHE. CAN TAKE ONE MORE TABLET 2 HOURS LATER IF NEEDED. 10 tablet 5  . zolmitriptan (ZOMIG) 5  MG tablet Take 1 tablet (5 mg total) by mouth as needed for migraine. 10 tablet 3  . zonisamide (ZONEGRAN) 100 MG capsule Take 2 capsules (200 mg total) by mouth daily. 60 capsule 11   No current  facility-administered medications on file prior to visit.     ALLERGIES: Allergies  Allergen Reactions  . Bacitra-Neomycin-Polymyxin-Hc Other (See Comments)    Pain and swelling of ear canal    FAMILY HISTORY: Family History  Problem Relation Age of Onset  . Hypertension Father   . Diabetes Father   . Hypertension Mother   . Cancer Mother        uterine  . Stroke Mother   . Diabetes Maternal Aunt   . Diabetes Maternal Uncle   . Cancer Maternal Grandmother        PANCREATIC  . Hypertension Maternal Grandmother   . Diabetes Maternal Grandmother   . Cancer Maternal Grandfather        LUNG  . Hypertension Maternal Grandfather   . Diabetes Maternal Grandfather   . Cancer Paternal Grandmother        THROAT  . Alzheimer's disease Paternal Grandmother    ***.  SOCIAL HISTORY: Social History   Socioeconomic History  . Marital status: Divorced    Spouse name: Not on file  . Number of children: Not on file  . Years of education: Not on file  . Highest education level: Not on file  Occupational History  . Not on file  Social Needs  . Financial resource strain: Not on file  . Food insecurity:    Worry: Not on file    Inability: Not on file  . Transportation needs:    Medical: Not on file    Non-medical: Not on file  Tobacco Use  . Smoking status: Never Smoker  . Smokeless tobacco: Never Used  Substance and Sexual Activity  . Alcohol use: Yes    Alcohol/week: 1.0 standard drinks    Types: 1 Standard drinks or equivalent per week    Comment: occasional (not much)  . Drug use: No  . Sexual activity: Not on file  Lifestyle  . Physical activity:    Days per week: Not on file    Minutes per session: Not on file  . Stress: Not on file  Relationships  . Social connections:    Talks on phone: Not on file    Gets together: Not on file    Attends religious service: Not on file    Active member of club or organization: Not on file    Attends meetings of clubs or  organizations: Not on file    Relationship status: Not on file  . Intimate partner violence:    Fear of current or ex partner: Not on file    Emotionally abused: Not on file    Physically abused: Not on file    Forced sexual activity: Not on file  Other Topics Concern  . Not on file  Social History Narrative  . Not on file    REVIEW OF SYSTEMS: Constitutional: No fevers, chills, or sweats, no generalized fatigue, change in appetite Eyes: No visual changes, double vision, eye pain Ear, nose and throat: No hearing loss, ear pain, nasal congestion, sore throat Cardiovascular: No chest pain, palpitations Respiratory:  No shortness of breath at rest or with exertion, wheezes GastrointestinaI: No nausea, vomiting, diarrhea, abdominal pain, fecal incontinence Genitourinary:  No dysuria, urinary retention or frequency Musculoskeletal:  No neck pain, back pain  Integumentary: No rash, pruritus, skin lesions Neurological: as above Psychiatric: No depression, insomnia, anxiety Endocrine: No palpitations, fatigue, diaphoresis, mood swings, change in appetite, change in weight, increased thirst Hematologic/Lymphatic:  No purpura, petechiae. Allergic/Immunologic: no itchy/runny eyes, nasal congestion, recent allergic reactions, rashes  PHYSICAL EXAM: *** General: No acute distress.  Patient appears ***-groomed.  *** body habitus. Head:  Normocephalic/atraumatic Eyes:  Fundi examined but not visualized Neck: supple, no paraspinal tenderness, full range of motion Heart:  Regular rate and rhythm Lungs:  Clear to auscultation bilaterally Back: No paraspinal tenderness Neurological Exam: alert and oriented to person, place, and time. Attention span and concentration intact, recent and remote memory intact, fund of knowledge intact.  Speech fluent and not dysarthric, language intact.  CN II-XII intact. Bulk and tone normal, muscle strength 5/5 throughout.  Sensation to light touch, temperature and  vibration intact.  Deep tendon reflexes 2+ throughout, toes downgoing.  Finger to nose and heel to shin testing intact.  Gait normal, Romberg negative.  IMPRESSION: Migraine without aura, without status migrainosus, not intractable Primary stabbing headache  PLAN: ***  Metta Clines, DO  CC: ***

## 2018-06-15 ENCOUNTER — Ambulatory Visit: Payer: Federal, State, Local not specified - PPO | Admitting: Neurology

## 2018-06-15 ENCOUNTER — Encounter: Payer: Self-pay | Admitting: Internal Medicine

## 2018-06-16 MED ORDER — ALPRAZOLAM 0.25 MG PO TABS: 0.2500 mg | ORAL_TABLET | Freq: Two times a day (BID) | ORAL | 0 refills | Status: DC

## 2018-06-16 NOTE — Telephone Encounter (Signed)
Spoke to pt and she states that [facility] is out of the 0.25[facility]  X[facility]ax and had to call around to other pharmacies to see if they had it in stock as [facility] are out within a 2 hour drive range. Pt states [facility] on [address] has med in stock and would like Rx sent there. I called [facility] [address] and spoke to [place] she did state they are out of 0.25mg  Xanax tab I asked that they d/c Rx and Rx was called into [facility] as they are not open yet per pt's instruction

## 2018-06-28 ENCOUNTER — Ambulatory Visit: Payer: Federal, State, Local not specified - PPO | Admitting: Internal Medicine

## 2018-06-28 NOTE — Progress Notes (Deleted)
NEUROLOGY FOLLOW UP OFFICE NOTE  Linda [name] [id number]  HISTORY OF PRESENT ILLNESS: Linda [name] is a 42 year old right-handed woman who follows up for migraines.  UPDATE: Intensity:  *** Duration:  *** Frequency:  *** Frequency of abortive medication: *** Current NSAIDS:  *** Current analgesics:  *** Current triptans: Zomig 5 mg Current ergotamine: None Current anti-emetic: None Current muscle relaxants: None Current anti-anxiolytic: Alprazolam Current sleep aide: None Current Antihypertensive medications: None Current Antidepressant medications: None Current Anticonvulsant medications: Zonisamide 200 mg daily Current anti-CGRP: None Current Vitamins/Herbal/Supplements: T vitamin Current Antihistamines/Decongestants: None Other therapy: None Hormone/birth control: None  Caffeine: Tea every other day Alcohol: Rarely Smoker: No Diet: Stays hydrated Exercise: No Depression: No; Anxiety: Yes Other pain:  *** Sleep hygiene: Poor  HISTORY: Onset: Around age 70 Location: Varies. Either temple, bi-frontal, sometimes radiating down the right side of neck Quality: Migraines are severe hammer. Daily headaches are dull throbbing Initial Intensity: Migraines are 10/10. Daily headaches 3/10; November: 6-10/10 Aura: no Prodrome: no Associated symptoms: Nausea, phonophobia, osmophobia, sometimes sees spots, sometimes vomiting.  Initial Duration: 3-5 days; November: brief with Zomig Initial Frequency: Once a month (but has daily headache); November: 3 days per month Triggers: Wine, hunger, scents Relieving factors: none Activity: Cannot function with migraines  In October 2018 she began having brief stabbing headaches usually in the left frontal region.  Past NSAIDs: Ibuprofen, naproxen Past analgesics: Fioricet, Excedrin, Midrin Past triptans: Maxalt, Relpax, sumatriptan (caused head burning) Past antiepileptic medication: Topamax (side  effects)  Family history of headache: no  She had a CT of the head performed on 09/08/06 for headache, which was reviewed, which was unremarkable. It did reveal a punctate calcification along the right transverse sinus, within normal limits, and is showed some fluid in the mastoid air cells bilaterally.  PAST MEDICAL HISTORY: Past Medical History:  Diagnosis Date  . Anxiety   . Asthma   . GERD (gastroesophageal reflux disease)   . Heart murmur    while pregnant  . Migraine     MEDICATIONS: Current Outpatient Medications on File Prior to Visit  Medication Sig Dispense Refill  . acyclovir ointment (ZOVIRAX) 5 % Apply 1 application topically every 3 (three) hours. 15 g 0  . ALPRAZolam (XANAX) 0.25 MG tablet Take 1 tablet (0.25 mg total) by mouth 2 (two) times daily. 20 tablet 0  . pantoprazole (PROTONIX) 40 MG tablet Take 1 tablet (40 mg total) by mouth daily. 90 tablet 1  . sertraline (ZOLOFT) 25 MG tablet TAKE 1 TABLET BY MOUTH EVERY DAY 90 tablet 2  . Vitamin D, Ergocalciferol, (DRISDOL) 1.25 MG (50000 UT) CAPS capsule Take 1 capsule (50,000 Units total) by mouth every 7 (seven) days. 12 capsule 0  . zolmitriptan (ZOMIG) 5 MG tablet TAKE ONE TABLET BY MOUTH AT ON SET OF HEADACHE. CAN TAKE ONE MORE TABLET 2 HOURS LATER IF NEEDED. 10 tablet 5  . zolmitriptan (ZOMIG) 5 MG tablet Take 1 tablet (5 mg total) by mouth as needed for migraine. 10 tablet 3  . zonisamide (ZONEGRAN) 100 MG capsule Take 2 capsules (200 mg total) by mouth daily. 60 capsule 11   No current facility-administered medications on file prior to visit.     ALLERGIES: Allergies  Allergen Reactions  . Bacitra-Neomycin-Polymyxin-Hc Other (See Comments)    Pain and swelling of ear canal    FAMILY HISTORY: Family History  Problem Relation Age of Onset  . Hypertension Father   . Diabetes Father   . Hypertension  Mother   . Cancer Mother        uterine  . Stroke Mother   . Diabetes Maternal Aunt   . Diabetes  Maternal Uncle   . Cancer Maternal Grandmother        PANCREATIC  . Hypertension Maternal Grandmother   . Diabetes Maternal Grandmother   . Cancer Maternal Grandfather        LUNG  . Hypertension Maternal Grandfather   . Diabetes Maternal Grandfather   . Cancer Paternal Grandmother        THROAT  . Alzheimer's disease Paternal Grandmother    ***.  SOCIAL HISTORY: Social History   Socioeconomic History  . Marital status: Divorced    Spouse name: Not on file  . Number of children: Not on file  . Years of education: Not on file  . Highest education level: Not on file  Occupational History  . Not on file  Social Needs  . Financial resource strain: Not on file  . Food insecurity:    Worry: Not on file    Inability: Not on file  . Transportation needs:    Medical: Not on file    Non-medical: Not on file  Tobacco Use  . Smoking status: Never Smoker  . Smokeless tobacco: Never Used  Substance and Sexual Activity  . Alcohol use: Yes    Alcohol/week: 1.0 standard drinks    Types: 1 Standard drinks or equivalent per week    Comment: occasional (not much)  . Drug use: No  . Sexual activity: Not on file  Lifestyle  . Physical activity:    Days per week: Not on file    Minutes per session: Not on file  . Stress: Not on file  Relationships  . Social connections:    Talks on phone: Not on file    Gets together: Not on file    Attends religious service: Not on file    Active member of club or organization: Not on file    Attends meetings of clubs or organizations: Not on file    Relationship status: Not on file  . Intimate partner violence:    Fear of current or ex partner: Not on file    Emotionally abused: Not on file    Physically abused: Not on file    Forced sexual activity: Not on file  Other Topics Concern  . Not on file  Social History Narrative  . Not on file    REVIEW OF SYSTEMS: Constitutional: No fevers, chills, or sweats, no generalized fatigue, change  in appetite Eyes: No visual changes, double vision, eye pain Ear, nose and throat: No hearing loss, ear pain, nasal congestion, sore throat Cardiovascular: No chest pain, palpitations Respiratory:  No shortness of breath at rest or with exertion, wheezes GastrointestinaI: No nausea, vomiting, diarrhea, abdominal pain, fecal incontinence Genitourinary:  No dysuria, urinary retention or frequency Musculoskeletal:  No neck pain, back pain Integumentary: No rash, pruritus, skin lesions Neurological: as above Psychiatric: No depression, insomnia, anxiety Endocrine: No palpitations, fatigue, diaphoresis, mood swings, change in appetite, change in weight, increased thirst Hematologic/Lymphatic:  No purpura, petechiae. Allergic/Immunologic: no itchy/runny eyes, nasal congestion, recent allergic reactions, rashes  PHYSICAL EXAM: *** General: No acute distress.  Patient appears ***-groomed.  *** body habitus. Head:  Normocephalic/atraumatic Eyes:  Fundi examined but not visualized Neck: supple, no paraspinal tenderness, full range of motion Heart:  Regular rate and rhythm Lungs:  Clear to auscultation bilaterally Back: No paraspinal tenderness Neurological Exam:  alert and oriented to person, place, and time. Attention span and concentration intact, recent and remote memory intact, fund of knowledge intact.  Speech fluent and not dysarthric, language intact.  CN II-XII intact. Bulk and tone normal, muscle strength 5/5 throughout.  Sensation to light touch, temperature and vibration intact.  Deep tendon reflexes 2+ throughout, toes downgoing.  Finger to nose and heel to shin testing intact.  Gait normal, Romberg negative.  IMPRESSION: ***  PLAN: ***  Metta Clines, DO  CC: ***

## 2018-06-29 ENCOUNTER — Encounter: Payer: Self-pay | Admitting: Neurology

## 2018-06-29 ENCOUNTER — Ambulatory Visit: Payer: Federal, State, Local not specified - PPO | Admitting: Neurology

## 2018-06-29 DIAGNOSIS — Z029 Encounter for administrative examinations, unspecified: Secondary | ICD-10-CM

## 2018-06-30 ENCOUNTER — Telehealth: Payer: Self-pay | Admitting: Neurology

## 2018-06-30 NOTE — Telephone Encounter (Signed)
Patient dismissed from [facility] by Metta Clines DO, effective 06/29/18. Dismissal letter sent out by 1st class mail. LM

## 2018-07-18 ENCOUNTER — Other Ambulatory Visit: Payer: Self-pay | Admitting: Internal Medicine

## 2018-07-18 MED ORDER — ALPRAZOLAM 0.25 MG PO TABS: 0.2500 mg | ORAL_TABLET | Freq: Two times a day (BID) | ORAL | 0 refills | Status: DC

## 2018-07-18 NOTE — Addendum Note (Signed)
Addended by: Jearld Fenton on: 07/18/2018 10:32 AM   Modules accepted: Orders

## 2018-07-18 NOTE — Telephone Encounter (Signed)
Name of Medication: Alprazolam 0.25 mg Name of Pharmacy: [facility] or Written Date and Quantity: 06/16/18, #20/0 Last Office Visit and Type: 05/16/18, f/u Next Office Visit and Type: none Last Controlled Substance Agreement Date: 03/16/16 Last UDS: 03/16/16

## 2018-08-15 ENCOUNTER — Other Ambulatory Visit: Payer: Self-pay | Admitting: Internal Medicine

## 2018-08-15 NOTE — Telephone Encounter (Signed)
Last filled 07/18/18, note to pharmacy [facility] on or after 08/17/2018... please advise

## 2018-08-22 ENCOUNTER — Encounter: Payer: Self-pay | Admitting: Internal Medicine

## 2018-08-22 ENCOUNTER — Other Ambulatory Visit: Payer: Self-pay | Admitting: Internal Medicine

## 2018-08-22 NOTE — Telephone Encounter (Signed)
I had denied the request the pharmacy sent earlier because a rx was sent to be filled 08-17-18 #20.

## 2018-09-19 ENCOUNTER — Encounter: Payer: Self-pay | Admitting: Internal Medicine

## 2018-09-19 ENCOUNTER — Other Ambulatory Visit: Payer: Self-pay | Admitting: Internal Medicine

## 2018-09-19 DIAGNOSIS — M2550 Pain in unspecified joint: Secondary | ICD-10-CM

## 2018-09-19 DIAGNOSIS — Z9109 Other allergy status, other than to drugs and biological substances: Secondary | ICD-10-CM

## 2018-09-19 DIAGNOSIS — R6889 Other general symptoms and signs: Secondary | ICD-10-CM

## 2018-09-19 DIAGNOSIS — R21 Rash and other nonspecific skin eruption: Secondary | ICD-10-CM

## 2018-09-19 DIAGNOSIS — R5383 Other fatigue: Secondary | ICD-10-CM

## 2018-09-19 DIAGNOSIS — M2540 Effusion, unspecified joint: Secondary | ICD-10-CM

## 2018-09-19 MED ORDER — ALPRAZOLAM 0.25 MG PO TABS: 0.2500 mg | ORAL_TABLET | Freq: Two times a day (BID) | ORAL | 0 refills | Status: DC

## 2018-09-19 MED ORDER — PANTOPRAZOLE SODIUM 40 MG PO TBEC: 40.0000 mg | DELAYED_RELEASE_TABLET | Freq: Every day | ORAL | 0 refills | Status: DC

## 2018-09-19 NOTE — Telephone Encounter (Signed)
Last filled 08/17/2018... please advise

## 2018-09-26 ENCOUNTER — Telehealth: Payer: Federal, State, Local not specified - PPO | Admitting: Physician Assistant

## 2018-09-26 DIAGNOSIS — R05 Cough: Secondary | ICD-10-CM

## 2018-09-26 DIAGNOSIS — R509 Fever, unspecified: Secondary | ICD-10-CM

## 2018-09-26 DIAGNOSIS — R059 Cough, unspecified: Secondary | ICD-10-CM

## 2018-09-26 MED ORDER — PROMETHAZINE-DM 6.25-15 MG/5ML PO SYRP: 5.0000 mL | ORAL_SOLUTION | Freq: Four times a day (QID) | ORAL | 0 refills | Status: DC | PRN

## 2018-09-26 NOTE — Progress Notes (Signed)
I spoke with this patient on the phone.  She had no known sick contacts.  Her breathing was unlabored and she had no cough during our conversation.  She admits to feeling anxious and afraid during this pandemic.  I tried to reassure her and advised we send in some cough syrup.  She was appreciative of the screening process.  E-Visit for Corona Virus Screening Based on your current symptoms, it seems unlikely that your symptoms are related to the [place] virus.   Coronavirus disease 2019 (COVID-19)is a respiratory illness that can spread from person to person. The virus that causes COVID-19 is a new virus that was first identified in the country of [place] but is now found in multiple other countries and has spread to the [place].  Symptoms associated with the virus are mild to severe fever, cough, and shortness of breath. There is currently no vaccine to protect against COVID-19, and there is no specific antiviral treatment for the virus.  It is vitally important that if you feel that you have an infection such as this virus or any other virus that you stay home and away from places where you may spread it to others.  Currently, not all patients are not being tested. If the symptoms are mild and there is not a known exposure, performing the test is not indicated.  You can use medication such as A prescription cough medication called Phenergan DM 6.25 mg/15 mg. You make take one teaspoon / 5 ml every 4-6 hours as needed for cough  Reduce your risk of any infection by using the same precautions used for avoiding the common cold or flu: Wash your hands often with soap and warm water for at least 20 seconds.  If soap and water are not readily available, use an alcohol-based hand sanitizer with at least 60% alcohol.  If coughing or sneezing, cover your mouth and nose by coughing or sneezing into the elbow areas of your shirt or coat, into a tissue or into your sleeve (not your hands). Avoid shaking  hands with others and consider head nods or verbal greetings only.  Avoid touching your eyes,nose, or mouth with unwashed hands. Avoid close contact with people who are sick. Avoid places or events with large numbers of people in one location, like concerts or sporting events. Carefully consider travel plans you have or are making. If you are planning any travel outside or inside the [place], visit the CDC'sTravelers' Health webpagefor the latest health notices. If you have some symptoms but not all symptoms, continue to monitor at home and seek medical attention if your symptoms worsen. If you are having a medical emergency, call 911.  HOME CARE Only take medications as instructed by your medical team. Drink plenty of fluids and get plenty of rest. A steam or ultrasonic humidifier can help if you have congestion.   GET HELP RIGHT AWAY IF: You develop worsening fever. You become short of breath You cough up blood. Your symptoms become more severe MAKE SURE YOU  Understand these instructions. Will watch your condition. Will get help right away if you are not doing well or get worse.  Your e-visit answers were reviewed by a board certified advanced clinical practitioner to complete your personal care plan.  Depending on the condition, your plan could have included both over the counter or prescription medications.  If there is a problem please reply once you have received a response from your provider. Your safety is important to [place].  If you have drug allergies check your prescription carefully.    You can use MyChart to ask questions about today's visit, request a non-urgent call back, or ask for a work or school excuse for 24 hours related to this e-Visit. If it has been greater than 24 hours you will need to follow up with your provider, or enter a new e-Visit to address those concerns. You will get an e-mail in the next two days asking about your experience.  I hope that your e-visit has  been valuable and will speed your recovery. Thank you for using e-visits.    ===View-only below this line===   ----- Message -----    From: Illene Regulus    Sent: 09/26/2018 11:59 AM EDT      To: E-Visit Mailing List Subject: E-Visit Submission: CoronaVirus (QPYPP-50) Screening  E-Visit Submission: CoronaVirus (DTOIZ-12) Screening --------------------------------  Question: Do you have any of the following?  Answer:   Shortness of breath            Cough            Fever  Question: Do you have any of the following additional symptoms?  Answer:   Chest pain            Sore throat            Body aches            Headache  Question: Have you had a fever? Answer:   Yes  Question: If you are running a fever, please type in your temperature reading Answer:   101  Question: How long have you had the fever? Answer:   Just today  Question: Have others in your home or workplace had similar symptoms? Answer:   No  Question: When did your symptoms start? Answer:   09/25/2018  Question: Have you recently visited any of the following countries? Answer:   None of these  Question: If you have traveled anywhere in the last  2 months please document where you have visited: Answer:     Question: Have you recently been around others from these countries or visited these countries who have had coughing or fever? Answer:   No  Question: Have you recently been around anyone who has been diagnosed with Corona virus? Answer:   No  Question: Have you been taking any medications? Answer:   No  Question: If taking medications for these symptoms, please list the names and whether they are helping or not Answer:     Question: Are you treated for any of the following conditions: Asthma, COPD, Diabetes, Renal Failure (on Dialysis), AIDS, any Neuromuscular disease that effects the clearing of secretions, Heart Failure, or Heart Disease? Answer:   No  Question: Please enter a phone  number where you can be reached if we have additional questions about your symptoms Answer:   [phone]  Question: Please list your medication allergies that you may have ? (If 'none' , please list as 'none') Answer:   none  Question: Please list any additional comments  Answer:     Question: Are you pregnant? Answer:   I am confident that I am not pregnant  Question: Are you breastfeeding? Answer:   No  A total of 5-10 minutes was spent evaluating this patients questionnaire and formulating a plan of care.

## 2018-09-29 ENCOUNTER — Encounter: Payer: Self-pay | Admitting: Internal Medicine

## 2018-10-02 ENCOUNTER — Telehealth: Payer: Self-pay

## 2018-10-02 NOTE — Telephone Encounter (Signed)
Pt requested me to give her a call, Left detailed msg on VM per HIPAA

## 2018-10-03 ENCOUNTER — Telehealth: Payer: Self-pay

## 2018-10-03 NOTE — Telephone Encounter (Signed)
I called pt as she requested via mychart for me to call.  2 complaints  1- Pt did an evisit last week due to URI Sx...  Pt reports sore throat in the AM, productive cough yellow sputum, headache--pressure.Marland KitchenMarland KitchenNasal mucus is clear... x 2 weeks... denies CP/tightness, SOB or sick contacts. Pt reports difficulty finding meds in the store but she has tried OTC Tylenol and Benadryl last week, w/ minimal improvement. Last week from evisit pt was prescribed Rx cough syrup Prom/dex no relief just "knocks her out"  2- Anxiety  Pt reports in the past 2-3 weeks she has had to call EMS, once at home and once while she was out driving. A lot has changed in the past 3 months, she has started working from home, single mom, all 4 kids home due to [place]... pt feels overwhelmed. Panic attack last week, she experienced chest tightness... pt feels like she is needing more Xanax but is agreeable to changing or increasing Zoloft.  I have scheduled pt for 3:30 30 min appt

## 2018-10-04 ENCOUNTER — Telehealth (INDEPENDENT_AMBULATORY_CARE_PROVIDER_SITE_OTHER): Payer: Federal, State, Local not specified - PPO | Admitting: Internal Medicine

## 2018-10-04 ENCOUNTER — Other Ambulatory Visit: Payer: Self-pay

## 2018-10-04 DIAGNOSIS — F411 Generalized anxiety disorder: Secondary | ICD-10-CM | POA: Diagnosis not present

## 2018-10-04 DIAGNOSIS — F410 Panic disorder [episodic paroxysmal anxiety] without agoraphobia: Secondary | ICD-10-CM

## 2018-10-04 DIAGNOSIS — J00 Acute nasopharyngitis [common cold]: Secondary | ICD-10-CM

## 2018-10-04 MED ORDER — HYDROXYZINE HCL 10 MG PO TABS: 10.0000 mg | ORAL_TABLET | Freq: Two times a day (BID) | ORAL | 2 refills | Status: DC | PRN

## 2018-10-04 MED ORDER — SERTRALINE HCL 50 MG PO TABS: 50.0000 mg | ORAL_TABLET | Freq: Every day | ORAL | 2 refills | Status: DC

## 2018-10-04 MED ORDER — BENZONATATE 200 MG PO CAPS: 200.0000 mg | ORAL_CAPSULE | Freq: Three times a day (TID) | ORAL | 0 refills | Status: DC | PRN

## 2018-10-04 NOTE — Patient Instructions (Signed)

## 2018-10-04 NOTE — Progress Notes (Signed)
Virtual Visit via Telephone Note  I connected with Linda [name] on 10/04/18 at  3:30 PM EDT by telephone and verified that I am speaking with the correct person using two identifiers.   I discussed the limitations, risks, security and privacy concerns of performing an evaluation and management service by telephone and the availability of in person appointments. I also discussed with the patient that there may be a patient responsible charge related to this service. The patient expressed understanding and agreed to proceed.   History of Present Illness: Pt reports headache, runny nose, sore throat and cough. This started 2 weeks ago. The headache is located in the forehead. She describes the pain as pressure. She denies dizziness or visual changes. She is blowing yellow mucous out of her nose. She denies difficulty swallowing. The cough is productive of yellow mucous. She denies fever, chills or body aches. She has tried Tylenol and Benadryl with minimal relief. She did a Evist, diagnosed with viral cough. She was given Promethazine DM but reports all this does is knock her out. She has not had sick contacts.  She also reports increased anxiety and panic attacks in the last 2-3 weeks. She is under a lot of stress. She started working from home 3 months ago. She is a single mom of 4 kids who are all at home right now due to [place]. She is taking Sertraline and Xanax as prescribed. She denies depression, SI/HI.    Observations/Objective: Alert and oriented x 3. NAD. Does not sound congested. Cough is dry. No apparent SOB. Judgement and thought content seem normal.    Assessment and Plan:  Allergic Rhinitis:  Start Zyrtec and Flonase OTC RX for Tessalon Pearls 200 mg TID prn  Anxiety, Panic Attacks:  Support offered today Increase Sertraline to 50 mg daily Stop Xanax RX for Hydroxyzine 10 mg BID prn  Update me in 4 weeks via mychart   Follow Up Instructions:    I discussed the  assessment and treatment plan with the patient. The patient was provided an opportunity to ask questions and all were answered. The patient agreed with the plan and demonstrated an understanding of the instructions.   The patient was advised to call back or seek an in-person evaluation if the symptoms worsen or if the condition fails to improve as anticipated.  I provided 15 minutes of non-face-to-face time during this encounter.   Webb Silversmith, NP

## 2018-10-12 ENCOUNTER — Encounter: Payer: Self-pay | Admitting: Internal Medicine

## 2018-10-21 ENCOUNTER — Telehealth: Payer: Federal, State, Local not specified - PPO | Admitting: Nurse Practitioner

## 2018-10-21 ENCOUNTER — Emergency Department (HOSPITAL_COMMUNITY)
Admission: EM | Admit: 2018-10-21 | Discharge: 2018-10-21 | Disposition: A | Discharge status: Home / Self Care | Payer: Federal, State, Local not specified - PPO | Attending: Emergency Medicine | Admitting: Emergency Medicine

## 2018-10-21 ENCOUNTER — Emergency Department (HOSPITAL_COMMUNITY): Payer: Federal, State, Local not specified - PPO

## 2018-10-21 ENCOUNTER — Other Ambulatory Visit: Payer: Self-pay

## 2018-10-21 ENCOUNTER — Encounter (HOSPITAL_COMMUNITY): Payer: Self-pay

## 2018-10-21 DIAGNOSIS — R079 Chest pain, unspecified: Secondary | ICD-10-CM | POA: Diagnosis not present

## 2018-10-21 DIAGNOSIS — R059 Cough, unspecified: Secondary | ICD-10-CM

## 2018-10-21 DIAGNOSIS — Z79899 Other long term (current) drug therapy: Secondary | ICD-10-CM | POA: Insufficient documentation

## 2018-10-21 DIAGNOSIS — F419 Anxiety disorder, unspecified: Secondary | ICD-10-CM | POA: Diagnosis not present

## 2018-10-21 DIAGNOSIS — Z9884 Bariatric surgery status: Secondary | ICD-10-CM | POA: Insufficient documentation

## 2018-10-21 DIAGNOSIS — R0602 Shortness of breath: Secondary | ICD-10-CM | POA: Diagnosis not present

## 2018-10-21 DIAGNOSIS — J069 Acute upper respiratory infection, unspecified: Secondary | ICD-10-CM

## 2018-10-21 DIAGNOSIS — B9789 Other viral agents as the cause of diseases classified elsewhere: Secondary | ICD-10-CM | POA: Diagnosis not present

## 2018-10-21 DIAGNOSIS — R509 Fever, unspecified: Secondary | ICD-10-CM | POA: Diagnosis not present

## 2018-10-21 DIAGNOSIS — Z20822 Contact with and (suspected) exposure to covid-19: Secondary | ICD-10-CM

## 2018-10-21 DIAGNOSIS — R6889 Other general symptoms and signs: Secondary | ICD-10-CM

## 2018-10-21 DIAGNOSIS — R05 Cough: Secondary | ICD-10-CM | POA: Diagnosis not present

## 2018-10-21 DIAGNOSIS — J45909 Unspecified asthma, uncomplicated: Secondary | ICD-10-CM | POA: Insufficient documentation

## 2018-10-21 LAB — CBC WITH DIFFERENTIAL/PLATELET
Abs Immature Granulocytes: 0.03 10*3/uL (ref 0.00–0.07)
Basophils Absolute: 0 10*3/uL (ref 0.0–0.1)
Basophils Relative: 0 %
Eosinophils Absolute: 0 10*3/uL (ref 0.0–0.5)
Eosinophils Relative: 0 %
HCT: 40.3 % (ref 36.0–46.0)
Hemoglobin: 13.4 g/dL (ref 12.0–15.0)
Immature Granulocytes: 0 %
Lymphocytes Relative: 4 %
Lymphs Abs: 0.5 10*3/uL — ABNORMAL LOW (ref 0.7–4.0)
MCH: 32.7 pg (ref 26.0–34.0)
MCHC: 33.3 g/dL (ref 30.0–36.0)
MCV: 98.3 fL (ref 80.0–100.0)
Monocytes Absolute: 0.2 10*3/uL (ref 0.1–1.0)
Monocytes Relative: 2 %
Neutro Abs: 10.6 10*3/uL — ABNORMAL HIGH (ref 1.7–7.7)
Neutrophils Relative %: 94 %
Platelets: 182 10*3/uL (ref 150–400)
RBC: 4.1 MIL/uL (ref 3.87–5.11)
RDW: 12.8 % (ref 11.5–15.5)
WBC: 11.3 10*3/uL — ABNORMAL HIGH (ref 4.0–10.5)
nRBC: 0 % (ref 0.0–0.2)

## 2018-10-21 LAB — COMPREHENSIVE METABOLIC PANEL
ALT: 15 U/L (ref 0–44)
AST: 18 U/L (ref 15–41)
Albumin: 4.2 g/dL (ref 3.5–5.0)
Alkaline Phosphatase: 100 U/L (ref 38–126)
Anion gap: 9 (ref 5–15)
BUN: 9 mg/dL (ref 6–20)
CO2: 26 mmol/L (ref 22–32)
Calcium: 8.8 mg/dL — ABNORMAL LOW (ref 8.9–10.3)
Chloride: 102 mmol/L (ref 98–111)
Creatinine, Ser: 0.98 mg/dL (ref 0.44–1.00)
GFR calc Af Amer: >60 mL/min (ref >60)
GFR calc non Af Amer: >60 mL/min (ref >60)
Glucose, Bld: 103 mg/dL — ABNORMAL HIGH (ref 70–99)
Potassium: 3.4 mmol/L — ABNORMAL LOW (ref 3.5–5.1)
Sodium: 137 mmol/L (ref 135–145)
Total Bilirubin: 1.1 mg/dL (ref 0.3–1.2)
Total Protein: 7.6 g/dL (ref 6.5–8.1)

## 2018-10-21 MED ORDER — GUAIFENESIN 100 MG/5ML PO SYRP: 100.0000 mg | ORAL_SOLUTION | ORAL | 0 refills | Status: DC | PRN

## 2018-10-21 MED ORDER — BENZONATATE 100 MG PO CAPS: 100.0000 mg | ORAL_CAPSULE | Freq: Three times a day (TID) | ORAL | 0 refills | Status: DC | PRN

## 2018-10-21 MED ORDER — SODIUM CHLORIDE 0.9 % IV BOLUS
250.0000 mL | Freq: Once | INTRAVENOUS | Status: AC
  Administered 2018-10-21: 250 mL via INTRAVENOUS

## 2018-10-21 MED ORDER — AEROCHAMBER PLUS FLO-VU MEDIUM MISC
1.0000 | Freq: Once | Status: AC
  Administered 2018-10-21: 1
  Filled 2018-10-21: qty 1

## 2018-10-21 MED ORDER — KETOROLAC TROMETHAMINE 15 MG/ML IJ SOLN
15.0000 mg | Freq: Once | INTRAMUSCULAR | Status: AC
  Administered 2018-10-21: 15 mg via INTRAVENOUS
  Filled 2018-10-21: qty 1

## 2018-10-21 MED ORDER — ALBUTEROL SULFATE HFA 108 (90 BASE) MCG/ACT IN AERS
6.0000 | INHALATION_SPRAY | Freq: Once | RESPIRATORY_TRACT | Status: AC
  Administered 2018-10-21: 6 via RESPIRATORY_TRACT
  Filled 2018-10-21: qty 6.7

## 2018-10-21 NOTE — Progress Notes (Signed)
We are sorry you are not feeling well.  Here is how we plan to help!  Based on what you have shared with me, it looks like you may have a viral upper respiratory infection.  Upper respiratory infections are caused by a large number of viruses; however, rhinovirus is the most common cause.   Symptoms vary from person to person, with common symptoms including sore throat, cough, and fatigue or lack of energy.  A low-grade fever of up to 100.4 may present, but is often uncommon.  Symptoms vary however, and are closely related to a person's age or underlying illnesses.  The most common symptoms associated with an upper respiratory infection are nasal discharge or congestion, cough, sneezing, headache and pressure in the ears and face.  These symptoms usually persist for about 3 to 10 days, but can last up to 2 weeks.  It is important to know that upper respiratory infections do not cause serious illness or complications in most cases.    Upper respiratory infections can be transmitted from person to person, with the most common method of transmission being a person's hands.  The virus is able to live on the skin and can infect other persons for up to 2 hours after direct contact.  Also, these can be transmitted when someone coughs or sneezes; thus, it is important to cover the mouth to reduce this risk.  To keep the spread of the illness at [place], good hand hygiene is very important.  This is an infection that is most likely caused by a virus. There are no specific treatments other than to help you with the symptoms until the infection runs its course.  We are sorry you are not feeling well.  Here is how we plan to help!   For nasal congestion, you may use an oral decongestants such as Mucinex D or if you have glaucoma or high blood pressure use plain Mucinex.  Saline nasal spray or nasal drops can help and can safely be used as often as needed for congestion.  For your congestion, I have prescribed Fluticasone  nasal spray one spray in each nostril twice a day  If you do not have a history of heart disease, hypertension, diabetes or thyroid disease, prostate/bladder issues or glaucoma, you may also use Sudafed to treat nasal congestion.  It is highly recommended that you consult with a pharmacist or your primary care physician to ensure this medication is safe for you to take.     If you have a cough, you may use cough suppressants such as Delsym and Robitussin.  If you have glaucoma or high blood pressure, you can also use Coricidin HBP.   For cough use the tessalon perles that were rx previously.  If you have a sore or scratchy throat, use a saltwater gargle-  to  teaspoon of salt dissolved in a 4-ounce to 8-ounce glass of warm water.  Gargle the solution for approximately 15-30 seconds and then spit.  It is important not to swallow the solution.  You can also use throat lozenges/cough drops and Chloraseptic spray to help with throat pain or discomfort.  Warm or cold liquids can also be helpful in relieving throat pain.  For headache, pain or general discomfort, you can use Ibuprofen or Tylenol as directed.   Some authorities believe that zinc sprays or the use of Echinacea may shorten the course of your symptoms.   HOME CARE . Only take medications as instructed by your medical team. .  Be sure to drink plenty of fluids. Water is fine as well as fruit juices, sodas and electrolyte beverages. You may want to stay away from caffeine or alcohol. If you are nauseated, try taking small sips of liquids. How do you know if you are getting enough fluid? Your urine should be a pale yellow or almost colorless. . Get rest. . Taking a steamy shower or using a humidifier may help nasal congestion and ease sore throat pain. You can place a towel over your head and breathe in the steam from hot water coming from a faucet. . Using a saline nasal spray works much the same way. . Cough drops, hard candies and sore  throat lozenges may ease your cough. . Avoid close contacts especially the very young and the elderly . Cover your mouth if you cough or sneeze . Always remember to wash your hands.   GET HELP RIGHT AWAY IF: . You develop worsening fever. . If your symptoms do not improve within 10 days . You develop yellow or green discharge from your nose over 3 days. . You have coughing fits . You develop a severe head ache or visual changes. . You develop shortness of breath, difficulty breathing or start having chest pain . Your symptoms persist after you have completed your treatment plan  MAKE SURE YOU   Understand these instructions.  Will watch your condition.  Will get help right away if you are not doing well or get worse.  Your e-visit answers were reviewed by a board certified advanced clinical practitioner to complete your personal care plan. Depending upon the condition, your plan could have included both over the counter or prescription medications. Please review your pharmacy choice. If there is a problem, you may call our nursing hot line at and have the prescription routed to another pharmacy. Your safety is important to [place]. If you have drug allergies check your prescription carefully.   You can use MyChart to ask questions about today's visit, request a non-urgent call back, or ask for a work or school excuse for 24 hours related to this e-Visit. If it has been greater than 24 hours you will need to follow up with your provider, or enter a new e-Visit to address those concerns. You will get an e-mail in the next two days asking about your experience.  I hope that your e-visit has been valuable and will speed your recovery. Thank you for using e-visits.   5 minutes spent reviewing and documenting in chart.

## 2018-10-21 NOTE — ED Triage Notes (Signed)
EMS reports from home, SOB, cough, fever and body aches x 2 days. Pt reports travel to [place] 2 weeks ago. Pt states had teleconsult for cough 3 weeks ago and was given cough meds.  BP 108/76 HR 120 RR 20 Sp02 99 RA Temp 100.0  Took Tylenol 1000mg  at 1200

## 2018-10-21 NOTE — ED Provider Notes (Signed)
[facility] DEPT Provider Note   CSN: [id number] Arrival date & time: 10/21/18  1601    History   Chief Complaint Chief Complaint  Patient presents with   Possible Covid, Fever, Cough, SOB    HPI Linda [name] is a 43 y.o. female presenting for evaluation of fever, body aches, cough.  Pt states she has had a persistent cough for the past several weeks.  Yesterday she developed body aches, fever, and sore throat.  Patient states her T-max was 102.4 this morning.  She is taking a total of 2 g of Tylenol since 7:00 this morning.  Patient states she also has chest tightness, mostly when laying flat.  She has an inhaler for when she used to have asthma, when she uses the chest tightness improves.  She has been taking Tessalon Perles without improvement of her cough.  She denies ear pain, chest pain, nausea, vomiting, abdominal pain, urinary symptoms.  Patient reports associated diarrhea over the past several days.  She states she traveled from [place] 2 weeks ago, but no other recent travel.  She denies exposure to known COVID-19 positive patient.     HPI  Past Medical History:  Diagnosis Date   Anxiety    Asthma    GERD (gastroesophageal reflux disease)    Heart murmur    while pregnant   Migraine     Patient Active Problem List   Diagnosis Date Noted   GERD (gastroesophageal reflux disease) 01/03/2017   Seasonal allergies 01/03/2017   Migraine without aura and without status migrainosus, not intractable 08/04/2015   Anxiety 04/15/2014    Past Surgical History:  Procedure Laterality Date   ABDOMINAL HYSTERECTOMY  12/20012   Total   ENDOMETRIAL ABLATION  2012   GASTRIC ROUX-EN-Y N/A 05/18/2016   Procedure: LAPAROSCOPIC ROUX-EN-Y GASTRIC WITH UPPER ENDO;  Surgeon: Arta Bruce Kinsinger, MD;  Location: [facility];  Service: General;  Laterality: N/A;   MYRINGOTOMY WITH TUBE PLACEMENT     X 3 SINCE ORRIGINALLY PLACE IN 2010    TONSILECTOMY, ADENOIDECTOMY, BILATERAL MYRINGOTOMY AND TUBES  2010   TONSILLECTOMY     TUBAL LIGATION  08/2010   UPPER GI ENDOSCOPY  05/18/2016   Procedure: UPPER GI ENDOSCOPY;  Surgeon: Arta Bruce Kinsinger, MD;  Location: [facility];  Service: General;;     OB History   No obstetric history on file.      Home Medications    Prior to Admission medications   Medication Sig Start Date End Date Taking? Authorizing Provider  acetaminophen (TYLENOL) 500 MG tablet Take 500 mg by mouth every 6 (six) hours as needed for fever.   Yes [provider]  benzonatate (TESSALON PERLES) 100 MG capsule Take 1 capsule (100 mg total) by mouth 3 (three) times daily as needed. Patient taking differently: Take 100 mg by mouth 3 (three) times daily as needed for cough.  10/21/18  Yes Martin, Mary-Margaret, FNP  promethazine-dextromethorphan (PROMETHAZINE-DM) 6.25-15 MG/5ML syrup Take 5 mLs by mouth 4 (four) times daily as needed for cough. 09/26/18  Yes Tereasa Coop, PA-C  sertraline (ZOLOFT) 50 MG tablet Take 1 tablet (50 mg total) by mouth daily. 10/04/18  Yes Jearld Fenton, NP  acyclovir ointment (ZOVIRAX) 5 % Apply 1 application topically every 3 (three) hours. Patient not taking: Reported on 10/21/2018 04/10/18   Jearld Fenton, NP  guaifenesin (ROBITUSSIN) 100 MG/5ML syrup Take 5-10 mLs (100-200 mg total) by mouth every 4 (four) hours as needed  for cough. 10/21/18   Ruthe Roemer, PA-C  hydrOXYzine (ATARAX/VISTARIL) 10 MG tablet Take 1 tablet (10 mg total) by mouth 2 (two) times daily as needed. Patient not taking: Reported on 10/21/2018 10/04/18   Jearld Fenton, NP  pantoprazole (PROTONIX) 40 MG tablet Take 1 tablet (40 mg total) by mouth daily. Patient not taking: Reported on 10/21/2018 09/19/18   Jearld Fenton, NP  Vitamin D, Ergocalciferol, (DRISDOL) 1.25 MG (50000 UT) CAPS capsule Take 1 capsule (50,000 Units total) by mouth every 7 (seven) days. Patient not taking: Reported on  10/21/2018 05/18/18   Jearld Fenton, NP  zolmitriptan (ZOMIG) 5 MG tablet TAKE ONE TABLET BY MOUTH AT ON SET OF HEADACHE. CAN TAKE ONE MORE TABLET 2 HOURS LATER IF NEEDED. Patient taking differently: Take 5 mg by mouth daily as needed for migraine. TAKE ONE TABLET BY MOUTH AT ON SET OF HEADACHE. CAN TAKE ONE MORE TABLET 2 HOURS LATER IF NEEDED. 09/15/17   Pieter Partridge, DO  zolmitriptan (ZOMIG) 5 MG tablet Take 1 tablet (5 mg total) by mouth as needed for migraine. Patient not taking: Reported on 10/21/2018 06/02/18   Pieter Partridge, DO  zonisamide (ZONEGRAN) 100 MG capsule Take 2 capsules (200 mg total) by mouth daily. Patient not taking: Reported on 10/21/2018 07/06/17   Pieter Partridge, DO    Family History Family History  Problem Relation Age of Onset   Hypertension Father    Diabetes Father    Hypertension Mother    Cancer Mother        uterine   Stroke Mother    Diabetes Maternal Aunt    Diabetes Maternal Uncle    Cancer Maternal Grandmother        PANCREATIC   Hypertension Maternal Grandmother    Diabetes Maternal Grandmother    Cancer Maternal Grandfather        LUNG   Hypertension Maternal Grandfather    Diabetes Maternal Grandfather    Cancer Paternal Grandmother        THROAT   Alzheimer's disease Paternal Grandmother     Social History Social History   Tobacco Use   Smoking status: Never Smoker   Smokeless tobacco: Never Used  Substance Use Topics   Alcohol use: Yes    Alcohol/week: 1.0 standard drinks    Types: 1 Standard drinks or equivalent per week    Comment: occasional (not much)   Drug use: No     Allergies   Bacitra-neomycin-polymyxin-hc   Review of Systems Review of Systems  Constitutional: Positive for fever.  HENT: Positive for sore throat.   Respiratory: Positive for cough and chest tightness.   Gastrointestinal: Positive for diarrhea.  All other systems reviewed and are negative.    Physical Exam Updated Vital  Signs BP (!) 153/88    Pulse (!) 130    Temp 99 F (37.2 C) (Oral)    Resp 20    LMP 07/12/2011    SpO2 99%   Physical Exam Vitals signs and nursing note reviewed.  Constitutional:      General: She is not in acute distress.    Appearance: She is well-developed.     Comments: Appears nontoxic  HENT:     Head: Normocephalic and atraumatic.  Eyes:     Extraocular Movements: Extraocular movements intact.     Conjunctiva/sclera: Conjunctivae normal.  Neck:     Musculoskeletal: Normal range of motion and neck supple.  Cardiovascular:     Rate and  Rhythm: Regular rhythm. Tachycardia present.     Comments: Tachycardic around 110 Pulmonary:     Effort: Pulmonary effort is normal. No respiratory distress.     Comments: Speaking in full sentences.  No respiratory distress or accessory muscle use. SpO2 stable on RA btwn 98 and 100% Abdominal:     General: There is no distension.  Musculoskeletal: Normal range of motion.  Skin:    General: Skin is warm and dry.     Capillary Refill: Capillary refill takes less than 2 seconds.  Neurological:     Mental Status: She is alert and oriented to person, place, and time.      ED Treatments / Results  Labs (all labs ordered are listed, but only abnormal results are displayed) Labs Reviewed  CBC WITH DIFFERENTIAL/PLATELET - Abnormal; Notable for the following components:      Result Value   WBC 11.3 (*)    Neutro Abs 10.6 (*)    Lymphs Abs 0.5 (*)    All other components within normal limits  COMPREHENSIVE METABOLIC PANEL - Abnormal; Notable for the following components:   Potassium 3.4 (*)    Glucose, Bld 103 (*)    Calcium 8.8 (*)    All other components within normal limits  URINALYSIS, ROUTINE W REFLEX MICROSCOPIC    EKG None  Radiology Dg Chest Portable 1 View  Result Date: 10/21/2018 CLINICAL DATA:  Shortness of breath, cough, fever. EXAM: PORTABLE CHEST 1 VIEW COMPARISON:  Radiographs of June 05, 2018. FINDINGS: The  heart size and mediastinal contours are within normal limits. Both lungs are clear. No pneumothorax or pleural effusion is noted. The visualized skeletal structures are unremarkable. IMPRESSION: No active disease. Electronically Signed   By: Marijo Conception, M.D.   On: 10/21/2018 17:40    Procedures Procedures (including critical care time)  Medications Ordered in ED Medications  sodium chloride 0.9 % bolus 250 mL (250 mLs Intravenous New Bag/Given 10/21/18 1700)  ketorolac (TORADOL) 15 MG/ML injection 15 mg (15 mg Intravenous Given 10/21/18 1700)  albuterol (PROVENTIL HFA;VENTOLIN HFA) 108 (90 Base) MCG/ACT inhaler 6 puff (6 puffs Inhalation Given 10/21/18 1700)  AeroChamber Plus Flo-Vu Medium MISC 1 each (1 each Other Given 10/21/18 1701)     Initial Impression / Assessment and Plan / ED Course  I have reviewed the triage vital signs and the nursing notes.  Pertinent labs & imaging results that were available during my care of the patient were reviewed by me and considered in my medical decision making (see chart for details).        Patient presenting for evaluation of cough, fever, sore throat.  Physical exam patient is mildly tachycardic, but in no acute distress.  Respiratory status is reassuring.  While cough for several weeks is less consistent with COVID-19, patient with new fever, body aches, and worsening symptoms yesterday.  As such, consider possibility of infection on top of a previous cough.  Also consider pneumonia versus other viral URI.  Will order chest x-ray, basic labs, and reassess.  Chest x-ray viewed interpreted by me, no pneumonia, pneumothorax, effusion, cardiomegaly.  Labs with mild leukocytosis, once again reassuring that this may not be coronavirus.  With albuterol, patient reports chest tightness completely resolved, as such, low suspicion for ACS etiology.  Urine pending, patient states she does not want to wait for the urine to result.  As she is not having any  urinary symptoms, I am okay with discharge prior to UA.  Discussed  with patient that her symptoms may be due to a coronavirus, and discussed importance of quarantine.  Encourage patient to continue symptomatic treatment, and close monitoring of symptoms.  Patient encouraged to use ED visits if she has questions for her PCP, and return to the ER if she has increased shortness of breath and feels she needs to be admitted.  At this time, patient appears safe for discharge.  Return precautions given.  Patient states she understands agrees to plan.   Linda [name] was evaluated in Emergency Department on 10/21/2018 for the symptoms described in the history of present illness. She was evaluated in the context of the global COVID-19 pandemic, which necessitated consideration that the patient might be at risk for infection with the SARS-CoV-2 virus that causes COVID-19. Institutional protocols and algorithms that pertain to the evaluation of patients at risk for COVID-19 are in a state of rapid change based on information released by regulatory bodies including the CDC and federal and state organizations. These policies and algorithms were followed during the patient's care in the ED.   Final Clinical Impressions(s) / ED Diagnoses   Final diagnoses:  Fever, unspecified fever cause  Cough  Suspected Covid-19 Virus Infection    ED Discharge Orders         Ordered    guaifenesin (ROBITUSSIN) 100 MG/5ML syrup  Every 4 hours PRN     10/21/18 1838           Franchot Heidelberg, PA-C 10/21/18 1843    Lacretia Leigh, MD 10/22/18 1312

## 2018-10-21 NOTE — Discharge Instructions (Addendum)
You likely have a viral illness, which may be Coronavirus.  This should be treated symptomatically. It is important that you quarantine until the following conditions have been met: At least a week from when symptoms began, you have been fever free for 72 hours without Tylenol, and your symptoms are improving. Use Tylenol or ibuprofen as needed for fevers or body aches. Use Flonase daily for nasal congestion and cough. Cough drops and syrup as needed. Make sure you stay well-hydrated with water. Wash your hands frequently to prevent spread of infection. Call your doctor as needed for further evaluation. Return to the emergency room if you develop difficulty breathing, or any new or worsening symptoms.   If you live with, or provide care at home for, a person confirmed to have, or being evaluated for, COVID-19 infection please follow these guidelines to prevent infection:  Follow healthcare providers instructions Make sure that you understand and can help the patient follow any healthcare provider instructions for all care.  Provide for the patients basic needs You should help the patient with basic needs in the home and provide support for getting groceries, prescriptions, and other personal needs.  Monitor the patients symptoms If they are getting sicker, call his or her medical provider a  This will help the healthcare providers office take steps to keep other people from getting infected. Ask the healthcare provider to call the local or state health department.  Limit the number of people who have contact with the patient If possible, have only one caregiver for the patient. Other household members should stay in another home or place of residence. If this is not possible, they should stay in another room, or be separated from the patient as much as possible. Use a separate bathroom, if available. Restrict visitors who do not have an essential need to be in the home.  Keep  older adults, very young children, and other sick people away from the patient Keep older adults, very young children, and those who have compromised immune systems or chronic health conditions away from the patient. This includes people with chronic heart, lung, or kidney conditions, diabetes, and cancer.  Ensure good ventilation Make sure that shared spaces in the home have good air flow, such as from an air conditioner or an opened window, weather permitting.  Wash your hands often Wash your hands often and thoroughly with soap and water for at least 20 seconds. You can use an alcohol based hand sanitizer if soap and water are not available and if your hands are not visibly dirty. Avoid touching your eyes, nose, and mouth with unwashed hands. Use disposable paper towels to dry your hands. If not available, use dedicated cloth towels and replace them when they become wet.  Wear a facemask and gloves Wear a disposable facemask at all times in the room and gloves when you touch or have contact with the patients blood, body fluids, and/or secretions or excretions, such as sweat, saliva, sputum, nasal mucus, vomit, urine, or feces.  Ensure the mask fits over your nose and mouth tightly, and do not touch it during use. Throw out disposable facemasks and gloves after using them. Do not reuse. Wash your hands immediately after removing your facemask and gloves. If your personal clothing becomes contaminated, carefully remove clothing and launder. Wash your hands after handling contaminated clothing. Place all used disposable facemasks, gloves, and other waste in a lined container before disposing them with other household waste. Remove gloves and wash your  hands immediately after handling these items.  Do not share dishes, glasses, or other household items with the patient Avoid sharing household items. You should not share dishes, drinking glasses, cups, eating utensils, towels, bedding, or other  items After the person uses these items, you should wash them thoroughly with soap and water.  Wash laundry thoroughly Immediately remove and wash clothes or bedding that have blood, body fluids, and/or secretions or excretions, such as sweat, saliva, sputum, nasal mucus, vomit, urine, or feces, on them. Wear gloves when handling laundry from the patient. Read and follow directions on labels of laundry or clothing items and detergent. In general, wash and dry with the warmest temperatures recommended on the label.  Clean all areas the individual has used often Clean all touchable surfaces, such as counters, tabletops, doorknobs, bathroom fixtures, toilets, phones, keyboards, tablets, and bedside tables, every day. Also, clean any surfaces that may have blood, body fluids, and/or secretions or excretions on them. Wear gloves when cleaning surfaces the patient has come in contact with. Use a diluted bleach solution (e.g., dilute bleach with 1 part bleach and 10 parts water) or a household disinfectant with a label that says EPA-registered for coronaviruses. To make a bleach solution at home, add 1 tablespoon of bleach to 1 quart (4 cups) of water. For a larger supply, add  cup of bleach to 1 gallon (16 cups) of water. Read labels of cleaning products and follow recommendations provided on product labels. Labels contain instructions for safe and effective use of the cleaning product including precautions you should take when applying the product, such as wearing gloves or eye protection and making sure you have good ventilation during use of the product. Remove gloves and wash hands immediately after cleaning.  Monitor yourself for signs and symptoms of illness Caregivers and household members are considered close contacts, should monitor their health, and will be asked to limit movement outside of the home to the extent possible. Follow the monitoring steps for close contacts listed on the symptom  monitoring form.   ? If you have additional questions, contact your local health department or call the epidemiologist on call at [phone] (available 24/7). ? This guidance is subject to change. For the most up-to-date guidance from [facility], please refer to their website: [email or web address].pl

## 2018-10-21 NOTE — ED Notes (Signed)
Bed: WA12 Expected date:  Expected time:  Means of arrival:  Comments: Negative pressure 

## 2018-10-21 NOTE — Progress Notes (Signed)
You could have covid. If Shortness of breathe worsens then you can go to the ER at [facility] which is the covid center now. Here is some information about covid below. E-Visit for Corona Virus Screening  Based on your current symptoms, you may very well have the virus, however your symptoms are mild. Currently, not all patients are being tested. If the symptoms are mild and there is not a known exposure, performing the test is not indicated.  Coronavirus disease 2019 (COVID-19)is a respiratory illness that can spread from person to person. The virus that causes COVID-19 is a new virus that was first identified in the country of [place] but is now found in multiple other countries and has spread to the [place].  Symptoms associated with the virus are mild to severe fever, cough, and shortness of breath. There is currently no vaccine to protect against COVID-19, and there is no specific antiviral treatment for the virus.   To be considered HIGH RISK for Coronavirus (COVID-19), you have to meet the following criteria:  . Traveled to [place], [place], [place], [place] or [place]; or in the [place] to [place], [place], [place], or [place]; and have fever, cough, and shortness of breath within the last 2 weeks of travel OR  . Been in close contact with a person diagnosed with COVID-19 within the last 2 weeks and have fever, cough, and shortness of breath  . IF YOU DO NOT MEET THESE CRITERIA, YOU ARE CONSIDERED LOW RISK FOR COVID-19.   It is vitally important that if you feel that you have an infection such as this virus or any other virus that you stay home and away from places where you may spread it to others.  You should self-quarantine for 14 days if you have symptoms that could potentially be coronavirus and avoid contact with people age 41 and older.   You can use medication such as delsym or mucinex OTC for cough  You may also take acetaminophen (Tylenol) as needed for  fever.   Reduce your risk of any infection by using the same precautions used for avoiding the common cold or flu: Wash your hands often with soap and warm water for at least 20 seconds.  If soap and water are not readily available, use an alcohol-based hand sanitizer with at least 60% alcohol.  If coughing or sneezing, cover your mouth and nose by coughing or sneezing into the elbow areas of your shirt or coat, into a tissue or into your sleeve (not your hands). Avoid shaking hands with others and consider head nods or verbal greetings only.  Avoid touching your eyes,nose, or mouth with unwashed hands. Avoid close contact with people who are sick. Avoid places or events with large numbers of people in one location, like concerts or sporting events. Carefully consider travel plans you have or are making. If you are planning any travel outside or inside the [place], visit the CDC'sTravelers' Health webpagefor the latest health notices. If you have some symptoms but not all symptoms, continue to monitor at home and seek medical attention if your symptoms worsen. If you are having a medical emergency, call 911.  HOME CARE Only take medications as instructed by your medical team. Drink plenty of fluids and get plenty of rest. A steam or ultrasonic humidifier can help if you have congestion.   GET HELP RIGHT AWAY IF: You develop worsening fever. You become short of breath You cough up blood. Your symptoms  become more severe MAKE SURE YOU  Understand these instructions. Will watch your condition. Will get help right away if you are not doing well or get worse.  Your e-visit answers were reviewed by a board certified advanced clinical practitioner to complete your personal care plan.  Depending on the condition, your plan could have included both over the counter or prescription medications.  If there is a problem please reply once you have received a response from your provider. Your safety  is important to [place].  If you have drug allergies check your prescription carefully.    You can use MyChart to ask questions about today's visit, request a non-urgent call back, or ask for a work or school excuse for 24 hours related to this e-Visit. If it has been greater than 24 hours you will need to follow up with your provider, or enter a new e-Visit to address those concerns. You will get an e-mail in the next two days asking about your experience.  I hope that your e-visit has been valuable and will speed your recovery. Thank you for using e-visits.

## 2018-10-23 ENCOUNTER — Encounter

## 2018-10-23 ENCOUNTER — Ambulatory Visit: Payer: Federal, State, Local not specified - PPO | Admitting: Neurology

## 2018-11-02 ENCOUNTER — Encounter: Payer: Self-pay | Admitting: Internal Medicine

## 2018-11-03 NOTE — Telephone Encounter (Signed)
I would prefer her to try to Buspar first.

## 2018-11-03 NOTE — Telephone Encounter (Signed)
Jearld Fenton, NP  to Illene Regulus       2:14 PM  My recommendation would be to stop the Hydroxyzine if it is making you worse. At this point, I think we should increase the Sertraline to 100 mg (2 50 mg tabs daily) and see if that helps with your anxiety. If you don't want to do that, we could add Buspar 5 mg 2 x day to help reduce anxiety. Let me know what you think.

## 2018-11-06 ENCOUNTER — Other Ambulatory Visit: Payer: Self-pay | Admitting: Physician Assistant

## 2018-11-06 MED ORDER — BUSPIRONE HCL 5 MG PO TABS: 5.0000 mg | ORAL_TABLET | Freq: Two times a day (BID) | ORAL | 2 refills | Status: DC

## 2018-11-16 ENCOUNTER — Other Ambulatory Visit: Payer: Self-pay | Admitting: Neurology

## 2018-12-08 ENCOUNTER — Observation Stay
Admission: EM | Admit: 2018-12-08 | Discharge: 2018-12-08 | Disposition: A | Discharge status: Home / Self Care | Payer: Federal, State, Local not specified - PPO | Attending: Internal Medicine | Admitting: Internal Medicine

## 2018-12-08 ENCOUNTER — Observation Stay (HOSPITAL_BASED_OUTPATIENT_CLINIC_OR_DEPARTMENT_OTHER)
Admit: 2018-12-08 | Discharge: 2018-12-08 | Disposition: A | Discharge status: Home / Self Care | Payer: Federal, State, Local not specified - PPO | Attending: Internal Medicine | Admitting: Internal Medicine

## 2018-12-08 ENCOUNTER — Observation Stay: Payer: Federal, State, Local not specified - PPO

## 2018-12-08 ENCOUNTER — Other Ambulatory Visit: Payer: Self-pay

## 2018-12-08 ENCOUNTER — Emergency Department: Payer: Federal, State, Local not specified - PPO

## 2018-12-08 ENCOUNTER — Encounter: Payer: Self-pay | Admitting: Emergency Medicine

## 2018-12-08 DIAGNOSIS — G43809 Other migraine, not intractable, without status migrainosus: Secondary | ICD-10-CM | POA: Diagnosis not present

## 2018-12-08 DIAGNOSIS — Z1159 Encounter for screening for other viral diseases: Secondary | ICD-10-CM | POA: Diagnosis not present

## 2018-12-08 DIAGNOSIS — I10 Essential (primary) hypertension: Secondary | ICD-10-CM | POA: Insufficient documentation

## 2018-12-08 DIAGNOSIS — R29898 Other symptoms and signs involving the musculoskeletal system: Secondary | ICD-10-CM | POA: Insufficient documentation

## 2018-12-08 DIAGNOSIS — E876 Hypokalemia: Secondary | ICD-10-CM | POA: Insufficient documentation

## 2018-12-08 DIAGNOSIS — R531 Weakness: Secondary | ICD-10-CM

## 2018-12-08 DIAGNOSIS — G459 Transient cerebral ischemic attack, unspecified: Secondary | ICD-10-CM

## 2018-12-08 DIAGNOSIS — F419 Anxiety disorder, unspecified: Secondary | ICD-10-CM | POA: Insufficient documentation

## 2018-12-08 DIAGNOSIS — R200 Anesthesia of skin: Secondary | ICD-10-CM | POA: Diagnosis not present

## 2018-12-08 DIAGNOSIS — R1310 Dysphagia, unspecified: Secondary | ICD-10-CM | POA: Diagnosis not present

## 2018-12-08 DIAGNOSIS — K219 Gastro-esophageal reflux disease without esophagitis: Secondary | ICD-10-CM | POA: Insufficient documentation

## 2018-12-08 DIAGNOSIS — Z79899 Other long term (current) drug therapy: Secondary | ICD-10-CM | POA: Insufficient documentation

## 2018-12-08 DIAGNOSIS — I361 Nonrheumatic tricuspid (valve) insufficiency: Secondary | ICD-10-CM

## 2018-12-08 DIAGNOSIS — Z20828 Contact with and (suspected) exposure to other viral communicable diseases: Secondary | ICD-10-CM | POA: Diagnosis not present

## 2018-12-08 DIAGNOSIS — G43109 Migraine with aura, not intractable, without status migrainosus: Secondary | ICD-10-CM | POA: Diagnosis not present

## 2018-12-08 DIAGNOSIS — I6523 Occlusion and stenosis of bilateral carotid arteries: Secondary | ICD-10-CM | POA: Diagnosis not present

## 2018-12-08 DIAGNOSIS — R51 Headache: Secondary | ICD-10-CM | POA: Diagnosis not present

## 2018-12-08 DIAGNOSIS — J45909 Unspecified asthma, uncomplicated: Secondary | ICD-10-CM | POA: Diagnosis not present

## 2018-12-08 LAB — SARS CORONAVIRUS 2 BY RT PCR (HOSPITAL ORDER, PERFORMED IN ~~LOC~~ HOSPITAL LAB): SARS Coronavirus 2: NEGATIVE

## 2018-12-08 LAB — TROPONIN I
Troponin I: <0.03 ng/mL (ref <0.03)
Troponin I: <0.03 ng/mL (ref <0.03)

## 2018-12-08 LAB — ECHOCARDIOGRAM COMPLETE
Height: 68 in
Weight: 2786.61 oz

## 2018-12-08 LAB — BASIC METABOLIC PANEL
Anion gap: 11 (ref 5–15)
BUN: 11 mg/dL (ref 6–20)
CO2: 22 mmol/L (ref 22–32)
Calcium: 8.5 mg/dL — ABNORMAL LOW (ref 8.9–10.3)
Chloride: 105 mmol/L (ref 98–111)
Creatinine, Ser: 0.58 mg/dL (ref 0.44–1.00)
GFR calc Af Amer: >60 mL/min (ref >60)
GFR calc non Af Amer: >60 mL/min (ref >60)
Glucose, Bld: 94 mg/dL (ref 70–99)
Potassium: 3.4 mmol/L — ABNORMAL LOW (ref 3.5–5.1)
Sodium: 138 mmol/L (ref 135–145)

## 2018-12-08 LAB — CBC
HCT: 36.7 % (ref 36.0–46.0)
Hemoglobin: 12.3 g/dL (ref 12.0–15.0)
MCH: 31.6 pg (ref 26.0–34.0)
MCHC: 33.5 g/dL (ref 30.0–36.0)
MCV: 94.3 fL (ref 80.0–100.0)
Platelets: 249 10*3/uL (ref 150–400)
RBC: 3.89 MIL/uL (ref 3.87–5.11)
RDW: 13.2 % (ref 11.5–15.5)
WBC: 7.7 10*3/uL (ref 4.0–10.5)
nRBC: 0 % (ref 0.0–0.2)

## 2018-12-08 LAB — TSH: TSH: 2.2 u[IU]/mL (ref 0.350–4.500)

## 2018-12-08 LAB — MRSA PCR SCREENING: MRSA by PCR: NEGATIVE

## 2018-12-08 MED ORDER — ONDANSETRON HCL 4 MG PO TABS: 4.0000 mg | ORAL_TABLET | Freq: Four times a day (QID) | ORAL | Status: DC | PRN

## 2018-12-08 MED ORDER — LORAZEPAM 2 MG/ML IJ SOLN
1.0000 mg | INTRAMUSCULAR | Status: AC
  Administered 2018-12-08: 06:00:00 1 mg via INTRAVENOUS
  Filled 2018-12-08: qty 1

## 2018-12-08 MED ORDER — STROKE: EARLY STAGES OF RECOVERY BOOK
Freq: Once | Status: AC
  Administered 2018-12-08: 07:00:00

## 2018-12-08 MED ORDER — B COMPLEX PO TABS: 1.0000 | ORAL_TABLET | Freq: Every day | ORAL | 1 refills | Status: DC

## 2018-12-08 MED ORDER — MORPHINE SULFATE (PF) 2 MG/ML IV SOLN
1.0000 mg | INTRAVENOUS | Status: AC
  Administered 2018-12-08: 1 mg via INTRAVENOUS
  Filled 2018-12-08: qty 1

## 2018-12-08 MED ORDER — MAGNESIUM OXIDE 400 (241.3 MG) MG PO TABS: 400.0000 mg | ORAL_TABLET | Freq: Every day | ORAL | Status: DC

## 2018-12-08 MED ORDER — POTASSIUM CHLORIDE CRYS ER 20 MEQ PO TBCR
40.0000 meq | EXTENDED_RELEASE_TABLET | Freq: Once | ORAL | Status: AC
  Administered 2018-12-08: 40 meq via ORAL
  Filled 2018-12-08: qty 2

## 2018-12-08 MED ORDER — ENOXAPARIN SODIUM 40 MG/0.4ML ~~LOC~~ SOLN: 40.0000 mg | SUBCUTANEOUS | Status: DC

## 2018-12-08 MED ORDER — ASPIRIN 300 MG RE SUPP
300.0000 mg | Freq: Once | RECTAL | Status: AC
  Administered 2018-12-08: 05:00:00 300 mg via RECTAL
  Filled 2018-12-08: qty 1

## 2018-12-08 MED ORDER — MORPHINE SULFATE (PF) 2 MG/ML IV SOLN
1.0000 mg | Freq: Once | INTRAVENOUS | Status: AC
  Administered 2018-12-08: 10:00:00 1 mg via INTRAVENOUS
  Filled 2018-12-08: qty 1

## 2018-12-08 MED ORDER — MAGNESIUM OXIDE 400 (241.3 MG) MG PO TABS: 400.0000 mg | ORAL_TABLET | Freq: Every day | ORAL | 1 refills | Status: DC

## 2018-12-08 MED ORDER — ASPIRIN 81 MG PO CHEW
324.0000 mg | CHEWABLE_TABLET | Freq: Once | ORAL | Status: DC
  Filled 2018-12-08: qty 4

## 2018-12-08 MED ORDER — ONDANSETRON HCL 4 MG/2ML IJ SOLN: 4.0000 mg | Freq: Four times a day (QID) | INTRAMUSCULAR | Status: DC | PRN

## 2018-12-08 NOTE — Discharge Summary (Signed)
Linda [name] at [place] NAME: Linda [name]    MR#:  [id number]  DATE OF BIRTH:  02-27-1976  DATE OF ADMISSION:  12/08/2018 ADMITTING PHYSICIAN: Fritzi Mandes, MD  DATE OF DISCHARGE: 12/08/2018  PRIMARY CARE PHYSICIAN: Jearld Fenton, NP    ADMISSION DIAGNOSIS:  Weakness [R53.1] Other migraine without status migrainosus, not intractable [G43.809]  DISCHARGE DIAGNOSIS:  Complicated Migraine  SECONDARY DIAGNOSIS:   Past Medical History:  Diagnosis Date  . Anxiety   . Asthma   . GERD (gastroesophageal reflux disease)   . Heart murmur    while pregnant  . Migraine     HOSPITAL COURSE:   43 year old female admitted for complicated migraine. 1.  Migraine: Complicated by sensory change and mild inconsistent weakness of left hand. -  CT brain shows no hemorrhage or ischemia.  The patient has a long history of complicated migraines.  - MRI brain negative for CVA -ultrasound carotid no hemodynamically significant stenosis -  Consult neurology with Dr. Irish Elders.  she may resume her Zonegran. And Zomig as before -magnesium 500 mg daily and vitamin B complex added to home medication regimen. Patient was informed.  -Patient feels a lot better. She ate her meal. She is requesting to go home.  2.   elevated blood pressure initially now much improved. I have asked patient to keep a log of her blood pressure at home and discuss with primary care physician.   3.  Hypokalemia: Replete potassium  4.  Anxiety: Amplifies the above symptoms.    5.  DVT prophylaxis: Lovenox  Discharge home. CONSULTS OBTAINED:  Treatment Team:  Leotis Pain, MD  DRUG ALLERGIES:   Allergies  Allergen Reactions  . Bacitra-Neomycin-Polymyxin-Hc Other (See Comments)    Pain and swelling of ear canal    DISCHARGE MEDICATIONS:   Allergies as of 12/08/2018      Reactions   Bacitra-neomycin-polymyxin-hc Other (See Comments)   Pain and swelling of ear  canal      Medication List    TAKE these medications   acetaminophen 500 MG tablet Commonly known as:  TYLENOL Take 500 mg by mouth every 6 (six) hours as needed for fever.   aspirin-acetaminophen-caffeine 250-250-65 MG tablet Commonly known as:  EXCEDRIN MIGRAINE Take 2 tablets by mouth every 6 (six) hours as needed for headache.   b complex vitamins tablet Take 1 tablet by mouth daily.   magnesium oxide 400 (241.3 Mg) MG tablet Commonly known as:  MAG-OX Take 1 tablet (400 mg total) by mouth daily. Start taking on:  Dec 09, 2018   zolmitriptan 5 MG tablet Commonly known as:  ZOMIG TAKE ONE TABLET BY MOUTH AT ON SET OF HEADACHE. CAN TAKE ONE MORE TABLET 2 HOURS LATER IF NEEDED. What changed:    how much to take  how to take this  when to take this  reasons to take this   zonisamide 100 MG capsule Commonly known as:  ZONEGRAN Take 2 capsules (200 mg total) by mouth daily.       If you experience worsening of your admission symptoms, develop shortness of breath, life threatening emergency, suicidal or homicidal thoughts you must seek medical attention immediately by calling 911 or calling your MD immediately  if symptoms less severe.  You Must read complete instructions/literature along with all the possible adverse reactions/side effects for all the Medicines you take and that have been prescribed to you. Take any new Medicines after you have completely  understood and accept all the possible adverse reactions/side effects.   Please note  You were cared for by a hospitalist during your hospital stay. If you have any questions about your discharge medications or the care you received while you were in the hospital after you are discharged, you can call the unit and asked to speak with the hospitalist on call if the hospitalist that took care of you is not available. Once you are discharged, your primary care physician will handle any further medical issues. Please note  that NO REFILLS for any discharge medications will be authorized once you are discharged, as it is imperative that you return to your primary care physician (or establish a relationship with a primary care physician if you do not have one) for your aftercare needs so that they can reassess your need for medications and monitor your lab values. Today   SUBJECTIVE   Head earlier mild headache. Improved after receiving IV morphine and Ativan in the ER. Denies any focal weakness. No difficulty swallowing. She was able to swallow water during my evaluation  VITAL SIGNS:  Blood pressure 138/77, pulse 74, temperature 98 F (36.7 C), temperature source Oral, resp. rate 18, height 5\' 8"  (1.727 m), weight 79 kg, last menstrual period 07/12/2011, SpO2 100 %.  I/O:    Intake/Output Summary (Last 24 hours) at 12/08/2018 1425 Last data filed at 12/08/2018 1350 Gross per 24 hour  Intake 240 ml  Output -  Net 240 ml    PHYSICAL EXAMINATION:  GENERAL:  43 y.o.-year-old patient lying in the bed with no acute distress.  EYES: Pupils equal, round, reactive to light and accommodation. No scleral icterus. Extraocular muscles intact.  HEENT: Head atraumatic, normocephalic. Oropharynx and nasopharynx clear.  NECK:  Supple, no jugular venous distention. No thyroid enlargement, no tenderness.  LUNGS: Normal breath sounds bilaterally, no wheezing, rales,rhonchi or crepitation. No use of accessory muscles of respiration.  CARDIOVASCULAR: S1, S2 normal. No murmurs, rubs, or gallops.  ABDOMEN: Soft, non-tender, non-distended. Bowel sounds present. No organomegaly or mass.  EXTREMITIES: No pedal edema, cyanosis, or clubbing.  NEUROLOGIC: Cranial nerves II through XII are intact. Muscle strength 5/5 in all extremities. Sensation intact. Gait not checked.  PSYCHIATRIC: The patient is alert and oriented x 3.  SKIN: No obvious rash, lesion, or ulcer.   DATA REVIEW:   CBC  Recent Labs  Lab 12/08/18 0325  WBC 7.7   HGB 12.3  HCT 36.7  PLT 249    Chemistries  Recent Labs  Lab 12/08/18 0325  NA 138  K 3.4*  CL 105  CO2 22  GLUCOSE 94  BUN 11  CREATININE 0.58  CALCIUM 8.5*    Microbiology Results   Recent Results (from the past 240 hour(s))  SARS Coronavirus 2 (CEPHEID - Performed in [facility] hospital lab), Hosp Order     Status: None   Collection Time: 12/08/18  4:26 AM  Result Value Ref Range Status   SARS Coronavirus 2 NEGATIVE NEGATIVE Final    Comment: (NOTE) If result is NEGATIVE SARS-CoV-2 target nucleic acids are NOT DETECTED. The SARS-CoV-2 RNA is generally detectable in upper and lower  respiratory specimens during the acute phase of infection. The lowest  concentration of SARS-CoV-2 viral copies this assay can detect is 250  copies / mL. A negative result does not preclude SARS-CoV-2 infection  and should not be used as the sole basis for treatment or other  patient management decisions.  A negative result may  occur with  improper specimen collection / handling, submission of specimen other  than nasopharyngeal swab, presence of viral mutation(s) within the  areas targeted by this assay, and inadequate number of viral copies  (<250 copies / mL). A negative result must be combined with clinical  observations, patient history, and epidemiological information. If result is POSITIVE SARS-CoV-2 target nucleic acids are DETECTED. The SARS-CoV-2 RNA is generally detectable in upper and lower  respiratory specimens dur ing the acute phase of infection.  Positive  results are indicative of active infection with SARS-CoV-2.  Clinical  correlation with patient history and other diagnostic information is  necessary to determine patient infection status.  Positive results do  not rule out bacterial infection or co-infection with other viruses. If result is PRESUMPTIVE POSTIVE SARS-CoV-2 nucleic acids MAY BE PRESENT.   A presumptive positive result was obtained on the submitted  specimen  and confirmed on repeat testing.  While 2019 novel coronavirus  (SARS-CoV-2) nucleic acids may be present in the submitted sample  additional confirmatory testing may be necessary for epidemiological  and / or clinical management purposes  to differentiate between  SARS-CoV-2 and other Sarbecovirus currently known to infect humans.  If clinically indicated additional testing with an alternate test  methodology [phone]) is advised. The SARS-CoV-2 RNA is generally  detectable in upper and lower respiratory sp ecimens during the acute  phase of infection. The expected result is Negative. Fact Sheet for Patients:  StrictlyIdeas.no Fact Sheet for Healthcare Providers: BankingDealers.co.za This test is not yet approved or cleared by the [place] FDA and has been authorized for detection and/or diagnosis of SARS-CoV-2 by FDA under an Emergency Use Authorization (EUA).  This EUA will remain in effect (meaning this test can be used) for the duration of the COVID-19 declaration under Section 564(b)(1) of the Act, 21 U.S.C. section 360bbb-3(b)(1), unless the authorization is terminated or revoked sooner. Performed at [facility], [address] [place], [place] [address]   MRSA PCR Screening     Status: None   Collection Time: 12/08/18 10:24 AM  Result Value Ref Range Status   MRSA by PCR NEGATIVE NEGATIVE Final    Comment:        The GeneXpert MRSA Assay (FDA approved for NASAL specimens only), is one component of a comprehensive MRSA colonization surveillance program. It is not intended to diagnose MRSA infection nor to guide or monitor treatment for MRSA infections. Performed at [facility], [address] [place], [place] [address]     RADIOLOGY:  Ct Head Wo Contrast  Result Date: 12/08/2018 CLINICAL DATA:  Headache EXAM: CT HEAD WITHOUT CONTRAST TECHNIQUE: Contiguous axial images were  obtained from the base of the skull through the vertex without intravenous contrast. COMPARISON:  03/13/2015 FINDINGS: Brain: No acute territorial infarction, hemorrhage, or intracranial mass. The ventricles are nonenlarged. Vascular: No hyperdense vessel or unexpected calcification. Skull: Normal. Negative for fracture or focal lesion. Sinuses/Orbits: No acute finding. Other: None IMPRESSION: Negative non contrasted CT appearance of the brain Electronically Signed   By: Donavan Foil M.D.   On: 12/08/2018 03:35   Mr Brain Wo Contrast  Result Date: 12/08/2018 CLINICAL DATA:  Headache with history of migraines. Burning sensation in the head with right facial numbness. Difficulty swallowing. EXAM: MRI HEAD WITHOUT CONTRAST TECHNIQUE: Multiplanar, multiecho pulse sequences of the brain and surrounding structures were obtained without intravenous contrast. COMPARISON:  Head CT 12/08/2018 FINDINGS: Brain: There is susceptibility artifact from a right-sided earring which  the patient was unable to remove. This obscures the lateral aspect of the right cerebellar hemisphere and a portion of the right temporal lobe, particularly on diffusion weighted and T2* gradient echo sequences limiting assessment for infarct and hemorrhage in these areas. No acute infarct, intracranial hemorrhage, mass, midline shift, or extra-axial fluid collection is identified. Small foci of T2 hyperintensity are noted in the predominantly subcortical white matter of the frontal lobes, overall mild though abnormal for age. The periventricular white matter is largely spared. The ventricles and sulci are normal. Vascular: Major intracranial vascular flow voids are preserved. Skull and upper cervical spine: Unremarkable bone marrow signal. Sinuses/Orbits: Unremarkable orbits. Clear paranasal sinuses. Trace mastoid effusions. Other: None. IMPRESSION: 1. No acute intracranial abnormality. 2. Mild cerebral white matter T2 signal changes, nonspecific  though may relate to the patient's history of migraines or reflect premature chronic small vessel ischemic disease. Electronically Signed   By: Logan Bores M.D.   On: 12/08/2018 09:35   US Carotid Bilateral (at [facility] Only)  Result Date: 12/08/2018 CLINICAL DATA:  TIA, hypertension EXAM: BILATERAL CAROTID DUPLEX ULTRASOUND TECHNIQUE: Pearline Cables scale imaging, color Doppler and duplex ultrasound were performed of bilateral carotid and vertebral arteries in the neck. COMPARISON:  None. FINDINGS: Criteria: Quantification of carotid stenosis is based on velocity parameters that correlate the residual internal carotid diameter with NASCET-based stenosis levels, using the diameter of the distal internal carotid lumen as the denominator for stenosis measurement. The following velocity measurements were obtained: RIGHT ICA: 134/55 cm/sec CCA: 33/82 cm/sec SYSTOLIC ICA/CCA RATIO:  1.7 ECA: 85 cm/sec LEFT ICA: 134/61 cm/sec CCA: 50/53 cm/sec SYSTOLIC ICA/CCA RATIO:  1.6 ECA: 84 cm/sec RIGHT CAROTID ARTERY: Minor intimal thickening without significant atherosclerosis. No hemodynamically significant right ICA stenosis, velocity elevation, or turbulent flow. Degree of narrowing less than 50%. RIGHT VERTEBRAL ARTERY:  Antegrade LEFT CAROTID ARTERY: Similar minor intimal thickening and early atherosclerosis. No hemodynamically significant left ICA stenosis, velocity elevation, or turbulent flow. LEFT VERTEBRAL ARTERY:  Antegrade IMPRESSION: Minor carotid intimal thickening and early atherosclerosis. No hemodynamically significant ICA stenosis. Degree of narrowing less than 50% bilaterally by ultrasound criteria. Patent antegrade vertebral flow bilaterally Electronically Signed   By: Jerilynn Mages.  Shick M.D.   On: 12/08/2018 10:17     CODE STATUS:     Code Status Orders  (From admission, onward)         Start     Ordered   12/08/18 0632  Full code  Continuous     12/08/18 0631        Code Status History    Date Active  Date Inactive Code Status Order ID Comments User Context   06/15/2016 0953 06/16/2016 0305 Full Code [id number]  Kinsinger, Arta Bruce, MD HOV   06/11/2016 1014 06/12/2016 0305 Full Code [id number]  Greer Pickerel, MD HOV   06/10/2016 1507 06/11/2016 1014 Full Code [id number]  Kinsinger, Arta Bruce, MD Outpatient   05/18/2016 1629 05/20/2016 1449 Full Code [id number]  Kinsinger, Arta Bruce, MD Inpatient    Advance Directive Documentation     Most Recent Value  Type of Advance Directive  Living will  Pre-existing out of facility DNR order (yellow form or pink MOST form)  -  "MOST" Form in Place?  -      TOTAL TIME TAKING CARE OF THIS PATIENT: *40* minutes.    Fritzi Mandes M.D on 12/08/2018 at 2:25 PM  Between 7am to 6pm - Pager - [phone] After 6pm go to [email or web address] - password  [email or web address]  [facility]  Office  [phone]  CC: Primary care physician; Jearld Fenton, NP

## 2018-12-08 NOTE — ED Triage Notes (Addendum)
Pt reports she woke on 5/28 with migraine symptoms that worsened throughout day. Pt went to bed at 2300 with normal facial features and no difficulty swallowing. Pt woke at 0200 this AM with increased pain and "burning" all over head. Pt also reports difficulty swallowing, airway clear on assessment. Pt has left sided facial droop to left eye and mouth with decreased sensation to the right side of face. MD contacted. Pt taken to CT.

## 2018-12-08 NOTE — Discharge Instructions (Signed)
Patient will follow-up with her new neurologist as outpatient.

## 2018-12-08 NOTE — Progress Notes (Signed)
Patient discharged home per MD order. All discharge instructions given and all questions answered. 

## 2018-12-08 NOTE — Evaluation (Addendum)
Occupational Therapy Evaluation Patient Details Name: Linda [name] MRN: [id number] DOB: 1975/10/22 Today's Date: 12/08/2018    History of Present Illness Yajaira Doffing is a 43 yo female who comes to [facility] on 5/[facility] c migrain symptoms, as well as total head burning sensation (not similar to scalp burning), and chest pain. Pt also reported some LUE/LLE weakness. Initially, code stroke was called, but then pt was foudn to be having a complex migraine HA. At baseline no gait or balance abnormality.    Clinical Impression   Pt. presents with LUE weakness, and impaired [facility] skills which limit her ability to complete basic ADL and IADL functioning. Pt. resides at home with her 4 children. Pt. was independent with ADLs, and IADL functioning: including: meal preparation, and medication management. 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 Pt. education was provided about [facility] activities to improve hand function, and [facility] skills. Pt. was provided with a visual handout. Pt. education was provided about proximal forearm stabilization to decrease tremors for increased motor control, and coordination. Pt. could benefit from follow-up OT services upon discharge.    Follow Up Recommendations  Outpatient OT    Equipment Recommendations  None recommended by OT    Recommendations for Other Services       Precautions / Restrictions Precautions Precautions: None Restrictions Weight Bearing Restrictions: No      Mobility Bed Mobility Overal bed mobility: Independent                Transfers Overall transfer level: Independent Equipment used: None                  Balance Overall balance assessment: Independent                                         ADL either performed or assessed with clinical judgement   ADL Overall ADL's : Needs assistance/impaired Eating/Feeding: Independent   Grooming: Independent   Upper Body Bathing: Independent    Lower Body Bathing: Independent       Lower Body Dressing: Independent   Toilet Transfer: Independent                   Vision Patient Visual Report: Blurring of vision(Blurring of vision early this a.m. resolved this afternnon)       Perception     Praxis      Pertinent Vitals/Pain Pain Assessment: No/denies pain     Hand Dominance     Extremity/Trunk Assessment Upper Extremity Assessment Upper Extremity Assessment: Left shoulder strength: 4-/5 abduction, flexion, 4/5 elbow flexion, extension, and wrist extension. Decreased grip strength and pinch strength. Impaired FMC. Intermittent BUE tremors per pt. Report.     Cervical / Trunk Assessment Cervical / Trunk Assessment: Normal   Communication     Cognition Arousal/Alertness: Awake/alert Behavior During Therapy: WFL for tasks assessed/performed Overall Cognitive Status: Within Functional Limits for tasks assessed                                     General Comments       Exercises     Shoulder Instructions      Home Living Family/patient expects to be discharged to:: Private residence Living Arrangements: Children Available Help at Discharge: Family Type of Home: House  Home Access: Stairs to enter Entrance Stairs-Number of Steps: 4   Home Layout: Two level Alternate Level Stairs-Number of Steps: 16             Home Equipment: None          Prior Functioning/Environment                   OT Problem List: Decreased strength;Decreased coordination      OT Treatment/Interventions: Self-care/ADL training;Therapeutic exercise;DME and/or AE instruction;Patient/family education;Therapeutic activities    OT Goals(Current goals can be found in the care plan section) Acute Rehab OT Goals Patient Stated Goal: To return home OT Goal Formulation: With patient Potential to Achieve Goals: Good  OT Frequency: Min 2X/week   Barriers to D/C:            Co-evaluation               AM-PAC OT "6 Clicks" Daily Activity     Outcome Measure Help from another person eating meals?: None Help from another person taking care of personal grooming?: None Help from another person toileting, which includes using toliet, bedpan, or urinal?: None Help from another person bathing (including washing, rinsing, drying)?: None Help from another person to put on and taking off regular upper body clothing?: None Help from another person to put on and taking off regular lower body clothing?: None 6 Click Score: 24   End of Session    Activity Tolerance: Patient tolerated treatment well Patient left:                     Time: 1335-1405 OT Time Calculation (min): 30 min Charges:  OT General Charges $OT Visit: 1 Visit OT Evaluation $OT Eval Moderate Complexity: 1 Mod   Harrel Carina, MS, OTR/L  Harrel Carina 12/08/2018, 4:16 PM

## 2018-12-08 NOTE — ED Provider Notes (Signed)
[facility] Emergency Department Provider Note   ____________________________________________   First MD Initiated Contact with Patient 12/08/18 [phone]     (approximate)  I have reviewed the triage vital signs and the nursing notes.   HISTORY  Chief Complaint Weakness and Migraine    HPI Linda [name] is a 43 y.o. female who presents to the ED from home with a chief complaint of numbness and weakness.  Patient has a history of migraine headaches who has had a migraine for the past day.  Started in the right side of her head which is typical for patient.  Associated with mild nausea, no vomiting.  Patient took Zomig and went to bed at approximately 11 PM.  Awoke around 2 AM with a burning sensation all over her head and right facial numbness.  Also reports difficulty swallowing.  She was alone so does not know about facial droop or slurred speech.  Other than migraine headache, denies recent fever, cough, chest pain, shortness of breath, abdominal pain, vomiting.  Denies recent travel, trauma or exposure to persons diagnosed with coronavirus.       Past Medical History:  Diagnosis Date   Anxiety    Asthma    GERD (gastroesophageal reflux disease)    Heart murmur    while pregnant   Migraine     Patient Active Problem List   Diagnosis Date Noted   Complicated migraine 16/01/3709   GERD (gastroesophageal reflux disease) 01/03/2017   Seasonal allergies 01/03/2017   Migraine without aura and without status migrainosus, not intractable 08/04/2015   Anxiety 04/15/2014    Past Surgical History:  Procedure Laterality Date   ABDOMINAL HYSTERECTOMY  12/20012   Total   ENDOMETRIAL ABLATION  2012   GASTRIC ROUX-EN-Y N/A 05/18/2016   Procedure: LAPAROSCOPIC ROUX-EN-Y GASTRIC WITH UPPER ENDO;  Surgeon: Arta Bruce Kinsinger, MD;  Location: [facility];  Service: General;  Laterality: N/A;   MYRINGOTOMY WITH TUBE PLACEMENT     X 3 SINCE ORRIGINALLY  PLACE IN 2010   TONSILECTOMY, ADENOIDECTOMY, BILATERAL MYRINGOTOMY AND TUBES  2010   TONSILLECTOMY     TUBAL LIGATION  08/2010   UPPER GI ENDOSCOPY  05/18/2016   Procedure: UPPER GI ENDOSCOPY;  Surgeon: Arta Bruce Kinsinger, MD;  Location: [facility];  Service: General;;    Prior to Admission medications   Medication Sig Start Date End Date Taking? Authorizing Provider  aspirin-acetaminophen-caffeine (EXCEDRIN MIGRAINE) [phone] MG tablet Take 2 tablets by mouth every 6 (six) hours as needed for headache.   Yes [provider]  zolmitriptan (ZOMIG) 5 MG tablet TAKE ONE TABLET BY MOUTH AT ON SET OF HEADACHE. CAN TAKE ONE MORE TABLET 2 HOURS LATER IF NEEDED. Patient taking differently: Take 5 mg by mouth daily as needed for migraine. TAKE ONE TABLET BY MOUTH AT ON SET OF HEADACHE. CAN TAKE ONE MORE TABLET 2 HOURS LATER IF NEEDED. 09/15/17  Yes Tomi Likens, Adam R, DO  zonisamide (ZONEGRAN) 100 MG capsule Take 2 capsules (200 mg total) by mouth daily. 07/06/17  Yes Tomi Likens, Adam R, DO  acetaminophen (TYLENOL) 500 MG tablet Take 500 mg by mouth every 6 (six) hours as needed for fever.    [provider]    Allergies Bacitra-neomycin-polymyxin-hc  Family History  Problem Relation Age of Onset   Hypertension Father    Diabetes Father    Hypertension Mother    Cancer Mother        uterine   Stroke Mother  Diabetes Maternal Aunt    Diabetes Maternal Uncle    Cancer Maternal Grandmother        PANCREATIC   Hypertension Maternal Grandmother    Diabetes Maternal Grandmother    Cancer Maternal Grandfather        LUNG   Hypertension Maternal Grandfather    Diabetes Maternal Grandfather    Cancer Paternal Grandmother        THROAT   Alzheimer's disease Paternal Grandmother     Social History Social History   Tobacco Use   Smoking status: Never Smoker   Smokeless tobacco: Never Used  Substance Use Topics   Alcohol use: Yes    Alcohol/week: 1.0 standard  drinks    Types: 1 Standard drinks or equivalent per week    Comment: occasional (not much)   Drug use: No    Review of Systems  Constitutional: No fever/chills Eyes: No visual changes. ENT: No sore throat. Cardiovascular: Denies chest pain. Respiratory: Denies shortness of breath. Gastrointestinal: No abdominal pain.  No nausea, no vomiting.  No diarrhea.  No constipation. Genitourinary: Negative for dysuria. Musculoskeletal: Negative for back pain. Skin: Negative for rash. Neurological: Positive for headaches, focal weakness and numbness.   ____________________________________________   PHYSICAL EXAM:  VITAL SIGNS: ED Triage Vitals [12/08/18 0335]  Enc Vitals Group     BP (!) 154/96     Pulse Rate 71     Resp 18     Temp 98.7 F (37.1 C)     Temp Source Oral     SpO2 99 %     Weight      Height      Head Circumference      Peak Flow      Pain Score      Pain Loc      Pain Edu?      Excl. in [place]?     Constitutional: Alert and oriented. Well appearing and in no acute distress. Eyes: Conjunctivae are normal. PERRL. EOMI. Head: Atraumatic. Nose: No congestion/rhinnorhea. Mouth/Throat: Mucous membranes are moist.  Oropharynx non-erythematous. Neck: No stridor.  No carotid bruits.  Supple neck without meningismus. Cardiovascular: Normal rate, regular rhythm. Grossly normal heart sounds.  Good peripheral circulation. Respiratory: Normal respiratory effort.  No retractions. Lungs CTAB. Gastrointestinal: Soft and nontender. No distention. No abdominal bruits. No CVA tenderness. Musculoskeletal: No lower extremity tenderness nor edema.  No joint effusions. Neurologic: Alert and oriented x3.  CN II-XII grossly intact except for very mild left facial droop.  Diminished sensation left face.  Normal speech and language.  Slightly diminished left grip compared to the right. Skin:  Skin is warm, dry and intact. No rash noted. Psychiatric: Mood and affect are normal. Speech  and behavior are normal.  ____________________________________________   LABS (all labs ordered are listed, but only abnormal results are displayed)  Labs Reviewed  BASIC METABOLIC PANEL - Abnormal; Notable for the following components:      Result Value   Potassium 3.4 (*)    Calcium 8.5 (*)    All other components within normal limits  SARS CORONAVIRUS 2 (HOSPITAL ORDER, [facility])  CBC  TROPONIN I   ____________________________________________  EKG  ED ECG REPORT I, Nira Visscher J, the attending physician, personally viewed and interpreted this ECG.   Date: 12/08/2018  EKG Time: 0320  Rate: 83  Rhythm: normal EKG, normal sinus rhythm  Axis: Normal  Intervals:none  ST&T Change: Nonspecific  ____________________________________________  RADIOLOGY  ED  MD interpretation: No ICH  Official radiology report(s): Ct Head Wo Contrast  Result Date: 12/08/2018 CLINICAL DATA:  Headache EXAM: CT HEAD WITHOUT CONTRAST TECHNIQUE: Contiguous axial images were obtained from the base of the skull through the vertex without intravenous contrast. COMPARISON:  03/13/2015 FINDINGS: Brain: No acute territorial infarction, hemorrhage, or intracranial mass. The ventricles are nonenlarged. Vascular: No hyperdense vessel or unexpected calcification. Skull: Normal. Negative for fracture or focal lesion. Sinuses/Orbits: No acute finding. Other: None IMPRESSION: Negative non contrasted CT appearance of the brain Electronically Signed   By: Donavan Foil M.D.   On: 12/08/2018 03:35    ____________________________________________   PROCEDURES  Procedure(s) performed (including Critical Care):  Procedures  NIH Stroke Scale  Interval: Baseline Time: 5:49 AM Person Administering Scale: Gaige Sebo J  Administer stroke scale items in the order listed. Record performance in each category after each subscale exam. Do not go back and change scores. Follow directions  provided for each exam technique. Scores should reflect what the patient does, not what the clinician thinks the patient can do. The clinician should record answers while administering the exam and work quickly. Except where indicated, the patient should not be coached (i.e., repeated requests to patient to make a special effort).   1a  Level of consciousness: 0=alert; keenly responsive  1b. LOC questions:  0=Performs both tasks correctly  1c. LOC commands: 0=Performs both tasks correctly  2.  Best Gaze: 0=normal  3.  Visual: 0=No visual loss  4. Facial Palsy: 1=Minor paralysis (flattened nasolabial fold, asymmetric on smiling)  5a.  Motor left arm: 1=Drift, limb holds 90 (or 45) degrees but drifts down before full 10 seconds: does not hit bed  5b.  Motor right arm: 0=No drift, limb holds 90 (or 45) degrees for full 10 seconds  6a. motor left leg: 0=No drift, limb holds 90 (or 45) degrees for full 10 seconds  6b  Motor right leg:  0=No drift, limb holds 90 (or 45) degrees for full 10 seconds  7. Limb Ataxia: 0=Absent  8.  Sensory: 1=Mild to moderate sensory loss; patient feels pinprick is less sharp or is dull on the affected side; there is a loss of superficial pain with pinprick but patient is aware She is being touched  9. Best Language:  0=No aphasia, normal  10. Dysarthria: 0=Normal  11. Extinction and Inattention: 0=No abnormality  12. Distal motor function: 0=Normal   Total:   3   ____________________________________________   INITIAL IMPRESSION / ASSESSMENT AND PLAN / ED COURSE  As part of my medical decision making, I reviewed the following data within the [facility] notes reviewed and incorporated, Labs reviewed, EKG interpreted, Old chart reviewed, Radiograph reviewed, Discussed with admitting physician Dr. Marcille Blanco and Notes from prior ED visits     Akshitha HALAYNA BLANE was evaluated in Emergency Department on 12/08/2018 for the symptoms described in the  history of present illness. She was evaluated in the context of the global COVID-19 pandemic, which necessitated consideration that the patient might be at risk for infection with the SARS-CoV-2 virus that causes COVID-19. Institutional protocols and algorithms that pertain to the evaluation of patients at risk for COVID-19 are in a state of rapid change based on information released by regulatory bodies including the CDC and federal and state organizations. These policies and algorithms were followed during the patient's care in the ED.   43 year old female with migraines who presents with facial numbness and left arm weakness.  Differential  diagnosis includes but is not limited to CVA, TIA, complex migraine, infectious, metabolic etiologies, etc.  NIH stroke scale is 3.  Patient is not a candidate for TPA based on time window and severity of symptoms.  Will obtain basic lab work and discuss with hospitalist to evaluate patient in the emergency department for admission for rule out stroke.  Clinical Course as of Dec 08 547  Fri Dec 08, 2018  0439 Patient now with chest pain.  Failed swallow study.  Will administer rectal aspirin.   [JS]    Clinical Course User Index [JS] Paulette Blanch, MD     ____________________________________________   FINAL CLINICAL IMPRESSION(S) / ED DIAGNOSES  Final diagnoses:  Other migraine without status migrainosus, not intractable  Weakness     ED Discharge Orders    None       Note:  This document was prepared using Dragon voice recognition software and may include unintentional dictation errors.   Paulette Blanch, MD 12/08/18 [phone]

## 2018-12-08 NOTE — Plan of Care (Signed)
  Problem: Education: Goal: Knowledge of disease or condition will improve Outcome: Progressing Goal: Knowledge of secondary prevention will improve Outcome: Progressing Goal: Knowledge of patient specific risk factors addressed and post discharge goals established will improve Outcome: Progressing   Problem: Coping: Goal: Will verbalize positive feelings about self Outcome: Progressing Goal: Will identify appropriate support needs Outcome: Progressing   Problem: Health Behavior/Discharge Planning: Goal: Ability to manage health-related needs will improve Outcome: Progressing   Problem: Self-Care: Goal: Ability to participate in self-care as condition permits will improve Outcome: Progressing Goal: Verbalization of feelings and concerns over difficulty with self-care will improve Outcome: Progressing Goal: Ability to communicate needs accurately will improve Outcome: Progressing   Problem: Nutrition: Goal: Risk of aspiration will decrease Outcome: Progressing Goal: Dietary intake will improve Outcome: Progressing   Problem: Ischemic Stroke/TIA Tissue Perfusion: Goal: Complications of ischemic stroke/TIA will be minimized Outcome: Progressing   Problem: Education: Goal: Knowledge of General Education information will improve Description: Including pain rating scale, medication(s)/side effects and non-pharmacologic comfort measures Outcome: Progressing   Problem: Health Behavior/Discharge Planning: Goal: Ability to manage health-related needs will improve Outcome: Progressing   Problem: Clinical Measurements: Goal: Ability to maintain clinical measurements within normal limits will improve Outcome: Progressing Goal: Will remain free from infection Outcome: Progressing Goal: Diagnostic test results will improve Outcome: Progressing Goal: Respiratory complications will improve Outcome: Progressing Goal: Cardiovascular complication will be avoided Outcome:  Progressing   Problem: Activity: Goal: Risk for activity intolerance will decrease Outcome: Progressing   Problem: Nutrition: Goal: Adequate nutrition will be maintained Outcome: Progressing   Problem: Coping: Goal: Level of anxiety will decrease Outcome: Progressing   Problem: Elimination: Goal: Will not experience complications related to bowel motility Outcome: Progressing Goal: Will not experience complications related to urinary retention Outcome: Progressing   Problem: Pain Managment: Goal: General experience of comfort will improve Outcome: Progressing   Problem: Safety: Goal: Ability to remain free from injury will improve Outcome: Progressing   Problem: Skin Integrity: Goal: Risk for impaired skin integrity will decrease Outcome: Progressing   

## 2018-12-08 NOTE — H&P (Signed)
Linda [name] is an 43 y.o. female.   Chief Complaint: Face numbness HPI: The patient with past medical history of migraine headaches, recurrent ear infection and anxiety presents emergency department complaining of right side facial numbness as well as a burning sensation all over her head.  The patient reports that she had a migraine earlier today as well as intermittent chest pain.  She denies shortness of breath, nausea, vomiting or diaphoresis.  She went to sleep but awoke approximately 3 hours prior to admission complaining of left side facial weakness, numbness of her right cheek and subjective weakness of her left hand.  CT of her head was normal and the patient symptoms appear to wane in severity.  However due to her symptoms the emergency department staff called the hospitalist for further evaluation  Past Medical History:  Diagnosis Date  . Anxiety   . Asthma   . GERD (gastroesophageal reflux disease)   . Heart murmur    while pregnant  . Migraine     Past Surgical History:  Procedure Laterality Date  . ABDOMINAL HYSTERECTOMY  12/20012   Total  . ENDOMETRIAL ABLATION  2012  . GASTRIC ROUX-EN-Y N/A 05/18/2016   Procedure: LAPAROSCOPIC ROUX-EN-Y GASTRIC WITH UPPER ENDO;  Surgeon: Arta Bruce Kinsinger, MD;  Location: [facility];  Service: General;  Laterality: N/A;  . MYRINGOTOMY WITH TUBE PLACEMENT     X 3 SINCE ORRIGINALLY PLACE IN 2010  . TONSILECTOMY, ADENOIDECTOMY, BILATERAL MYRINGOTOMY AND TUBES  2010  . TONSILLECTOMY    . TUBAL LIGATION  08/2010  . UPPER GI ENDOSCOPY  05/18/2016   Procedure: UPPER GI ENDOSCOPY;  Surgeon: Arta Bruce Kinsinger, MD;  Location: [facility];  Service: General;;    Family History  Problem Relation Age of Onset  . Hypertension Father   . Diabetes Father   . Hypertension Mother   . Cancer Mother        uterine  . Stroke Mother   . Diabetes Maternal Aunt   . Diabetes Maternal Uncle   . Cancer Maternal Grandmother        PANCREATIC  .  Hypertension Maternal Grandmother   . Diabetes Maternal Grandmother   . Cancer Maternal Grandfather        LUNG  . Hypertension Maternal Grandfather   . Diabetes Maternal Grandfather   . Cancer Paternal Grandmother        THROAT  . Alzheimer's disease Paternal Grandmother    Social History:  reports that she has never smoked. She has never used smokeless tobacco. She reports current alcohol use of about 1.0 standard drinks of alcohol per week. She reports that she does not use drugs.  Allergies:  Allergies  Allergen Reactions  . Bacitra-Neomycin-Polymyxin-Hc Other (See Comments)    Pain and swelling of ear canal    Medications Prior to Admission  Medication Sig Dispense Refill  . aspirin-acetaminophen-caffeine (EXCEDRIN MIGRAINE) 250-250-65 MG tablet Take 2 tablets by mouth every 6 (six) hours as needed for headache.    . zolmitriptan (ZOMIG) 5 MG tablet TAKE ONE TABLET BY MOUTH AT ON SET OF HEADACHE. CAN TAKE ONE MORE TABLET 2 HOURS LATER IF NEEDED. (Patient taking differently: Take 5 mg by mouth daily as needed for migraine. TAKE ONE TABLET BY MOUTH AT ON SET OF HEADACHE. CAN TAKE ONE MORE TABLET 2 HOURS LATER IF NEEDED.) 10 tablet 5  . zonisamide (ZONEGRAN) 100 MG capsule Take 2 capsules (200 mg total) by mouth daily. 60 capsule 11  . acetaminophen (  TYLENOL) 500 MG tablet Take 500 mg by mouth every 6 (six) hours as needed for fever.      Results for orders placed or performed during the hospital encounter of 12/08/18 (from the past 48 hour(s))  Basic metabolic panel     Status: Abnormal   Collection Time: 12/08/18  3:25 AM  Result Value Ref Range   Sodium 138 135 - 145 mmol/L   Potassium 3.4 (L) 3.5 - 5.1 mmol/L   Chloride 105 98 - 111 mmol/L   CO2 22 22 - 32 mmol/L   Glucose, Bld 94 70 - 99 mg/dL   BUN 11 6 - 20 mg/dL   Creatinine, Ser 0.58 0.44 - 1.00 mg/dL   Calcium 8.5 (L) 8.9 - 10.3 mg/dL   GFR calc non Af Amer >60 >60 mL/min   GFR calc Af Amer >60 >60 mL/min   Anion  gap 11 5 - 15    Comment: Performed at [facility], [address] [place], [place] [address]  CBC     Status: None   Collection Time: 12/08/18  3:25 AM  Result Value Ref Range   WBC 7.7 4.0 - 10.5 K/uL   RBC 3.89 3.87 - 5.11 MIL/uL   Hemoglobin 12.3 12.0 - 15.0 g/dL   HCT 36.7 36.0 - 46.0 %   MCV 94.3 80.0 - 100.0 fL   MCH 31.6 26.0 - 34.0 pg   MCHC 33.5 30.0 - 36.0 g/dL   RDW 13.2 11.5 - 15.5 %   Platelets 249 150 - 400 K/uL   nRBC 0.0 0.0 - 0.2 %    Comment: Performed at [facility], [address] [place], [place] [address]  Troponin I - ONCE - STAT     Status: None   Collection Time: 12/08/18  3:25 AM  Result Value Ref Range   Troponin I <0.03 <0.03 ng/mL    Comment: Performed at [facility], [address] [place], [place] [address]  TSH     Status: None   Collection Time: 12/08/18  3:25 AM  Result Value Ref Range   TSH 2.200 0.350 - 4.500 uIU/mL    Comment: Performed by a 3rd Generation assay with a functional sensitivity of <=0.01 uIU/mL. Performed at [facility], [address], [place], [place] [address]   SARS Coronavirus 2 (CEPHEID - Performed in [facility] hospital lab), Hosp Order     Status: None   Collection Time: 12/08/18  4:26 AM  Result Value Ref Range   SARS Coronavirus 2 NEGATIVE NEGATIVE    Comment: (NOTE) If result is NEGATIVE SARS-CoV-2 target nucleic acids are NOT DETECTED. The SARS-CoV-2 RNA is generally detectable in upper and lower  respiratory specimens during the acute phase of infection. The lowest  concentration of SARS-CoV-2 viral copies this assay can detect is 250  copies / mL. A negative result does not preclude SARS-CoV-2 infection  and should not be used as the sole basis for treatment or other  patient management decisions.  A negative result may occur with  improper specimen collection / handling, submission of specimen other  than nasopharyngeal swab, presence of viral mutation(s) within  the  areas targeted by this assay, and inadequate number of viral copies  (<250 copies / mL). A negative result must be combined with clinical  observations, patient history, and epidemiological information. If result is POSITIVE SARS-CoV-2 target nucleic acids are DETECTED. The SARS-CoV-2 RNA is generally detectable in upper and lower  respiratory  specimens dur ing the acute phase of infection.  Positive  results are indicative of active infection with SARS-CoV-2.  Clinical  correlation with patient history and other diagnostic information is  necessary to determine patient infection status.  Positive results do  not rule out bacterial infection or co-infection with other viruses. If result is PRESUMPTIVE POSTIVE SARS-CoV-2 nucleic acids MAY BE PRESENT.   A presumptive positive result was obtained on the submitted specimen  and confirmed on repeat testing.  While 2019 novel coronavirus  (SARS-CoV-2) nucleic acids may be present in the submitted sample  additional confirmatory testing may be necessary for epidemiological  and / or clinical management purposes  to differentiate between  SARS-CoV-2 and other Sarbecovirus currently known to infect humans.  If clinically indicated additional testing with an alternate test  methodology [phone]) is advised. The SARS-CoV-2 RNA is generally  detectable in upper and lower respiratory sp ecimens during the acute  phase of infection. The expected result is Negative. Fact Sheet for Patients:  StrictlyIdeas.no Fact Sheet for Healthcare Providers: BankingDealers.co.za This test is not yet approved or cleared by the [place] FDA and has been authorized for detection and/or diagnosis of SARS-CoV-2 by FDA under an Emergency Use Authorization (EUA).  This EUA will remain in effect (meaning this test can be used) for the duration of the COVID-19 declaration under Section 564(b)(1) of the Act, 21  U.S.C. section 360bbb-3(b)(1), unless the authorization is terminated or revoked sooner. Performed at [facility], [place], [place] [address]    Ct Head Wo Contrast  Result Date: 12/08/2018 CLINICAL DATA:  Headache EXAM: CT HEAD WITHOUT CONTRAST TECHNIQUE: Contiguous axial images were obtained from the base of the skull through the vertex without intravenous contrast. COMPARISON:  03/13/2015 FINDINGS: Brain: No acute territorial infarction, hemorrhage, or intracranial mass. The ventricles are nonenlarged. Vascular: No hyperdense vessel or unexpected calcification. Skull: Normal. Negative for fracture or focal lesion. Sinuses/Orbits: No acute finding. Other: None IMPRESSION: Negative non contrasted CT appearance of the brain Electronically Signed   By: Donavan Foil M.D.   On: 12/08/2018 03:35    Review of Systems  Constitutional: Negative for chills and fever.  HENT: Negative for sore throat and tinnitus.   Eyes: Negative for blurred vision and redness.  Respiratory: Negative for cough and shortness of breath.   Cardiovascular: Negative for chest pain, palpitations, orthopnea and PND.  Gastrointestinal: Negative for abdominal pain, diarrhea, nausea and vomiting.  Genitourinary: Negative for dysuria, frequency and urgency.  Musculoskeletal: Negative for joint pain and myalgias.  Skin: Negative for rash.       No lesions  Neurological: Positive for sensory change and focal weakness. Negative for speech change and weakness.  Endo/Heme/Allergies: Does not bruise/bleed easily.       No temperature intolerance  Psychiatric/Behavioral: Negative for depression and suicidal ideas.    Blood pressure (!) 142/99, pulse 86, temperature 97.9 F (36.6 C), temperature source Oral, resp. rate 16, height 5\' 8"  (1.727 m), weight 79 kg, last menstrual period 07/12/2011, SpO2 100 %. Physical Exam  Vitals reviewed. Constitutional: She is oriented to person, place, and time. She  appears well-developed and well-nourished. No distress.  HENT:  Head: Normocephalic and atraumatic.  Mouth/Throat: Oropharynx is clear and moist.  Eyes: Pupils are equal, round, and reactive to light. Conjunctivae and EOM are normal. No scleral icterus.  Neck: Normal range of motion. Neck supple. No JVD present. No tracheal deviation present. No thyromegaly present.  Cardiovascular: Normal rate,  regular rhythm and normal heart sounds. Exam reveals no gallop and no friction rub.  No murmur heard. Respiratory: Effort normal and breath sounds normal.  GI: Soft. Bowel sounds are normal. She exhibits no distension. There is no abdominal tenderness.  Genitourinary:    Genitourinary Comments: Deferred   Musculoskeletal: Normal range of motion.        General: No edema.  Lymphadenopathy:    She has no cervical adenopathy.  Neurological: She is alert and oriented to person, place, and time. No cranial nerve deficit. She exhibits normal muscle tone.  Skin: Skin is warm and dry. No rash noted. No erythema.  Psychiatric: She has a normal mood and affect. Her behavior is normal. Judgment and thought content normal.     Assessment/Plan This is a 43 year old female admitted for complicated migraine. 1.  Migraine: Complicated by sensory change and mild inconsistent weakness of left hand.  CT brain shows no hemorrhage or ischemia.  The patient has a long history of complicated migraines.  Her head feels better at this time and feeling has returned to her face however she does have some subjective weakness of grip strength on the left.  MRI brain as well as carotid ultrasounds and echocardiogram ordered.  Consult neurology.  Once patient passes swallow screen she may resume her Zonegran. 2.  Hypertension: The patient does not carry this diagnosis but her blood pressure has been persistently elevated while in the hospital.  This may be due to pain or could be secondary to subacute stroke.  She also complained  of some chest tightness.  Follow cardiac enzymes.  EKG is normal.  I have not given her any antihypertensive medication at this time.  Await neurology input. 3.  Hypokalemia: Replete potassium 4.  Anxiety: Amplifies the above symptoms.  The patient feels dramatically better after Ativan and morphine. 5.  DVT prophylaxis: Lovenox 6.  GI prophylaxis: None The patient is a full code.  Time spent on admission orders and patient care approximately 45 minutes  Harrie Foreman, MD 12/08/2018, 7:23 AM

## 2018-12-08 NOTE — Progress Notes (Signed)
Discontinue stoke protocol orders per Dr Posey Pronto

## 2018-12-08 NOTE — Evaluation (Addendum)
Physical Therapy Evaluation Patient Details Name: Linda [name] MRN: [id number] DOB: 1975-11-13 Today's Date: 12/08/2018   History of Present Illness  Sherel Fennell is a 28yoF who [facility] on 5/[facility] c migrain symptoms, as well as total head burning sensation (not similar to scalp burning), and chest pain. Pt also reported some LUE/LLE weakness. Initially, code stroke was called, but then pt was foudn to be having a complex migraine HA. At baseline no gait or balance abnormality.   Clinical Impression  Pt performing all mobility independently, without gait instability, asymmetry, or weakness. Patient is at baseline, all education completed, and time is given to address all questions/concerns. No additional skilled PT services needed at this time, PT signing off. PT recommends daily ambulation ad lib or with nursing staff as needed to prevent deconditioning.      Follow Up Recommendations No PT follow up    Equipment Recommendations  None recommended by PT    Recommendations for Other Services       Precautions / Restrictions Precautions Precautions: None Restrictions Weight Bearing Restrictions: No      Mobility  Bed Mobility Overal bed mobility: Independent                Transfers Overall transfer level: Independent Equipment used: None                Ambulation/Gait Ambulation/Gait assistance: Independent Gait Distance (Feet): 350 Feet Assistive device: None Gait Pattern/deviations: WFL(Within Functional Limits) Gait velocity: 1.91m/s  Gait velocity interpretation: >2.62 ft/sec, indicative of community ambulatory    Stairs Stairs: Yes Stairs assistance: Independent Stair Management: No rails;Alternating pattern Number of Stairs: 10 General stair comments: baseline level  Wheelchair Mobility    Modified Rankin (Stroke Patients Only)       Balance Overall balance assessment: Independent                                            Pertinent Vitals/Pain Pain Assessment: No/denies pain    Home Living Family/patient expects to be discharged to:: Private residence Living Arrangements: Children Available Help at Discharge: Family Type of Home: House Home Access: Stairs to enter   Technical brewer of Steps: 4 Home Layout: Two level Home Equipment: None      Prior Function                 Hand Dominance        Extremity/Trunk Assessment   Upper Extremity Assessment Upper Extremity Assessment: Overall WFL for tasks assessed    Lower Extremity Assessment Lower Extremity Assessment: Overall WFL for tasks assessed    Cervical / Trunk Assessment Cervical / Trunk Assessment: Normal  Communication      Cognition Arousal/Alertness: Awake/alert Behavior During Therapy: WFL for tasks assessed/performed Overall Cognitive Status: Within Functional Limits for tasks assessed                                        General Comments      Exercises     Assessment/Plan    PT Assessment Patent does not need any further PT services  PT Problem List         PT Treatment Interventions      PT Goals (Current goals can be found in the Care Plan  section)  Acute Rehab PT Goals PT Goal Formulation: All assessment and education complete, DC therapy    Frequency     Barriers to discharge        Co-evaluation               AM-PAC PT "6 Clicks" Mobility  Outcome Measure Help needed turning from your back to your side while in a flat bed without using bedrails?: None Help needed moving from lying on your back to sitting on the side of a flat bed without using bedrails?: None Help needed moving to and from a bed to a chair (including a wheelchair)?: None Help needed standing up from a chair using your arms (e.g., wheelchair or bedside chair)?: None Help needed to walk in hospital room?: None Help needed climbing 3-5 steps with a railing? : None 6 Click Score:  24    End of Session   Activity Tolerance: Patient tolerated treatment well Patient left: in bed;with nursing/sitter in room Nurse Communication: Mobility status PT Visit Diagnosis: Other symptoms and signs involving the nervous system (R29.898)    Time: 1660-6004 PT Time Calculation (min) (ACUTE ONLY): 10 min   Charges:   PT Evaluation $PT Eval Low Complexity: 1 Low          2:40 PM, 12/08/18 Etta Grandchild, PT, DPT Physical Therapist - [facility]  [phone] ([place])   Acworth C 12/08/2018, 2:39 PM

## 2018-12-08 NOTE — ED Notes (Signed)
ED TO INPATIENT HANDOFF REPORT  ED Nurse Name and Phone #: Anda Kraft [phone]  S Name/Age/Gender Linda [name] 43 y.o. female Room/Bed: ED25A/ED25A  Code Status   Code Status: Prior  Home/SNF/Other Home Patient oriented to: self, place, time and situation Is this baseline? Yes   Triage Complete: Triage complete  Chief Complaint Facial Pain  Triage Note Pt reports she woke on 5/28 with migraine symptoms that worsened throughout day. Pt went to bed at 2300 with normal facial features and no difficulty swallowing. Pt woke at 0200 this AM with increased pain and "burning" all over head. Pt also reports difficulty swallowing, airway clear on assessment. Pt has left sided facial droop to left eye and mouth with decreased sensation to the right side of face. MD contacted. Pt taken to CT.    Allergies Allergies  Allergen Reactions  . Bacitra-Neomycin-Polymyxin-Hc Other (See Comments)    Pain and swelling of ear canal    Level of Care/Admitting Diagnosis ED Disposition    ED Disposition Condition [facility] Area: [place] [100120]  Level of Care: Med-Surg [16]  Covid Evaluation: Screening Protocol (No Symptoms)  Diagnosis: Complicated migraine [335456]  Admitting Physician: Harrie Foreman [[id number]]  Attending Physician: Harrie Foreman [phone]  PT Class (Do Not Modify): Observation [104]  PT Acc Code (Do Not Modify): Observation [10022]       B Medical/Surgery History Past Medical History:  Diagnosis Date  . Anxiety   . Asthma   . GERD (gastroesophageal reflux disease)   . Heart murmur    while pregnant  . Migraine    Past Surgical History:  Procedure Laterality Date  . ABDOMINAL HYSTERECTOMY  12/20012   Total  . ENDOMETRIAL ABLATION  2012  . GASTRIC ROUX-EN-Y N/A 05/18/2016   Procedure: LAPAROSCOPIC ROUX-EN-Y GASTRIC WITH UPPER ENDO;  Surgeon: Arta Bruce Kinsinger, MD;  Location: [facility];  Service: General;   Laterality: N/A;  . MYRINGOTOMY WITH TUBE PLACEMENT     X 3 SINCE ORRIGINALLY PLACE IN 2010  . TONSILECTOMY, ADENOIDECTOMY, BILATERAL MYRINGOTOMY AND TUBES  2010  . TONSILLECTOMY    . TUBAL LIGATION  08/2010  . UPPER GI ENDOSCOPY  05/18/2016   Procedure: UPPER GI ENDOSCOPY;  Surgeon: Arta Bruce Kinsinger, MD;  Location: [facility];  Service: General;;     A IV Location/Drains/Wounds Patient Lines/Drains/Airways Status   Active Line/Drains/Airways    Name:   Placement date:   Placement time:   Site:   Days:   Peripheral IV 12/08/18 Left Antecubital   12/08/18    0402    Antecubital   less than 1          Intake/Output Last 24 hours No intake or output data in the 24 hours ending 12/08/18 0530  Labs/Imaging Results for orders placed or performed during the hospital encounter of 12/08/18 (from the past 48 hour(s))  Basic metabolic panel     Status: Abnormal   Collection Time: 12/08/18  3:25 AM  Result Value Ref Range   Sodium 138 135 - 145 mmol/L   Potassium 3.4 (L) 3.5 - 5.1 mmol/L   Chloride 105 98 - 111 mmol/L   CO2 22 22 - 32 mmol/L   Glucose, Bld 94 70 - 99 mg/dL   BUN 11 6 - 20 mg/dL   Creatinine, Ser 0.58 0.44 - 1.00 mg/dL   Calcium 8.5 (L) 8.9 - 10.3 mg/dL   GFR calc non Af Amer >60 >60 mL/min  GFR calc Af Amer >60 >60 mL/min   Anion gap 11 5 - 15    Comment: Performed at [facility], [address] [place], [place] [address]  CBC     Status: None   Collection Time: 12/08/18  3:25 AM  Result Value Ref Range   WBC 7.7 4.0 - 10.5 K/uL   RBC 3.89 3.87 - 5.11 MIL/uL   Hemoglobin 12.3 12.0 - 15.0 g/dL   HCT 36.7 36.0 - 46.0 %   MCV 94.3 80.0 - 100.0 fL   MCH 31.6 26.0 - 34.0 pg   MCHC 33.5 30.0 - 36.0 g/dL   RDW 13.2 11.5 - 15.5 %   Platelets 249 150 - 400 K/uL   nRBC 0.0 0.0 - 0.2 %    Comment: Performed at [facility], [address] [place], [place] [address]  Troponin I - ONCE - STAT     Status: None   Collection Time: 12/08/18  3:25 AM   Result Value Ref Range   Troponin I <0.03 <0.03 ng/mL    Comment: Performed at [facility], [address] [place], [place] [address]   Ct Head Wo Contrast  Result Date: 12/08/2018 CLINICAL DATA:  Headache EXAM: CT HEAD WITHOUT CONTRAST TECHNIQUE: Contiguous axial images were obtained from the base of the skull through the vertex without intravenous contrast. COMPARISON:  03/13/2015 FINDINGS: Brain: No acute territorial infarction, hemorrhage, or intracranial mass. The ventricles are nonenlarged. Vascular: No hyperdense vessel or unexpected calcification. Skull: Normal. Negative for fracture or focal lesion. Sinuses/Orbits: No acute finding. Other: None IMPRESSION: Negative non contrasted CT appearance of the brain Electronically Signed   By: Donavan Foil M.D.   On: 12/08/2018 03:35    Pending Labs Unresulted Labs (From admission, onward)    Start     Ordered   12/08/18 0348  SARS Coronavirus 2 (CEPHEID - Performed in [facility] hospital lab), Hosp Order  (Asymptomatic Patients Labs)  Once,   STAT    Question:  Rule Out  Answer:  Yes   12/08/18 0348   Signed and Held  Creatinine, serum  (enoxaparin (LOVENOX)    CrCl >/= 30 ml/min)  Weekly,   R    Comments:  while on enoxaparin therapy    Signed and Held   Signed and Held  TSH  Add-on,   R     Signed and Held   Signed and Held  Hemoglobin A1c  Tomorrow morning,   R     Signed and Held   Signed and Held  Lipid panel  Tomorrow morning,   R    Comments:  Fasting    Signed and Held   Signed and Held  Troponin I - Once  Once,   R     Signed and Held          Vitals/Pain Today's Vitals   12/08/18 0335 12/08/18 0400 12/08/18 0403 12/08/18 0430  BP: (!) 154/96 (!) 143/96  (!) 157/91  Pulse: 71 70  (!) 103  Resp: 18 13  17   Temp: 98.7 F (37.1 C)     TempSrc: Oral     SpO2: 99% 100%  100%  PainSc:   6      Isolation Precautions No active isolations  Medications Medications  LORazepam (ATIVAN) injection 1 mg  (has no administration in time range)  morphine 2 MG/ML injection 1 mg (has no administration in time range)  aspirin suppository 300 mg (300 mg Rectal  Given 12/08/18 0439)    Mobility walks with person assist Low fall risk   Focused Assessments Neuro Assessment Handoff:  Swallow screen pass? No    NIH Stroke Scale ( + Modified Stroke Scale Criteria)  Interval: Initial Level of Consciousness (1a.)   : Alert, keenly responsive LOC Questions (1b. )   +: Answers both questions correctly LOC Commands (1c. )   + : Performs both tasks correctly Best Gaze (2. )  +: Normal Visual (3. )  +: No visual loss Facial Palsy (4. )    : Normal symmetrical movements Motor Arm, Left (5a. )   +: No drift Motor Arm, Right (5b. )   +: No drift Motor Leg, Left (6a. )   +: No drift Motor Leg, Right (6b. )   +: No drift Limb Ataxia (7. ): Present in one limb Sensory (8. )   +: Mild-to-moderate sensory loss, patient feels pinprick is less sharp or is dull on the affected side, or there is a loss of superficial pain with pinprick, but patient is aware of being touched Best Language (9. )   +: No aphasia Dysarthria (10. ): Normal Extinction/Inattention (11.)   +: No Abnormality Modified SS Total  +: 1 Complete NIHSS TOTAL: 2 Last date known well: 12/07/18   Neuro Assessment: Within Defined Limits Neuro Checks:   Initial (12/08/18 0421)  Last Documented NIHSS Modified Score: 1 (12/08/18 0425) Has TPA been given? No If patient is a Neuro Trauma and patient is going to OR before floor call report to [place] nurse: [phone] or [phone]     R Recommendations: See Admitting Provider Note  Report given to:   Additional Notes:

## 2018-12-08 NOTE — ED Notes (Signed)
Patient kept NPO due to failed stroke swallow screen. Aspirin suppository given

## 2018-12-08 NOTE — Consult Note (Signed)
Reason for Consult: headache and facial droop  Referring Physician: Dr. Posey Pronto   CC: Headache and facial droop   HPI: Linda [name] is an 43 y.o. female with hx of  migraine headaches, recurrent ear infection and anxiety presents emergency department complaining of right side facial numbness as well as a burning sensation all over her head.  Symptoms associated with pressure diffuse headache.  Usually takes Zomig and daily zonegram for the migraines which occur 2x per month.  Now much improved and close to baseline.    Past Medical History:  Diagnosis Date  . Anxiety   . Asthma   . GERD (gastroesophageal reflux disease)   . Heart murmur    while pregnant  . Migraine     Past Surgical History:  Procedure Laterality Date  . ABDOMINAL HYSTERECTOMY  12/20012   Total  . ENDOMETRIAL ABLATION  2012  . GASTRIC ROUX-EN-Y N/A 05/18/2016   Procedure: LAPAROSCOPIC ROUX-EN-Y GASTRIC WITH UPPER ENDO;  Surgeon: Arta Bruce Kinsinger, MD;  Location: [facility];  Service: General;  Laterality: N/A;  . MYRINGOTOMY WITH TUBE PLACEMENT     X 3 SINCE ORRIGINALLY PLACE IN 2010  . TONSILECTOMY, ADENOIDECTOMY, BILATERAL MYRINGOTOMY AND TUBES  2010  . TONSILLECTOMY    . TUBAL LIGATION  08/2010  . UPPER GI ENDOSCOPY  05/18/2016   Procedure: UPPER GI ENDOSCOPY;  Surgeon: Arta Bruce Kinsinger, MD;  Location: [facility];  Service: General;;    Family History  Problem Relation Age of Onset  . Hypertension Father   . Diabetes Father   . Hypertension Mother   . Cancer Mother        uterine  . Stroke Mother   . Diabetes Maternal Aunt   . Diabetes Maternal Uncle   . Cancer Maternal Grandmother        PANCREATIC  . Hypertension Maternal Grandmother   . Diabetes Maternal Grandmother   . Cancer Maternal Grandfather        LUNG  . Hypertension Maternal Grandfather   . Diabetes Maternal Grandfather   . Cancer Paternal Grandmother        THROAT  . Alzheimer's disease Paternal Grandmother     Social History:   reports that she has never smoked. She has never used smokeless tobacco. She reports current alcohol use of about 1.0 standard drinks of alcohol per week. She reports that she does not use drugs.  Allergies  Allergen Reactions  . Bacitra-Neomycin-Polymyxin-Hc Other (See Comments)    Pain and swelling of ear canal    Medications: I have reviewed the patient's current medications.  ROS: History obtained from the patient  General ROS: negative for - chills, fatigue, fever, night sweats, weight gain or weight loss Psychological ROS: negative for - behavioral disorder, hallucinations, memory difficulties, mood swings or suicidal ideation Ophthalmic ROS: negative for - blurry vision, double vision, eye pain or loss of vision ENT ROS: negative for - epistaxis, nasal discharge, oral lesions, sore throat, tinnitus or vertigo Allergy and Immunology ROS: negative for - hives or itchy/watery eyes Hematological and Lymphatic ROS: negative for - bleeding problems, bruising or swollen lymph nodes Endocrine ROS: negative for - galactorrhea, hair pattern changes, polydipsia/polyuria or temperature intolerance Respiratory ROS: negative for - cough, hemoptysis, shortness of breath or wheezing Cardiovascular ROS: negative for - chest pain, dyspnea on exertion, edema or irregular heartbeat Gastrointestinal ROS: negative for - abdominal pain, diarrhea, hematemesis, nausea/vomiting or stool incontinence Genito-Urinary ROS: negative for - dysuria, hematuria, incontinence or urinary frequency/urgency  Musculoskeletal ROS: negative for - joint swelling or muscular weakness Neurological ROS: as noted in HPI Dermatological ROS: negative for rash and skin lesion changes  Physical Examination: Blood pressure 138/77, pulse 74, temperature 98 F (36.7 C), temperature source Oral, resp. rate 18, height 5\' 8"  (1.727 m), weight 79 kg, last menstrual period 07/12/2011, SpO2 100 %.    Neurological Examination   Mental  Status: Alert, oriented, thought content appropriate.  Speech fluent without evidence of aphasia.  Able to follow 3 step commands without difficulty. Cranial Nerves: II: Discs flat bilaterally; Visual fields grossly normal, pupils equal, round, reactive to light and accommodation III,IV, VI: ptosis not present, extra-ocular motions intact bilaterally V,VII: smile symmetric, facial light touch sensation normal bilaterally VIII: hearing normal bilaterally IX,X: gag reflex present XI: bilateral shoulder shrug XII: midline tongue extension Motor: Right : Upper extremity   5/5    Left:     Upper extremity   5/5  Lower extremity   5/5     Lower extremity   5/5 Tone and bulk:normal tone throughout; no atrophy noted Sensory: Pinprick and light touch intact throughout, bilaterally Deep Tendon Reflexes: 2+ and symmetric throughout Plantars: Right: downgoing   Left: downgoing Cerebellar: normal finger-to-nose, normal rapid alternating movements and normal heel-to-shin test Gait: normal gait and station      Laboratory Studies:   Basic Metabolic Panel: Recent Labs  Lab 12/08/18 0325  NA 138  K 3.4*  CL 105  CO2 22  GLUCOSE 94  BUN 11  CREATININE 0.58  CALCIUM 8.5*    Liver Function Tests: No results for input(s): AST, ALT, ALKPHOS, BILITOT, PROT, ALBUMIN in the last 168 hours. No results for input(s): LIPASE, AMYLASE in the last 168 hours. No results for input(s): AMMONIA in the last 168 hours.  CBC: Recent Labs  Lab 12/08/18 0325  WBC 7.7  HGB 12.3  HCT 36.7  MCV 94.3  PLT 249    Cardiac Enzymes: Recent Labs  Lab 12/08/18 0325 12/08/18 0857  TROPONINI <0.03 <0.03    BNP: Invalid input(s): POCBNP  CBG: No results for input(s): GLUCAP in the last 168 hours.  Microbiology: Results for orders placed or performed during the hospital encounter of 12/08/18  SARS Coronavirus 2 (CEPHEID - Performed in [place] hospital lab), Hosp Order     Status: None    Collection Time: 12/08/18  4:26 AM  Result Value Ref Range Status   SARS Coronavirus 2 NEGATIVE NEGATIVE Final    Comment: (NOTE) If result is NEGATIVE SARS-CoV-2 target nucleic acids are NOT DETECTED. The SARS-CoV-2 RNA is generally detectable in upper and lower  respiratory specimens during the acute phase of infection. The lowest  concentration of SARS-CoV-2 viral copies this assay can detect is 250  copies / mL. A negative result does not preclude SARS-CoV-2 infection  and should not be used as the sole basis for treatment or other  patient management decisions.  A negative result may occur with  improper specimen collection / handling, submission of specimen other  than nasopharyngeal swab, presence of viral mutation(s) within the  areas targeted by this assay, and inadequate number of viral copies  (<250 copies / mL). A negative result must be combined with clinical  observations, patient history, and epidemiological information. If result is POSITIVE SARS-CoV-2 target nucleic acids are DETECTED. The SARS-CoV-2 RNA is generally detectable in upper and lower  respiratory specimens dur ing the acute phase of infection.  Positive  results are indicative of active  infection with SARS-CoV-2.  Clinical  correlation with patient history and other diagnostic information is  necessary to determine patient infection status.  Positive results do  not rule out bacterial infection or co-infection with other viruses. If result is PRESUMPTIVE POSTIVE SARS-CoV-2 nucleic acids MAY BE PRESENT.   A presumptive positive result was obtained on the submitted specimen  and confirmed on repeat testing.  While 2019 novel coronavirus  (SARS-CoV-2) nucleic acids may be present in the submitted sample  additional confirmatory testing may be necessary for epidemiological  and / or clinical management purposes  to differentiate between  SARS-CoV-2 and other Sarbecovirus currently known to infect humans.   If clinically indicated additional testing with an alternate test  methodology [phone]) is advised. The SARS-CoV-2 RNA is generally  detectable in upper and lower respiratory sp ecimens during the acute  phase of infection. The expected result is Negative. Fact Sheet for Patients:  StrictlyIdeas.no Fact Sheet for Healthcare Providers: BankingDealers.co.za This test is not yet approved or cleared by the [place] FDA and has been authorized for detection and/or diagnosis of SARS-CoV-2 by FDA under an Emergency Use Authorization (EUA).  This EUA will remain in effect (meaning this test can be used) for the duration of the COVID-19 declaration under Section 564(b)(1) of the Act, 21 U.S.C. section 360bbb-3(b)(1), unless the authorization is terminated or revoked sooner. Performed at [facility], [address] [place], [place] [address]   MRSA PCR Screening     Status: None   Collection Time: 12/08/18 10:24 AM  Result Value Ref Range Status   MRSA by PCR NEGATIVE NEGATIVE Final    Comment:        The GeneXpert MRSA Assay (FDA approved for NASAL specimens only), is one component of a comprehensive MRSA colonization surveillance program. It is not intended to diagnose MRSA infection nor to guide or monitor treatment for MRSA infections. Performed at [facility], [address] [place], [place] [address]     Coagulation Studies: No results for input(s): LABPROT, INR in the last 72 hours.  Urinalysis: No results for input(s): COLORURINE, LABSPEC, PHURINE, GLUCOSEU, HGBUR, BILIRUBINUR, KETONESUR, PROTEINUR, UROBILINOGEN, NITRITE, LEUKOCYTESUR in the last 168 hours.  Invalid input(s): APPERANCEUR  Lipid Panel:     Component Value Date/Time   CHOL 194 01/05/2018 1457   TRIG 202.0 (H) 01/05/2018 1457   HDL 84.20 01/05/2018 1457   CHOLHDL 2 01/05/2018 1457   VLDL 40.4 (H) 01/05/2018 1457   LDLCALC 86  01/03/2017 1157    HgbA1C:  Lab Results  Component Value Date   HGBA1C 6.1 09/02/2015    Urine Drug Screen:  No results found for: LABOPIA, COCAINSCRNUR, LABBENZ, AMPHETMU, THCU, LABBARB  Alcohol Level: No results for input(s): ETH in the last 168 hours.  Other results: EKG: normal EKG, normal sinus rhythm, unchanged from previous tracings.  Imaging: Ct Head Wo Contrast  Result Date: 12/08/2018 CLINICAL DATA:  Headache EXAM: CT HEAD WITHOUT CONTRAST TECHNIQUE: Contiguous axial images were obtained from the base of the skull through the vertex without intravenous contrast. COMPARISON:  03/13/2015 FINDINGS: Brain: No acute territorial infarction, hemorrhage, or intracranial mass. The ventricles are nonenlarged. Vascular: No hyperdense vessel or unexpected calcification. Skull: Normal. Negative for fracture or focal lesion. Sinuses/Orbits: No acute finding. Other: None IMPRESSION: Negative non contrasted CT appearance of the brain Electronically Signed   By: Donavan Foil M.D.   On: 12/08/2018 03:35   Mr Brain Wo Contrast  Result Date: 12/08/2018 CLINICAL DATA:  Headache with history of migraines. Burning sensation in the head with right facial numbness. Difficulty swallowing. EXAM: MRI HEAD WITHOUT CONTRAST TECHNIQUE: Multiplanar, multiecho pulse sequences of the brain and surrounding structures were obtained without intravenous contrast. COMPARISON:  Head CT 12/08/2018 FINDINGS: Brain: There is susceptibility artifact from a right-sided earring which the patient was unable to remove. This obscures the lateral aspect of the right cerebellar hemisphere and a portion of the right temporal lobe, particularly on diffusion weighted and T2* gradient echo sequences limiting assessment for infarct and hemorrhage in these areas. No acute infarct, intracranial hemorrhage, mass, midline shift, or extra-axial fluid collection is identified. Small foci of T2 hyperintensity are noted in the predominantly  subcortical white matter of the frontal lobes, overall mild though abnormal for age. The periventricular white matter is largely spared. The ventricles and sulci are normal. Vascular: Major intracranial vascular flow voids are preserved. Skull and upper cervical spine: Unremarkable bone marrow signal. Sinuses/Orbits: Unremarkable orbits. Clear paranasal sinuses. Trace mastoid effusions. Other: None. IMPRESSION: 1. No acute intracranial abnormality. 2. Mild cerebral white matter T2 signal changes, nonspecific though may relate to the patient's history of migraines or reflect premature chronic small vessel ischemic disease. Electronically Signed   By: Logan Bores M.D.   On: 12/08/2018 09:35   US Carotid Bilateral (at [facility] Only)  Result Date: 12/08/2018 CLINICAL DATA:  TIA, hypertension EXAM: BILATERAL CAROTID DUPLEX ULTRASOUND TECHNIQUE: Pearline Cables scale imaging, color Doppler and duplex ultrasound were performed of bilateral carotid and vertebral arteries in the neck. COMPARISON:  None. FINDINGS: Criteria: Quantification of carotid stenosis is based on velocity parameters that correlate the residual internal carotid diameter with NASCET-based stenosis levels, using the diameter of the distal internal carotid lumen as the denominator for stenosis measurement. The following velocity measurements were obtained: RIGHT ICA: 134/55 cm/sec CCA: 76/28 cm/sec SYSTOLIC ICA/CCA RATIO:  1.7 ECA: 85 cm/sec LEFT ICA: 134/61 cm/sec CCA: 31/51 cm/sec SYSTOLIC ICA/CCA RATIO:  1.6 ECA: 84 cm/sec RIGHT CAROTID ARTERY: Minor intimal thickening without significant atherosclerosis. No hemodynamically significant right ICA stenosis, velocity elevation, or turbulent flow. Degree of narrowing less than 50%. RIGHT VERTEBRAL ARTERY:  Antegrade LEFT CAROTID ARTERY: Similar minor intimal thickening and early atherosclerosis. No hemodynamically significant left ICA stenosis, velocity elevation, or turbulent flow. LEFT VERTEBRAL ARTERY:   Antegrade IMPRESSION: Minor carotid intimal thickening and early atherosclerosis. No hemodynamically significant ICA stenosis. Degree of narrowing less than 50% bilaterally by ultrasound criteria. Patent antegrade vertebral flow bilaterally Electronically Signed   By: Jerilynn Mages.  Shick M.D.   On: 12/08/2018 10:17     Assessment/Plan:  43 y.o. female with hx of  migraine headaches, recurrent ear infection and anxiety presents emergency department complaining of right side facial numbness as well as a burning sensation all over her head.  Symptoms associated with pressure diffuse headache.  Usually takes Zomig and daily zonegram for the migraines which occur 2x per month.  Now much improved and close to baseline.    - Complicated migraine now improved - BAck to baseline - MRI brain no acute abnormality - d/c planning today - con't home medications - Magnesium (500-600mg  daily) and Vitamin B complex daily as out patient.  - follow up neurology as out pt  12/08/2018, 11:49 AM

## 2018-12-08 NOTE — Progress Notes (Signed)
*  PRELIMINARY RESULTS* Echocardiogram 2D Echocardiogram has been performed.  Linda [name] 12/08/2018, 11:00 AM

## 2018-12-11 ENCOUNTER — Telehealth: Payer: Self-pay

## 2018-12-11 NOTE — Telephone Encounter (Signed)
Attempted to reach patient to schedule hospital f/u appt. Primary phone invalid per automated message. Home phone rang multiple times with no answer.

## 2019-01-19 ENCOUNTER — Encounter: Payer: Self-pay | Admitting: Internal Medicine

## 2019-01-19 ENCOUNTER — Ambulatory Visit: Payer: Federal, State, Local not specified - PPO | Admitting: Internal Medicine

## 2019-01-19 DIAGNOSIS — Z0289 Encounter for other administrative examinations: Secondary | ICD-10-CM

## 2019-02-01 ENCOUNTER — Encounter (HOSPITAL_BASED_OUTPATIENT_CLINIC_OR_DEPARTMENT_OTHER): Payer: Self-pay | Admitting: Emergency Medicine

## 2019-02-01 ENCOUNTER — Emergency Department (HOSPITAL_BASED_OUTPATIENT_CLINIC_OR_DEPARTMENT_OTHER)
Admission: EM | Admit: 2019-02-01 | Discharge: 2019-02-01 | Disposition: A | Discharge status: Home / Self Care | Payer: Federal, State, Local not specified - PPO | Attending: Emergency Medicine | Admitting: Emergency Medicine

## 2019-02-01 ENCOUNTER — Other Ambulatory Visit: Payer: Self-pay

## 2019-02-01 DIAGNOSIS — Z79899 Other long term (current) drug therapy: Secondary | ICD-10-CM | POA: Diagnosis not present

## 2019-02-01 DIAGNOSIS — G43109 Migraine with aura, not intractable, without status migrainosus: Secondary | ICD-10-CM | POA: Diagnosis not present

## 2019-02-01 DIAGNOSIS — R51 Headache: Secondary | ICD-10-CM | POA: Diagnosis not present

## 2019-02-01 DIAGNOSIS — Z9884 Bariatric surgery status: Secondary | ICD-10-CM | POA: Diagnosis not present

## 2019-02-01 DIAGNOSIS — J45909 Unspecified asthma, uncomplicated: Secondary | ICD-10-CM | POA: Diagnosis not present

## 2019-02-01 DIAGNOSIS — G43809 Other migraine, not intractable, without status migrainosus: Secondary | ICD-10-CM | POA: Insufficient documentation

## 2019-02-01 MED ORDER — KETOROLAC TROMETHAMINE 15 MG/ML IJ SOLN
15.0000 mg | Freq: Once | INTRAMUSCULAR | Status: AC
  Administered 2019-02-01: 15 mg via INTRAVENOUS
  Filled 2019-02-01: qty 1

## 2019-02-01 MED ORDER — SODIUM CHLORIDE 0.9 % IV BOLUS
1000.0000 mL | Freq: Once | INTRAVENOUS | Status: AC
  Administered 2019-02-01: 1000 mL via INTRAVENOUS

## 2019-02-01 MED ORDER — METOCLOPRAMIDE HCL 5 MG/ML IJ SOLN
INTRAMUSCULAR | Status: AC
  Administered 2019-02-01: 10 mg via INTRAVENOUS
  Filled 2019-02-01: qty 2

## 2019-02-01 MED ORDER — DEXAMETHASONE SODIUM PHOSPHATE 10 MG/ML IJ SOLN
10.0000 mg | Freq: Once | INTRAMUSCULAR | Status: AC
  Administered 2019-02-01: 10 mg via INTRAVENOUS
  Filled 2019-02-01: qty 1

## 2019-02-01 MED ORDER — DIPHENHYDRAMINE HCL 50 MG/ML IJ SOLN
25.0000 mg | Freq: Once | INTRAMUSCULAR | Status: AC
  Administered 2019-02-01: 25 mg via INTRAVENOUS
  Filled 2019-02-01: qty 1

## 2019-02-01 MED ORDER — PROCHLORPERAZINE EDISYLATE 10 MG/2ML IJ SOLN: 10.0000 mg | Freq: Once | INTRAMUSCULAR | Status: DC

## 2019-02-01 MED ORDER — METOCLOPRAMIDE HCL 5 MG/ML IJ SOLN
10.0000 mg | Freq: Once | INTRAMUSCULAR | Status: AC
  Administered 2019-02-01: 10 mg via INTRAVENOUS

## 2019-02-01 NOTE — ED Triage Notes (Signed)
Pt c/o migraine since yesterday.

## 2019-02-01 NOTE — ED Provider Notes (Signed)
[facility] Provider Note: [name], MD, FACEP  CSN: [id number] MRN: [id number] ARRIVAL: 02/01/19 at [place]: [place]  Migraine   HISTORY OF PRESENT ILLNESS  02/01/19 1:52 AM Linda [name] is a 43 y.o. female with a history of complex migraines.  She is here with a headache that began the evening before last.  The pain is located in her right temple and radiates to the right side of her head and neck.  She rates her pain as an 8 out of 10 and describes it as a burning pain.  She has taken Excedrin and Zomig without adequate relief.  She has associated photophobia and nausea.  She has right facial paresthesias but no other focal deficits.  Right facial paresthesias is not an uncommon symptom with her migraines.   Past Medical History:  Diagnosis Date  . Anxiety   . Asthma   . GERD (gastroesophageal reflux disease)   . Heart murmur    while pregnant  . Migraine     Past Surgical History:  Procedure Laterality Date  . ABDOMINAL HYSTERECTOMY  12/20012   Total  . ENDOMETRIAL ABLATION  2012  . GASTRIC ROUX-EN-Y N/A 05/18/2016   Procedure: LAPAROSCOPIC ROUX-EN-Y GASTRIC WITH UPPER ENDO;  Surgeon: Arta Bruce Kinsinger, MD;  Location: [facility];  Service: General;  Laterality: N/A;  . MYRINGOTOMY WITH TUBE PLACEMENT     X 3 SINCE ORRIGINALLY PLACE IN 2010  . TONSILECTOMY, ADENOIDECTOMY, BILATERAL MYRINGOTOMY AND TUBES  2010  . TONSILLECTOMY    . TUBAL LIGATION  08/2010  . UPPER GI ENDOSCOPY  05/18/2016   Procedure: UPPER GI ENDOSCOPY;  Surgeon: Arta Bruce Kinsinger, MD;  Location: [facility];  Service: General;;    Family History  Problem Relation Age of Onset  . Hypertension Father   . Diabetes Father   . Hypertension Mother   . Cancer Mother        uterine  . Stroke Mother   . Diabetes Maternal Aunt   . Diabetes Maternal Uncle   . Cancer Maternal Grandmother        PANCREATIC  . Hypertension Maternal Grandmother   . Diabetes Maternal  Grandmother   . Cancer Maternal Grandfather        LUNG  . Hypertension Maternal Grandfather   . Diabetes Maternal Grandfather   . Cancer Paternal Grandmother        THROAT  . Alzheimer's disease Paternal Grandmother     Social History   Tobacco Use  . Smoking status: Never Smoker  . Smokeless tobacco: Never Used  Substance Use Topics  . Alcohol use: Yes    Alcohol/week: 1.0 standard drinks    Types: 1 Standard drinks or equivalent per week    Comment: occasional (not much)  . Drug use: No    Prior to Admission medications   Medication Sig Start Date End Date Taking? Authorizing Provider  acetaminophen (TYLENOL) 500 MG tablet Take 500 mg by mouth every 6 (six) hours as needed for fever.    [provider]  aspirin-acetaminophen-caffeine (EXCEDRIN MIGRAINE) [id number] MG tablet Take 2 tablets by mouth every 6 (six) hours as needed for headache.    [provider]  b complex vitamins tablet Take 1 tablet by mouth daily. 12/08/18   Fritzi Mandes, MD  magnesium oxide (MAG-OX) 400 (241.3 Mg) MG tablet Take 1 tablet (400 mg total) by mouth daily. 12/09/18   Fritzi Mandes, MD  zolmitriptan (ZOMIG) 5 MG  tablet TAKE ONE TABLET BY MOUTH AT ON SET OF HEADACHE. CAN TAKE ONE MORE TABLET 2 HOURS LATER IF NEEDED. Patient taking differently: Take 5 mg by mouth daily as needed for migraine. TAKE ONE TABLET BY MOUTH AT ON SET OF HEADACHE. CAN TAKE ONE MORE TABLET 2 HOURS LATER IF NEEDED. 09/15/17   Pieter Partridge, DO  zonisamide (ZONEGRAN) 100 MG capsule Take 2 capsules (200 mg total) by mouth daily. 07/06/17   Pieter Partridge, DO    Allergies Bacitra-neomycin-polymyxin-hc   REVIEW OF SYSTEMS  Negative except as noted here or in the History of Present Illness.   PHYSICAL EXAMINATION  Initial Vital Signs Blood pressure (!) 161/109, pulse 62, temperature 98.3 F (36.8 C), temperature source Oral, resp. rate 16, height 5\' 8"  (1.727 m), weight 79.4 kg, last menstrual period 07/12/2011,  SpO2 100 %.  Examination General: Well-developed, well-nourished female in no acute distress; appearance consistent with age of record HENT: normocephalic; atraumatic Eyes: pupils equal, round and reactive to light; extraocular muscles intact Neck: supple Heart: regular rate and rhythm Lungs: clear to auscultation bilaterally Abdomen: soft; nondistended; nontender; bowel sounds present Extremities: No deformity; full range of motion; pulses normal Neurologic: Awake, alert and oriented; motor function intact in all extremities and symmetric; no facial droop; normal coordination, speech and gait Skin: Warm and dry Psychiatric: Normal mood and affect   RESULTS  Summary of this visit's results, reviewed by myself:   EKG Interpretation  Date/Time:    Ventricular Rate:    PR Interval:    QRS Duration:   QT Interval:    QTC Calculation:   R Axis:     Text Interpretation:        Laboratory Studies: No results found for this or any previous visit (from the past 24 hour(s)). Imaging Studies: No results found.  ED COURSE and MDM  Nursing notes and initial vitals signs, including pulse oximetry, reviewed.  Vitals:   02/01/19 0148 02/01/19 0151  BP:  (!) 161/109  Pulse:  62  Resp:  16  Temp:  98.3 F (36.8 C)  TempSrc:  Oral  SpO2:  100%  Weight: 79.4 kg   Height: 5\' 8"  (1.727 m)    3:01 AM Patient feeling better after IV fluids and medications.  She states she is ready to go home.  PROCEDURES    ED DIAGNOSES     ICD-10-CM   1. Complicated migraine  W38.937        Senya Hinzman, Jenny Reichmann, MD 02/01/19 [phone]

## 2019-02-06 ENCOUNTER — Telehealth: Payer: Federal, State, Local not specified - PPO | Admitting: Family

## 2019-02-06 DIAGNOSIS — Z20822 Contact with and (suspected) exposure to covid-19: Secondary | ICD-10-CM

## 2019-02-06 NOTE — Progress Notes (Signed)
E-Visit for Corona Virus Screening   Your current symptoms could be consistent with the coronavirus.  Many health care providers can now test patients at their office but not all are.  [facility] has multiple testing sites. For information on our COVID testing locations and hours go to [email or web address]  Please quarantine yourself while awaiting your test results.  We are enrolling you in our [facility] for [place] . Daily you will receive a questionnaire within the [facility] website. Our COVID 19 response team willl be monitoriing your responses daily.    COVID-19 is a respiratory illness with symptoms that are similar to the flu. Symptoms are typically mild to moderate, but there have been cases of severe illness and death due to the virus. The following symptoms may appear 2-14 days after exposure: . Fever . Cough . Shortness of breath or difficulty breathing . Chills . Repeated shaking with chills . Muscle pain . Headache . Sore throat . New loss of taste or smell . Fatigue . Congestion or runny nose . Nausea or vomiting . Diarrhea  It is vitally important that if you feel that you have an infection such as this virus or any other virus that you stay home and away from places where you may spread it to others.  You should self-quarantine for 14 days if you have symptoms that could potentially be coronavirus or have been in close contact a with a person diagnosed with COVID-19 within the last 2 weeks. You should avoid contact with people age 74 and older.   You should wear a mask or cloth face covering over your nose and mouth if you must be around other people or animals, including pets (even at home). Try to stay at least 6 feet away from other people. This will protect the people around you.  You may also take acetaminophen (Tylenol) as needed for fever.   Reduce your risk of any infection by using the same precautions used for avoiding the  common cold or flu:  Marland Kitchen Wash your hands often with soap and warm water for at least 20 seconds.  If soap and water are not readily available, use an alcohol-based hand sanitizer with at least 60% alcohol.  . If coughing or sneezing, cover your mouth and nose by coughing or sneezing into the elbow areas of your shirt or coat, into a tissue or into your sleeve (not your hands). . Avoid shaking hands with others and consider head nods or verbal greetings only. . Avoid touching your eyes, nose, or mouth with unwashed hands.  . Avoid close contact with people who are sick. . Avoid places or events with large numbers of people in one location, like concerts or sporting events. . Carefully consider travel plans you have or are making. . If you are planning any travel outside or inside the [place], visit the CDC's Travelers' Health webpage for the latest health notices. . If you have some symptoms but not all symptoms, continue to monitor at home and seek medical attention if your symptoms worsen. . If you are having a medical emergency, call 911.  HOME CARE . Only take medications as instructed by your medical team. . Drink plenty of fluids and get plenty of rest. . A steam or ultrasonic humidifier can help if you have congestion.   GET HELP RIGHT AWAY IF YOU HAVE EMERGENCY WARNING SIGNS** FOR COVID-19. If you or someone is showing any of these signs seek emergency medical care immediately. Call  911 or proceed to your closest emergency facility if: . You develop worsening high fever. . Trouble breathing . Bluish lips or face . Persistent pain or pressure in the chest . New confusion . Inability to wake or stay awake . You cough up blood. . Your symptoms become more severe  **This list is not all possible symptoms. Contact your medical provider for any symptoms that are sever or concerning to you.   MAKE SURE YOU   Understand these instructions.  Will watch your condition.  Will get help right  away if you are not doing well or get worse.  Your e-visit answers were reviewed by a board certified advanced clinical practitioner to complete your personal care plan.  Depending on the condition, your plan could have included both over the counter or prescription medications.  If there is a problem please reply once you have received a response from your provider.  Your safety is important to [place].  If you have drug allergies check your prescription carefully.    You can use MyChart to ask questions about today's visit, request a non-urgent call back, or ask for a work or school excuse for 24 hours related to this e-Visit. If it has been greater than 24 hours you will need to follow up with your provider, or enter a new e-Visit to address those concerns. You will get an e-mail in the next two days asking about your experience.  I hope that your e-visit has been valuable and will speed your recovery. Thank you for using e-visits.   Greater than 5 minutes, yet less than 10 minutes of time have been spent researching, coordinating, and implementing care for this patient today.  Thank you for the details you included in the comment boxes. Those details are very helpful in determining the best course of treatment for you and help [place] to provide the best care.

## 2019-02-07 ENCOUNTER — Emergency Department (HOSPITAL_BASED_OUTPATIENT_CLINIC_OR_DEPARTMENT_OTHER)
Admission: EM | Admit: 2019-02-07 | Discharge: 2019-02-07 | Disposition: A | Discharge status: Home / Self Care | Payer: Federal, State, Local not specified - PPO | Attending: Emergency Medicine | Admitting: Emergency Medicine

## 2019-02-07 ENCOUNTER — Encounter (HOSPITAL_BASED_OUTPATIENT_CLINIC_OR_DEPARTMENT_OTHER): Payer: Self-pay

## 2019-02-07 ENCOUNTER — Other Ambulatory Visit: Payer: Self-pay

## 2019-02-07 DIAGNOSIS — G43909 Migraine, unspecified, not intractable, without status migrainosus: Secondary | ICD-10-CM | POA: Insufficient documentation

## 2019-02-07 DIAGNOSIS — Z20828 Contact with and (suspected) exposure to other viral communicable diseases: Secondary | ICD-10-CM | POA: Diagnosis not present

## 2019-02-07 DIAGNOSIS — Z79899 Other long term (current) drug therapy: Secondary | ICD-10-CM | POA: Insufficient documentation

## 2019-02-07 DIAGNOSIS — J45909 Unspecified asthma, uncomplicated: Secondary | ICD-10-CM | POA: Diagnosis not present

## 2019-02-07 DIAGNOSIS — R51 Headache: Secondary | ICD-10-CM | POA: Diagnosis not present

## 2019-02-07 MED ORDER — DEXAMETHASONE SODIUM PHOSPHATE 10 MG/ML IJ SOLN
10.0000 mg | Freq: Once | INTRAMUSCULAR | Status: AC
  Administered 2019-02-07: 16:00:00 10 mg via INTRAVENOUS
  Filled 2019-02-07: qty 1

## 2019-02-07 MED ORDER — PROMETHAZINE HCL 25 MG/ML IJ SOLN
25.0000 mg | Freq: Once | INTRAMUSCULAR | Status: AC
  Administered 2019-02-07: 25 mg via INTRAVENOUS
  Filled 2019-02-07: qty 1

## 2019-02-07 MED ORDER — DIPHENHYDRAMINE HCL 50 MG/ML IJ SOLN
12.5000 mg | Freq: Once | INTRAMUSCULAR | Status: AC
  Administered 2019-02-07: 12.5 mg via INTRAVENOUS
  Filled 2019-02-07: qty 1

## 2019-02-07 MED ORDER — KETOROLAC TROMETHAMINE 30 MG/ML IJ SOLN
15.0000 mg | Freq: Once | INTRAMUSCULAR | Status: AC
  Administered 2019-02-07: 15 mg via INTRAVENOUS
  Filled 2019-02-07: qty 1

## 2019-02-07 MED ORDER — SODIUM CHLORIDE 0.9 % IV BOLUS
500.0000 mL | Freq: Once | INTRAVENOUS | Status: AC
  Administered 2019-02-07: 500 mL via INTRAVENOUS

## 2019-02-07 NOTE — ED Notes (Signed)
Pt ambulatory to [facility]- pt feels much better and ready to be d/c

## 2019-02-07 NOTE — ED Provider Notes (Signed)
[facility] EMERGENCY DEPARTMENT Provider Note   CSN: [id number] Arrival date & time: 02/07/19  1520    History   Chief Complaint Chief Complaint  Patient presents with  . Headache    HPI Linda [name] is a 43 y.o. female.     HPI Patient presents with a severe headache.  It is dull in the front left side of her head.  Little bit going down the back of her neck.  States history of migraines.  States this is more severe than her typical migraine.  States is the worst headache she is ever had.  No fevers or chills.  No coughing.  Does have some photophobia.  No numbness or weakness.  States her hands feels that they contract out.  Took some Motrin this morning without relief.  Has had some nasal congestion.  Called her primary care doctor yesterday about the congestion.  No cough.  No abdominal pain. Past Medical History:  Diagnosis Date  . Anxiety   . Asthma   . GERD (gastroesophageal reflux disease)   . Heart murmur    while pregnant  . Migraine     Patient Active Problem List   Diagnosis Date Noted  . Complicated migraine 37/85/8850  . GERD (gastroesophageal reflux disease) 01/03/2017  . Seasonal allergies 01/03/2017  . Migraine without aura and without status migrainosus, not intractable 08/04/2015  . Anxiety 04/15/2014    Past Surgical History:  Procedure Laterality Date  . ABDOMINAL HYSTERECTOMY  12/20012   Total  . ENDOMETRIAL ABLATION  2012  . GASTRIC ROUX-EN-Y N/A 05/18/2016   Procedure: LAPAROSCOPIC ROUX-EN-Y GASTRIC WITH UPPER ENDO;  Surgeon: Arta Bruce Kinsinger, MD;  Location: [facility];  Service: General;  Laterality: N/A;  . MYRINGOTOMY WITH TUBE PLACEMENT     X 3 SINCE ORRIGINALLY PLACE IN 2010  . TONSILECTOMY, ADENOIDECTOMY, BILATERAL MYRINGOTOMY AND TUBES  2010  . TONSILLECTOMY    . TUBAL LIGATION  08/2010  . UPPER GI ENDOSCOPY  05/18/2016   Procedure: UPPER GI ENDOSCOPY;  Surgeon: Arta Bruce Kinsinger, MD;  Location: [facility];  Service:  General;;     OB History   No obstetric history on file.      Home Medications    Prior to Admission medications   Medication Sig Start Date End Date Taking? Authorizing Provider  acetaminophen (TYLENOL) 500 MG tablet Take 500 mg by mouth every 6 (six) hours as needed for fever.    [provider]  aspirin-acetaminophen-caffeine (EXCEDRIN MIGRAINE) [phone] MG tablet Take 2 tablets by mouth every 6 (six) hours as needed for headache.    [provider]  b complex vitamins tablet Take 1 tablet by mouth daily. 12/08/18   Fritzi Mandes, MD  magnesium oxide (MAG-OX) 400 (241.3 Mg) MG tablet Take 1 tablet (400 mg total) by mouth daily. 12/09/18   Fritzi Mandes, MD  zolmitriptan (ZOMIG) 5 MG tablet TAKE ONE TABLET BY MOUTH AT ON SET OF HEADACHE. CAN TAKE ONE MORE TABLET 2 HOURS LATER IF NEEDED. Patient taking differently: Take 5 mg by mouth daily as needed for migraine. TAKE ONE TABLET BY MOUTH AT ON SET OF HEADACHE. CAN TAKE ONE MORE TABLET 2 HOURS LATER IF NEEDED. 09/15/17   Pieter Partridge, DO  zonisamide (ZONEGRAN) 100 MG capsule Take 2 capsules (200 mg total) by mouth daily. 07/06/17   Pieter Partridge, DO    Family History Family History  Problem Relation Age of Onset  . Hypertension Father   .  Diabetes Father   . Hypertension Mother   . Cancer Mother        uterine  . Stroke Mother   . Diabetes Maternal Aunt   . Diabetes Maternal Uncle   . Cancer Maternal Grandmother        PANCREATIC  . Hypertension Maternal Grandmother   . Diabetes Maternal Grandmother   . Cancer Maternal Grandfather        LUNG  . Hypertension Maternal Grandfather   . Diabetes Maternal Grandfather   . Cancer Paternal Grandmother        THROAT  . Alzheimer's disease Paternal Grandmother     Social History Social History   Tobacco Use  . Smoking status: Never Smoker  . Smokeless tobacco: Never Used  Substance Use Topics  . Alcohol use: Yes    Comment: occ  . Drug use: No      Allergies   Bacitra-neomycin-polymyxin-hc   Review of Systems Review of Systems  Constitutional: Negative for appetite change.  HENT: Positive for congestion.   Eyes: Positive for photophobia.  Respiratory: Negative for cough and shortness of breath.   Cardiovascular: Negative for chest pain.  Gastrointestinal: Negative for abdominal distention.  Genitourinary: Negative for dysuria.  Musculoskeletal: Positive for neck pain.  Skin: Negative for rash.  Neurological: Positive for headaches. Negative for weakness.  Hematological: Negative for adenopathy.  Psychiatric/Behavioral: Negative for confusion.     Physical Exam Updated Vital Signs BP 138/81 (BP Location: Left Arm)   Pulse 80   Temp 98.7 F (37.1 C) (Oral)   Resp 16   Ht 5\' 8"  (1.727 m)   Wt 77.6 kg   LMP 07/12/2011   SpO2 100%   BMI 26.00 kg/m   Physical Exam Vitals signs and nursing note reviewed.  Constitutional:      Comments: Patient has bright red hair.  HENT:     Head: Atraumatic.  Eyes:     Pupils: Pupils are equal, round, and reactive to light.  Neck:     Musculoskeletal: No neck rigidity.  Cardiovascular:     Rate and Rhythm: Normal rate and regular rhythm.  Pulmonary:     Breath sounds: Normal breath sounds.  Abdominal:     Palpations: Abdomen is soft.  Skin:    General: Skin is warm.  Neurological:     Mental Status: She is alert.  Psychiatric:        Mood and Affect: Mood normal.      ED Treatments / Results  Labs (all labs ordered are listed, but only abnormal results are displayed) Labs Reviewed  NOVEL CORONAVIRUS, NAA (HOSPITAL ORDER, SEND-OUT TO REF LAB)    EKG None  Radiology No results found.  Procedures Procedures (including critical care time)  Medications Ordered in ED Medications  promethazine (PHENERGAN) injection 25 mg (25 mg Intravenous Given 02/07/19 1606)  ketorolac (TORADOL) 30 MG/ML injection 15 mg (15 mg Intravenous Given 02/07/19 1607)  sodium chloride  0.9 % bolus 500 mL (0 mLs Intravenous Stopped 02/07/19 1747)  diphenhydrAMINE (BENADRYL) injection 12.5 mg (12.5 mg Intravenous Given 02/07/19 1607)  dexamethasone (DECADRON) injection 10 mg (10 mg Intravenous Given 02/07/19 1607)     Initial Impression / Assessment and Plan / ED Course  I have reviewed the triage vital signs and the nursing notes.  Pertinent labs & imaging results that were available during my care of the patient were reviewed by me and considered in my medical decision making (see chart for details).  Patient with headache.  History of migraines but states this is different.  Feels much better after treatment.  However has had some URI symptoms recently.  Will do outpatient COVID test.  Discharge home.  Final Clinical Impressions(s) / ED Diagnoses   Final diagnoses:  Migraine without status migrainosus, not intractable, unspecified migraine type    ED Discharge Orders    None       Davonna Belling, MD 02/07/19 [phone]

## 2019-02-07 NOTE — ED Triage Notes (Signed)
Pt states she woke this am with HA-hx of migraine-states does not feel like migraine-pt anxious/hyperventilating and shaking

## 2019-02-07 NOTE — Discharge Instructions (Signed)
You have a COVID test that was done.  Results should be back in 1 to 2 days.

## 2019-02-09 LAB — NOVEL CORONAVIRUS, NAA (HOSP ORDER, SEND-OUT TO REF LAB; TAT 18-24 HRS): SARS-CoV-2, NAA: NOT DETECTED

## 2019-03-11 ENCOUNTER — Other Ambulatory Visit: Payer: Self-pay

## 2019-03-11 ENCOUNTER — Encounter (HOSPITAL_BASED_OUTPATIENT_CLINIC_OR_DEPARTMENT_OTHER): Payer: Self-pay

## 2019-03-11 ENCOUNTER — Emergency Department (HOSPITAL_BASED_OUTPATIENT_CLINIC_OR_DEPARTMENT_OTHER)
Admission: EM | Admit: 2019-03-11 | Discharge: 2019-03-12 | Disposition: A | Discharge status: Home / Self Care | Payer: Federal, State, Local not specified - PPO | Attending: Emergency Medicine | Admitting: Emergency Medicine

## 2019-03-11 DIAGNOSIS — Z7982 Long term (current) use of aspirin: Secondary | ICD-10-CM | POA: Diagnosis not present

## 2019-03-11 DIAGNOSIS — J45909 Unspecified asthma, uncomplicated: Secondary | ICD-10-CM | POA: Insufficient documentation

## 2019-03-11 DIAGNOSIS — R21 Rash and other nonspecific skin eruption: Secondary | ICD-10-CM | POA: Insufficient documentation

## 2019-03-11 DIAGNOSIS — L299 Pruritus, unspecified: Secondary | ICD-10-CM | POA: Diagnosis not present

## 2019-03-11 DIAGNOSIS — Z79899 Other long term (current) drug therapy: Secondary | ICD-10-CM | POA: Insufficient documentation

## 2019-03-11 DIAGNOSIS — L282 Other prurigo: Secondary | ICD-10-CM

## 2019-03-11 NOTE — ED Triage Notes (Signed)
Pt c/o macular erythremic rash on face, chest that is described as itching and burning. Pt has been taking benadryl throughout the day with some relief of the itching.

## 2019-03-12 MED ORDER — FAMOTIDINE 20 MG PO TABS
20.0000 mg | ORAL_TABLET | Freq: Once | ORAL | Status: AC
  Administered 2019-03-12: 20 mg via ORAL
  Filled 2019-03-12: qty 1

## 2019-03-12 MED ORDER — HYDROXYZINE HCL 25 MG PO TABS
25.0000 mg | ORAL_TABLET | Freq: Once | ORAL | Status: AC
  Administered 2019-03-12: 25 mg via ORAL
  Filled 2019-03-12: qty 1

## 2019-03-12 MED ORDER — FAMOTIDINE 20 MG PO TABS: 20.0000 mg | ORAL_TABLET | Freq: Two times a day (BID) | ORAL | 0 refills | Status: DC

## 2019-03-12 MED ORDER — HYDROXYZINE HCL 25 MG PO TABS: 25.0000 mg | ORAL_TABLET | Freq: Four times a day (QID) | ORAL | 0 refills | Status: DC

## 2019-03-12 MED ORDER — PREDNISONE 50 MG PO TABS
60.0000 mg | ORAL_TABLET | Freq: Once | ORAL | Status: AC
  Administered 2019-03-12: 60 mg via ORAL
  Filled 2019-03-12: qty 1

## 2019-03-12 MED ORDER — PREDNISONE 20 MG PO TABS: 40.0000 mg | ORAL_TABLET | Freq: Every day | ORAL | 0 refills | Status: DC

## 2019-03-12 NOTE — ED Provider Notes (Signed)
[facility] EMERGENCY DEPARTMENT Provider Note   CSN: [id number] Arrival date & time: 03/11/19  2301     History   Chief Complaint Chief Complaint  Patient presents with  . Rash    HPI Linda [name] is a 43 y.o. female.     HPI  This a 43 year old female who presents with a itchy rash.  Patient reports that she woke up yesterday morning and noted that her face was red and itchy.  She states that her eyes were swollen.  She denies any new exposures, detergents, soaps, lotions.  She took Benadryl with some improvement of her symptoms.  However, itchiness came back and began to extend into her chest.  She is not noted any significant rash but she has noted some redness to her cheeks and warmth.  She has never had anything like this before.  She denies shortness of breath or throat swelling.  Last took Benadryl at 10 PM.  Past Medical History:  Diagnosis Date  . Anxiety   . Asthma   . GERD (gastroesophageal reflux disease)   . Heart murmur    while pregnant  . Migraine     Patient Active Problem List   Diagnosis Date Noted  . Complicated migraine [id number]  . GERD (gastroesophageal reflux disease) 01/03/2017  . Seasonal allergies 01/03/2017  . Migraine without aura and without status migrainosus, not intractable 08/04/2015  . Anxiety 04/15/2014    Past Surgical History:  Procedure Laterality Date  . ABDOMINAL HYSTERECTOMY  12/20012   Total  . ENDOMETRIAL ABLATION  2012  . GASTRIC ROUX-EN-Y N/A 05/18/2016   Procedure: LAPAROSCOPIC ROUX-EN-Y GASTRIC WITH UPPER ENDO;  Surgeon: Arta Bruce Kinsinger, MD;  Location: [facility];  Service: General;  Laterality: N/A;  . MYRINGOTOMY WITH TUBE PLACEMENT     X 3 SINCE ORRIGINALLY PLACE IN 2010  . TONSILECTOMY, ADENOIDECTOMY, BILATERAL MYRINGOTOMY AND TUBES  2010  . TONSILLECTOMY    . TUBAL LIGATION  08/2010  . UPPER GI ENDOSCOPY  05/18/2016   Procedure: UPPER GI ENDOSCOPY;  Surgeon: Arta Bruce Kinsinger, MD;  Location:  [facility];  Service: General;;     OB History   No obstetric history on file.      Home Medications    Prior to Admission medications   Medication Sig Start Date End Date Taking? Authorizing Provider  acetaminophen (TYLENOL) 500 MG tablet Take 500 mg by mouth every 6 (six) hours as needed for fever.    [provider]  aspirin-acetaminophen-caffeine (EXCEDRIN MIGRAINE) [phone] MG tablet Take 2 tablets by mouth every 6 (six) hours as needed for headache.    [provider]  b complex vitamins tablet Take 1 tablet by mouth daily. 12/08/18   Fritzi Mandes, MD  famotidine (PEPCID) 20 MG tablet Take 1 tablet (20 mg total) by mouth 2 (two) times daily. 03/12/19   Karisa Nesser, Barbette Hair, MD  hydrOXYzine (ATARAX/VISTARIL) 25 MG tablet Take 1 tablet (25 mg total) by mouth every 6 (six) hours. 03/12/19   Iracema Lanagan, Barbette Hair, MD  magnesium oxide (MAG-OX) 400 (241.3 Mg) MG tablet Take 1 tablet (400 mg total) by mouth daily. 12/09/18   Fritzi Mandes, MD  predniSONE (DELTASONE) 20 MG tablet Take 2 tablets (40 mg total) by mouth daily. 03/12/19   Bliss Tsang, Barbette Hair, MD  zolmitriptan (ZOMIG) 5 MG tablet TAKE ONE TABLET BY MOUTH AT ON SET OF HEADACHE. CAN TAKE ONE MORE TABLET 2 HOURS LATER IF NEEDED. Patient taking differently: Take 5 mg  by mouth daily as needed for migraine. TAKE ONE TABLET BY MOUTH AT ON SET OF HEADACHE. CAN TAKE ONE MORE TABLET 2 HOURS LATER IF NEEDED. 09/15/17   Pieter Partridge, DO  zonisamide (ZONEGRAN) 100 MG capsule Take 2 capsules (200 mg total) by mouth daily. 07/06/17   Pieter Partridge, DO    Family History Family History  Problem Relation Age of Onset  . Hypertension Father   . Diabetes Father   . Hypertension Mother   . Cancer Mother        uterine  . Stroke Mother   . Diabetes Maternal Aunt   . Diabetes Maternal Uncle   . Cancer Maternal Grandmother        PANCREATIC  . Hypertension Maternal Grandmother   . Diabetes Maternal Grandmother   . Cancer Maternal  Grandfather        LUNG  . Hypertension Maternal Grandfather   . Diabetes Maternal Grandfather   . Cancer Paternal Grandmother        THROAT  . Alzheimer's disease Paternal Grandmother     Social History Social History   Tobacco Use  . Smoking status: Never Smoker  . Smokeless tobacco: Never Used  Substance Use Topics  . Alcohol use: Yes    Comment: occ  . Drug use: No     Allergies   Bacitra-neomycin-polymyxin-hc   Review of Systems Review of Systems  Constitutional: Negative for fever.  HENT: Negative for trouble swallowing.   Respiratory: Negative for shortness of breath.   Skin: Positive for rash.       Itching  All other systems reviewed and are negative.    Physical Exam Updated Vital Signs BP (!) 144/90   Pulse (!) 59   Temp 98.8 F (37.1 C) (Oral)   Resp 18   Ht 1.727 m (5\' 8" )   Wt 79.4 kg   LMP 07/12/2011   SpO2 100%   BMI 26.61 kg/m   Physical Exam Vitals signs and nursing note reviewed.  Constitutional:      Appearance: She is well-developed.  HENT:     Head: Normocephalic and atraumatic.     Comments: Erythema bilateral cheeks Eyes:     Extraocular Movements: Extraocular movements intact.     Pupils: Pupils are equal, round, and reactive to light.     Comments: No significant swelling  Neck:     Musculoskeletal: Neck supple.  Cardiovascular:     Rate and Rhythm: Normal rate and regular rhythm.  Pulmonary:     Effort: Pulmonary effort is normal. No respiratory distress.     Breath sounds: No wheezing.  Abdominal:     Palpations: Abdomen is soft.     Tenderness: There is no abdominal tenderness.  Musculoskeletal:     Right lower leg: No edema.     Left lower leg: No edema.  Skin:    General: Skin is warm and dry.     Findings: No rash.  Neurological:     Mental Status: She is alert and oriented to person, place, and time.  Psychiatric:        Mood and Affect: Mood normal.      ED Treatments / Results  Labs (all labs  ordered are listed, but only abnormal results are displayed) Labs Reviewed - No data to display  EKG None  Radiology No results found.  Procedures Procedures (including critical care time)  Medications Ordered in ED Medications  hydrOXYzine (ATARAX/VISTARIL) tablet 25 mg (25 mg Oral  Given 03/12/19 0028)  famotidine (PEPCID) tablet 20 mg (20 mg Oral Given 03/12/19 0027)  predniSONE (DELTASONE) tablet 60 mg (60 mg Oral Given 03/12/19 0028)     Initial Impression / Assessment and Plan / ED Course  I have reviewed the triage vital signs and the nursing notes.  Pertinent labs & imaging results that were available during my care of the patient were reviewed by me and considered in my medical decision making (see chart for details).        Patient presents with itching mostly over the face and upper trunk.  She has some erythema of bilateral cheeks but no significant rash.  No hives.  No signs or symptoms of anaphylaxis.  Patient was given Atarax, Pepcid, and prednisone.  On recheck, she states she feels much better with improvement of symptoms.  Suspect histamine related symptoms.  She cannot identify a trigger.  Recommend prednisone, Atarax, Pepcid at home.  After history, exam, and medical workup I feel the patient has been appropriately medically screened and is safe for discharge home. Pertinent diagnoses were discussed with the patient. Patient was given return precautions.   Final Clinical Impressions(s) / ED Diagnoses   Final diagnoses:  Pruritic rash    ED Discharge Orders         Ordered    predniSONE (DELTASONE) 20 MG tablet  Daily     03/12/19 0144    famotidine (PEPCID) 20 MG tablet  2 times daily     03/12/19 0144    hydrOXYzine (ATARAX/VISTARIL) 25 MG tablet  Every 6 hours     03/12/19 0144           Nashea Chumney, Barbette Hair, MD 03/12/19 [id number]

## 2019-03-12 NOTE — Discharge Instructions (Addendum)
You were seen today for rash and itching.  This is likely an allergic rash in nature.  Take medications as prescribed.  If you develop shortness of breath, nausea, vomiting, throat swelling, you should be reevaluated.

## 2019-03-26 DIAGNOSIS — F4323 Adjustment disorder with mixed anxiety and depressed mood: Secondary | ICD-10-CM | POA: Diagnosis not present

## 2019-03-27 ENCOUNTER — Other Ambulatory Visit: Payer: Self-pay | Admitting: Internal Medicine

## 2019-03-28 ENCOUNTER — Encounter (HOSPITAL_COMMUNITY): Payer: Self-pay

## 2019-04-10 ENCOUNTER — Ambulatory Visit: Payer: Federal, State, Local not specified - PPO | Admitting: Internal Medicine

## 2019-04-13 NOTE — Telephone Encounter (Signed)
Noted and Loma Sousa advised.

## 2019-04-16 ENCOUNTER — Encounter: Payer: Self-pay | Admitting: Internal Medicine

## 2019-04-16 ENCOUNTER — Ambulatory Visit (INDEPENDENT_AMBULATORY_CARE_PROVIDER_SITE_OTHER): Payer: Federal, State, Local not specified - PPO | Admitting: Internal Medicine

## 2019-04-16 ENCOUNTER — Other Ambulatory Visit: Payer: Self-pay

## 2019-04-16 VITALS — BP 130/78 | HR 72 | Temp 98.1°F | Wt 180.0 lb

## 2019-04-16 DIAGNOSIS — F419 Anxiety disorder, unspecified: Secondary | ICD-10-CM

## 2019-04-16 DIAGNOSIS — F329 Major depressive disorder, single episode, unspecified: Secondary | ICD-10-CM

## 2019-04-16 DIAGNOSIS — F32A Depression, unspecified: Secondary | ICD-10-CM

## 2019-04-16 DIAGNOSIS — N644 Mastodynia: Secondary | ICD-10-CM | POA: Diagnosis not present

## 2019-04-16 MED ORDER — VENLAFAXINE HCL ER 37.5 MG PO CP24: 37.5000 mg | ORAL_CAPSULE | Freq: Every day | ORAL | 5 refills | Status: DC

## 2019-04-16 MED ORDER — ALPRAZOLAM 0.25 MG PO TABS: 0.2500 mg | ORAL_TABLET | Freq: Two times a day (BID) | ORAL | 0 refills | Status: DC | PRN

## 2019-04-16 NOTE — Progress Notes (Signed)
Subjective:    Patient ID: Linda [name], female    DOB: 01-20-1976, 43 y.o.   MRN: [id number]  HPI  Pt presents to the clinic today with c/o possible mass in her left breast. She noticed this 2 weeks ago. She reports she initially felt a hard lump, but when she went to recheck she could not find it. She does have some soreness in the left breast but no tenderness to touch. She denies changes in the skin of the breast, discharge from the nipple. Her last mammogram was 03/2011. She has no family history of breast cancer that she is aware of.  She also wants to follow up anxiety and panic attacks. Triggered by general life stress and anxiety. She is having anxiety symptoms daily. She is having panic attacks weekly x 1 month. She is not able to take Hydroxyzine because she felt like it is not effective. She has been on Buspar, Wellbutrin, Fluoxetine, Sertraline, Citalopram and Xanax in the past.  She has been having trouble falling asleep and staying asleep. She is currently seeing a therapist. Sleep study from 03/2016 reviewed. She has tried Ambien in the past for sleep.   Review of Systems      Past Medical History:  Diagnosis Date  . Anxiety   . Asthma   . GERD (gastroesophageal reflux disease)   . Heart murmur    while pregnant  . Migraine     Current Outpatient Medications  Medication Sig Dispense Refill  . acetaminophen (TYLENOL) 500 MG tablet Take 500 mg by mouth every 6 (six) hours as needed for fever.    Marland Kitchen aspirin-acetaminophen-caffeine (EXCEDRIN MIGRAINE) 250-250-65 MG tablet Take 2 tablets by mouth every 6 (six) hours as needed for headache.    . b complex vitamins tablet Take 1 tablet by mouth daily. 30 tablet 1  . famotidine (PEPCID) 20 MG tablet Take 1 tablet (20 mg total) by mouth 2 (two) times daily. 30 tablet 0  . hydrOXYzine (ATARAX/VISTARIL) 25 MG tablet Take 1 tablet (25 mg total) by mouth every 6 (six) hours. 12 tablet 0  . magnesium oxide (MAG-OX) 400 (241.3 Mg)  MG tablet Take 1 tablet (400 mg total) by mouth daily. 30 tablet 1  . predniSONE (DELTASONE) 20 MG tablet Take 2 tablets (40 mg total) by mouth daily. 8 tablet 0  . zolmitriptan (ZOMIG) 5 MG tablet TAKE ONE TABLET BY MOUTH AT ON SET OF HEADACHE. CAN TAKE ONE MORE TABLET 2 HOURS LATER IF NEEDED. (Patient taking differently: Take 5 mg by mouth daily as needed for migraine. TAKE ONE TABLET BY MOUTH AT ON SET OF HEADACHE. CAN TAKE ONE MORE TABLET 2 HOURS LATER IF NEEDED.) 10 tablet 5  . zonisamide (ZONEGRAN) 100 MG capsule Take 2 capsules (200 mg total) by mouth daily. 60 capsule 11   No current facility-administered medications for this visit.     Allergies  Allergen Reactions  . Bacitra-Neomycin-Polymyxin-Hc Other (See Comments)    Pain and swelling of ear canal    Family History  Problem Relation Age of Onset  . Hypertension Father   . Diabetes Father   . Hypertension Mother   . Cancer Mother        uterine  . Stroke Mother   . Diabetes Maternal Aunt   . Diabetes Maternal Uncle   . Cancer Maternal Grandmother        PANCREATIC  . Hypertension Maternal Grandmother   . Diabetes Maternal Grandmother   . Cancer  Maternal Grandfather        LUNG  . Hypertension Maternal Grandfather   . Diabetes Maternal Grandfather   . Cancer Paternal Grandmother        THROAT  . Alzheimer's disease Paternal Grandmother     Social History   Socioeconomic History  . Marital status: Divorced    Spouse name: Not on file  . Number of children: Not on file  . Years of education: Not on file  . Highest education level: Not on file  Occupational History  . Not on file  Social Needs  . Financial resource strain: Not on file  . Food insecurity    Worry: Not on file    Inability: Not on file  . Transportation needs    Medical: Not on file    Non-medical: Not on file  Tobacco Use  . Smoking status: Never Smoker  . Smokeless tobacco: Never Used  Substance and Sexual Activity  . Alcohol use:  Yes    Comment: occ  . Drug use: No  . Sexual activity: Not on file  Lifestyle  . Physical activity    Days per week: Not on file    Minutes per session: Not on file  . Stress: Not on file  Relationships  . Social Herbalist on phone: Not on file    Gets together: Not on file    Attends religious service: Not on file    Active member of club or organization: Not on file    Attends meetings of clubs or organizations: Not on file    Relationship status: Not on file  . Intimate partner violence    Fear of current or ex partner: Not on file    Emotionally abused: Not on file    Physically abused: Not on file    Forced sexual activity: Not on file  Other Topics Concern  . Not on file  Social History Narrative  . Not on file     Constitutional: Denies fever, malaise, fatigue, headache or abrupt weight changes.  Respiratory: Denies difficulty breathing, shortness of breath, cough or sputum production.   Cardiovascular: Denies chest pain, chest tightness, palpitations or swelling in the hands or feet.  Skin: Pt reports mass of breast. Denies redness, rashes, or ulcercations.  Neurological: Pt reports insomnia. Denies dizziness, difficulty with memory, difficulty with speech or problems with balance and coordination.  Psych: Pt reports anxiety and panic attacks, depression. Denies SI/HI.  No other specific complaints in a complete review of systems (except as listed in HPI above).  Objective:   Physical Exam   BP 130/78   Pulse 72   Temp 98.1 F (36.7 C) (Temporal)   Wt 180 lb (81.6 kg)   LMP 07/12/2011   SpO2 99%   BMI 27.37 kg/m  Wt Readings from Last 3 Encounters:  04/16/19 180 lb (81.6 kg)  03/11/19 175 lb (79.4 kg)  02/07/19 171 lb (77.6 kg)    General: Appears her stated age, well developed, well nourished in NAD. Skin: Warm, dry and intact. 0.5 cm round hyperpigmented lesion of left breast. Breasts: Symmetrical. Fibrocystic changes bilaterally. No  obvious mass but tenderness with palpation at 7-8 oclock, left breast. No skin changes, discharge from the nipple of axillary lymphadenopathy. Cardiovascular: Normal rate and rhythm. S1,S2 noted.  No murmur, rubs or gallops noted. Pulmonary/Chest: Normal effort and positive vesicular breath sounds. No respiratory distress. No wheezes, rales or ronchi noted.  Neurological: Alert and oriented.  Psychiatric: Tearful  BMET    Component Value Date/Time   NA 138 12/08/2018 0325   NA 137 05/31/2013 0846   K 3.4 (L) 12/08/2018 0325   K 3.8 05/31/2013 0846   CL 105 12/08/2018 0325   CL 106 05/31/2013 0846   CO2 22 12/08/2018 0325   CO2 26 05/31/2013 0846   GLUCOSE 94 12/08/2018 0325   GLUCOSE 93 05/31/2013 0846   BUN 11 12/08/2018 0325   BUN 10 05/31/2013 0846   CREATININE 0.58 12/08/2018 0325   CREATININE 0.76 05/31/2013 0846   CALCIUM 8.5 (L) 12/08/2018 0325   CALCIUM 8.2 (L) 05/31/2013 0846   GFRNONAA >60 12/08/2018 0325   GFRNONAA >60 05/31/2013 0846   GFRAA >60 12/08/2018 0325   GFRAA >60 05/31/2013 0846    Lipid Panel     Component Value Date/Time   CHOL 194 01/05/2018 1457   TRIG 202.0 (H) 01/05/2018 1457   HDL 84.20 01/05/2018 1457   CHOLHDL 2 01/05/2018 1457   VLDL 40.4 (H) 01/05/2018 1457   LDLCALC 86 01/03/2017 1157    CBC    Component Value Date/Time   WBC 7.7 12/08/2018 0325   RBC 3.89 12/08/2018 0325   HGB 12.3 12/08/2018 0325   HGB 13.3 05/31/2013 0846   HCT 36.7 12/08/2018 0325   HCT 39.2 05/31/2013 0846   PLT 249 12/08/2018 0325   PLT 219 05/31/2013 0846   MCV 94.3 12/08/2018 0325   MCV 88 05/31/2013 0846   MCH 31.6 12/08/2018 0325   MCHC 33.5 12/08/2018 0325   RDW 13.2 12/08/2018 0325   RDW 12.8 05/31/2013 0846   LYMPHSABS 0.5 (L) 10/21/2018 1634   LYMPHSABS 2.1 05/31/2013 0846   MONOABS 0.2 10/21/2018 1634   MONOABS 0.4 05/31/2013 0846   EOSABS 0.0 10/21/2018 1634   EOSABS 0.1 05/31/2013 0846   BASOSABS 0.0 10/21/2018 1634   BASOSABS 0.1  05/31/2013 0846    Hgb A1C Lab Results  Component Value Date   HGBA1C 6.1 09/02/2015           Assessment & Plan:   Left Breast Pain:  Will obtain diagnostic mammogram of bilateral breast Will obtain US of left breast  RTC in 1 month for CPE Webb Silversmith, NP

## 2019-04-16 NOTE — Patient Instructions (Signed)

## 2019-04-16 NOTE — Assessment & Plan Note (Signed)
Deteriorated due to stress from the pandemic Support offered today She is not well matched with her therapist, she will look for a new one. She declines referral today Will trial Effexor 37.5 mg PO QHS Will restart Xanax 0.25 mg daily prn- sedation caution given Will obtain CSA and UDS at her next visit

## 2019-04-18 ENCOUNTER — Encounter: Payer: Self-pay | Admitting: Internal Medicine

## 2019-04-18 ENCOUNTER — Ambulatory Visit
Admission: RE | Admit: 2019-04-18 | Discharge: 2019-04-18 | Disposition: A | Discharge status: Home / Self Care | Payer: Federal, State, Local not specified - PPO | Source: Ambulatory Visit | Attending: Internal Medicine | Admitting: Internal Medicine

## 2019-04-18 ENCOUNTER — Other Ambulatory Visit: Payer: Self-pay

## 2019-04-18 DIAGNOSIS — N644 Mastodynia: Secondary | ICD-10-CM

## 2019-04-18 DIAGNOSIS — R928 Other abnormal and inconclusive findings on diagnostic imaging of breast: Secondary | ICD-10-CM | POA: Diagnosis not present

## 2019-05-05 ENCOUNTER — Other Ambulatory Visit: Payer: Self-pay

## 2019-05-05 ENCOUNTER — Emergency Department (HOSPITAL_BASED_OUTPATIENT_CLINIC_OR_DEPARTMENT_OTHER): Payer: Federal, State, Local not specified - PPO

## 2019-05-05 ENCOUNTER — Emergency Department (HOSPITAL_BASED_OUTPATIENT_CLINIC_OR_DEPARTMENT_OTHER)
Admission: EM | Admit: 2019-05-05 | Discharge: 2019-05-05 | Disposition: A | Discharge status: Home / Self Care | Payer: Federal, State, Local not specified - PPO | Attending: Emergency Medicine | Admitting: Emergency Medicine

## 2019-05-05 ENCOUNTER — Encounter (HOSPITAL_BASED_OUTPATIENT_CLINIC_OR_DEPARTMENT_OTHER): Payer: Self-pay | Admitting: Emergency Medicine

## 2019-05-05 DIAGNOSIS — R070 Pain in throat: Secondary | ICD-10-CM | POA: Insufficient documentation

## 2019-05-05 DIAGNOSIS — J45909 Unspecified asthma, uncomplicated: Secondary | ICD-10-CM | POA: Insufficient documentation

## 2019-05-05 DIAGNOSIS — R519 Headache, unspecified: Secondary | ICD-10-CM | POA: Insufficient documentation

## 2019-05-05 DIAGNOSIS — Z20828 Contact with and (suspected) exposure to other viral communicable diseases: Secondary | ICD-10-CM | POA: Insufficient documentation

## 2019-05-05 DIAGNOSIS — J029 Acute pharyngitis, unspecified: Secondary | ICD-10-CM | POA: Diagnosis not present

## 2019-05-05 LAB — URINALYSIS, MICROSCOPIC (REFLEX)

## 2019-05-05 LAB — URINALYSIS, ROUTINE W REFLEX MICROSCOPIC
Bilirubin Urine: NEGATIVE
Glucose, UA: NEGATIVE mg/dL
Ketones, ur: NEGATIVE mg/dL
Leukocytes,Ua: NEGATIVE
Nitrite: NEGATIVE
Protein, ur: NEGATIVE mg/dL
Specific Gravity, Urine: 1.02 (ref 1.005–1.030)
pH: 6.5 (ref 5.0–8.0)

## 2019-05-05 MED ORDER — KETOROLAC TROMETHAMINE 30 MG/ML IJ SOLN
30.0000 mg | Freq: Once | INTRAMUSCULAR | Status: AC
  Administered 2019-05-05: 30 mg via INTRAVENOUS
  Filled 2019-05-05: qty 1

## 2019-05-05 MED ORDER — DEXAMETHASONE SODIUM PHOSPHATE 10 MG/ML IJ SOLN
10.0000 mg | Freq: Once | INTRAMUSCULAR | Status: AC
  Administered 2019-05-05: 10 mg via INTRAVENOUS
  Filled 2019-05-05: qty 1

## 2019-05-05 MED ORDER — DROPERIDOL 2.5 MG/ML IJ SOLN
2.5000 mg | Freq: Once | INTRAMUSCULAR | Status: AC
  Administered 2019-05-05: 2.5 mg via INTRAVENOUS

## 2019-05-05 MED ORDER — DIPHENHYDRAMINE HCL 50 MG/ML IJ SOLN
25.0000 mg | Freq: Once | INTRAMUSCULAR | Status: AC
  Administered 2019-05-05: 25 mg via INTRAVENOUS
  Filled 2019-05-05: qty 1

## 2019-05-05 MED ORDER — SODIUM CHLORIDE 0.9 % IV BOLUS
1000.0000 mL | Freq: Once | INTRAVENOUS | Status: AC
  Administered 2019-05-05: 1000 mL via INTRAVENOUS

## 2019-05-05 MED ORDER — DROPERIDOL 2.5 MG/ML IJ SOLN
INTRAMUSCULAR | Status: AC
  Filled 2019-05-05: qty 2

## 2019-05-05 MED ORDER — PROCHLORPERAZINE EDISYLATE 10 MG/2ML IJ SOLN
10.0000 mg | Freq: Once | INTRAMUSCULAR | Status: AC
  Administered 2019-05-05: 10 mg via INTRAVENOUS
  Filled 2019-05-05: qty 2

## 2019-05-05 NOTE — Discharge Instructions (Addendum)
Person Under Monitoring Name: Linda [name]  Location: [place] [address]   Infection Prevention Recommendations for Individuals Confirmed to have, or Being Evaluated for, 2019 Novel Coronavirus (COVID-19) Infection Who Receive Care at Home  Individuals who are confirmed to have, or are being evaluated for, COVID-19 should follow the prevention steps below until a healthcare provider or local or state health department says they can return to normal activities.  Stay home except to get medical care You should restrict activities outside your home, except for getting medical care. Do not go to work, school, or public areas, and do not use public transportation or taxis.  Call ahead before visiting your doctor Before your medical appointment, call the healthcare provider and tell them that you have, or are being evaluated for, COVID-19 infection. This will help the healthcare providers office take steps to keep other people from getting infected. Ask your healthcare provider to call the local or state health department.  Monitor your symptoms Seek prompt medical attention if your illness is worsening (e.g., difficulty breathing). Before going to your medical appointment, call the healthcare provider and tell them that you have, or are being evaluated for, COVID-19 infection. Ask your healthcare provider to call the local or state health department.  Wear a facemask You should wear a facemask that covers your nose and mouth when you are in the same room with other people and when you visit a healthcare provider. People who live with or visit you should also wear a facemask while they are in the same room with you.  Separate yourself from other people in your home As much as possible, you should stay in a different room from other people in your home. Also, you should use a separate bathroom, if available.  Avoid sharing household items You  should not share dishes, drinking glasses, cups, eating utensils, towels, bedding, or other items with other people in your home. After using these items, you should wash them thoroughly with soap and water.  Cover your coughs and sneezes Cover your mouth and nose with a tissue when you cough or sneeze, or you can cough or sneeze into your sleeve. Throw used tissues in a lined trash can, and immediately wash your hands with soap and water for at least 20 seconds or use an alcohol-based hand rub.  Wash your [facility] your hands often and thoroughly with soap and water for at least 20 seconds. You can use an alcohol-based hand sanitizer if soap and water are not available and if your hands are not visibly dirty. Avoid touching your eyes, nose, and mouth with unwashed hands.   Prevention Steps for Caregivers and Household Members of Individuals Confirmed to have, or Being Evaluated for, COVID-19 Infection Being Cared for in the Home  If you live with, or provide care at home for, a person confirmed to have, or being evaluated for, COVID-19 infection please follow these guidelines to prevent infection:  Follow healthcare providers instructions Make sure that you understand and can help the patient follow any healthcare provider instructions for all care.  Provide for the patients basic needs You should help the patient with basic needs in the home and provide support for getting groceries, prescriptions, and other personal needs.  Monitor the patients symptoms If they are getting sicker, call his or her medical provider and tell them that the patient has, or is being evaluated for, COVID-19 infection. This will help the  healthcare providers office take steps to keep other people from getting infected. Ask the healthcare provider to call the local or state health department.  Limit the number of people who have contact with the patient If possible, have only one caregiver for the  patient. Other household members should stay in another home or place of residence. If this is not possible, they should stay in another room, or be separated from the patient as much as possible. Use a separate bathroom, if available. Restrict visitors who do not have an essential need to be in the home.  Keep older adults, very young children, and other sick people away from the patient Keep older adults, very young children, and those who have compromised immune systems or chronic health conditions away from the patient. This includes people with chronic heart, lung, or kidney conditions, diabetes, and cancer.  Ensure good ventilation Make sure that shared spaces in the home have good air flow, such as from an air conditioner or an opened window, weather permitting.  Wash your hands often Wash your hands often and thoroughly with soap and water for at least 20 seconds. You can use an alcohol based hand sanitizer if soap and water are not available and if your hands are not visibly dirty. Avoid touching your eyes, nose, and mouth with unwashed hands. Use disposable paper towels to dry your hands. If not available, use dedicated cloth towels and replace them when they become wet.  Wear a facemask and gloves Wear a disposable facemask at all times in the room and gloves when you touch or have contact with the patients blood, body fluids, and/or secretions or excretions, such as sweat, saliva, sputum, nasal mucus, vomit, urine, or feces.  Ensure the mask fits over your nose and mouth tightly, and do not touch it during use. Throw out disposable facemasks and gloves after using them. Do not reuse. Wash your hands immediately after removing your facemask and gloves. If your personal clothing becomes contaminated, carefully remove clothing and launder. Wash your hands after handling contaminated clothing. Place all used disposable facemasks, gloves, and other waste in a lined container before  disposing them with other household waste. Remove gloves and wash your hands immediately after handling these items.  Do not share dishes, glasses, or other household items with the patient Avoid sharing household items. You should not share dishes, drinking glasses, cups, eating utensils, towels, bedding, or other items with a patient who is confirmed to have, or being evaluated for, COVID-19 infection. After the person uses these items, you should wash them thoroughly with soap and water.  Wash laundry thoroughly Immediately remove and wash clothes or bedding that have blood, body fluids, and/or secretions or excretions, such as sweat, saliva, sputum, nasal mucus, vomit, urine, or feces, on them. Wear gloves when handling laundry from the patient. Read and follow directions on labels of laundry or clothing items and detergent. In general, wash and dry with the warmest temperatures recommended on the label.  Clean all areas the individual has used often Clean all touchable surfaces, such as counters, tabletops, doorknobs, bathroom fixtures, toilets, phones, keyboards, tablets, and bedside tables, every day. Also, clean any surfaces that may have blood, body fluids, and/or secretions or excretions on them. Wear gloves when cleaning surfaces the patient has come in contact with. Use a diluted bleach solution (e.g., dilute bleach with 1 part bleach and 10 parts water) or a household disinfectant with a label that says EPA-registered for coronaviruses. To  make a bleach solution at home, add 1 tablespoon of bleach to 1 quart (4 cups) of water. For a larger supply, add  cup of bleach to 1 gallon (16 cups) of water. Read labels of cleaning products and follow recommendations provided on product labels. Labels contain instructions for safe and effective use of the cleaning product including precautions you should take when applying the product, such as wearing gloves or eye protection and making sure you  have good ventilation during use of the product. Remove gloves and wash hands immediately after cleaning.  Monitor yourself for signs and symptoms of illness Caregivers and household members are considered close contacts, should monitor their health, and will be asked to limit movement outside of the home to the extent possible. Follow the monitoring steps for close contacts listed on the symptom monitoring form.   ? If you have additional questions, contact your local health department or call the epidemiologist on call at [phone] (available 24/7). ? This guidance is subject to change. For the most up-to-date guidance from [facility], please refer to their website: [email or web address]

## 2019-05-05 NOTE — ED Provider Notes (Signed)
[facility] DEPARTMENT Provider Note   CSN: [id number] Arrival date & time: 05/05/19  [id number]     History   Chief Complaint Chief Complaint  Patient presents with  . Sore Throat    headache    HPI Linda [name] is a 43 y.o. female.     43yo F w/ PMH including asthma, migraines who p/w headache and sore throat. Last night she had gradual onset of headache that progressively worsened and became severe. She took Zomig without relief. Overnight she woke up and felt like she couldn't breathe so she called EMS. They gave her benadryl but she did not go to hospital. Now that feeling of not being about to breathe is gone but her throat hurts. She reports diarrhea, chills, and dry heaves. Denies cough, fever. She states her headache is the worst it's ever hurt. She has had nausea and photophobia. No sick contacts.    Sore Throat    Past Medical History:  Diagnosis Date  . Anxiety   . Asthma   . GERD (gastroesophageal reflux disease)   . Heart murmur    while pregnant  . Migraine     Patient Active Problem List   Diagnosis Date Noted  . Complicated migraine [id number]  . GERD (gastroesophageal reflux disease) 01/03/2017  . Seasonal allergies 01/03/2017  . Anxiety and depression 04/15/2014    Past Surgical History:  Procedure Laterality Date  . ABDOMINAL HYSTERECTOMY  12/20012   Total  . ENDOMETRIAL ABLATION  2012  . GASTRIC ROUX-EN-Y N/A 05/18/2016   Procedure: LAPAROSCOPIC ROUX-EN-Y GASTRIC WITH UPPER ENDO;  Surgeon: Arta Bruce Kinsinger, MD;  Location: [facility];  Service: General;  Laterality: N/A;  . MYRINGOTOMY WITH TUBE PLACEMENT     X 3 SINCE ORRIGINALLY PLACE IN 2010  . TONSILECTOMY, ADENOIDECTOMY, BILATERAL MYRINGOTOMY AND TUBES  2010  . TONSILLECTOMY    . TUBAL LIGATION  08/2010  . UPPER GI ENDOSCOPY  05/18/2016   Procedure: UPPER GI ENDOSCOPY;  Surgeon: Arta Bruce Kinsinger, MD;  Location: [facility];  Service: General;;     OB History   No  obstetric history on file.      Home Medications    Prior to Admission medications   Medication Sig Start Date End Date Taking? Authorizing Provider  acetaminophen (TYLENOL) 500 MG tablet Take 500 mg by mouth every 6 (six) hours as needed for fever.    [provider]  hydrOXYzine (ATARAX/VISTARIL) 10 MG tablet TAKE 1 TABLET (10 MG TOTAL) BY MOUTH 2 (TWO) TIMES DAILY AS NEEDED. 03/08/19   [provider]  promethazine (PHENERGAN) 25 MG tablet Take 25 mg by mouth every 6 (six) hours. 04/23/19   [provider]  venlafaxine XR (EFFEXOR XR) 37.5 MG 24 hr capsule Take 1 capsule (37.5 mg total) by mouth at bedtime. 04/16/19   Jearld Fenton, NP  ZOLMitriptan (ZOMIG) 2.5 MG tablet Take 2.5 mg by mouth 2 (two) times daily as needed. 04/25/19   [provider]    Family History Family History  Problem Relation Age of Onset  . Hypertension Father   . Diabetes Father   . Hypertension Mother   . Cancer Mother        uterine  . Stroke Mother   . Diabetes Maternal Aunt   . Diabetes Maternal Uncle   . Cancer Maternal Grandmother        PANCREATIC  . Hypertension Maternal Grandmother   . Diabetes Maternal Grandmother   .  Cancer Maternal Grandfather        LUNG  . Hypertension Maternal Grandfather   . Diabetes Maternal Grandfather   . Cancer Paternal Grandmother        THROAT  . Alzheimer's disease Paternal Grandmother     Social History Social History   Tobacco Use  . Smoking status: Never Smoker  . Smokeless tobacco: Never Used  Substance Use Topics  . Alcohol use: Yes    Comment: occ  . Drug use: No     Allergies   Bacitra-neomycin-polymyxin-hc   Review of Systems Review of Systems All other systems reviewed and are negative except that which was mentioned in HPI   Physical Exam Updated Vital Signs BP (!) 136/98 (BP Location: Right Arm)   Pulse 82   Temp 98.1 F (36.7 C) (Oral)   Resp 16   LMP 07/12/2011   SpO2 98%    Physical Exam Vitals signs and nursing note reviewed.  Constitutional:      General: She is not in acute distress.    Appearance: She is well-developed.     Comments: Uncomfortable, eyes closed, sheet over head  HENT:     Head: Normocephalic and atraumatic.  Eyes:     Extraocular Movements: Extraocular movements intact.     Conjunctiva/sclera: Conjunctivae normal.     Pupils: Pupils are equal, round, and reactive to light.  Neck:     Musculoskeletal: Neck supple.  Cardiovascular:     Rate and Rhythm: Normal rate and regular rhythm.     Heart sounds: Normal heart sounds. No murmur.  Pulmonary:     Effort: Pulmonary effort is normal. No respiratory distress.     Breath sounds: Normal breath sounds.  Abdominal:     General: Bowel sounds are normal. There is no distension.     Palpations: Abdomen is soft.     Tenderness: There is no abdominal tenderness.  Skin:    General: Skin is warm and dry.  Neurological:     Mental Status: She is alert and oriented to person, place, and time.     Cranial Nerves: No cranial nerve deficit.     Motor: No abnormal muscle tone.     Deep Tendon Reflexes: Reflexes are normal and symmetric.     Comments: Exam somewhat limited by patient cooperation but fluent speech, no clonus 5/5 strength and normal sensation x all 4 extremities  Psychiatric:        Thought Content: Thought content normal.        Judgment: Judgment normal.      ED Treatments / Results  Labs (all labs ordered are listed, but only abnormal results are displayed) Labs Reviewed  URINALYSIS, ROUTINE W REFLEX MICROSCOPIC - Abnormal; Notable for the following components:      Result Value   APPearance HAZY (*)    Hgb urine dipstick TRACE (*)    All other components within normal limits  URINALYSIS, MICROSCOPIC (REFLEX) - Abnormal; Notable for the following components:   Bacteria, UA MANY (*)    All other components within normal limits  NOVEL CORONAVIRUS, NAA (HOSP ORDER,  SEND-OUT TO REF LAB; TAT 18-24 HRS)    EKG None  Radiology Ct Head Wo Contrast  Result Date: 05/05/2019 CLINICAL DATA:  Severe headache EXAM: CT HEAD WITHOUT CONTRAST TECHNIQUE: Contiguous axial images were obtained from the base of the skull through the vertex without intravenous contrast. COMPARISON:  Dec 08, 2018. FINDINGS: Brain: The ventricles are normal in size and configuration.  There is no intracranial mass, hemorrhage, extra-axial fluid collection, or midline shift. The brain parenchyma appears unremarkable. No acute infarct evident. Vascular: There is no hyperdense vessel. There is no evident vascular calcification. Skull: The bony calvarium appears intact. Sinuses/Orbits: There are foci of ethmoid air cell opacification bilaterally. Other visualized paranasal sinuses are clear. Orbits appear symmetric bilaterally. Other: Mastoid air cells are clear. IMPRESSION: Foci of5 ethmoid air cell opacification bilaterally. Study otherwise unremarkable. Electronically Signed   By: Lowella Grip III M.D.   On: 05/05/2019 09:32    Procedures Procedures (including critical care time)  Medications Ordered in ED Medications  sodium chloride 0.9 % bolus 1,000 mL (0 mLs Intravenous Stopped 05/05/19 1216)  diphenhydrAMINE (BENADRYL) injection 25 mg (25 mg Intravenous Given 05/05/19 0842)  prochlorperazine (COMPAZINE) injection 10 mg (10 mg Intravenous Given 05/05/19 0842)  dexamethasone (DECADRON) injection 10 mg (10 mg Intravenous Given 05/05/19 0841)  ketorolac (TORADOL) 30 MG/ML injection 30 mg (30 mg Intravenous Given 05/05/19 1216)  droperidol (INAPSINE) 2.5 MG/ML injection 2.5 mg (2.5 mg Intravenous Given 05/05/19 1227)     Initial Impression / Assessment and Plan / ED Course  I have reviewed the triage vital signs and the nursing notes.  Pertinent labs & imaging results that were available during my care of the patient were reviewed by me and considered in my medical decision making  (see chart for details).        Patient reports gradual onset of headache and has h/o migraines, thus w/ photophobia and nausea her current presentation is suggestive of migraine rather than SAH. However, given her reports of the most severe headache she is ever had, obtained head CT which was reassuring.  Because of other symptoms including sore throat, I discussed the possibility of COVID-19 infection and recommended outpatient testing.  On reassessment after above migraine medications, patient was resting comfortably.  Her pain had improved from 10/10-3/10 in intensity and she felt comfortable going home.  I discussed supportive measures for migraine.  She has follow-up with her outpatient physician in the next 1 to 2 weeks.  I have extensively reviewed return precautions and she voiced understanding.  Linda [name] was evaluated in Emergency Department on 05/05/2019 for the symptoms described in the history of present illness. She was evaluated in the context of the global COVID-19 pandemic, which necessitated consideration that the patient might be at risk for infection with the SARS-CoV-2 virus that causes COVID-19. Institutional protocols and algorithms that pertain to the evaluation of patients at risk for COVID-19 are in a state of rapid change based on information released by regulatory bodies including the CDC and federal and state organizations. These policies and algorithms were followed during the patient's care in the ED.   Final Clinical Impressions(s) / ED Diagnoses   Final diagnoses:  Bad headache  Sore throat    ED Discharge Orders    None       Soren Pigman, Wenda Overland, MD 05/05/19 1525

## 2019-05-05 NOTE — ED Triage Notes (Addendum)
Sore throat and H/A since last night, with nausea and photophobia. Took Zomig without relief

## 2019-05-08 ENCOUNTER — Other Ambulatory Visit: Payer: Self-pay | Admitting: Internal Medicine

## 2019-05-09 ENCOUNTER — Other Ambulatory Visit: Payer: Self-pay | Admitting: Internal Medicine

## 2019-05-09 LAB — NOVEL CORONAVIRUS, NAA (HOSP ORDER, SEND-OUT TO REF LAB; TAT 18-24 HRS): SARS-CoV-2, NAA: NOT DETECTED

## 2019-05-17 ENCOUNTER — Other Ambulatory Visit: Payer: Self-pay

## 2019-05-17 ENCOUNTER — Ambulatory Visit (INDEPENDENT_AMBULATORY_CARE_PROVIDER_SITE_OTHER): Payer: Federal, State, Local not specified - PPO | Admitting: Internal Medicine

## 2019-05-17 ENCOUNTER — Encounter: Payer: Self-pay | Admitting: Internal Medicine

## 2019-05-17 ENCOUNTER — Other Ambulatory Visit: Payer: Self-pay | Admitting: Internal Medicine

## 2019-05-17 VITALS — BP 132/84 | HR 82 | Temp 98.3°F | Ht 67.75 in | Wt 183.0 lb

## 2019-05-17 DIAGNOSIS — Z79899 Other long term (current) drug therapy: Secondary | ICD-10-CM | POA: Diagnosis not present

## 2019-05-17 DIAGNOSIS — F32A Depression, unspecified: Secondary | ICD-10-CM

## 2019-05-17 DIAGNOSIS — F419 Anxiety disorder, unspecified: Secondary | ICD-10-CM

## 2019-05-17 DIAGNOSIS — F329 Major depressive disorder, single episode, unspecified: Secondary | ICD-10-CM

## 2019-05-17 DIAGNOSIS — M79604 Pain in right leg: Secondary | ICD-10-CM

## 2019-05-17 DIAGNOSIS — Z0000 Encounter for general adult medical examination without abnormal findings: Secondary | ICD-10-CM | POA: Diagnosis not present

## 2019-05-17 DIAGNOSIS — G43109 Migraine with aura, not intractable, without status migrainosus: Secondary | ICD-10-CM | POA: Diagnosis not present

## 2019-05-17 DIAGNOSIS — K219 Gastro-esophageal reflux disease without esophagitis: Secondary | ICD-10-CM

## 2019-05-17 DIAGNOSIS — J302 Other seasonal allergic rhinitis: Secondary | ICD-10-CM

## 2019-05-17 MED ORDER — ALPRAZOLAM 0.25 MG PO TABS: 0.2500 mg | ORAL_TABLET | Freq: Two times a day (BID) | ORAL | 0 refills | Status: DC | PRN

## 2019-05-17 MED ORDER — BUPROPION HCL ER (XL) 150 MG PO TB24: 150.0000 mg | ORAL_TABLET | Freq: Every day | ORAL | 2 refills | Status: DC

## 2019-05-17 NOTE — Patient Instructions (Signed)
Health Maintenance, Female Adopting a healthy lifestyle and getting preventive care are important in promoting health and wellness. Ask your health care provider about:  The right schedule for you to have regular tests and exams.  Things you can do on your own to prevent diseases and keep yourself healthy. What should I know about diet, weight, and exercise? Eat a healthy diet   Eat a diet that includes plenty of vegetables, fruits, low-fat dairy products, and lean protein.  Do not eat a lot of foods that are high in solid fats, added sugars, or sodium. Maintain a healthy weight Body mass index (BMI) is used to identify weight problems. It estimates body fat based on height and weight. Your health care provider can help determine your BMI and help you achieve or maintain a healthy weight. Get regular exercise Get regular exercise. This is one of the most important things you can do for your health. Most adults should:  Exercise for at least 150 minutes each week. The exercise should increase your heart rate and make you sweat (moderate-intensity exercise).  Do strengthening exercises at least twice a week. This is in addition to the moderate-intensity exercise.  Spend less time sitting. Even light physical activity can be beneficial. Watch cholesterol and blood lipids Have your blood tested for lipids and cholesterol at 41 years of age, then have this test every 5 years. Have your cholesterol levels checked more often if:  Your lipid or cholesterol levels are high.  You are older than 40 years of age.  You are at high risk for heart disease. What should I know about cancer screening? Depending on your health history and family history, you may need to have cancer screening at various ages. This may include screening for:  Breast cancer.  Cervical cancer.  Colorectal cancer.  Skin cancer.  Lung cancer. What should I know about heart disease, diabetes, and high blood  pressure? Blood pressure and heart disease  High blood pressure causes heart disease and increases the risk of stroke. This is more likely to develop in people who have high blood pressure readings, are of African descent, or are overweight.  Have your blood pressure checked: ? Every 3-5 years if you are 18-39 years of age. ? Every year if you are 40 years old or older. Diabetes Have regular diabetes screenings. This checks your fasting blood sugar level. Have the screening done:  Once every three years after age 40 if you are at a normal weight and have a low risk for diabetes.  More often and at a younger age if you are overweight or have a high risk for diabetes. What should I know about preventing infection? Hepatitis B If you have a higher risk for hepatitis B, you should be screened for this virus. Talk with your health care provider to find out if you are at risk for hepatitis B infection. Hepatitis C Testing is recommended for:  Everyone born from 1945 through 1965.  Anyone with known risk factors for hepatitis C. Sexually transmitted infections (STIs)  Get screened for STIs, including gonorrhea and chlamydia, if: ? You are sexually active and are younger than 41 years of age. ? You are older than 41 years of age and your health care provider tells you that you are at risk for this type of infection. ? Your sexual activity has changed since you were last screened, and you are at increased risk for chlamydia or gonorrhea. Ask your health care provider if   you are at risk.  Ask your health care provider about whether you are at high risk for HIV. Your health care provider may recommend a prescription medicine to help prevent HIV infection. If you choose to take medicine to prevent HIV, you should first get tested for HIV. You should then be tested every 3 months for as long as you are taking the medicine. Pregnancy  If you are about to stop having your period (premenopausal) and  you may become pregnant, seek counseling before you get pregnant.  Take 400 to 800 micrograms (mcg) of folic acid every day if you become pregnant.  Ask for birth control (contraception) if you want to prevent pregnancy. Osteoporosis and menopause Osteoporosis is a disease in which the bones lose minerals and strength with aging. This can result in bone fractures. If you are 65 years old or older, or if you are at risk for osteoporosis and fractures, ask your health care provider if you should:  Be screened for bone loss.  Take a calcium or vitamin D supplement to lower your risk of fractures.  Be given hormone replacement therapy (HRT) to treat symptoms of menopause. Follow these instructions at home: Lifestyle  Do not use any products that contain nicotine or tobacco, such as cigarettes, e-cigarettes, and chewing tobacco. If you need help quitting, ask your health care provider.  Do not use street drugs.  Do not share needles.  Ask your health care provider for help if you need support or information about quitting drugs. Alcohol use  Do not drink alcohol if: ? Your health care provider tells you not to drink. ? You are pregnant, may be pregnant, or are planning to become pregnant.  If you drink alcohol: ? Limit how much you use to 0-1 drink a day. ? Limit intake if you are breastfeeding.  Be aware of how much alcohol is in your drink. In the [place] one drink equals one 12 oz bottle of beer (355 mL), one 5 oz glass of wine (148 mL), or one 1 oz glass of hard liquor (44 mL). General instructions  Schedule regular health, dental, and eye exams.  Stay current with your vaccines.  Tell your health care provider if: ? You often feel depressed. ? You have ever been abused or do not feel safe at home. Summary  Adopting a healthy lifestyle and getting preventive care are important in promoting health and wellness.  Follow your health care provider's instructions about healthy  diet, exercising, and getting tested or screened for diseases.  Follow your health care provider's instructions on monitoring your cholesterol and blood pressure. This information is not intended to replace advice given to you by your health care provider. Make sure you discuss any questions you have with your health care provider. Document Released: 01/11/2011 Document Revised: 06/21/2018 Document Reviewed: 06/21/2018 Elsevier Patient Education  2020 Elsevier Inc.  

## 2019-05-17 NOTE — Progress Notes (Signed)
Subjective:    Patient ID: Linda [name], female    DOB: 02-13-1976, 43 y.o.   MRN: [id number]  HPI  Pt presents to the clinic today for her annual exam. She is also due for follow up chronic conditions.   Seasonal Allergies: Seems to be occurring all year long. She takes Zyrtec and Flonase intermittently but not consistently.   Migraines: Recent ER visit 10/24 for the same. These occur weekly. She takes Zomig as needed. She is not following with neurology.   GERD: Triggered by red meat, spicy food, greasy foods and tea. She takes Pantoprazole daily and denies breakthrough symptoms. There is no upper GI on file.   Anxiety and Depression: Managed on Effexor and Hydroxyzine but she does not feel like this is controlling her symptoms well enough. She is not currently seeing a therapist. She denies SI/HI.  Flu: 02/2019 Tetanus: 04/2016 Pap Smear: 2012, hysterectomy Mammogram: 04/2019 Vision Screening: Annually Dentist: Annually  Diet: She does eat meat. She consumes fruits and veggies daily. She tries to avoid fried foods. She drinks mostly water. Exercise: Walking  Review of Systems  Past Medical History:  Diagnosis Date  . Anxiety   . Asthma   . GERD (gastroesophageal reflux disease)   . Heart murmur    while pregnant  . Migraine     Current Outpatient Medications  Medication Sig Dispense Refill  . acetaminophen (TYLENOL) 500 MG tablet Take 500 mg by mouth every 6 (six) hours as needed for fever.    . hydrOXYzine (ATARAX/VISTARIL) 10 MG tablet TAKE 1 TABLET (10 MG TOTAL) BY MOUTH 2 (TWO) TIMES DAILY AS NEEDED.    Marland Kitchen promethazine (PHENERGAN) 25 MG tablet Take 25 mg by mouth every 6 (six) hours.    Marland Kitchen venlafaxine XR (EFFEXOR-XR) 37.5 MG 24 hr capsule TAKE 1 CAPSULE (37.5 MG TOTAL) BY MOUTH AT BEDTIME. 90 capsule 1  . ZOLMitriptan (ZOMIG) 2.5 MG tablet Take 2.5 mg by mouth 2 (two) times daily as needed.     No current facility-administered medications for this visit.     Allergies  Allergen Reactions  . Bacitra-Neomycin-Polymyxin-Hc Other (See Comments)    Pain and swelling of ear canal    Family History  Problem Relation Age of Onset  . Hypertension Father   . Diabetes Father   . Hypertension Mother   . Cancer Mother        uterine  . Stroke Mother   . Diabetes Maternal Aunt   . Diabetes Maternal Uncle   . Cancer Maternal Grandmother        PANCREATIC  . Hypertension Maternal Grandmother   . Diabetes Maternal Grandmother   . Cancer Maternal Grandfather        LUNG  . Hypertension Maternal Grandfather   . Diabetes Maternal Grandfather   . Cancer Paternal Grandmother        THROAT  . Alzheimer's disease Paternal Grandmother     Social History   Socioeconomic History  . Marital status: Divorced    Spouse name: Not on file  . Number of children: Not on file  . Years of education: Not on file  . Highest education level: Not on file  Occupational History  . Not on file  Social Needs  . Financial resource strain: Not on file  . Food insecurity    Worry: Not on file    Inability: Not on file  . Transportation needs    Medical: Not on file    Non-medical:  Not on file  Tobacco Use  . Smoking status: Never Smoker  . Smokeless tobacco: Never Used  Substance and Sexual Activity  . Alcohol use: Yes    Comment: occ  . Drug use: No  . Sexual activity: Not on file  Lifestyle  . Physical activity    Days per week: Not on file    Minutes per session: Not on file  . Stress: Not on file  Relationships  . Social Herbalist on phone: Not on file    Gets together: Not on file    Attends religious service: Not on file    Active member of club or organization: Not on file    Attends meetings of clubs or organizations: Not on file    Relationship status: Not on file  . Intimate partner violence    Fear of current or ex partner: Not on file    Emotionally abused: Not on file    Physically abused: Not on file    Forced sexual  activity: Not on file  Other Topics Concern  . Not on file  Social History Narrative  . Not on file     Constitutional: Pt reports headaches. Denies fever, malaise, fatigue, or abrupt weight changes.  HEENT: Denies eye pain, eye redness, ear pain, ringing in the ears, wax buildup, runny nose, nasal congestion, bloody nose, or sore throat. Respiratory: Denies difficulty breathing, shortness of breath, cough or sputum production.   Cardiovascular: Denies chest pain, chest tightness, palpitations or swelling in the hands or feet.  Gastrointestinal: Denies abdominal pain, bloating, constipation, diarrhea or blood in the stool.  GU: Denies urgency, frequency, pain with urination, burning sensation, blood in urine, odor or discharge. Musculoskeletal: Pt reports right leg pain. Denies decrease in range of motion, difficulty with gait, muscle pain or joint pain and swelling.  Skin: Denies redness, rashes, lesions or ulcercations.  Neurological: Denies dizziness, difficulty with memory, difficulty with speech or problems with balance and coordination.  Psych: Pt has a history of anxiety and depression. Denies SI/HI.  No other specific complaints in a complete review of systems (except as listed in HPI above).     Objective:   Physical Exam BP 132/84   Pulse 82   Temp 98.3 F (36.8 C) (Temporal)   Ht 5' 7.75" (1.721 m)   Wt 183 lb (83 kg)   LMP 07/12/2011   SpO2 98%   BMI 28.03 kg/m   Wt Readings from Last 3 Encounters:  04/16/19 180 lb (81.6 kg)  03/11/19 175 lb (79.4 kg)  02/07/19 171 lb (77.6 kg)    General: Appears her stated age, well developed, well nourished in NAD. Skin: Warm, dry and intact. No rashes noted. HEENT: Head: normal shape and size; Eyes: sclera white, no icterus, conjunctiva pink, PERRLA and EOMs intact; Ears: Tm's gray and intact, normal light reflex;  Neck:  Neck supple, trachea midline. No masses, lumps or thyromegaly present.  Cardiovascular: Normal rate  and rhythm. S1,S2 noted.  No murmur, rubs or gallops noted. No JVD or BLE edema.  Pulmonary/Chest: Normal effort and positive vesicular breath sounds. No respiratory distress. No wheezes, rales or ronchi noted.  Abdomen: Soft and nontender. Normal bowel sounds. No distention or masses noted. Liver, spleen and kidneys non palpable. Musculoskeletal: Normal flexion and extension of the right knee. No joint swelling noted. Strength 5/5 BUE/BLE. No difficulty with gait.  Neurological: Alert and oriented. Cranial nerves II-XII grossly intact. Coordination normal.  Psychiatric: Mood  and affect mildly flat. Behavior is normal. Judgment and thought content normal.     BMET    Component Value Date/Time   NA 138 12/08/2018 0325   NA 137 05/31/2013 0846   K 3.4 (L) 12/08/2018 0325   K 3.8 05/31/2013 0846   CL 105 12/08/2018 0325   CL 106 05/31/2013 0846   CO2 22 12/08/2018 0325   CO2 26 05/31/2013 0846   GLUCOSE 94 12/08/2018 0325   GLUCOSE 93 05/31/2013 0846   BUN 11 12/08/2018 0325   BUN 10 05/31/2013 0846   CREATININE 0.58 12/08/2018 0325   CREATININE 0.76 05/31/2013 0846   CALCIUM 8.5 (L) 12/08/2018 0325   CALCIUM 8.2 (L) 05/31/2013 0846   GFRNONAA >60 12/08/2018 0325   GFRNONAA >60 05/31/2013 0846   GFRAA >60 12/08/2018 0325   GFRAA >60 05/31/2013 0846    Lipid Panel     Component Value Date/Time   CHOL 194 01/05/2018 1457   TRIG 202.0 (H) 01/05/2018 1457   HDL 84.20 01/05/2018 1457   CHOLHDL 2 01/05/2018 1457   VLDL 40.4 (H) 01/05/2018 1457   LDLCALC 86 01/03/2017 1157    CBC    Component Value Date/Time   WBC 7.7 12/08/2018 0325   RBC 3.89 12/08/2018 0325   HGB 12.3 12/08/2018 0325   HGB 13.3 05/31/2013 0846   HCT 36.7 12/08/2018 0325   HCT 39.2 05/31/2013 0846   PLT 249 12/08/2018 0325   PLT 219 05/31/2013 0846   MCV 94.3 12/08/2018 0325   MCV 88 05/31/2013 0846   MCH 31.6 12/08/2018 0325   MCHC 33.5 12/08/2018 0325   RDW 13.2 12/08/2018 0325   RDW 12.8  05/31/2013 0846   LYMPHSABS 0.5 (L) 10/21/2018 1634   LYMPHSABS 2.1 05/31/2013 0846   MONOABS 0.2 10/21/2018 1634   MONOABS 0.4 05/31/2013 0846   EOSABS 0.0 10/21/2018 1634   EOSABS 0.1 05/31/2013 0846   BASOSABS 0.0 10/21/2018 1634   BASOSABS 0.1 05/31/2013 0846    Hgb A1C Lab Results  Component Value Date   HGBA1C 6.1 09/02/2015            Assessment & Plan:  Preventative Health Maintenance:  Flu and tetanus UTD No longer needs pap smears Mammogram UTD Encouraged her to consume a balanced diet and exercise regimen Advised her to see an eye doctor and dentist annually Will check CBC, CMET, Lipid, Vit D and B12 today  Right Leg Pain:  Will check Magnesium  RTC in one year, sooner if needed Webb Silversmith, NP

## 2019-05-18 LAB — COMPREHENSIVE METABOLIC PANEL
ALT: 16 U/L (ref 0–35)
AST: 18 U/L (ref 0–37)
Albumin: 4.3 g/dL (ref 3.5–5.2)
Alkaline Phosphatase: 63 U/L (ref 39–117)
BUN: 14 mg/dL (ref 6–23)
CO2: 28 mEq/L (ref 19–32)
Calcium: 9.1 mg/dL (ref 8.4–10.5)
Chloride: 102 mEq/L (ref 96–112)
Creatinine, Ser: 0.93 mg/dL (ref 0.40–1.20)
GFR: 80.18 mL/min (ref >60.00)
Glucose, Bld: 78 mg/dL (ref 70–99)
Potassium: 4.6 mEq/L (ref 3.5–5.1)
Sodium: 138 mEq/L (ref 135–145)
Total Bilirubin: 0.4 mg/dL (ref 0.2–1.2)
Total Protein: 7 g/dL (ref 6.0–8.3)

## 2019-05-18 LAB — CBC
HCT: 40.3 % (ref 36.0–46.0)
Hemoglobin: 13.5 g/dL (ref 12.0–15.0)
MCHC: 33.4 g/dL (ref 30.0–36.0)
MCV: 94.3 fl (ref 78.0–100.0)
Platelets: 266 10*3/uL (ref 150.0–400.0)
RBC: 4.27 Mil/uL (ref 3.87–5.11)
RDW: 12.9 % (ref 11.5–15.5)
WBC: 7.7 10*3/uL (ref 4.0–10.5)

## 2019-05-18 LAB — LIPID PANEL
Cholesterol: 232 mg/dL — ABNORMAL HIGH (ref 0–200)
HDL: 102.6 mg/dL (ref >39.00)
LDL Cholesterol: 94 mg/dL (ref 0–99)
NonHDL: 129.16
Total CHOL/HDL Ratio: 2
Triglycerides: 176 mg/dL — ABNORMAL HIGH (ref 0.0–149.0)
VLDL: 35.2 mg/dL (ref 0.0–40.0)

## 2019-05-18 LAB — MAGNESIUM: Magnesium: 2.2 mg/dL (ref 1.5–2.5)

## 2019-05-18 LAB — VITAMIN B12: Vitamin B-12: 252 pg/mL (ref 211–911)

## 2019-05-18 LAB — VITAMIN D 25 HYDROXY (VIT D DEFICIENCY, FRACTURES): VITD: 16.79 ng/mL — ABNORMAL LOW (ref 30.00–100.00)

## 2019-05-18 NOTE — Assessment & Plan Note (Signed)
Continue Zomig as needed Try to avoid triggers

## 2019-05-18 NOTE — Assessment & Plan Note (Signed)
Deteriorated Continue Effexor and Xanax CSA and UDS today RX for Wellbutrin 150 mg XL

## 2019-05-18 NOTE — Assessment & Plan Note (Signed)
Continue Zyrtec and Flonase.

## 2019-05-18 NOTE — Assessment & Plan Note (Signed)
CBC and CMET today Continue Pantoprazole daily

## 2019-05-19 LAB — PAIN MGMT, PROFILE 8 W/CONF, U
6 Acetylmorphine: NEGATIVE ng/mL
Alcohol Metabolites: POSITIVE ng/mL — AB (ref <500)
Amphetamines: NEGATIVE ng/mL
Benzodiazepines: NEGATIVE ng/mL
Buprenorphine, Urine: NEGATIVE ng/mL
Cocaine Metabolite: NEGATIVE ng/mL
Codeine: NEGATIVE ng/mL
Creatinine: 119.7 mg/dL
Ethyl Glucuronide (ETG): 4774 ng/mL
Ethyl Sulfate (ETS): 3276 ng/mL
Hydrocodone: NEGATIVE ng/mL
Hydromorphone: NEGATIVE ng/mL
MDMA: NEGATIVE ng/mL
Marijuana Metabolite: NEGATIVE ng/mL
Morphine: NEGATIVE ng/mL
Norhydrocodone: NEGATIVE ng/mL
Opiates: NEGATIVE ng/mL
Oxidant: NEGATIVE ug/mL
Oxycodone: NEGATIVE ng/mL
pH: 7.4 (ref 4.5–9.0)

## 2019-05-23 ENCOUNTER — Encounter: Payer: Self-pay | Admitting: Internal Medicine

## 2019-05-23 ENCOUNTER — Ambulatory Visit (INDEPENDENT_AMBULATORY_CARE_PROVIDER_SITE_OTHER): Payer: Federal, State, Local not specified - PPO | Admitting: Internal Medicine

## 2019-05-23 DIAGNOSIS — G43C0 Periodic headache syndromes in child or adult, not intractable: Secondary | ICD-10-CM | POA: Diagnosis not present

## 2019-05-23 MED ORDER — VITAMIN D (ERGOCALCIFEROL) 1.25 MG (50000 UNIT) PO CAPS: 50000.0000 [IU] | ORAL_CAPSULE | ORAL | 0 refills | Status: DC

## 2019-05-23 MED ORDER — ESCITALOPRAM OXALATE 10 MG PO TABS: 10.0000 mg | ORAL_TABLET | Freq: Every day | ORAL | 2 refills | Status: DC

## 2019-05-23 MED ORDER — CYCLOBENZAPRINE HCL 5 MG PO TABS: 5.0000 mg | ORAL_TABLET | Freq: Every evening | ORAL | 0 refills | Status: DC | PRN

## 2019-05-23 MED ORDER — SUMATRIPTAN SUCCINATE 50 MG PO TABS: 50.0000 mg | ORAL_TABLET | ORAL | 0 refills | Status: DC | PRN

## 2019-05-23 MED ORDER — KETOROLAC TROMETHAMINE 10 MG PO TABS: 10.0000 mg | ORAL_TABLET | Freq: Three times a day (TID) | ORAL | 0 refills | Status: DC | PRN

## 2019-05-23 NOTE — Progress Notes (Deleted)
Subjective:    Patient ID: Linda [name], female    DOB: 07-22-1975, 43 y.o.   MRN: [id number]  HPI  Pt presents to the clinic today for ER follow up. She went to the ER 10/24 with c/o headache and sore throat. The headache progressed over night and she woke up feeling like she couldn't breath. She called EMS. They gave her Benadryl but did not transport her to the hospital. She reports associated nausea, dry heaves, diarrhea, sensitivity to light and chills. CT head was negative. Labs including COVID were negative. She was treated with IV fluid bolus, Benadryl, Compazine, Decadron, Toradol and Droperidol. She was discharged and advised to follow up with her PCP. Since that time.  Review of Systems      Past Medical History:  Diagnosis Date  . Anxiety   . Asthma   . GERD (gastroesophageal reflux disease)   . Heart murmur    while pregnant  . Migraine     Current Outpatient Medications  Medication Sig Dispense Refill  . acetaminophen (TYLENOL) 500 MG tablet Take 500 mg by mouth every 6 (six) hours as needed for fever.    . ALPRAZolam (XANAX) 0.25 MG tablet Take 1 tablet (0.25 mg total) by mouth 2 (two) times daily as needed for anxiety. 20 tablet 0  . buPROPion (WELLBUTRIN XL) 150 MG 24 hr tablet Take 1 tablet (150 mg total) by mouth daily. 30 tablet 2  . promethazine (PHENERGAN) 25 MG tablet Take 25 mg by mouth every 6 (six) hours.    Marland Kitchen venlafaxine XR (EFFEXOR-XR) 37.5 MG 24 hr capsule TAKE 1 CAPSULE (37.5 MG TOTAL) BY MOUTH AT BEDTIME. 90 capsule 1  . ZOLMitriptan (ZOMIG) 2.5 MG tablet Take 2.5 mg by mouth 2 (two) times daily as needed.     No current facility-administered medications for this visit.     Allergies  Allergen Reactions  . Bacitra-Neomycin-Polymyxin-Hc Other (See Comments)    Pain and swelling of ear canal    Family History  Problem Relation Age of Onset  . Hypertension Father   . Diabetes Father   . Hypertension Mother   . Cancer Mother    uterine  . Stroke Mother   . Diabetes Maternal Aunt   . Diabetes Maternal Uncle   . Cancer Maternal Grandmother        PANCREATIC  . Hypertension Maternal Grandmother   . Diabetes Maternal Grandmother   . Cancer Maternal Grandfather        LUNG  . Hypertension Maternal Grandfather   . Diabetes Maternal Grandfather   . Cancer Paternal Grandmother        THROAT  . Alzheimer's disease Paternal Grandmother     Social History   Socioeconomic History  . Marital status: Divorced    Spouse name: Not on file  . Number of children: Not on file  . Years of education: Not on file  . Highest education level: Not on file  Occupational History  . Not on file  Social Needs  . Financial resource strain: Not on file  . Food insecurity    Worry: Not on file    Inability: Not on file  . Transportation needs    Medical: Not on file    Non-medical: Not on file  Tobacco Use  . Smoking status: Never Smoker  . Smokeless tobacco: Never Used  Substance and Sexual Activity  . Alcohol use: Yes    Comment: occ  . Drug use: No  .  Sexual activity: Not on file  Lifestyle  . Physical activity    Days per week: Not on file    Minutes per session: Not on file  . Stress: Not on file  Relationships  . Social Herbalist on phone: Not on file    Gets together: Not on file    Attends religious service: Not on file    Active member of club or organization: Not on file    Attends meetings of clubs or organizations: Not on file    Relationship status: Not on file  . Intimate partner violence    Fear of current or ex partner: Not on file    Emotionally abused: Not on file    Physically abused: Not on file    Forced sexual activity: Not on file  Other Topics Concern  . Not on file  Social History Narrative  . Not on file     Constitutional: Denies fever, malaise, fatigue, headache or abrupt weight changes.  HEENT: Denies eye pain, eye redness, ear pain, ringing in the ears, wax  buildup, runny nose, nasal congestion, bloody nose, or sore throat. Respiratory: Denies difficulty breathing, shortness of breath, cough or sputum production.   Cardiovascular: Denies chest pain, chest tightness, palpitations or swelling in the hands or feet.  Gastrointestinal: Denies abdominal pain, bloating, constipation, diarrhea or blood in the stool.  GU: Denies urgency, frequency, pain with urination, burning sensation, blood in urine, odor or discharge. Musculoskeletal: Denies decrease in range of motion, difficulty with gait, muscle pain or joint pain and swelling.  Skin: Denies redness, rashes, lesions or ulcercations.  Neurological: Denies dizziness, difficulty with memory, difficulty with speech or problems with balance and coordination.  Psych: Denies anxiety, depression, SI/HI.  No other specific complaints in a complete review of systems (except as listed in HPI above).  Objective:   Physical Exam   LMP 07/12/2011  Wt Readings from Last 3 Encounters:  05/17/19 183 lb (83 kg)  04/16/19 180 lb (81.6 kg)  03/11/19 175 lb (79.4 kg)    General: Appears their stated age, well developed, well nourished in NAD. Skin: Warm, dry and intact. No rashes, lesions or ulcerations noted. HEENT: Head: normal shape and size; Eyes: sclera white, no icterus, conjunctiva pink, PERRLA and EOMs intact; Ears: Tm's gray and intact, normal light reflex; Nose: mucosa pink and moist, septum midline; Throat/Mouth: Teeth present, mucosa pink and moist, no exudate, lesions or ulcerations noted.  Neck:  Neck supple, trachea midline. No masses, lumps or thyromegaly present.  Cardiovascular: Normal rate and rhythm. S1,S2 noted.  No murmur, rubs or gallops noted. No JVD or BLE edema. No carotid bruits noted. Pulmonary/Chest: Normal effort and positive vesicular breath sounds. No respiratory distress. No wheezes, rales or ronchi noted.  Abdomen: Soft and nontender. Normal bowel sounds. No distention or masses  noted. Liver, spleen and kidneys non palpable. Musculoskeletal: Normal range of motion. No signs of joint swelling. No difficulty with gait.  Neurological: Alert and oriented. Cranial nerves II-XII grossly intact. Coordination normal.  Psychiatric: Mood and affect normal. Behavior is normal. Judgment and thought content normal.   EKG:  BMET    Component Value Date/Time   NA 138 05/17/2019 1606   NA 137 05/31/2013 0846   K 4.6 05/17/2019 1606   K 3.8 05/31/2013 0846   CL 102 05/17/2019 1606   CL 106 05/31/2013 0846   CO2 28 05/17/2019 1606   CO2 26 05/31/2013 0846   GLUCOSE 78  05/17/2019 1606   GLUCOSE 93 05/31/2013 0846   BUN 14 05/17/2019 1606   BUN 10 05/31/2013 0846   CREATININE 0.93 05/17/2019 1606   CREATININE 0.76 05/31/2013 0846   CALCIUM 9.1 05/17/2019 1606   CALCIUM 8.2 (L) 05/31/2013 0846   GFRNONAA >60 12/08/2018 0325   GFRNONAA >60 05/31/2013 0846   GFRAA >60 12/08/2018 0325   GFRAA >60 05/31/2013 0846    Lipid Panel     Component Value Date/Time   CHOL 232 (H) 05/17/2019 1606   TRIG 176.0 (H) 05/17/2019 1606   HDL 102.60 05/17/2019 1606   CHOLHDL 2 05/17/2019 1606   VLDL 35.2 05/17/2019 1606   LDLCALC 94 05/17/2019 1606    CBC    Component Value Date/Time   WBC 7.7 05/17/2019 1606   RBC 4.27 05/17/2019 1606   HGB 13.5 05/17/2019 1606   HGB 13.3 05/31/2013 0846   HCT 40.3 05/17/2019 1606   HCT 39.2 05/31/2013 0846   PLT 266.0 05/17/2019 1606   PLT 219 05/31/2013 0846   MCV 94.3 05/17/2019 1606   MCV 88 05/31/2013 0846   MCH 31.6 12/08/2018 0325   MCHC 33.4 05/17/2019 1606   RDW 12.9 05/17/2019 1606   RDW 12.8 05/31/2013 0846   LYMPHSABS 0.5 (L) 10/21/2018 1634   LYMPHSABS 2.1 05/31/2013 0846   MONOABS 0.2 10/21/2018 1634   MONOABS 0.4 05/31/2013 0846   EOSABS 0.0 10/21/2018 1634   EOSABS 0.1 05/31/2013 0846   BASOSABS 0.0 10/21/2018 1634   BASOSABS 0.1 05/31/2013 0846    Hgb A1C Lab Results  Component Value Date   HGBA1C 6.1  09/02/2015           Assessment & Plan:

## 2019-05-24 NOTE — Progress Notes (Signed)
Subjective   Patient ID: Linda [name], female    DOB: May 18, 1976, 44 y.o.   MRN: [id number]  Virtual Visit via Video Note  I connected withTiffany M Oftedahl on 05/23/19 at  4:15 PM EST by a video enabled telemedicine application and verified that I am speaking with the correct person using two identifiers.  Location: Patient: Home Provider: Office  I discussed the limitations of evaluation and management by telemedicine and the availability of in person appointments. The patient expressed understanding and agreed to proceed.    HPI  Pt presents to the clinic today for ER follow up. She went to the ER 10/24 with c/o headache and sore throat. The headache progressed over night and she woke up feeling like she couldn't breath. She called EMS. They gave her Benadryl but did not transport her to the hospital. She reports associated nausea, dry heaves, diarrhea, sensitivity to light and chills. CT head was negative. Labs including COVID were negative. She was treated with IV fluid bolus, Benadryl, Compazine, Decadron, Toradol and Droperidol. She was discharged and advised to follow up with her PCP. Since that time, she reports she developed another migraine 2 days ago. The pain is located on the right side of her head. She describes the pain as pressure. She reports associated nausea and sensitivity to light but denies dizziness, sensitivity to sound or vomiting. She has tried BC powders, Excedrin and Zomig without any relief. She has failed Topamax, Zonnegran and Maxalt in the past. She has seen [name] with [facility] but reports she has an appt at [facility] 07/2019 for a second opinion.   Review of Systems          Past Medical History:  Diagnosis Date  . Anxiety   . Asthma   . GERD (gastroesophageal reflux disease)   . Heart murmur    while pregnant  . Migraine           Current Outpatient Medications  Medication Sig Dispense Refill  .  acetaminophen (TYLENOL) 500 MG tablet Take 500 mg by mouth every 6 (six) hours as needed for fever.    . ALPRAZolam (XANAX) 0.25 MG tablet Take 1 tablet (0.25 mg total) by mouth 2 (two) times daily as needed for anxiety. 20 tablet 0  . buPROPion (WELLBUTRIN XL) 150 MG 24 hr tablet Take 1 tablet (150 mg total) by mouth daily. 30 tablet 2  . promethazine (PHENERGAN) 25 MG tablet Take 25 mg by mouth every 6 (six) hours.    Marland Kitchen venlafaxine XR (EFFEXOR-XR) 37.5 MG 24 hr capsule TAKE 1 CAPSULE (37.5 MG TOTAL) BY MOUTH AT BEDTIME. 90 capsule 1  . ZOLMitriptan (ZOMIG) 2.5 MG tablet Take 2.5 mg by mouth 2 (two) times daily as needed.     No current facility-administered medications for this visit.          Allergies  Allergen Reactions  . Bacitra-Neomycin-Polymyxin-Hc Other (See Comments)    Pain and swelling of ear canal         Family History  Problem Relation Age of Onset  . Hypertension Father   . Diabetes Father   . Hypertension Mother   . Cancer Mother        uterine  . Stroke Mother   . Diabetes Maternal Aunt   . Diabetes Maternal Uncle   . Cancer Maternal Grandmother        PANCREATIC  . Hypertension Maternal Grandmother   . Diabetes Maternal Grandmother   .  Cancer Maternal Grandfather        LUNG  . Hypertension Maternal Grandfather   . Diabetes Maternal Grandfather   . Cancer Paternal Grandmother        THROAT  . Alzheimer's disease Paternal Grandmother     Social History        Socioeconomic History  . Marital status: Divorced    Spouse name: Not on file  . Number of children: Not on file  . Years of education: Not on file  . Highest education level: Not on file  Occupational History  . Not on file  Social Needs  . Financial resource strain: Not on file  . Food insecurity    Worry: Not on file    Inability: Not on file  . Transportation needs    Medical: Not on file    Non-medical: Not on file  Tobacco Use  .  Smoking status: Never Smoker  . Smokeless tobacco: Never Used  Substance and Sexual Activity  . Alcohol use: Yes    Comment: occ  . Drug use: No  . Sexual activity: Not on file  Lifestyle  . Physical activity    Days per week: Not on file    Minutes per session: Not on file  . Stress: Not on file  Relationships  . Social Herbalist on phone: Not on file    Gets together: Not on file    Attends religious service: Not on file    Active member of club or organization: Not on file    Attends meetings of clubs or organizations: Not on file    Relationship status: Not on file  . Intimate partner violence    Fear of current or ex partner: Not on file    Emotionally abused: Not on file    Physically abused: Not on file    Forced sexual activity: Not on file  Other Topics Concern  . Not on file  Social History Narrative  . Not on file     Constitutional: Pt reports headache. Denies fever, malaise, fatigue, or abrupt weight changes.  HEENT: Pt reports sensitivity to light. Denies eye pain, eye redness, ear pain, ringing in the ears, wax buildup, runny nose, nasal congestion, bloody nose, or sore throat. Respiratory: Denies difficulty breathing, shortness of breath, cough or sputum production.   Cardiovascular: Denies chest pain, chest tightness, palpitations or swelling in the hands or feet.  Gastrointestinal: Pt reports nausea. Denies abdominal pain, bloating, constipation, diarrhea or blood in the stool.  GU: Denies urgency, frequency, pain with urination, burning sensation, blood in urine, odor or discharge. Musculoskeletal: Denies decrease in range of motion, difficulty with gait, muscle pain or joint pain and swelling.  Skin: Denies redness, rashes, lesions or ulcercations.  Neurological: Denies dizziness, difficulty with memory, difficulty with speech or problems with balance and coordination.  Psych: Denies anxiety, depression, SI/HI.   No other specific complaints in a complete review of systems (except as listed in HPI above).  Objective:   Objective   Physical Exam      Wt Readings from Last 3 Encounters:  05/17/19 183 lb (83 kg)  04/16/19 180 lb (81.6 kg)  03/11/19 175 lb (79.4 kg)    General: Appears her stated age, well developed, well nourished in NAD. HEENT: Head: normal shape and size; Eyes: EOMs intact;  Pulmonary/Chest: Normal effort. No respiratory distress.  Neurological: Alert and oriented.  Coordination normal.   BMET Labs (Brief)  Component Value Date/Time   NA 138 05/17/2019 1606   NA 137 05/31/2013 0846   K 4.6 05/17/2019 1606   K 3.8 05/31/2013 0846   CL 102 05/17/2019 1606   CL 106 05/31/2013 0846   CO2 28 05/17/2019 1606   CO2 26 05/31/2013 0846   GLUCOSE 78 05/17/2019 1606   GLUCOSE 93 05/31/2013 0846   BUN 14 05/17/2019 1606   BUN 10 05/31/2013 0846   CREATININE 0.93 05/17/2019 1606   CREATININE 0.76 05/31/2013 0846   CALCIUM 9.1 05/17/2019 1606   CALCIUM 8.2 (L) 05/31/2013 0846   GFRNONAA >60 12/08/2018 0325   GFRNONAA >60 05/31/2013 0846   GFRAA >60 12/08/2018 0325   GFRAA >60 05/31/2013 0846      Lipid Panel  Labs (Brief)          Component Value Date/Time   CHOL 232 (H) 05/17/2019 1606   TRIG 176.0 (H) 05/17/2019 1606   HDL 102.60 05/17/2019 1606   CHOLHDL 2 05/17/2019 1606   VLDL 35.2 05/17/2019 1606   LDLCALC 94 05/17/2019 1606      CBC Labs (Brief)          Component Value Date/Time   WBC 7.7 05/17/2019 1606   RBC 4.27 05/17/2019 1606   HGB 13.5 05/17/2019 1606   HGB 13.3 05/31/2013 0846   HCT 40.3 05/17/2019 1606   HCT 39.2 05/31/2013 0846   PLT 266.0 05/17/2019 1606   PLT 219 05/31/2013 0846   MCV 94.3 05/17/2019 1606   MCV 88 05/31/2013 0846   MCH 31.6 12/08/2018 0325   MCHC 33.4 05/17/2019 1606   RDW 12.9 05/17/2019 1606   RDW 12.8 05/31/2013 0846   LYMPHSABS 0.5 (L)  10/21/2018 1634   LYMPHSABS 2.1 05/31/2013 0846   MONOABS 0.2 10/21/2018 1634   MONOABS 0.4 05/31/2013 0846   EOSABS 0.0 10/21/2018 1634   EOSABS 0.1 05/31/2013 0846   BASOSABS 0.0 10/21/2018 1634   BASOSABS 0.1 05/31/2013 0846      Hgb A1C Recent Labs       Lab Results  Component Value Date   HGBA1C 6.1 09/02/2015             Assessment & Plan:   ER Follow up for Headaches, Migraines:  ER notes, labs and imaging reviewed Stop Zomig for now RX for Toradol 10 mg Q8H prn- avoid other NSAID's OTC RX for Flexeril 5 mg QHS prn- sedation caution given RX for Sumatriptan 50 mg as needed Consider following up with [name] prior to your new pt appt at [facility] if symptoms persist or worsen  Return precautions discussed  Follow Up Instructions:   I discussed the assessment and treatment plan with the patient. The patient was provided an opportunity to ask questions and all were answered. The patient agreed with the plan and demonstrated an understanding of the instructions.  The patient was advised to call back or seek an in-person evaluation if the symptoms worsen or if the condition fails to improve as anticipated.    Webb Silversmith, NP

## 2019-05-24 NOTE — Patient Instructions (Signed)

## 2019-06-08 ENCOUNTER — Other Ambulatory Visit: Payer: Self-pay | Admitting: Internal Medicine

## 2019-06-13 ENCOUNTER — Encounter (HOSPITAL_COMMUNITY): Payer: Self-pay

## 2019-06-13 ENCOUNTER — Other Ambulatory Visit: Payer: Self-pay

## 2019-06-13 ENCOUNTER — Emergency Department (HOSPITAL_COMMUNITY)
Admission: EM | Admit: 2019-06-13 | Discharge: 2019-06-14 | Disposition: A | Discharge status: Home / Self Care | Payer: Federal, State, Local not specified - PPO | Attending: Emergency Medicine | Admitting: Emergency Medicine

## 2019-06-13 DIAGNOSIS — R9431 Abnormal electrocardiogram [ECG] [EKG]: Secondary | ICD-10-CM | POA: Diagnosis not present

## 2019-06-13 DIAGNOSIS — F332 Major depressive disorder, recurrent severe without psychotic features: Secondary | ICD-10-CM | POA: Insufficient documentation

## 2019-06-13 DIAGNOSIS — Z79899 Other long term (current) drug therapy: Secondary | ICD-10-CM | POA: Insufficient documentation

## 2019-06-13 DIAGNOSIS — R45851 Suicidal ideations: Secondary | ICD-10-CM | POA: Diagnosis not present

## 2019-06-13 DIAGNOSIS — J45909 Unspecified asthma, uncomplicated: Secondary | ICD-10-CM | POA: Insufficient documentation

## 2019-06-13 DIAGNOSIS — Y900 Blood alcohol level of less than 20 mg/100 ml: Secondary | ICD-10-CM | POA: Diagnosis not present

## 2019-06-13 DIAGNOSIS — F1010 Alcohol abuse, uncomplicated: Secondary | ICD-10-CM | POA: Diagnosis not present

## 2019-06-13 HISTORY — DX: Alcohol abuse, uncomplicated: F10.10

## 2019-06-13 LAB — COMPREHENSIVE METABOLIC PANEL
ALT: 20 U/L (ref 0–44)
AST: 20 U/L (ref 15–41)
Albumin: 4.1 g/dL (ref 3.5–5.0)
Alkaline Phosphatase: 64 U/L (ref 38–126)
Anion gap: 10 (ref 5–15)
BUN: 14 mg/dL (ref 6–20)
CO2: 22 mmol/L (ref 22–32)
Calcium: 8.5 mg/dL — ABNORMAL LOW (ref 8.9–10.3)
Chloride: 106 mmol/L (ref 98–111)
Creatinine, Ser: 0.74 mg/dL (ref 0.44–1.00)
GFR calc Af Amer: >60 mL/min (ref >60)
GFR calc non Af Amer: >60 mL/min (ref >60)
Glucose, Bld: 90 mg/dL (ref 70–99)
Potassium: 3.9 mmol/L (ref 3.5–5.1)
Sodium: 138 mmol/L (ref 135–145)
Total Bilirubin: 0.7 mg/dL (ref 0.3–1.2)
Total Protein: 7.2 g/dL (ref 6.5–8.1)

## 2019-06-13 LAB — CBC WITH DIFFERENTIAL/PLATELET
Abs Immature Granulocytes: 0.02 10*3/uL (ref 0.00–0.07)
Basophils Absolute: 0.1 10*3/uL (ref 0.0–0.1)
Basophils Relative: 1 %
Eosinophils Absolute: 0 10*3/uL (ref 0.0–0.5)
Eosinophils Relative: 1 %
HCT: 38.3 % (ref 36.0–46.0)
Hemoglobin: 12.7 g/dL (ref 12.0–15.0)
Immature Granulocytes: 0 %
Lymphocytes Relative: 28 %
Lymphs Abs: 2.1 10*3/uL (ref 0.7–4.0)
MCH: 31.4 pg (ref 26.0–34.0)
MCHC: 33.2 g/dL (ref 30.0–36.0)
MCV: 94.8 fL (ref 80.0–100.0)
Monocytes Absolute: 0.4 10*3/uL (ref 0.1–1.0)
Monocytes Relative: 6 %
Neutro Abs: 4.8 10*3/uL (ref 1.7–7.7)
Neutrophils Relative %: 64 %
Platelets: 245 10*3/uL (ref 150–400)
RBC: 4.04 MIL/uL (ref 3.87–5.11)
RDW: 12.1 % (ref 11.5–15.5)
WBC: 7.3 10*3/uL (ref 4.0–10.5)
nRBC: 0 % (ref 0.0–0.2)

## 2019-06-13 LAB — I-STAT BETA HCG BLOOD, ED (MC, WL, AP ONLY): I-stat hCG, quantitative: <5 m[IU]/mL (ref <5)

## 2019-06-13 LAB — ACETAMINOPHEN LEVEL: Acetaminophen (Tylenol), Serum: <10 ug/mL — ABNORMAL LOW (ref 10–30)

## 2019-06-13 LAB — RAPID URINE DRUG SCREEN, HOSP PERFORMED
Amphetamines: NOT DETECTED
Barbiturates: NOT DETECTED
Benzodiazepines: NOT DETECTED
Cocaine: NOT DETECTED
Opiates: NOT DETECTED
Tetrahydrocannabinol: NOT DETECTED

## 2019-06-13 LAB — SALICYLATE LEVEL: Salicylate Lvl: <7 mg/dL (ref 2.8–30.0)

## 2019-06-13 LAB — ETHANOL: Alcohol, Ethyl (B): 10 mg/dL — ABNORMAL HIGH (ref <10)

## 2019-06-13 MED ORDER — THIAMINE HCL 100 MG/ML IJ SOLN: 100.0000 mg | Freq: Every day | INTRAMUSCULAR | Status: DC

## 2019-06-13 MED ORDER — ONDANSETRON HCL 4 MG PO TABS: 4.0000 mg | ORAL_TABLET | Freq: Three times a day (TID) | ORAL | Status: DC | PRN

## 2019-06-13 MED ORDER — LORAZEPAM 2 MG/ML IJ SOLN: 0.0000 mg | Freq: Four times a day (QID) | INTRAMUSCULAR | Status: DC

## 2019-06-13 MED ORDER — ZOLPIDEM TARTRATE 5 MG PO TABS: 5.0000 mg | ORAL_TABLET | Freq: Every evening | ORAL | Status: DC | PRN

## 2019-06-13 MED ORDER — ONDANSETRON 4 MG PO TBDP
4.0000 mg | ORAL_TABLET | Freq: Once | ORAL | Status: AC
  Administered 2019-06-14: 4 mg via ORAL
  Filled 2019-06-13: qty 1

## 2019-06-13 MED ORDER — NICOTINE 21 MG/24HR TD PT24: 21.0000 mg | MEDICATED_PATCH | Freq: Every day | TRANSDERMAL | Status: DC

## 2019-06-13 MED ORDER — LORAZEPAM 2 MG/ML IJ SOLN: 0.0000 mg | Freq: Two times a day (BID) | INTRAMUSCULAR | Status: DC

## 2019-06-13 MED ORDER — VITAMIN B-1 100 MG PO TABS: 100.0000 mg | ORAL_TABLET | Freq: Every day | ORAL | Status: DC

## 2019-06-13 MED ORDER — ALUM & MAG HYDROXIDE-SIMETH 200-200-20 MG/5ML PO SUSP: 30.0000 mL | Freq: Four times a day (QID) | ORAL | Status: DC | PRN

## 2019-06-13 MED ORDER — LORAZEPAM 1 MG PO TABS: 0.0000 mg | ORAL_TABLET | Freq: Two times a day (BID) | ORAL | Status: DC

## 2019-06-13 MED ORDER — LORAZEPAM 1 MG PO TABS
0.0000 mg | ORAL_TABLET | Freq: Four times a day (QID) | ORAL | Status: DC
  Administered 2019-06-14: 1 mg via ORAL
  Administered 2019-06-14: 2 mg via ORAL
  Filled 2019-06-13: qty 1
  Filled 2019-06-13: qty 2

## 2019-06-13 MED ORDER — ACETAMINOPHEN 325 MG PO TABS: 650.0000 mg | ORAL_TABLET | ORAL | Status: DC | PRN

## 2019-06-13 NOTE — ED Triage Notes (Signed)
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 States she feels like she may have blacked out earlier but unsure what happened at what time. Unable to give this nurse any specific answers, states she normally just drinks until she passes out. Reports possibly wanting to hurt herself, does not have a plan and has never felt like hurting herself before.
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
## 2019-06-14 LAB — URINALYSIS, ROUTINE W REFLEX MICROSCOPIC
Bilirubin Urine: NEGATIVE
Glucose, UA: NEGATIVE mg/dL
Hgb urine dipstick: NEGATIVE
Ketones, ur: NEGATIVE mg/dL
Nitrite: NEGATIVE
Protein, ur: 30 mg/dL — AB
Specific Gravity, Urine: 1.029 (ref 1.005–1.030)
pH: 7 (ref 5.0–8.0)

## 2019-06-14 MED ORDER — ONDANSETRON 4 MG PO TBDP: 4.0000 mg | ORAL_TABLET | Freq: Three times a day (TID) | ORAL | 0 refills | Status: DC | PRN

## 2019-06-14 MED ORDER — CHLORDIAZEPOXIDE HCL 25 MG PO CAPS: ORAL_CAPSULE | ORAL | 0 refills | Status: DC

## 2019-06-14 NOTE — ED Notes (Signed)
TTS at bedside. 

## 2019-06-14 NOTE — Discharge Instructions (Signed)
Take the prescribed medication as directed.   As we discussed, DO NOT DRINK ALCOHOL WHILE TAKING LIBRIUM! Follow-up with one of the outpatient center.  I would try calling today to see if you can get established for treatment. Return to the ED for new or worsening symptoms.

## 2019-06-14 NOTE — ED Notes (Signed)
Pt verbalized discharge instructions and follow up care. Alert and ambulatory without assistance. Gait steady. No IV. Mother is picking pt up
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
## 2019-06-15 ENCOUNTER — Encounter: Payer: Self-pay | Admitting: Internal Medicine

## 2019-06-15 ENCOUNTER — Other Ambulatory Visit: Payer: Self-pay

## 2019-06-15 DIAGNOSIS — Z0000 Encounter for general adult medical examination without abnormal findings: Secondary | ICD-10-CM

## 2019-06-15 MED ORDER — ALPRAZOLAM 0.25 MG PO TABS: 0.2500 mg | ORAL_TABLET | Freq: Two times a day (BID) | ORAL | 0 refills | Status: DC | PRN

## 2019-06-15 MED ORDER — CHLORDIAZEPOXIDE HCL 25 MG PO CAPS: ORAL_CAPSULE | ORAL | 0 refills | Status: DC

## 2019-06-15 NOTE — Telephone Encounter (Signed)
Xanax last filled 05/17/2019.Marland KitchenMarland KitchenMarland Kitchen please advise at it is due tomorrow, Saturday

## 2019-06-15 NOTE — Telephone Encounter (Signed)
Spoke to pt in depth about her concerns... has appt with [facility] Monday and scheduled for hospital f/u virtual Monday... pt is almost out of Librium and is starting to feel Sx.... she wants to know if you would send her in some more medication to get her through at least the weekend

## 2019-06-15 NOTE — Telephone Encounter (Signed)
Will refill but she needs to schedule ER follow up with me ASAP

## 2019-06-15 NOTE — Addendum Note (Signed)
Addended by: Jearld Fenton on: 06/15/2019 01:15 PM   Modules accepted: Orders

## 2019-06-15 NOTE — Telephone Encounter (Signed)
Librium and Xanax refilled

## 2019-06-18 ENCOUNTER — Ambulatory Visit (HOSPITAL_COMMUNITY): Payer: Self-pay | Admitting: Licensed Clinical Social Worker

## 2019-06-18 ENCOUNTER — Ambulatory Visit (INDEPENDENT_AMBULATORY_CARE_PROVIDER_SITE_OTHER): Payer: Federal, State, Local not specified - PPO | Admitting: Internal Medicine

## 2019-06-18 ENCOUNTER — Encounter: Payer: Self-pay | Admitting: Internal Medicine

## 2019-06-18 ENCOUNTER — Other Ambulatory Visit: Payer: Self-pay

## 2019-06-18 DIAGNOSIS — F329 Major depressive disorder, single episode, unspecified: Secondary | ICD-10-CM | POA: Diagnosis not present

## 2019-06-18 DIAGNOSIS — Z658 Other specified problems related to psychosocial circumstances: Secondary | ICD-10-CM

## 2019-06-18 DIAGNOSIS — F419 Anxiety disorder, unspecified: Secondary | ICD-10-CM

## 2019-06-18 DIAGNOSIS — F32A Depression, unspecified: Secondary | ICD-10-CM

## 2019-06-18 DIAGNOSIS — F10930 Alcohol use, unspecified with withdrawal, uncomplicated: Secondary | ICD-10-CM

## 2019-06-18 DIAGNOSIS — F10230 Alcohol dependence with withdrawal, uncomplicated: Secondary | ICD-10-CM | POA: Diagnosis not present

## 2019-06-18 MED ORDER — BUPROPION HCL ER (XL) 300 MG PO TB24: 300.0000 mg | ORAL_TABLET | Freq: Every day | ORAL | 5 refills | Status: DC

## 2019-06-18 MED ORDER — LORAZEPAM 0.5 MG PO TABS: 0.5000 mg | ORAL_TABLET | Freq: Every day | ORAL | 0 refills | Status: DC | PRN
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
## 2019-06-18 NOTE — Patient Instructions (Signed)
Alcohol Withdrawal Syndrome When a person who drinks a lot of alcohol stops drinking, he or she may have unpleasant and serious symptoms. These symptoms are called alcohol withdrawal syndrome. This condition may be mild or severe. It can be life-threatening. It can cause:  Shaking that you cannot control (tremor).  Sweating.  Headache.  Feeling fearful, upset, grouchy, or depressed.  Trouble sleeping (insomnia).  Nightmares.  Fast or uneven heartbeats (palpitations).  Alcohol cravings.  Feeling sick to your stomach (nausea).  Throwing up (vomiting).  Being bothered by light and sounds.  Confusion.  Trouble thinking clearly.  Not being hungry (loss of appetite).  Big changes in mood (mood swings). If you have all of the following symptoms at the same time, get help right away:  High blood pressure.  Fast heartbeat.  Trouble breathing.  Seizures.  Seeing, hearing, feeling, smelling, or tasting things that are not there (hallucinations). These symptoms are known as delirium tremens (DTs). They must be treated at the hospital right away. Follow these instructions at home:   Take over-the-counter and prescription medicines only as told by your doctor. This includes vitamins.  Do not drink alcohol.  Do not drive until your doctor says that this is safe for you.  Have someone stay with you or be available in case you need help. This should be someone you trust. This person can help you with your symptoms. He or she can also help you to not drink.  Drink enough fluid to keep your pee (urine) pale yellow.  Think about joining a support group or a treatment program to help you stop drinking.  Keep all follow-up visits as told by your doctor. This is important. Contact a doctor if:  Your symptoms get worse.  You cannot eat or drink without throwing up.  You have a hard time not drinking alcohol.  You cannot stop drinking alcohol. Get help right away if:   You have fast or uneven heartbeats.  You have chest pain.  You have trouble breathing.  You have a seizure for the first time.  You see, hear, feel, smell, or taste something that is not there.  You get very confused. Summary  When a person who drinks a lot of alcohol stops drinking, he or she may have serious symptoms. This is called alcohol withdrawal syndrome.  Delirium tremens (DTs) is a group of life-threatening symptoms. You should get help right away if you have these symptoms.  Think about joining an alcohol support group or a treatment program. This information is not intended to replace advice given to you by your health care provider. Make sure you discuss any questions you have with your health care provider. Document Released: 12/15/2007 Document Revised: 06/10/2017 Document Reviewed: 03/04/2017 Elsevier Patient Education  2020 Elsevier Inc.  

## 2019-06-19 ENCOUNTER — Telehealth (HOSPITAL_COMMUNITY): Payer: Self-pay | Admitting: Licensed Clinical Social Worker

## 2019-06-21 ENCOUNTER — Ambulatory Visit (HOSPITAL_COMMUNITY): Payer: Federal, State, Local not specified - PPO | Admitting: Licensed Clinical Social Worker

## 2019-06-21 ENCOUNTER — Other Ambulatory Visit: Payer: Self-pay

## 2019-06-22 ENCOUNTER — Other Ambulatory Visit: Payer: Self-pay | Admitting: Internal Medicine

## 2019-06-22 MED ORDER — SUMATRIPTAN SUCCINATE 50 MG PO TABS: 50.0000 mg | ORAL_TABLET | ORAL | 0 refills | Status: DC | PRN

## 2019-07-02 ENCOUNTER — Encounter: Payer: Self-pay | Admitting: Internal Medicine

## 2019-07-11 ENCOUNTER — Other Ambulatory Visit: Payer: Self-pay | Admitting: Internal Medicine

## 2019-07-19 ENCOUNTER — Other Ambulatory Visit: Payer: Self-pay | Admitting: Internal Medicine

## 2019-07-19 MED ORDER — LORAZEPAM 0.5 MG PO TABS: 0.5000 mg | ORAL_TABLET | Freq: Every day | ORAL | 0 refills | Status: DC | PRN

## 2019-07-19 NOTE — Telephone Encounter (Signed)
Last filled 06/18/2019... please advise

## 2019-07-21 ENCOUNTER — Other Ambulatory Visit: Payer: Self-pay | Admitting: Internal Medicine

## 2019-07-23 ENCOUNTER — Encounter: Payer: Self-pay | Admitting: Internal Medicine

## 2019-07-23 NOTE — Telephone Encounter (Signed)
Last filled 06/22/2019.Marland KitchenMarland KitchenMarland Kitchen pease advise

## 2019-08-08 ENCOUNTER — Other Ambulatory Visit: Payer: Self-pay | Admitting: Internal Medicine

## 2019-08-18 ENCOUNTER — Other Ambulatory Visit: Payer: Self-pay | Admitting: Internal Medicine

## 2019-08-20 ENCOUNTER — Other Ambulatory Visit: Payer: Self-pay | Admitting: Internal Medicine

## 2019-08-20 ENCOUNTER — Encounter: Payer: Self-pay | Admitting: Internal Medicine

## 2019-08-20 MED ORDER — BUPROPION HCL ER (XL) 300 MG PO TB24: 300.0000 mg | ORAL_TABLET | Freq: Every day | ORAL | 0 refills | Status: DC

## 2019-08-20 NOTE — Telephone Encounter (Signed)
Forwarding to Dr Silvio Pate in [facility] absence

## 2019-08-20 NOTE — Telephone Encounter (Signed)
Last filled 07/19/2019... please advise  

## 2019-09-04 ENCOUNTER — Telehealth: Payer: Federal, State, Local not specified - PPO | Admitting: Physician Assistant

## 2019-09-04 DIAGNOSIS — J3081 Allergic rhinitis due to animal (cat) (dog) hair and dander: Secondary | ICD-10-CM | POA: Diagnosis not present

## 2019-09-04 DIAGNOSIS — T7840XA Allergy, unspecified, initial encounter: Secondary | ICD-10-CM

## 2019-09-04 MED ORDER — FLUTICASONE PROPIONATE 50 MCG/ACT NA SUSP: 2.0000 | Freq: Every day | NASAL | 0 refills | Status: DC

## 2019-09-04 MED ORDER — SYSTANE COMPLETE 0.6 % OP SOLN: 1.0000 [drp] | Freq: Two times a day (BID) | OPHTHALMIC | 0 refills | Status: DC

## 2019-09-04 MED ORDER — PREDNISONE 20 MG PO TABS: 40.0000 mg | ORAL_TABLET | Freq: Every day | ORAL | 0 refills | Status: DC

## 2019-09-04 MED ORDER — CETIRIZINE HCL 10 MG PO TABS: 10.0000 mg | ORAL_TABLET | Freq: Two times a day (BID) | ORAL | 1 refills | Status: DC

## 2019-09-04 NOTE — Addendum Note (Signed)
Addended by: Abigail Butts on: 09/04/2019 12:55 PM   Modules accepted: Orders

## 2019-09-04 NOTE — Progress Notes (Signed)
E visit for Allergic Rhinitis We are sorry that you are not feeling well.  Here is how we plan to help!   Based on what you have shared with me it looks like you are having an allergic reaction to cat hair causing nasal congestion, runny nose, sneezing, and itching.  Most types of rhinitis are caused by an inflammation and are associated with symptoms in the eyes ears or throat.  Allergic rhinitis and allergic reactions are caused when allergens trigger the release of histamine in the body.  Histamine causes itching, swelling, and fluid to build up in the fragile linings of the nasal passages, sinuses and eyelids.  An itchy nose and clear discharge are common.  I agree that I would not use hydrocortisone on your face and around your eyes. I will prescribe the following medications to help with your symptoms.  I recommend the following over the counter treatments: Zyrtec 10 mg twice daily x5 days  I also would recommend a nasal spray: Flonase 2 sprays into each nostril once daily  You may also benefit from eye drops such as: Systane 1-2 driops each eye twice daily as needed  HOME CARE:   You can use an over-the-counter saline nasal spray as needed  Avoid areas where there is heavy dust, mites, or molds  Stay indoors on windy days during the pollen season  Keep windows closed in home, at least in bedroom; use air conditioner.  Use high-efficiency house air filter  Keep windows closed in car, turn AC on re-circulate  Avoid playing out with dog during pollen season  GET HELP RIGHT AWAY IF:   If your symptoms do not improve within 10 days  You become short of breath  You develop yellow or green discharge from your nose for over 3 days  You have coughing fits  MAKE SURE YOU:   Understand these instructions  Will watch your condition  Will get help right away if you are not doing well or get worse  Thank you for choosing an e-visit. Your e-visit answers were reviewed  by a board certified advanced clinical practitioner to complete your personal care plan. Depending upon the condition, your plan could have included both over the counter or prescription medications. Please review your pharmacy choice. Be sure that the pharmacy you have chosen is open so that you can pick up your prescription now.  If there is a problem you may message your provider in [place] to have the prescription routed to another pharmacy. Your safety is important to [place]. If you have drug allergies check your prescription carefully.  For the next 24 hours, you can use MyChart to ask questions about today's visit, request a non-urgent call back, or ask for a work or school excuse from your e-visit provider. You will get an email in the next two days asking about your experience. I hope that your e-visit has been valuable and will speed your recovery.    Greater than 5 minutes, yet less than 10 minutes of time have been spent researching, coordinating, and implementing care for this patient today

## 2019-09-12 ENCOUNTER — Encounter: Payer: Self-pay | Admitting: Internal Medicine

## 2019-09-13 MED ORDER — ALBUTEROL SULFATE HFA 108 (90 BASE) MCG/ACT IN AERS: 2.0000 | INHALATION_SPRAY | Freq: Four times a day (QID) | RESPIRATORY_TRACT | 0 refills | Status: DC | PRN

## 2019-09-17 ENCOUNTER — Ambulatory Visit: Payer: Federal, State, Local not specified - PPO | Admitting: Psychology

## 2019-09-18 ENCOUNTER — Other Ambulatory Visit: Payer: Self-pay | Admitting: Internal Medicine

## 2019-09-18 NOTE — Telephone Encounter (Signed)
Last filled 08/20/2019... please advise

## 2019-09-19 MED ORDER — LORAZEPAM 0.5 MG PO TABS: 0.5000 mg | ORAL_TABLET | Freq: Every day | ORAL | 0 refills | Status: DC | PRN

## 2019-09-19 NOTE — Telephone Encounter (Signed)
This is a duplicate---previous request forwarded to [facility]

## 2019-09-27 ENCOUNTER — Encounter: Payer: Self-pay | Admitting: Internal Medicine

## 2019-09-27 MED ORDER — BUPROPION HCL ER (XL) 300 MG PO TB24: 300.0000 mg | ORAL_TABLET | Freq: Every day | ORAL | 0 refills | Status: DC

## 2019-10-12 ENCOUNTER — Other Ambulatory Visit: Payer: Self-pay | Admitting: Internal Medicine

## 2019-10-13 ENCOUNTER — Other Ambulatory Visit: Payer: Self-pay | Admitting: Internal Medicine

## 2019-10-15 MED ORDER — SUMATRIPTAN SUCCINATE 50 MG PO TABS: 50.0000 mg | ORAL_TABLET | ORAL | 5 refills | Status: DC | PRN

## 2019-10-15 NOTE — Telephone Encounter (Signed)
Last filled 07/23/2019...Marland Kitchen please advise

## 2019-10-18 ENCOUNTER — Other Ambulatory Visit: Payer: Self-pay | Admitting: Internal Medicine

## 2019-10-18 NOTE — Telephone Encounter (Signed)
Lt filled 09/19/2019... please advise

## 2019-11-20 ENCOUNTER — Other Ambulatory Visit: Payer: Self-pay | Admitting: Internal Medicine

## 2019-11-20 MED ORDER — BUPROPION HCL ER (XL) 300 MG PO TB24: 300.0000 mg | ORAL_TABLET | Freq: Every day | ORAL | 0 refills | Status: DC

## 2019-11-20 MED ORDER — LORAZEPAM 0.5 MG PO TABS: 0.5000 mg | ORAL_TABLET | Freq: Every day | ORAL | 0 refills | Status: DC | PRN

## 2019-11-20 NOTE — Telephone Encounter (Signed)
Last time Lorazepam was refilled on 10/18/19 #20 with 0 refill LOV 06/18/2019 Hospital follow up  No future appointments.

## 2019-12-05 ENCOUNTER — Telehealth: Payer: Federal, State, Local not specified - PPO | Admitting: Family

## 2019-12-05 DIAGNOSIS — M5440 Lumbago with sciatica, unspecified side: Secondary | ICD-10-CM | POA: Diagnosis not present

## 2019-12-05 MED ORDER — GABAPENTIN 300 MG PO CAPS: 300.0000 mg | ORAL_CAPSULE | Freq: Two times a day (BID) | ORAL | 0 refills | Status: DC

## 2019-12-05 MED ORDER — ETODOLAC 300 MG PO CAPS: 300.0000 mg | ORAL_CAPSULE | Freq: Three times a day (TID) | ORAL | 2 refills | Status: DC

## 2019-12-05 MED ORDER — BACLOFEN 10 MG PO TABS: 10.0000 mg | ORAL_TABLET | Freq: Three times a day (TID) | ORAL | 0 refills | Status: DC

## 2019-12-05 NOTE — Progress Notes (Signed)
We are sorry that you are not feeling well.  Here is how we plan to help!  Based on what you have shared with me it looks like you mostly have acute back pain.  Acute back pain is defined as musculoskeletal pain that can resolve in 1-3 weeks with conservative treatment.  I have prescribed Etodolac 300 mg take one by mouth twice a day non-steroid anti-inflammatory (NSAID) as well as Baclofen 10 mg every eight hours as needed which is a muscle relaxer and gabapentin 300 mg twice a day as needed for pain.   Some patients experience stomach irritation or in increased heartburn with anti-inflammatory drugs.  Please keep in mind that muscle relaxer's can cause fatigue and should not be taken while at work or driving.  Back pain is very common.  The pain often gets better over time.  The cause of back pain is usually not dangerous.  Most people can learn to manage their back pain on their own.  Home Care  Stay active.  Start with short walks on flat ground if you can.  Try to walk farther each day.  Do not sit, drive or stand in one place for more than 30 minutes.  Do not stay in bed.  Do not avoid exercise or work.  Activity can help your back heal faster.  Be careful when you bend or lift an object.  Bend at your knees, keep the object close to you, and do not twist.  Sleep on a firm mattress.  Lie on your side, and bend your knees.  If you lie on your back, put a pillow under your knees.  Only take medicines as told by your doctor.  Put ice on the injured area.  Put ice in a plastic bag  Place a towel between your skin and the bag  Leave the ice on for 15-20 minutes, 3-4 times a day for the first 2-3 days. 210 After that, you can switch between ice and heat packs.  Ask your doctor about back exercises or massage.  Avoid feeling anxious or stressed.  Find good ways to deal with stress, such as exercise.  Get Help Right Way If:  Your pain does not go away with rest or medicine.  Your  pain does not go away in 1 week.  You have new problems.  You do not feel well.  The pain spreads into your legs.  You cannot control when you poop (bowel movement) or pee (urinate)  You feel sick to your stomach (nauseous) or throw up (vomit)  You have belly (abdominal) pain.  You feel like you may pass out (faint).  If you develop a fever.  Make Sure you:  Understand these instructions.  Will watch your condition  Will get help right away if you are not doing well or get worse.  Your e-visit answers were reviewed by a board certified advanced clinical practitioner to complete your personal care plan.  Depending on the condition, your plan could have included both over the counter or prescription medications.  If there is a problem please reply  once you have received a response from your provider.  Your safety is important to [place].  If you have drug allergies check your prescription carefully.    You can use MyChart to ask questions about today's visit, request a non-urgent call back, or ask for a work or school excuse for 24 hours related to this e-Visit. If it has been greater than 24 hours  you will need to follow up with your provider, or enter a new e-Visit to address those concerns.  You will get an e-mail in the next two days asking about your experience.  I hope that your e-visit has been valuable and will speed your recovery. Thank you for using e-visits.  Approximately 5 minutes was spent documenting and reviewing patient's chart.

## 2019-12-07 ENCOUNTER — Encounter (HOSPITAL_COMMUNITY): Payer: Self-pay

## 2019-12-13 ENCOUNTER — Other Ambulatory Visit: Payer: Self-pay

## 2019-12-13 ENCOUNTER — Encounter: Payer: Self-pay | Admitting: Internal Medicine

## 2019-12-13 ENCOUNTER — Ambulatory Visit: Payer: Federal, State, Local not specified - PPO | Admitting: Internal Medicine

## 2019-12-13 VITALS — BP 126/80 | HR 78 | Temp 98.0°F | Wt 197.0 lb

## 2019-12-13 DIAGNOSIS — M5441 Lumbago with sciatica, right side: Secondary | ICD-10-CM

## 2019-12-13 DIAGNOSIS — R635 Abnormal weight gain: Secondary | ICD-10-CM

## 2019-12-13 DIAGNOSIS — M25511 Pain in right shoulder: Secondary | ICD-10-CM

## 2019-12-13 DIAGNOSIS — M5442 Lumbago with sciatica, left side: Secondary | ICD-10-CM | POA: Diagnosis not present

## 2019-12-13 DIAGNOSIS — G8929 Other chronic pain: Secondary | ICD-10-CM

## 2019-12-13 MED ORDER — METHOCARBAMOL 500 MG PO TABS: 500.0000 mg | ORAL_TABLET | Freq: Three times a day (TID) | ORAL | 0 refills | Status: DC | PRN

## 2019-12-13 MED ORDER — PREDNISONE 10 MG PO TABS: ORAL_TABLET | ORAL | 0 refills | Status: DC

## 2019-12-13 MED ORDER — DEXAMETHASONE SODIUM PHOSPHATE 10 MG/ML IJ SOLN
10.0000 mg | Freq: Once | INTRAMUSCULAR | Status: AC
  Administered 2019-12-13: 10 mg via INTRAMUSCULAR

## 2019-12-13 NOTE — Patient Instructions (Signed)

## 2019-12-13 NOTE — Progress Notes (Signed)
Subjective:    Patient ID: Linda [name], female    DOB: [birth year], 44 y.o.   MRN: [name]  HPI  Patient presents to the clinic today with complaint of low back pain.  She reports this started about 1-2 months ago but has gotten worse.  She describes the pain is intermittent, stabbing pain that radiates to her bilateral legs.  She denies numbness, tingling or weakness.  She denies urinary or vaginal complaints.  She denies any trauma to the area.  She has a history of sciatica and reports this feels the same.  She has tried a heating pad and seen a chiropractor with minimal relief.  She also reports right shoulder pain.  This only occurs when she is having back pain.  She describes the pain is.  The pain radiates into her neck.  She denies headaches, dizziness or visual changes.  She denies numbness, tingling or weakness of the right upper extremity.  She denies any injury to the area.  She has not taken any medications OTC for this.  She would also like to discuss getting put back on appetite suppressants.  She reports she has gained 40 pounds over the last year.  She has a prior history of the sleeve gastrectomy.  Review of Systems      Past Medical History:  Diagnosis Date  . Alcohol abuse   . Anxiety   . Asthma   . GERD (gastroesophageal reflux disease)   . Heart murmur    while pregnant  . Migraine     Current Outpatient Medications  Medication Sig Dispense Refill  . acetaminophen (TYLENOL) 500 MG tablet Take 500 mg by mouth every 6 (six) hours as needed for fever.    Marland Kitchen albuterol (VENTOLIN HFA) 108 (90 Base) MCG/ACT inhaler Inhale 2 puffs into the lungs every 6 (six) hours as needed for wheezing or shortness of breath. 18 g 0  . baclofen (LIORESAL) 10 MG tablet Take 1 tablet (10 mg total) by mouth 3 (three) times daily. 30 each 0  . buPROPion (WELLBUTRIN XL) 300 MG 24 hr tablet Take 1 tablet (300 mg total) by mouth daily. 90 tablet 0  . cetirizine (ZYRTEC ALLERGY) 10 MG  tablet Take 1 tablet (10 mg total) by mouth 2 (two) times daily. 10 tablet 1  . cyclobenzaprine (FLEXERIL) 5 MG tablet Take 1 tablet (5 mg total) by mouth at bedtime as needed for muscle spasms. 20 tablet 0  . etodolac (LODINE) 300 MG capsule Take 1 capsule (300 mg total) by mouth every 8 (eight) hours. 60 capsule 2  . gabapentin (NEURONTIN) 300 MG capsule Take 1 capsule (300 mg total) by mouth 2 (two) times daily. 60 capsule 0  . hydrOXYzine (ATARAX/VISTARIL) 10 MG tablet Take 10 mg by mouth 3 (three) times daily as needed for anxiety.    Marland Kitchen ketorolac (TORADOL) 10 MG tablet Take 1 tablet (10 mg total) by mouth every 8 (eight) hours as needed. 20 tablet 0  . LORazepam (ATIVAN) 0.5 MG tablet Take 1 tablet (0.5 mg total) by mouth daily as needed for anxiety. 20 tablet 0  . ondansetron (ZOFRAN ODT) 4 MG disintegrating tablet Take 1 tablet (4 mg total) by mouth every 8 (eight) hours as needed for nausea. 10 tablet 0  . Propylene Glycol (SYSTANE COMPLETE) 0.6 % SOLN Apply 1-2 drops to eye 2 (two) times daily. 5 mL 0  . SUMAtriptan (IMITREX) 50 MG tablet Take 1 tablet (50 mg total) by mouth every  2 (two) hours as needed for migraine. May repeat in 2 hours if headache persists or recurs. 10 tablet 5  . venlafaxine XR (EFFEXOR-XR) 37.5 MG 24 hr capsule TAKE 1 CAPSULE (37.5 MG TOTAL) BY MOUTH AT BEDTIME. 90 capsule 1  . Vitamin D, Ergocalciferol, (DRISDOL) 1.25 MG (50000 UT) CAPS capsule Take 1 capsule (50,000 Units total) by mouth every 7 (seven) days. 12 capsule 0   No current facility-administered medications for this visit.    Allergies  Allergen Reactions  . Bacitra-Neomycin-Polymyxin-Hc Other (See Comments)    Pain and swelling of ear canal    Family History  Problem Relation Age of Onset  . Hypertension Father   . Diabetes Father   . Hypertension Mother   . Cancer Mother        uterine  . Stroke Mother   . Diabetes Maternal Aunt   . Diabetes Maternal Uncle   . Cancer Maternal Grandmother         PANCREATIC  . Hypertension Maternal Grandmother   . Diabetes Maternal Grandmother   . Cancer Maternal Grandfather        LUNG  . Hypertension Maternal Grandfather   . Diabetes Maternal Grandfather   . Cancer Paternal Grandmother        THROAT  . Alzheimer's disease Paternal Grandmother     Social History   Socioeconomic History  . Marital status: Divorced    Spouse name: Not on file  . Number of children: Not on file  . Years of education: Not on file  . Highest education level: Not on file  Occupational History  . Not on file  Tobacco Use  . Smoking status: Never Smoker  . Smokeless tobacco: Never Used  Substance and Sexual Activity  . Alcohol use: Yes    Comment: occ  . Drug use: No  . Sexual activity: Not on file  Other Topics Concern  . Not on file  Social History Narrative  . Not on file   Social Determinants of Health   Financial Resource Strain:   . Difficulty of Paying Living Expenses:   Food Insecurity:   . Worried About Charity fundraiser in the Last Year:   . Arboriculturist in the Last Year:   Transportation Needs:   . Film/video editor (Medical):   Marland Kitchen Lack of Transportation (Non-Medical):   Physical Activity:   . Days of Exercise per Week:   . Minutes of Exercise per Session:   Stress:   . Feeling of Stress :   Social Connections:   . Frequency of Communication with Friends and Family:   . Frequency of Social Gatherings with Friends and Family:   . Attends Religious Services:   . Active Member of Clubs or Organizations:   . Attends Archivist Meetings:   Marland Kitchen Marital Status:   Intimate Partner Violence:   . Fear of Current or Ex-Partner:   . Emotionally Abused:   Marland Kitchen Physically Abused:   . Sexually Abused:      Constitutional: Patient reports weight gain.  Denies fever, malaise, fatigue, headache.  Respiratory: Denies difficulty breathing, shortness of breath, cough or sputum production.   Cardiovascular: Denies chest  pain, chest tightness, palpitations or swelling in the hands or feet.  Gastrointestinal: Denies abdominal pain, bloating, constipation, diarrhea or blood in the stool.  GU: Denies urgency, frequency, pain with urination, burning sensation, blood in urine, odor or discharge. Musculoskeletal: Patient reports low back pain, right shoulder pain.  Denies decrease in range of motion, difficulty with gait, or joint swelling.  Skin: Denies redness, rashes, lesions or ulcercations.  Neurological: Denies numbness, tingling, weakness or problems with balance and coordination.    No other specific complaints in a complete review of systems (except as listed in HPI above).   Objective:   Physical Exam  BP 126/80   Pulse 78   Temp 98 F (36.7 C) (Temporal)   Wt 197 lb (89.4 kg)   LMP 07/12/2011   SpO2 98%   BMI 29.95 kg/m   Wt Readings from Last 3 Encounters:  06/13/19 175 lb (79.4 kg)  05/17/19 183 lb (83 kg)  04/16/19 180 lb (81.6 kg)    General: Appears her stated age, well developed, well nourished in NAD. Skin: Warm, dry and intact. No rashes noted. Cardiovascular: Normal rate and rhythm. S1,S2 noted.  No murmur, rubs or gallops noted.  Radial and pedal pulses 2+ bilaterally Pulmonary/Chest: Normal effort and positive vesicular breath sounds. No respiratory distress. No wheezes, rales or ronchi noted.  Musculoskeletal: Normal flexion, extension and rotation of the cervical spine.  No bony tenderness noted over the lumbar spine.  Pain with palpation of bilateral paralumbar muscles.  Normal internal and external rotation of the right shoulder.  Drop can test on the right.  Strength 5/5 BUE/BLE no difficulty with gait.  Neurological: Alert and oriented.    BMET    Component Value Date/Time   NA 138 06/13/2019 2235   NA 137 05/31/2013 0846   K 3.9 06/13/2019 2235   K 3.8 05/31/2013 0846   CL 106 06/13/2019 2235   CL 106 05/31/2013 0846   CO2 22 06/13/2019 2235   CO2 26 05/31/2013  0846   GLUCOSE 90 06/13/2019 2235   GLUCOSE 93 05/31/2013 0846   BUN 14 06/13/2019 2235   BUN 10 05/31/2013 0846   CREATININE 0.74 06/13/2019 2235   CREATININE 0.76 05/31/2013 0846   CALCIUM 8.5 (L) 06/13/2019 2235   CALCIUM 8.2 (L) 05/31/2013 0846   GFRNONAA >60 06/13/2019 2235   GFRNONAA >60 05/31/2013 0846   GFRAA >60 06/13/2019 2235   GFRAA >60 05/31/2013 0846    Lipid Panel     Component Value Date/Time   CHOL 232 (H) 05/17/2019 1606   TRIG 176.0 (H) 05/17/2019 1606   HDL 102.60 05/17/2019 1606   CHOLHDL 2 05/17/2019 1606   VLDL 35.2 05/17/2019 1606   LDLCALC 94 05/17/2019 1606    CBC    Component Value Date/Time   WBC 7.3 06/13/2019 2235   RBC 4.04 06/13/2019 2235   HGB 12.7 06/13/2019 2235   HGB 13.3 05/31/2013 0846   HCT 38.3 06/13/2019 2235   HCT 39.2 05/31/2013 0846   PLT 245 06/13/2019 2235   PLT 219 05/31/2013 0846   MCV 94.8 06/13/2019 2235   MCV 88 05/31/2013 0846   MCH 31.4 06/13/2019 2235   MCHC 33.2 06/13/2019 2235   RDW 12.1 06/13/2019 2235   RDW 12.8 05/31/2013 0846   LYMPHSABS 2.1 06/13/2019 2235   LYMPHSABS 2.1 05/31/2013 0846   MONOABS 0.4 06/13/2019 2235   MONOABS 0.4 05/31/2013 0846   EOSABS 0.0 06/13/2019 2235   EOSABS 0.1 05/31/2013 0846   BASOSABS 0.1 06/13/2019 2235   BASOSABS 0.1 05/31/2013 0846    Hgb A1C Lab Results  Component Value Date   HGBA1C 6.1 09/02/2015            Assessment & Plan:  Chronic Low Back Pain with Sciatica:  She declines x-ray today Unable to take NSAIDs Decadron 10 mg IM x1 She declines Rx for Prednisone at this time. Rx for Methocarbamol 500 mg 3 times daily as needed-sedation caution given Encouraged ice instead of heat, daily stretching Hold off for methocarbamol 500 mg 3 times daily as needed-sedation caution given.  Seeing the chiropractor at this time, may consider patching  Acute Right Shoulder Pain:  She declines x-ray today Decadron 10 mg IM x1 Rx for Methocarbamol 500 mg 3  times daily as needed-sedation caution given  Abnormal Weight Gain:  Advised her her BMI is not high enough for appetite suppressants Encouraged diet and exercise for weight loss  Return precautions discussed Webb Silversmith, NP  This visit occurred during the SARS-CoV-2 public health emergency.  Safety protocols were in place, including screening questions prior to the visit, additional usage of staff PPE, and extensive cleaning of exam room while observing appropriate contact time as indicated for disinfecting solutions.

## 2019-12-13 NOTE — Addendum Note (Signed)
Addended by: Lindalou Hose Y on: 12/13/2019 05:00 PM   Modules accepted: Orders

## 2019-12-23 ENCOUNTER — Other Ambulatory Visit: Payer: Self-pay | Admitting: Internal Medicine

## 2019-12-24 MED ORDER — LORAZEPAM 0.5 MG PO TABS: 0.5000 mg | ORAL_TABLET | Freq: Every day | ORAL | 0 refills | Status: DC | PRN

## 2019-12-24 NOTE — Telephone Encounter (Signed)
Last filled 11/20/2019... please advise

## 2020-01-08 ENCOUNTER — Telehealth: Payer: Federal, State, Local not specified - PPO | Admitting: Emergency Medicine

## 2020-01-08 DIAGNOSIS — R112 Nausea with vomiting, unspecified: Secondary | ICD-10-CM

## 2020-01-08 MED ORDER — METOCLOPRAMIDE HCL 10 MG PO TABS
10.0000 mg | ORAL_TABLET | Freq: Three times a day (TID) | ORAL | 0 refills | Status: DC | PRN
Start: 2020-01-08 — End: 2020-02-28

## 2020-01-08 NOTE — Progress Notes (Signed)
We are sorry that you are not feeling well. Here is how we plan to help!  Based on what you have shared with me it looks like you have a Virus that is irritating your GI tract.  Vomiting is the forceful emptying of a portion of the stomach's content through the mouth.  Although nausea and vomiting can make you feel miserable, it's important to remember that these are not diseases, but rather symptoms of an underlying illness.  When we treat short term symptoms, we always caution that any symptoms that persist should be fully evaluated in a medical office.  I have prescribed a medication that will help alleviate your symptoms and allow you to stay hydrated:  Reglan 10mg  tablets to be taking once every 8 hours for vomiting.  Try taking this with 800mg  ibuprofen and 25mg  of Benadryl.  This can work wonders for migraines.  HOME CARE:  Drink clear liquids.  This is very important! Dehydration (the lack of fluid) can lead to a serious complication.  Start off with 1 tablespoon every 5 minutes for 8 hours.  You may begin eating bland foods after 8 hours without vomiting.  Start with saltine crackers, white bread, rice, mashed potatoes, applesauce.  After 48 hours on a bland diet, you may resume a normal diet.  Try to go to sleep.  Sleep often empties the stomach and relieves the need to vomit.  GET HELP RIGHT AWAY IF:   Your symptoms do not improve or worsen within 2 days after treatment.  You have a fever for over 3 days.  You cannot keep down fluids after trying the medication.  MAKE SURE YOU:   Understand these instructions.  Will watch your condition.  Will get help right away if you are not doing well or get worse.   Thank you for choosing an e-visit. Your e-visit answers were reviewed by a board certified advanced clinical practitioner to complete your personal care plan. Depending upon the condition, your plan could have included both over the counter or prescription  medications. Please review your pharmacy choice. Be sure that the pharmacy you have chosen is open so that you can pick up your prescription now.  If there is a problem you may message your provider in [place] to have the prescription routed to another pharmacy. Your safety is important to [place]. If you have drug allergies check your prescription carefully.  For the next 24 hours, you can use MyChart to ask questions about today's visit, request a non-urgent call back, or ask for a work or school excuse from your e-visit provider. You will get an e-mail in the next two days asking about your experience. I hope that your e-visit has been valuable and will speed your recovery.   Approximately 5 minutes was used in reviewing the patient's chart, questionnaire, prescribing medications, and documentation.

## 2020-01-15 ENCOUNTER — Other Ambulatory Visit: Payer: Self-pay | Admitting: Internal Medicine

## 2020-01-15 NOTE — Telephone Encounter (Signed)
Not do until next week

## 2020-01-22 ENCOUNTER — Other Ambulatory Visit: Payer: Self-pay | Admitting: Internal Medicine

## 2020-01-22 NOTE — Telephone Encounter (Signed)
Last filled 12/24/2019... please advise

## 2020-01-23 ENCOUNTER — Telehealth: Payer: Federal, State, Local not specified - PPO | Admitting: Physician Assistant

## 2020-01-23 ENCOUNTER — Other Ambulatory Visit: Payer: Self-pay | Admitting: Internal Medicine

## 2020-01-23 DIAGNOSIS — L309 Dermatitis, unspecified: Secondary | ICD-10-CM

## 2020-01-23 MED ORDER — LORAZEPAM 0.5 MG PO TABS: 0.5000 mg | ORAL_TABLET | Freq: Every day | ORAL | 0 refills | Status: DC | PRN

## 2020-01-23 MED ORDER — PREDNISONE 10 MG PO TABS
10.0000 mg | ORAL_TABLET | Freq: Every day | ORAL | 0 refills | Status: AC
Start: 2020-01-23 — End: 2020-01-28

## 2020-01-23 NOTE — Progress Notes (Signed)
E Visit for Rash  We are sorry that you are not feeling well. Here is how we plan to help!  Based on what you shared with me it looks like you have contact dermatitis.  Contact dermatitis is a skin rash caused by something that touches the skin and causes irritation or inflammation.  Your skin may be red, swollen, dry, cracked, and itch.  The rash should go away in a few days but can last a few weeks.  If you get a rash, it's important to figure out what caused it so the irritant can be avoided in the future. and I have prescribed Prednisone 10 mg daily for 5 days    Please avoid putting hydrocortisone or calamine on your face in case this is making it worse   HOME CARE:   Take cool showers and avoid direct sunlight.  Apply cool compress or wet dressings.  Take a bath in an oatmeal bath.  Sprinkle content of one Aveeno packet under running faucet with comfortably warm water.  Bathe for 15-20 minutes, 1-2 times daily.  Pat dry with a towel. Do not rub the rash.  Use hydrocortisone cream.  Take an antihistamine like Benadryl for widespread rashes that itch.  The adult dose of Benadryl is 25-50 mg by mouth 4 times daily.  Caution:  This type of medication may cause sleepiness.  Do not drink alcohol, drive, or operate dangerous machinery while taking antihistamines.  Do not take these medications if you have prostate enlargement.  Read package instructions thoroughly on all medications that you take.  GET HELP RIGHT AWAY IF:   Symptoms don't go away after treatment.  Severe itching that persists.  If you rash spreads or swells.  If you rash begins to smell.  If it blisters and opens or develops a yellow-brown crust.  You develop a fever.  You have a sore throat.  You become short of breath.  MAKE SURE YOU:  Understand these instructions. Will watch your condition. Will get help right away if you are not doing well or get worse.  Thank you for choosing an e-visit. Your  e-visit answers were reviewed by a board certified advanced clinical practitioner to complete your personal care plan. Depending upon the condition, your plan could have included both over the counter or prescription medications. Please review your pharmacy choice. Be sure that the pharmacy you have chosen is open so that you can pick up your prescription now.  If there is a problem you may message your provider in [place] to have the prescription routed to another pharmacy. Your safety is important to [place]. If you have drug allergies check your prescription carefully.  For the next 24 hours, you can use MyChart to ask questions about today's visit, request a non-urgent call back, or ask for a work or school excuse from your e-visit provider. You will get an email in the next two days asking about your experience. I hope that your e-visit has been valuable and will speed your recovery.    5 mins spent on chart

## 2020-01-30 ENCOUNTER — Inpatient Hospital Stay
Admission: RE | Admit: 2020-01-30 | Discharge: 2020-01-30 | Disposition: A | Discharge status: Home / Self Care | Payer: Federal, State, Local not specified - PPO | Source: Ambulatory Visit

## 2020-01-30 ENCOUNTER — Other Ambulatory Visit: Payer: Self-pay

## 2020-01-30 ENCOUNTER — Inpatient Hospital Stay: Admission: RE | Admit: 2020-01-30 | Payer: Federal, State, Local not specified - PPO | Source: Ambulatory Visit

## 2020-01-30 ENCOUNTER — Telehealth: Payer: Self-pay

## 2020-01-30 NOTE — Telephone Encounter (Signed)
Ok to be seen in office.  

## 2020-01-30 NOTE — Telephone Encounter (Signed)
Patient contacted the office and states that she has a rash on her face and neck. After speaking with Threasa Beards, it looks like she was seen for this virtually on 01/23/20 - and was given prednisone. She states this did not help her sx. She states she does have congestion/post nasal drip - but she states this is because her face, eyes, and nose are so itchy due to the rash, and she feels like this is making her nose runny and feel congested. I did advise I would need to speak with Rollene Fare, and see if it is okay for her to be seen in the office due to our COVID protocols.  I did place pt on schedule for Friday at the end of the day. I did advise this may need to be cancelled/adjusted due to her sx and provider preference, and she verbalized understanding.  Rollene Fare, is it ok if this patient is seen in the office? She denies any other COVID sx.

## 2020-02-01 ENCOUNTER — Ambulatory Visit: Payer: Federal, State, Local not specified - PPO | Admitting: Internal Medicine

## 2020-02-01 DIAGNOSIS — Z0289 Encounter for other administrative examinations: Secondary | ICD-10-CM

## 2020-02-01 NOTE — Progress Notes (Deleted)
Subjective:    Patient ID: Linda [name], female    DOB: [birth year], 44 y.o.   MRN: [id number]  HPI  Patient presents the clinic today with complaint of a rash on her face and neck.  She initially noticed this.  She did an ED visit for the same and was prescribed prednisone 10 mg daily for 5 days.  Review of Systems      Past Medical History:  Diagnosis Date   Alcohol abuse    Anxiety    Asthma    GERD (gastroesophageal reflux disease)    Heart murmur    while pregnant   Migraine     Current Outpatient Medications  Medication Sig Dispense Refill   acetaminophen (TYLENOL) 500 MG tablet Take 500 mg by mouth every 6 (six) hours as needed for fever.     albuterol (VENTOLIN HFA) 108 (90 Base) MCG/ACT inhaler Inhale 2 puffs into the lungs every 6 (six) hours as needed for wheezing or shortness of breath. 18 g 0   buPROPion (WELLBUTRIN XL) 300 MG 24 hr tablet Take 1 tablet (300 mg total) by mouth daily. 90 tablet 0   LORazepam (ATIVAN) 0.5 MG tablet Take 1 tablet (0.5 mg total) by mouth daily as needed for anxiety. 20 tablet 0   methocarbamol (ROBAXIN) 500 MG tablet Take 1 tablet (500 mg total) by mouth every 8 (eight) hours as needed for muscle spasms. 15 tablet 0   metoCLOPramide (REGLAN) 10 MG tablet Take 1 tablet (10 mg total) by mouth every 8 (eight) hours as needed for nausea. 5 tablet 0   SUMAtriptan (IMITREX) 50 MG tablet Take 1 tablet (50 mg total) by mouth every 2 (two) hours as needed for migraine. May repeat in 2 hours if headache persists or recurs. 10 tablet 5   No current facility-administered medications for this visit.    Allergies  Allergen Reactions   Bacitra-Neomycin-Polymyxin-Hc Other (See Comments)    Pain and swelling of ear canal   Nsaids Other (See Comments)    Family History  Problem Relation Age of Onset   Hypertension Father    Diabetes Father    Hypertension Mother    Cancer Mother        uterine   Stroke Mother     Diabetes Maternal Aunt    Diabetes Maternal Uncle    Cancer Maternal Grandmother        PANCREATIC   Hypertension Maternal Grandmother    Diabetes Maternal Grandmother    Cancer Maternal Grandfather        LUNG   Hypertension Maternal Grandfather    Diabetes Maternal Grandfather    Cancer Paternal Grandmother        THROAT   Alzheimer's disease Paternal Grandmother     Social History   Socioeconomic History   Marital status: Divorced    Spouse name: Not on file   Number of children: Not on file   Years of education: Not on file   Highest education level: Not on file  Occupational History   Not on file  Tobacco Use   Smoking status: Never Smoker   Smokeless tobacco: Never Used  Scientific laboratory technician Use: Never used  Substance and Sexual Activity   Alcohol use: Yes    Comment: occ   Drug use: No   Sexual activity: Not on file  Other Topics Concern   Not on file  Social History Narrative   Not on file   Social Determinants  of Health   Financial Resource Strain:    Difficulty of Paying Living Expenses:   Food Insecurity:    Worried About Charity fundraiser in the Last Year:    Arboriculturist in the Last Year:   Transportation Needs:    Film/video editor (Medical):    Lack of Transportation (Non-Medical):   Physical Activity:    Days of Exercise per Week:    Minutes of Exercise per Session:   Stress:    Feeling of Stress :   Social Connections:    Frequency of Communication with Friends and Family:    Frequency of Social Gatherings with Friends and Family:    Attends Religious Services:    Active Member of Clubs or Organizations:    Attends Music therapist:    Marital Status:   Intimate Partner Violence:    Fear of Current or Ex-Partner:    Emotionally Abused:    Physically Abused:    Sexually Abused:      Constitutional: Denies fever, malaise, fatigue, headache or abrupt weight changes.    HEENT: Denies eye pain, eye redness, ear pain, ringing in the ears, wax buildup, runny nose, nasal congestion, bloody nose, or sore throat. Respiratory: Denies difficulty breathing, shortness of breath, cough or sputum production.   Cardiovascular: Denies chest pain, chest tightness, palpitations or swelling in the hands or feet.  Gastrointestinal: Denies abdominal pain, bloating, constipation, diarrhea or blood in the stool.  GU: Denies urgency, frequency, pain with urination, burning sensation, blood in urine, odor or discharge. Musculoskeletal: Denies decrease in range of motion, difficulty with gait, muscle pain or joint pain and swelling.  Skin: Pt reports rash. Denies redness, lesions or ulcercations.  Neurological: Denies dizziness, difficulty with memory, difficulty with speech or problems with balance and coordination.  Psych: Denies anxiety, depression, SI/HI.  No other specific complaints in a complete review of systems (except as listed in HPI above).  Objective:   Physical Exam   LMP 07/12/2011  Wt Readings from Last 3 Encounters:  12/13/19 197 lb (89.4 kg)  06/13/19 175 lb (79.4 kg)  05/17/19 183 lb (83 kg)    General: Appears their stated age, well developed, well nourished in NAD. Skin: Warm, dry and intact. No rashes, lesions or ulcerations noted. HEENT: Head: normal shape and size; Eyes: sclera white, no icterus, conjunctiva pink, PERRLA and EOMs intact; Ears: Tm's gray and intact, normal light reflex; Nose: mucosa pink and moist, septum midline; Throat/Mouth: Teeth present, mucosa pink and moist, no exudate, lesions or ulcerations noted.  Neck:  Neck supple, trachea midline. No masses, lumps or thyromegaly present.  Cardiovascular: Normal rate and rhythm. S1,S2 noted.  No murmur, rubs or gallops noted. No JVD or BLE edema. No carotid bruits noted. Pulmonary/Chest: Normal effort and positive vesicular breath sounds. No respiratory distress. No wheezes, rales or ronchi  noted.  Abdomen: Soft and nontender. Normal bowel sounds. No distention or masses noted. Liver, spleen and kidneys non palpable. Musculoskeletal: Normal range of motion. No signs of joint swelling. No difficulty with gait.  Neurological: Alert and oriented. Cranial nerves II-XII grossly intact. Coordination normal.  Psychiatric: Mood and affect normal. Behavior is normal. Judgment and thought content normal.   EKG:  BMET    Component Value Date/Time   NA 138 06/13/2019 2235   NA 137 05/31/2013 0846   K 3.9 06/13/2019 2235   K 3.8 05/31/2013 0846   CL 106 06/13/2019 2235   CL 106 05/31/2013  0846   CO2 22 06/13/2019 2235   CO2 26 05/31/2013 0846   GLUCOSE 90 06/13/2019 2235   GLUCOSE 93 05/31/2013 0846   BUN 14 06/13/2019 2235   BUN 10 05/31/2013 0846   CREATININE 0.74 06/13/2019 2235   CREATININE 0.76 05/31/2013 0846   CALCIUM 8.5 (L) 06/13/2019 2235   CALCIUM 8.2 (L) 05/31/2013 0846   GFRNONAA >60 06/13/2019 2235   GFRNONAA >60 05/31/2013 0846   GFRAA >60 06/13/2019 2235   GFRAA >60 05/31/2013 0846    Lipid Panel     Component Value Date/Time   CHOL 232 (H) 05/17/2019 1606   TRIG 176.0 (H) 05/17/2019 1606   HDL 102.60 05/17/2019 1606   CHOLHDL 2 05/17/2019 1606   VLDL 35.2 05/17/2019 1606   LDLCALC 94 05/17/2019 1606    CBC    Component Value Date/Time   WBC 7.3 06/13/2019 2235   RBC 4.04 06/13/2019 2235   HGB 12.7 06/13/2019 2235   HGB 13.3 05/31/2013 0846   HCT 38.3 06/13/2019 2235   HCT 39.2 05/31/2013 0846   PLT 245 06/13/2019 2235   PLT 219 05/31/2013 0846   MCV 94.8 06/13/2019 2235   MCV 88 05/31/2013 0846   MCH 31.4 06/13/2019 2235   MCHC 33.2 06/13/2019 2235   RDW 12.1 06/13/2019 2235   RDW 12.8 05/31/2013 0846   LYMPHSABS 2.1 06/13/2019 2235   LYMPHSABS 2.1 05/31/2013 0846   MONOABS 0.4 06/13/2019 2235   MONOABS 0.4 05/31/2013 0846   EOSABS 0.0 06/13/2019 2235   EOSABS 0.1 05/31/2013 0846   BASOSABS 0.1 06/13/2019 2235   BASOSABS 0.1  05/31/2013 0846    Hgb A1C Lab Results  Component Value Date   HGBA1C 6.1 09/02/2015          Assessment & Plan:   Webb Silversmith, NP This visit occurred during the SARS-CoV-2 public health emergency.  Safety protocols were in place, including screening questions prior to the visit, additional usage of staff PPE, and extensive cleaning of exam room while observing appropriate contact time as indicated for disinfecting solutions.

## 2020-02-07 ENCOUNTER — Telehealth: Payer: Federal, State, Local not specified - PPO | Admitting: Nurse Practitioner

## 2020-02-07 DIAGNOSIS — R079 Chest pain, unspecified: Secondary | ICD-10-CM | POA: Diagnosis not present

## 2020-02-07 DIAGNOSIS — Z20828 Contact with and (suspected) exposure to other viral communicable diseases: Secondary | ICD-10-CM | POA: Diagnosis not present

## 2020-02-07 DIAGNOSIS — G4489 Other headache syndrome: Secondary | ICD-10-CM | POA: Diagnosis not present

## 2020-02-07 DIAGNOSIS — B373 Candidiasis of vulva and vagina: Secondary | ICD-10-CM | POA: Diagnosis not present

## 2020-02-07 DIAGNOSIS — B3731 Acute candidiasis of vulva and vagina: Secondary | ICD-10-CM

## 2020-02-07 DIAGNOSIS — R0789 Other chest pain: Secondary | ICD-10-CM | POA: Diagnosis not present

## 2020-02-07 MED ORDER — FLUCONAZOLE 150 MG PO TABS
150.0000 mg | ORAL_TABLET | Freq: Once | ORAL | 0 refills | Status: AC
Start: 2020-02-07 — End: 2020-02-07

## 2020-02-07 NOTE — Progress Notes (Signed)

## 2020-02-08 ENCOUNTER — Other Ambulatory Visit: Payer: Self-pay

## 2020-02-08 ENCOUNTER — Emergency Department (HOSPITAL_BASED_OUTPATIENT_CLINIC_OR_DEPARTMENT_OTHER): Payer: Federal, State, Local not specified - PPO

## 2020-02-08 ENCOUNTER — Emergency Department (HOSPITAL_BASED_OUTPATIENT_CLINIC_OR_DEPARTMENT_OTHER)
Admission: EM | Admit: 2020-02-08 | Discharge: 2020-02-08 | Discharge status: Against Medical Advice | Payer: Federal, State, Local not specified - PPO | Attending: Emergency Medicine | Admitting: Emergency Medicine

## 2020-02-08 ENCOUNTER — Encounter (HOSPITAL_BASED_OUTPATIENT_CLINIC_OR_DEPARTMENT_OTHER): Payer: Self-pay | Admitting: Emergency Medicine

## 2020-02-08 DIAGNOSIS — F419 Anxiety disorder, unspecified: Secondary | ICD-10-CM | POA: Diagnosis not present

## 2020-02-08 DIAGNOSIS — R002 Palpitations: Secondary | ICD-10-CM | POA: Diagnosis not present

## 2020-02-08 DIAGNOSIS — R059 Cough, unspecified: Secondary | ICD-10-CM

## 2020-02-08 DIAGNOSIS — R600 Localized edema: Secondary | ICD-10-CM | POA: Insufficient documentation

## 2020-02-08 DIAGNOSIS — R6883 Chills (without fever): Secondary | ICD-10-CM | POA: Diagnosis not present

## 2020-02-08 DIAGNOSIS — M79604 Pain in right leg: Secondary | ICD-10-CM | POA: Diagnosis not present

## 2020-02-08 DIAGNOSIS — J45909 Unspecified asthma, uncomplicated: Secondary | ICD-10-CM | POA: Diagnosis not present

## 2020-02-08 DIAGNOSIS — R5383 Other fatigue: Secondary | ICD-10-CM

## 2020-02-08 DIAGNOSIS — M7989 Other specified soft tissue disorders: Secondary | ICD-10-CM | POA: Diagnosis not present

## 2020-02-08 DIAGNOSIS — M79605 Pain in left leg: Secondary | ICD-10-CM | POA: Diagnosis not present

## 2020-02-08 DIAGNOSIS — R072 Precordial pain: Secondary | ICD-10-CM | POA: Diagnosis not present

## 2020-02-08 DIAGNOSIS — R05 Cough: Secondary | ICD-10-CM | POA: Diagnosis not present

## 2020-02-08 DIAGNOSIS — R079 Chest pain, unspecified: Secondary | ICD-10-CM | POA: Diagnosis not present

## 2020-02-08 LAB — URINALYSIS, ROUTINE W REFLEX MICROSCOPIC
Bilirubin Urine: NEGATIVE
Glucose, UA: NEGATIVE mg/dL
Hgb urine dipstick: NEGATIVE
Ketones, ur: NEGATIVE mg/dL
Nitrite: NEGATIVE
Protein, ur: NEGATIVE mg/dL
Specific Gravity, Urine: 1.015 (ref 1.005–1.030)
pH: 8 (ref 5.0–8.0)

## 2020-02-08 LAB — CBC
HCT: 38.5 % (ref 36.0–46.0)
Hemoglobin: 12.6 g/dL (ref 12.0–15.0)
MCH: 30 pg (ref 26.0–34.0)
MCHC: 32.7 g/dL (ref 30.0–36.0)
MCV: 91.7 fL (ref 80.0–100.0)
Platelets: 268 10*3/uL (ref 150–400)
RBC: 4.2 MIL/uL (ref 3.87–5.11)
RDW: 12.6 % (ref 11.5–15.5)
WBC: 7.9 10*3/uL (ref 4.0–10.5)
nRBC: 0 % (ref 0.0–0.2)

## 2020-02-08 LAB — URINALYSIS, MICROSCOPIC (REFLEX)

## 2020-02-08 LAB — BASIC METABOLIC PANEL
Anion gap: 12 (ref 5–15)
BUN: 9 mg/dL (ref 6–20)
CO2: 23 mmol/L (ref 22–32)
Calcium: 9 mg/dL (ref 8.9–10.3)
Chloride: 101 mmol/L (ref 98–111)
Creatinine, Ser: 0.79 mg/dL (ref 0.44–1.00)
GFR calc Af Amer: >60 mL/min (ref >60)
GFR calc non Af Amer: >60 mL/min (ref >60)
Glucose, Bld: 99 mg/dL (ref 70–99)
Potassium: 3.6 mmol/L (ref 3.5–5.1)
Sodium: 136 mmol/L (ref 135–145)

## 2020-02-08 LAB — D-DIMER, QUANTITATIVE: D-Dimer, Quant: <0.27 ug/mL-FEU (ref 0.00–0.50)

## 2020-02-08 LAB — MAGNESIUM: Magnesium: 2.1 mg/dL (ref 1.7–2.4)

## 2020-02-08 LAB — TROPONIN I (HIGH SENSITIVITY): Troponin I (High Sensitivity): <2 ng/L (ref <18)

## 2020-02-08 LAB — PROTIME-INR
INR: 1 (ref 0.8–1.2)
Prothrombin Time: 12.4 seconds (ref 11.4–15.2)

## 2020-02-08 LAB — CK: Total CK: 139 U/L (ref 38–234)

## 2020-02-08 NOTE — ED Notes (Signed)
Pt reports right thigh pain, denies chest pain at this time. VS WNL.  Awaiting u/s

## 2020-02-08 NOTE — ED Notes (Signed)
ED Provider at bedside. 

## 2020-02-08 NOTE — ED Triage Notes (Addendum)
Arrives tonight with chest pain, HTN 146/95 at home with no hx of same. HA and dizziness. States she feels a knot in her right leg onset tonight.

## 2020-02-08 NOTE — ED Notes (Signed)
Pt not in room ?left before discharge complete. MD made aware.

## 2020-02-08 NOTE — ED Provider Notes (Signed)
[facility] EMERGENCY DEPARTMENT Provider Note   CSN: [id number] Arrival date & time: 02/08/20  0543     History Chief Complaint  Patient presents with  . Chest Pain    Linda [name] is a 44 y.o. female.  The history is provided by the patient and medical records. No language interpreter was used.  Chest Pain Pain location:  Substernal area Pain quality: sharp and stabbing   Pain radiates to:  Does not radiate Pain severity:  Moderate Onset quality:  Sudden Duration:  2 days Timing:  Intermittent Progression:  Waxing and waning Chronicity:  New Relieved by:  Nothing Worsened by:  Nothing Ineffective treatments:  None tried Associated symptoms: anxiety, cough, fatigue, headache, lower extremity edema and palpitations   Associated symptoms: no abdominal pain, no back pain, no diaphoresis, no dizziness, no fever (chills), no nausea, no numbness, no shortness of breath, no vomiting and no weakness   Risk factors: not female        Past Medical History:  Diagnosis Date  . Alcohol abuse   . Anxiety   . Asthma   . GERD (gastroesophageal reflux disease)   . Heart murmur    while pregnant  . Migraine     Patient Active Problem List   Diagnosis Date Noted  . Complicated migraine 29/51/8841  . GERD (gastroesophageal reflux disease) 01/03/2017  . Seasonal allergies 01/03/2017  . Anxiety and depression 04/15/2014    Past Surgical History:  Procedure Laterality Date  . ABDOMINAL HYSTERECTOMY  12/20012   Total  . ENDOMETRIAL ABLATION  2012  . GASTRIC ROUX-EN-Y N/A 05/18/2016   Procedure: LAPAROSCOPIC ROUX-EN-Y GASTRIC WITH UPPER ENDO;  Surgeon: Arta Bruce Kinsinger, MD;  Location: [facility];  Service: General;  Laterality: N/A;  . MYRINGOTOMY WITH TUBE PLACEMENT     X 3 SINCE ORRIGINALLY PLACE IN 2010  . TONSILECTOMY, ADENOIDECTOMY, BILATERAL MYRINGOTOMY AND TUBES  2010  . TONSILLECTOMY    . TUBAL LIGATION  08/2010  . UPPER GI ENDOSCOPY  05/18/2016    Procedure: UPPER GI ENDOSCOPY;  Surgeon: Arta Bruce Kinsinger, MD;  Location: [facility];  Service: General;;     OB History   No obstetric history on file.     Family History  Problem Relation Age of Onset  . Hypertension Father   . Diabetes Father   . Hypertension Mother   . Cancer Mother        uterine  . Stroke Mother   . Diabetes Maternal Aunt   . Diabetes Maternal Uncle   . Cancer Maternal Grandmother        PANCREATIC  . Hypertension Maternal Grandmother   . Diabetes Maternal Grandmother   . Cancer Maternal Grandfather        LUNG  . Hypertension Maternal Grandfather   . Diabetes Maternal Grandfather   . Cancer Paternal Grandmother        THROAT  . Alzheimer's disease Paternal Grandmother     Social History   Tobacco Use  . Smoking status: Never Smoker  . Smokeless tobacco: Never Used  Vaping Use  . Vaping Use: Never used  Substance Use Topics  . Alcohol use: Yes    Comment: occ  . Drug use: No    Home Medications Prior to Admission medications   Medication Sig Start Date End Date Taking? Authorizing Provider  acetaminophen (TYLENOL) 500 MG tablet Take 500 mg by mouth every 6 (six) hours as needed for fever.    [provider]  albuterol (VENTOLIN HFA) 108 (90 Base) MCG/ACT inhaler Inhale 2 puffs into the lungs every 6 (six) hours as needed for wheezing or shortness of breath. 09/13/19   Jearld Fenton, NP  buPROPion (WELLBUTRIN XL) 300 MG 24 hr tablet Take 1 tablet (300 mg total) by mouth daily. 11/20/19   Jearld Fenton, NP  LORazepam (ATIVAN) 0.5 MG tablet Take 1 tablet (0.5 mg total) by mouth daily as needed for anxiety. 01/23/20   Jearld Fenton, NP  methocarbamol (ROBAXIN) 500 MG tablet Take 1 tablet (500 mg total) by mouth every 8 (eight) hours as needed for muscle spasms. 12/13/19   Jearld Fenton, NP  metoCLOPramide (REGLAN) 10 MG tablet Take 1 tablet (10 mg total) by mouth every 8 (eight) hours as needed for nausea. 01/08/20   Montine Circle,  PA-C  SUMAtriptan (IMITREX) 50 MG tablet Take 1 tablet (50 mg total) by mouth every 2 (two) hours as needed for migraine. May repeat in 2 hours if headache persists or recurs. 10/15/19   Jearld Fenton, NP    Allergies    Bacitra-neomycin-polymyxin-hc and Nsaids  Review of Systems   Review of Systems  Constitutional: Positive for chills and fatigue. Negative for diaphoresis and fever (chills).  HENT: Negative for congestion.   Eyes: Negative for visual disturbance.  Respiratory: Positive for cough. Negative for chest tightness, shortness of breath, wheezing and stridor.   Cardiovascular: Positive for chest pain, palpitations and leg swelling (resolved).  Gastrointestinal: Negative for abdominal pain, constipation, diarrhea, nausea and vomiting.  Genitourinary: Negative for dysuria and flank pain.  Musculoskeletal: Negative for back pain, neck pain and neck stiffness.  Skin: Positive for color change (bruising). Negative for rash and wound.  Neurological: Positive for headaches. Negative for dizziness, weakness, light-headedness and numbness.  Psychiatric/Behavioral: Negative for agitation.  All other systems reviewed and are negative.   Physical Exam Updated Vital Signs BP (!) 146/95 (BP Location: Left Arm)   Pulse 64   Temp 98.8 F (37.1 C) (Oral)   Resp 18   LMP 07/12/2011   SpO2 100%   Physical Exam Vitals and nursing note reviewed.  Constitutional:      General: She is not in acute distress.    Appearance: She is well-developed. She is not ill-appearing, toxic-appearing or diaphoretic.  HENT:     Head: Normocephalic and atraumatic.     Right Ear: External ear normal.     Left Ear: External ear normal.     Nose: Nose normal.     Mouth/Throat:     Pharynx: No oropharyngeal exudate.  Eyes:     Conjunctiva/sclera: Conjunctivae normal.     Pupils: Pupils are equal, round, and reactive to light.  Cardiovascular:     Rate and Rhythm: Normal rate and regular rhythm.      Heart sounds: Normal heart sounds.  Pulmonary:     Effort: No respiratory distress.     Breath sounds: No stridor. No decreased breath sounds, wheezing, rhonchi or rales.  Chest:     Chest wall: No tenderness.  Abdominal:     General: There is no distension.     Palpations: Abdomen is soft.     Tenderness: There is no abdominal tenderness. There is no rebound.  Musculoskeletal:     Cervical back: Normal range of motion and neck supple.     Right lower leg: Tenderness present. No edema.     Left lower leg: No tenderness. No edema.  Skin:  General: Skin is warm.     Capillary Refill: Capillary refill takes less than 2 seconds.     Findings: No erythema or rash.  Neurological:     Mental Status: She is alert and oriented to person, place, and time.     Motor: No abnormal muscle tone.     Coordination: Coordination normal.     Deep Tendon Reflexes: Reflexes are normal and symmetric.  Psychiatric:        Mood and Affect: Mood is anxious.     ED Results / Procedures / Treatments   Labs (all labs ordered are listed, but only abnormal results are displayed) Labs Reviewed  URINALYSIS, ROUTINE W REFLEX MICROSCOPIC - Abnormal; Notable for the following components:      Result Value   Leukocytes,Ua TRACE (*)    All other components within normal limits  URINALYSIS, MICROSCOPIC (REFLEX) - Abnormal; Notable for the following components:   Bacteria, UA FEW (*)    All other components within normal limits  URINE CULTURE  BASIC METABOLIC PANEL  CBC  D-DIMER, QUANTITATIVE (NOT AT [facility])  CK  MAGNESIUM  PROTIME-INR  TROPONIN I (HIGH SENSITIVITY)    EKG EKG Interpretation  Date/Time:  Friday February 08 2020 05:47:52 EDT Ventricular Rate:  75 PR Interval:  130 QRS Duration: 84 QT Interval:  392 QTC Calculation: 437 R Axis:   18 Text Interpretation: Normal sinus rhythm Possible Anterior infarct , age undetermined Abnormal ECG When compared to prior, similar appearance. No STEMI  Confirmed by Antony Blackbird [phone]) on 02/08/2020 7:06:30 AM   Radiology DG Chest 2 View  Result Date: 02/08/2020 CLINICAL DATA:  Chest pain. EXAM: CHEST - 2 VIEW COMPARISON:  Chest x-ray 10/21/2018. FINDINGS: Mediastinum hilar structures normal. Lungs are clear. No pleural effusion or pneumothorax. No acute bony abnormality. Chest is stable from prior exam. IMPRESSION: No acute cardiopulmonary disease.  Stable chest from prior exam. Electronically Signed   By: Marcello Moores  Register   On: 02/08/2020 06:15   US Venous Img Lower Bilateral  Result Date: 02/08/2020 CLINICAL DATA:  Recent air travel, extremity pain and swelling EXAM: BILATERAL LOWER EXTREMITY VENOUS DOPPLER ULTRASOUND TECHNIQUE: Gray-scale sonography with graded compression, as well as color Doppler and duplex ultrasound were performed to evaluate the lower extremity deep venous systems from the level of the common femoral vein and including the common femoral, femoral, profunda femoral, popliteal and calf veins including the posterior tibial, peroneal and gastrocnemius veins when visible. The superficial great saphenous vein was also interrogated. Spectral Doppler was utilized to evaluate flow at rest and with distal augmentation maneuvers in the common femoral, femoral and popliteal veins. COMPARISON:  None. FINDINGS: RIGHT LOWER EXTREMITY Common Femoral Vein: No evidence of thrombus. Normal compressibility, respiratory phasicity and response to augmentation. Saphenofemoral Junction: No evidence of thrombus. Normal compressibility and flow on color Doppler imaging. Profunda Femoral Vein: No evidence of thrombus. Normal compressibility and flow on color Doppler imaging. Femoral Vein: No evidence of thrombus. Normal compressibility, respiratory phasicity and response to augmentation. Popliteal Vein: No evidence of thrombus. Normal compressibility, respiratory phasicity and response to augmentation. Calf Veins: No evidence of thrombus. Normal  compressibility and flow on color Doppler imaging. LEFT LOWER EXTREMITY Common Femoral Vein: No evidence of thrombus. Normal compressibility, respiratory phasicity and response to augmentation. Saphenofemoral Junction: No evidence of thrombus. Normal compressibility and flow on color Doppler imaging. Profunda Femoral Vein: No evidence of thrombus. Normal compressibility and flow on color Doppler imaging. Femoral Vein: No evidence of thrombus.  Normal compressibility, respiratory phasicity and response to augmentation. Popliteal Vein: No evidence of thrombus. Normal compressibility, respiratory phasicity and response to augmentation. Calf Veins: No evidence of thrombus. Normal compressibility and flow on color Doppler imaging. IMPRESSION: No evidence of deep venous thrombosis in either lower extremity. Electronically Signed   By: Jerilynn Mages.  Shick M.D.   On: 02/08/2020 09:29    Procedures Procedures (including critical care time)  Medications Ordered in ED Medications - No data to display  ED Course  I have reviewed the triage vital signs and the nursing notes.  Pertinent labs & imaging results that were available during my care of the patient were reviewed by me and considered in my medical decision making (see chart for details).    MDM Rules/Calculators/A&P                          Laelynn LEONE MOBLEY is a 44 y.o. female with a past medical history significant for migraines, asthma, GERD, and prior gastric bypass surgery who presents with several complaints including subjective chills, nonproductive cough, right leg pain and swelling, mild left leg pain, unexpected bruising, intermittent epistaxis, severe fatigue, and chest pain.  Patient reports that she recently had a flight to [place] and got back 2 days ago.  She reports that for the last 2 days she has been having the symptoms worsening with the fatigue, chills, cough, and the leg pain and swelling.  She reports has had unexpected bruising in her legs  for the last month but does not know what it is from.  She reports last several days she has had intermittent nosebleeds.  She reports that the chest pain comes and goes but is very sharp.  It is not persistent.  She does not feel it is pleuritic or exertional.  She is suspecting it is more anxiety related however she would like to be assessed to make sure is nothing more concerning.  She does note that she got tested for COVID-19 this morning and was negative at [facility].  She reports he has been fully vaccinated and had Covid last year herself.  She denies other complaints on arrival.  She reports her chest discomfort when she was feeling it was a 7 out of 10 in severity.  She is currently chest pain-free.  On exam, lungs are clear and chest is not tender.  I did not appreciate a murmur.  Abdomen is nontender.  Normal bowel sounds.  Good pulses in all extremities.  Patient had very faint bruising in her right lateral thigh and right calf.  She was very tender to palpation in these locations.  No rash to suggest shingles at this time.  Back was nontender.  Normal sensation and strength in lower extremities.  Good pulses present.  No back tenderness.  No focal neurologic deficits.  Patient well-appearing.  EKG did not show STEMI.  Given her leg pain and swelling as well as the new chest discomfort after a plane flight, will get a D-dimer and an ultrasound.  We will also get a CK to rule out rhabdo given the muscle spasms and knotting that appeared to be felt on exam.  Due to her subjective chills and fatigue we will get a urinalysis and other screening labs.  Anticipate reassessment after work-up.  Patient's work-up returned overall reassuring with no evidence of urinary tract infection with no nitrites.  Metabolic panel reassuring.  Troponin negative and CK nonelevated.  D-dimer is negative,  doubt PE or DVT.  Ultrasound also showed no DVT.  Her platelets and hemoglobin were normal.  INR is normal.  Unsure why  she was having the bruising she complained of.  Chest x-ray unchanged and reassuring.  Given her reassuring work-up, suspect it was more anxiety as patient was suspecting.  She will follow up with her PCP and return if any symptoms change or worsen.  She left for is able to discuss all of her findings however she may look them on my chart.  Patient eloped before I could discuss the results.  Patient eloped in stable condition with reassuring work-up.   Final Clinical Impression(s) / ED Diagnoses Final diagnoses:  Precordial pain  Anxiety  Cough  Fatigue, unspecified type  Chills    Rx / DC Orders ED Discharge Orders    None      Clinical Impression: 1. Precordial pain   2. Anxiety   3. Cough   4. Fatigue, unspecified type   5. Chills     Disposition: Eloped  Condition: Good  I have discussed the results, Dx and Tx plan with the pt(& family if present). He/she/they expressed understanding and agree(s) with the plan. Discharge instructions discussed at great length. Strict return precautions discussed and pt &/or family have verbalized understanding of the instructions. No further questions at time of discharge.    Discharge Medication List as of 02/08/2020 12:20 PM      Follow Up: No follow-up provider specified.    Salayah Meares, Gwenyth Allegra, MD 02/08/20 1224

## 2020-02-09 LAB — URINE CULTURE: Culture: NO GROWTH

## 2020-02-14 ENCOUNTER — Telehealth: Payer: Federal, State, Local not specified - PPO | Admitting: Emergency Medicine

## 2020-02-14 DIAGNOSIS — M549 Dorsalgia, unspecified: Secondary | ICD-10-CM | POA: Diagnosis not present

## 2020-02-14 DIAGNOSIS — G8929 Other chronic pain: Secondary | ICD-10-CM

## 2020-02-14 MED ORDER — CYCLOBENZAPRINE HCL 10 MG PO TABS
10.0000 mg | ORAL_TABLET | Freq: Three times a day (TID) | ORAL | 0 refills | Status: DC | PRN
Start: 2020-02-14 — End: 2020-04-30

## 2020-02-14 NOTE — Progress Notes (Signed)
We are sorry that you are not feeling well.  Here is how we plan to help!  Given the ongoing and intermittent nature of your symptoms, you should probably be seen by a neurosurgery group.  When physical therapy fails, that is usually the point at which surgery is recommended.    I can offer a muscle relaxer for a few days, but otherwise, I'm afraid there isn't much that we will be able do through e-visits.   Acute back pain is defined as musculoskeletal pain that can resolve in 1-3 weeks with conservative treatment.  I have prescribed Flexeril 10 mg every eight hours as needed which is a muscle relaxer  Some patients experience stomach irritation or in increased heartburn with anti-inflammatory drugs.  Please keep in mind that muscle relaxer's can cause fatigue and should not be taken while at work or driving.  Back pain is very common.  The pain often gets better over time.  The cause of back pain is usually not dangerous.  Most people can learn to manage their back pain on their own.  Home Care  Stay active.  Start with short walks on flat ground if you can.  Try to walk farther each day.  Do not sit, drive or stand in one place for more than 30 minutes.  Do not stay in bed.  Do not avoid exercise or work.  Activity can help your back heal faster.  Be careful when you bend or lift an object.  Bend at your knees, keep the object close to you, and do not twist.  Sleep on a firm mattress.  Lie on your side, and bend your knees.  If you lie on your back, put a pillow under your knees.  Only take medicines as told by your doctor.  Put ice on the injured area.  Put ice in a plastic bag  Place a towel between your skin and the bag  Leave the ice on for 15-20 minutes, 3-4 times a day for the first 2-3 days. 210 After that, you can switch between ice and heat packs.  Ask your doctor about back exercises or massage.  Avoid feeling anxious or stressed.  Find good ways to deal with stress,  such as exercise.  Get Help Right Way If:  Your pain does not go away with rest or medicine.  Your pain does not go away in 1 week.  You have new problems.  You do not feel well.  The pain spreads into your legs.  You cannot control when you poop (bowel movement) or pee (urinate)  You feel sick to your stomach (nauseous) or throw up (vomit)  You have belly (abdominal) pain.  You feel like you may pass out (faint).  If you develop a fever.  Make Sure you:  Understand these instructions.  Will watch your condition  Will get help right away if you are not doing well or get worse.  Your e-visit answers were reviewed by a board certified advanced clinical practitioner to complete your personal care plan.  Depending on the condition, your plan could have included both over the counter or prescription medications.  If there is a problem please reply  once you have received a response from your provider.  Your safety is important to [place].  If you have drug allergies check your prescription carefully.    You can use MyChart to ask questions about today's visit, request a non-urgent call back, or ask for a work or  school excuse for 24 hours related to this e-Visit. If it has been greater than 24 hours you will need to follow up with your provider, or enter a new e-Visit to address those concerns.  You will get an e-mail in the next two days asking about your experience.  I hope that your e-visit has been valuable and will speed your recovery. Thank you for using e-visits.  Approximately 5 minutes was used in reviewing the patient's chart, questionnaire, prescribing medications, and documentation.

## 2020-02-15 ENCOUNTER — Telehealth: Payer: Federal, State, Local not specified - PPO | Admitting: Family

## 2020-02-15 DIAGNOSIS — N898 Other specified noninflammatory disorders of vagina: Secondary | ICD-10-CM

## 2020-02-15 MED ORDER — FLUCONAZOLE 150 MG PO TABS: 150.0000 mg | ORAL_TABLET | ORAL | 0 refills | Status: DC

## 2020-02-15 NOTE — Progress Notes (Signed)
We are sorry that you are not feeling well. Here is how we plan to help! Based on what you shared with me it looks like you: May have a yeast vaginosis  Vaginosis is an inflammation of the vagina that can result in discharge, itching and pain. The cause is usually a change in the normal balance of vaginal bacteria or an infection. Vaginosis can also result from reduced estrogen levels after menopause.  The most common causes of vaginosis are:   Bacterial vaginosis which results from an overgrowth of one on several organisms that are normally present in your vagina.   Yeast infections which are caused by a naturally occurring fungus called candida.   Vaginal atrophy (atrophic vaginosis) which results from the thinning of the vagina from reduced estrogen levels after menopause.   Trichomoniasis which is caused by a parasite and is commonly transmitted by sexual intercourse.  Factors that increase your risk of developing vaginosis include: . Medications, such as antibiotics and steroids . Uncontrolled diabetes . Use of hygiene products such as bubble bath, vaginal spray or vaginal deodorant . Douching . Wearing damp or tight-fitting clothing . Using an intrauterine device (IUD) for birth control . Hormonal changes, such as those associated with pregnancy, birth control pills or menopause . Sexual activity . Having a sexually transmitted infection  Your treatment plan is A single Diflucan (fluconazole) 150mg tablet once.  I have electronically sent this prescription into the pharmacy that you have chosen.  Be sure to take all of the medication as directed. Stop taking any medication if you develop a rash, tongue swelling or shortness of breath. Mothers who are breast feeding should consider pumping and discarding their breast milk while on these antibiotics. However, there is no consensus that infant exposure at these doses would be harmful.  Remember that medication creams can weaken latex  condoms. .   HOME CARE:  Good hygiene may prevent some types of vaginosis from recurring and may relieve some symptoms:  . Avoid baths, hot tubs and whirlpool spas. Rinse soap from your outer genital area after a shower, and dry the area well to prevent irritation. Don't use scented or harsh soaps, such as those with deodorant or antibacterial action. . Avoid irritants. These include scented tampons and pads. . Wipe from front to back after using the toilet. Doing so avoids spreading fecal bacteria to your vagina.  Other things that may help prevent vaginosis include:  . Don't douche. Your vagina doesn't require cleansing other than normal bathing. Repetitive douching disrupts the normal organisms that reside in the vagina and can actually increase your risk of vaginal infection. Douching won't clear up a vaginal infection. . Use a latex condom. Both female and female latex condoms may help you avoid infections spread by sexual contact. . Wear cotton underwear. Also wear pantyhose with a cotton crotch. If you feel comfortable without it, skip wearing underwear to bed. Yeast thrives in moist environments Your symptoms should improve in the next day or two.  GET HELP RIGHT AWAY IF:  . You have pain in your lower abdomen ( pelvic area or over your ovaries) . You develop nausea or vomiting . You develop a fever . Your discharge changes or worsens . You have persistent pain with intercourse . You develop shortness of breath, a rapid pulse, or you faint.  These symptoms could be signs of problems or infections that need to be evaluated by a medical provider now.  MAKE SURE YOU      Understand these instructions.  Will watch your condition.  Will get help right away if you are not doing well or get worse.  Your e-visit answers were reviewed by a board certified advanced clinical practitioner to complete your personal care plan. Depending upon the condition, your plan could have included  both over the counter or prescription medications. Please review your pharmacy choice to make sure that you have choses a pharmacy that is open for you to pick up any needed prescription, Your safety is important to us. If you have drug allergies check your prescription carefully.   You can use MyChart to ask questions about today's visit, request a non-urgent call back, or ask for a work or school excuse for 24 hours related to this e-Visit. If it has been greater than 24 hours you will need to follow up with your provider, or enter a new e-Visit to address those concerns. You will get a MyChart message within the next two days asking about your experience. I hope that your e-visit has been valuable and will speed your recovery.  Greater than 5 minutes, yet less than 10 minutes of time have been spent researching, coordinating, and implementing care for this patient today.  Thank you for the details you included in the comment boxes. Those details are very helpful in determining the best course of treatment for you and help us to provide the best care.  

## 2020-02-24 ENCOUNTER — Other Ambulatory Visit: Payer: Self-pay | Admitting: Internal Medicine

## 2020-02-25 NOTE — Telephone Encounter (Signed)
Lorazepam last filled 01/23/2020 #20, last OV 12/2019... last CPE CSA/UDS 05/2019.Marland KitchenMarland Kitchenplease advise

## 2020-02-27 ENCOUNTER — Other Ambulatory Visit: Payer: Self-pay | Admitting: Family

## 2020-02-27 DIAGNOSIS — M5440 Lumbago with sciatica, unspecified side: Secondary | ICD-10-CM

## 2020-02-28 ENCOUNTER — Telehealth: Payer: Federal, State, Local not specified - PPO | Admitting: Physician Assistant

## 2020-02-28 DIAGNOSIS — G8929 Other chronic pain: Secondary | ICD-10-CM | POA: Diagnosis not present

## 2020-02-28 DIAGNOSIS — M549 Dorsalgia, unspecified: Secondary | ICD-10-CM

## 2020-02-28 MED ORDER — GABAPENTIN 300 MG PO CAPS: 300.0000 mg | ORAL_CAPSULE | Freq: Two times a day (BID) | ORAL | 0 refills | Status: DC

## 2020-02-28 NOTE — Progress Notes (Signed)
We are sorry that you are not feeling well.  Here is how we plan to help!  Based on what you have shared with me it looks like you mostly have acute back pain.  Acute back pain is defined as musculoskeletal pain that can resolve in 1-3 weeks with conservative treatment.  I have prescribed gabapentin 300 mg twice daily.  Back pain is very common.  The pain often gets better over time.  The cause of back pain is usually not dangerous.  Most people can learn to manage their back pain on their own.  Home Care  Stay active.  Start with short walks on flat ground if you can.  Try to walk farther each day.  Do not sit, drive or stand in one place for more than 30 minutes.  Do not stay in bed.  Do not avoid exercise or work.  Activity can help your back heal faster.  Be careful when you bend or lift an object.  Bend at your knees, keep the object close to you, and do not twist.  Sleep on a firm mattress.  Lie on your side, and bend your knees.  If you lie on your back, put a pillow under your knees.  Only take medicines as told by your doctor.  Put ice on the injured area.  Put ice in a plastic bag  Place a towel between your skin and the bag  Leave the ice on for 15-20 minutes, 3-4 times a day for the first 2-3 days. 210 After that, you can switch between ice and heat packs.  Ask your doctor about back exercises or massage.  Avoid feeling anxious or stressed.  Find good ways to deal with stress, such as exercise.  Get Help Right Way If:  Your pain does not go away with rest or medicine.  Your pain does not go away in 1 week.  You have new problems.  You do not feel well.  The pain spreads into your legs.  You cannot control when you poop (bowel movement) or pee (urinate)  You feel sick to your stomach (nauseous) or throw up (vomit)  You have belly (abdominal) pain.  You feel like you may pass out (faint).  If you develop a fever.  Make Sure you:  Understand these  instructions.  Will watch your condition  Will get help right away if you are not doing well or get worse.  Your e-visit answers were reviewed by a board certified advanced clinical practitioner to complete your personal care plan.  Depending on the condition, your plan could have included both over the counter or prescription medications.  If there is a problem please reply  once you have received a response from your provider.  Your safety is important to [place].  If you have drug allergies check your prescription carefully.    You can use MyChart to ask questions about todays visit, request a non-urgent call back, or ask for a work or school excuse for 24 hours related to this e-Visit. If it has been greater than 24 hours you will need to follow up with your provider, or enter a new e-Visit to address those concerns.  You will get an e-mail in the next two days asking about your experience.  I hope that your e-visit has been valuable and will speed your recovery. Thank you for using e-visits.   Greater than 5 minutes, yet less than 10 minutes of time have been spent researching, coordinating,  and implementing care for this patient today.

## 2020-03-26 ENCOUNTER — Other Ambulatory Visit: Payer: Self-pay | Admitting: Internal Medicine

## 2020-03-26 MED ORDER — LORAZEPAM 0.5 MG PO TABS: 0.5000 mg | ORAL_TABLET | Freq: Every day | ORAL | 0 refills | Status: DC | PRN

## 2020-03-26 NOTE — Telephone Encounter (Signed)
Last filled 02/25/2020... please advise

## 2020-04-11 ENCOUNTER — Telehealth: Payer: Federal, State, Local not specified - PPO | Admitting: Nurse Practitioner

## 2020-04-11 DIAGNOSIS — H60333 Swimmer's ear, bilateral: Secondary | ICD-10-CM | POA: Diagnosis not present

## 2020-04-11 MED ORDER — NEOMYCIN-POLYMYXIN-HC 3.5-10000-1 OT SOLN
4.0000 [drp] | Freq: Four times a day (QID) | OTIC | 0 refills | Status: DC
Start: 2020-04-11 — End: 2020-04-30

## 2020-04-11 NOTE — Progress Notes (Signed)
E Visit for Swimmer's Ear  We are sorry that you are not feeling well. Here is how we plan to help!  I have prescribed: Neomycin 0.35%, polymyxin B 10,000 units/mL, and hydrocortisone 0,5% otic solution 4 drops in affected ears four times a day for 7 days    In certain cases swimmer's ear may progress to a more serious bacterial infection of the middle or inner ear.  If you have a fever 102 and up and significantly worsening symptoms, this could indicate a more serious infection moving to the middle/inner and needs face to face evaluation in an office by a provider.  Your symptoms should improve over the next 3 days and should resolve in about 7 days.  HOME CARE:   Wash your hands frequently.  Do not place the tip of the bottle on your ear or touch it with your fingers.  You can take Acetominophen 650 mg every 4-6 hours as needed for pain.  If pain is severe or moderate, you can apply a heating pad (set on low) or hot water bottle (wrapped in a towel) to outer ear for 20 minutes.  This will also increase drainage.  Avoid ear plugs  Do not use Q-tips  After showers, help the water run out by tilting your head to one side.  GET HELP RIGHT AWAY IF:   Fever is over 102.2 degrees.  You develop progressive ear pain or hearing loss.  Ear symptoms persist longer than 3 days after treatment.  MAKE SURE YOU:   Understand these instructions.  Will watch your condition.  Will get help right away if you are not doing well or get worse.  TO PREVENT SWIMMER'S EAR:  Use a bathing cap or custom fitted swim molds to keep your ears dry.  Towel off after swimming to dry your ears.  Tilt your head or pull your earlobes to allow the water to escape your ear canal.  If there is still water in your ears, consider using a hairdryer on the lowest setting.  Thank you for choosing an e-visit. Your e-visit answers were reviewed by a board certified advanced clinical practitioner to  complete your personal care plan. Depending upon the condition, your plan could have included both over the counter or prescription medications. Please review your pharmacy choice. Be sure that the pharmacy you have chosen is open so that you can pick up your prescription now.  If there is a problem you may message your provider in MyChart to have the prescription routed to another pharmacy. Your safety is important to us. If you have drug allergies check your prescription carefully.  For the next 24 hours, you can use MyChart to ask questions about today's visit, request a non-urgent call back, or ask for a work or school excuse from your e-visit provider. You will get an email in the next two days asking about your experience. I hope that your e-visit has been valuable and will speed your recovery.   5-10 minutes spent reviewing and documenting in chart.     

## 2020-04-14 ENCOUNTER — Other Ambulatory Visit: Payer: Self-pay | Admitting: Physician Assistant

## 2020-04-14 ENCOUNTER — Other Ambulatory Visit: Payer: Self-pay | Admitting: Internal Medicine

## 2020-04-14 ENCOUNTER — Telehealth: Payer: Federal, State, Local not specified - PPO | Admitting: Physician Assistant

## 2020-04-14 DIAGNOSIS — M545 Low back pain, unspecified: Secondary | ICD-10-CM

## 2020-04-14 MED ORDER — GABAPENTIN 300 MG PO CAPS
300.0000 mg | ORAL_CAPSULE | Freq: Two times a day (BID) | ORAL | 0 refills | Status: DC
Start: 2020-04-14 — End: 2020-06-20

## 2020-04-14 NOTE — Progress Notes (Signed)
We are sorry that you are not feeling well.  Here is how we plan to help!  Based on what you have shared with me it looks like you mostly have acute back pain.  Acute back pain is defined as musculoskeletal pain that can resolve in 1-3 weeks with conservative treatment.  I have prescribed gabapentin 300 mg twice daily. Some patients experience stomach irritation or in increased heartburn with anti-inflammatory drugs.  Please keep in mind that muscle relaxer's can cause fatigue and should not be taken while at work or driving.  Back pain is very common.  The pain often gets better over time.  The cause of back pain is usually not dangerous.  Most people can learn to manage their back pain on their own.  Home Care  Stay active.  Start with short walks on flat ground if you can.  Try to walk farther each day.  Do not sit, drive or stand in one place for more than 30 minutes.  Do not stay in bed.  Do not avoid exercise or work.  Activity can help your back heal faster.  Be careful when you bend or lift an object.  Bend at your knees, keep the object close to you, and do not twist.  Sleep on a firm mattress.  Lie on your side, and bend your knees.  If you lie on your back, put a pillow under your knees.  Only take medicines as told by your doctor.  Put ice on the injured area.  Put ice in a plastic bag  Place a towel between your skin and the bag  Leave the ice on for 15-20 minutes, 3-4 times a day for the first 2-3 days. 210 After that, you can switch between ice and heat packs.  Ask your doctor about back exercises or massage.  Avoid feeling anxious or stressed.  Find good ways to deal with stress, such as exercise.  Get Help Right Way If:  Your pain does not go away with rest or medicine.  Your pain does not go away in 1 week.  You have new problems.  You do not feel well.  The pain spreads into your legs.  You cannot control when you poop (bowel movement) or pee  (urinate)  You feel sick to your stomach (nauseous) or throw up (vomit)  You have belly (abdominal) pain.  You feel like you may pass out (faint).  If you develop a fever.  Make Sure you:  Understand these instructions.  Will watch your condition  Will get help right away if you are not doing well or get worse.  Your e-visit answers were reviewed by a board certified advanced clinical practitioner to complete your personal care plan.  Depending on the condition, your plan could have included both over the counter or prescription medications.  If there is a problem please reply  once you have received a response from your provider.  Your safety is important to [place].  If you have drug allergies check your prescription carefully.    You can use MyChart to ask questions about today's visit, request a non-urgent call back, or ask for a work or school excuse for 24 hours related to this e-Visit. If it has been greater than 24 hours you will need to follow up with your provider, or enter a new e-Visit to address those concerns.  You will get an e-mail in the next two days asking about your experience.  I hope that  your e-visit has been valuable and will speed your recovery. Thank you for using e-visits.  Approximately 5 minutes was spent documenting and reviewing patient's chart.

## 2020-04-16 ENCOUNTER — Other Ambulatory Visit: Payer: Self-pay | Admitting: Internal Medicine

## 2020-04-17 MED ORDER — BUPROPION HCL ER (XL) 300 MG PO TB24: 300.0000 mg | ORAL_TABLET | Freq: Every day | ORAL | 0 refills | Status: DC

## 2020-04-24 ENCOUNTER — Other Ambulatory Visit: Payer: Self-pay | Admitting: Internal Medicine

## 2020-04-24 NOTE — Telephone Encounter (Signed)
Last filled 03/26/2020... please advise  

## 2020-04-25 ENCOUNTER — Encounter: Payer: Self-pay | Admitting: Internal Medicine

## 2020-04-25 ENCOUNTER — Other Ambulatory Visit: Payer: Self-pay | Admitting: Internal Medicine

## 2020-04-25 MED ORDER — LORAZEPAM 0.5 MG PO TABS: 0.5000 mg | ORAL_TABLET | Freq: Every day | ORAL | 0 refills | Status: DC | PRN

## 2020-04-25 NOTE — Telephone Encounter (Signed)
Rx called in to pharmacy. 

## 2020-04-30 ENCOUNTER — Encounter: Payer: Self-pay | Admitting: Internal Medicine

## 2020-04-30 ENCOUNTER — Other Ambulatory Visit: Payer: Self-pay

## 2020-04-30 ENCOUNTER — Ambulatory Visit: Payer: Federal, State, Local not specified - PPO | Admitting: Internal Medicine

## 2020-04-30 VITALS — BP 136/90 | HR 86 | Temp 97.8°F | Wt 200.0 lb

## 2020-04-30 DIAGNOSIS — H9202 Otalgia, left ear: Secondary | ICD-10-CM

## 2020-04-30 DIAGNOSIS — I10 Essential (primary) hypertension: Secondary | ICD-10-CM | POA: Diagnosis not present

## 2020-04-30 DIAGNOSIS — F32A Depression, unspecified: Secondary | ICD-10-CM

## 2020-04-30 DIAGNOSIS — F419 Anxiety disorder, unspecified: Secondary | ICD-10-CM

## 2020-04-30 MED ORDER — AMLODIPINE BESYLATE 5 MG PO TABS
5.0000 mg | ORAL_TABLET | Freq: Every day | ORAL | 0 refills | Status: DC
Start: 2020-04-30 — End: 2020-07-03

## 2020-04-30 MED ORDER — VENLAFAXINE HCL ER 75 MG PO CP24: 75.0000 mg | ORAL_CAPSULE | Freq: Every day | ORAL | 2 refills | Status: DC

## 2020-04-30 NOTE — Progress Notes (Signed)
Subjective:    Patient ID: Linda [name], female    DOB: 04/26/1976, 44 y.o.   MRN: [id number]  HPI  Pt presents to the clinic today with c/o elevated blood pressure. She reports she has been checking her BP at [facility], ranges 125/88-152/96. She has been having headaches. These are located in her forehead. She describes the pain as pressure or throbbing. She reports associated dizziness but denies visual changes. She denies neck pain. She reports these headaches feel different than her migraines. Her BP today is 136/90.  She also reports left ear pain. This started 2 months. She describes the pain as pressure, itchy with decreased hearing.The pain is intermittent. She reports she has been on 2 rounds of abx recently for the same without any improvement in symptoms. She reports history of ear infections and t tube placement. She has an appt with ENT 05/30/20.  She also reports worsening anxiety and depression. She feels like this is being triggered by having to work from home, her on again/off again partner just moved out of state, she has no family support here. She is currently taking Wellbutrin and Lorazepam but does not feel like her regimen is effective. She has tried Sertraline, Escitalopram, Buspar and Hydroxyzine in the past. She is seeing a therapist. She denies SI/HI.  Review of Systems      Past Medical History:  Diagnosis Date  . Alcohol abuse   . Anxiety   . Asthma   . GERD (gastroesophageal reflux disease)   . Heart murmur    while pregnant  . Migraine     Current Outpatient Medications  Medication Sig Dispense Refill  . albuterol (VENTOLIN HFA) 108 (90 Base) MCG/ACT inhaler Inhale 2 puffs into the lungs every 6 (six) hours as needed for wheezing or shortness of breath. 18 g 0  . buPROPion (WELLBUTRIN XL) 300 MG 24 hr tablet Take 1 tablet (300 mg total) by mouth daily. 90 tablet 0  . cyclobenzaprine (FLEXERIL) 10 MG tablet Take 1 tablet (10 mg total) by mouth 3 (three)  times daily as needed for muscle spasms. 10 tablet 0  . gabapentin (NEURONTIN) 300 MG capsule Take 1 capsule (300 mg total) by mouth 2 (two) times daily. 60 capsule 0  . LORazepam (ATIVAN) 0.5 MG tablet Take 1 tablet (0.5 mg total) by mouth daily as needed for anxiety. 20 tablet 0  . neomycin-polymyxin-hydrocortisone (CORTISPORIN) OTIC solution Place 4 drops into both ears 4 (four) times daily. 10 mL 0  . SUMAtriptan (IMITREX) 50 MG tablet Take 1 tablet (50 mg total) by mouth every 2 (two) hours as needed for migraine. May repeat in 2 hours if headache persists or recurs. 10 tablet 5   No current facility-administered medications for this visit.    Allergies  Allergen Reactions  . Nsaids Other (See Comments)    Family History  Problem Relation Age of Onset  . Hypertension Father   . Diabetes Father   . Hypertension Mother   . Cancer Mother        uterine  . Stroke Mother   . Diabetes Maternal Aunt   . Diabetes Maternal Uncle   . Cancer Maternal Grandmother        PANCREATIC  . Hypertension Maternal Grandmother   . Diabetes Maternal Grandmother   . Cancer Maternal Grandfather        LUNG  . Hypertension Maternal Grandfather   . Diabetes Maternal Grandfather   . Cancer Paternal Grandmother  THROAT  . Alzheimer's disease Paternal Grandmother     Social History   Socioeconomic History  . Marital status: Divorced    Spouse name: Not on file  . Number of children: Not on file  . Years of education: Not on file  . Highest education level: Not on file  Occupational History  . Not on file  Tobacco Use  . Smoking status: Never Smoker  . Smokeless tobacco: Never Used  Vaping Use  . Vaping Use: Never used  Substance and Sexual Activity  . Alcohol use: Yes    Comment: occ  . Drug use: No  . Sexual activity: Not on file  Other Topics Concern  . Not on file  Social History Narrative  . Not on file   Social Determinants of Health   Financial Resource Strain:   .  Difficulty of Paying Living Expenses: Not on file  Food Insecurity:   . Worried About Charity fundraiser in the Last Year: Not on file  . Ran Out of Food in the Last Year: Not on file  Transportation Needs:   . Lack of Transportation (Medical): Not on file  . Lack of Transportation (Non-Medical): Not on file  Physical Activity:   . Days of Exercise per Week: Not on file  . Minutes of Exercise per Session: Not on file  Stress:   . Feeling of Stress : Not on file  Social Connections:   . Frequency of Communication with Friends and Family: Not on file  . Frequency of Social Gatherings with Friends and Family: Not on file  . Attends Religious Services: Not on file  . Active Member of Clubs or Organizations: Not on file  . Attends Archivist Meetings: Not on file  . Marital Status: Not on file  Intimate Partner Violence:   . Fear of Current or Ex-Partner: Not on file  . Emotionally Abused: Not on file  . Physically Abused: Not on file  . Sexually Abused: Not on file     Constitutional: Denies fever, malaise, fatigue, headache or abrupt weight changes.  HEENT: Pt reports left ear pain. Denies eye pain, eye redness, ringing in the ears, wax buildup, runny nose, nasal congestion, bloody nose, or sore throat. Respiratory: Denies difficulty breathing, shortness of breath, cough or sputum production.   Cardiovascular: Denies chest pain, chest tightness, palpitations or swelling in the hands or feet.  Neurological: Denies dizziness, difficulty with memory, difficulty with speech or problems with balance and coordination.  Psych: Pt reports anxiety and depression. Denies SI/HI.  No other specific complaints in a complete review of systems (except as listed in HPI above).  Objective:   Physical Exam   BP 136/90   Pulse 86   Temp 97.8 F (36.6 C) (Temporal)   Wt 200 lb (90.7 kg)   LMP 07/12/2011   SpO2 98%   BMI 30.41 kg/m   Wt Readings from Last 3 Encounters:  02/08/20  200 lb 6.4 oz (90.9 kg)  12/13/19 197 lb (89.4 kg)  06/13/19 175 lb (79.4 kg)    General: Appears her stated age, obese, in NAD. HEENT: Head: normal shape and size; Eyes: sclera white, no icterus, conjunctiva pink, PERRLA and EOMs intact; Ears: Tm's growing over t tube, distorted light reflex;  Neck:  Neck supple, trachea midline. No masses, lumps or thyromegaly present.  Cardiovascular: Normal rate and rhythm. S1,S2 noted.  No murmur, rubs or gallops noted.  Pulmonary/Chest: Normal effort and positive vesicular breath sounds.  No respiratory distress. No wheezes, rales or ronchi noted.  Neurological: Alert and oriented.  Psychiatric: Mood and affect flat. Behavior is normal. Judgment and thought content normal.     BMET    Component Value Date/Time   NA 136 02/08/2020 0600   NA 137 05/31/2013 0846   K 3.6 02/08/2020 0600   K 3.8 05/31/2013 0846   CL 101 02/08/2020 0600   CL 106 05/31/2013 0846   CO2 23 02/08/2020 0600   CO2 26 05/31/2013 0846   GLUCOSE 99 02/08/2020 0600   GLUCOSE 93 05/31/2013 0846   BUN 9 02/08/2020 0600   BUN 10 05/31/2013 0846   CREATININE 0.79 02/08/2020 0600   CREATININE 0.76 05/31/2013 0846   CALCIUM 9.0 02/08/2020 0600   CALCIUM 8.2 (L) 05/31/2013 0846   GFRNONAA >60 02/08/2020 0600   GFRNONAA >60 05/31/2013 0846   GFRAA >60 02/08/2020 0600   GFRAA >60 05/31/2013 0846    Lipid Panel     Component Value Date/Time   CHOL 232 (H) 05/17/2019 1606   TRIG 176.0 (H) 05/17/2019 1606   HDL 102.60 05/17/2019 1606   CHOLHDL 2 05/17/2019 1606   VLDL 35.2 05/17/2019 1606   LDLCALC 94 05/17/2019 1606    CBC    Component Value Date/Time   WBC 7.9 02/08/2020 0600   RBC 4.20 02/08/2020 0600   HGB 12.6 02/08/2020 0600   HGB 13.3 05/31/2013 0846   HCT 38.5 02/08/2020 0600   HCT 39.2 05/31/2013 0846   PLT 268 02/08/2020 0600   PLT 219 05/31/2013 0846   MCV 91.7 02/08/2020 0600   MCV 88 05/31/2013 0846   MCH 30.0 02/08/2020 0600   MCHC 32.7  02/08/2020 0600   RDW 12.6 02/08/2020 0600   RDW 12.8 05/31/2013 0846   LYMPHSABS 2.1 06/13/2019 2235   LYMPHSABS 2.1 05/31/2013 0846   MONOABS 0.4 06/13/2019 2235   MONOABS 0.4 05/31/2013 0846   EOSABS 0.0 06/13/2019 2235   EOSABS 0.1 05/31/2013 0846   BASOSABS 0.1 06/13/2019 2235   BASOSABS 0.1 05/31/2013 0846    Hgb A1C Lab Results  Component Value Date   HGBA1C 6.1 09/02/2015           Assessment & Plan:   Left Otalgia:  No evidence of infection Recommend Flonase BID x 3 days then daily thereafter Keep your follow up appt with ENT   Webb Silversmith, NP This visit occurred during the SARS-CoV-2 public health emergency.  Safety protocols were in place, including screening questions prior to the visit, additional usage of staff PPE, and extensive cleaning of exam room while observing appropriate contact time as indicated for disinfecting solutions.

## 2020-05-01 DIAGNOSIS — I10 Essential (primary) hypertension: Secondary | ICD-10-CM | POA: Insufficient documentation

## 2020-05-01 NOTE — Patient Instructions (Signed)

## 2020-05-01 NOTE — Assessment & Plan Note (Signed)
Deteriorated Will wean Wellbutrin and trial Venlafaxine Continue Lorazepam as needed Continue to meet with your therapist  Support offered

## 2020-05-01 NOTE — Assessment & Plan Note (Signed)
Borderline Will start Amlodipine 5 mg daily Reinforced DASH diet and exercise for weight loss  RTC in 2 weeks for follow up HTN

## 2020-05-06 MED ORDER — ALPRAZOLAM 0.5 MG PO TABS
0.5000 mg | ORAL_TABLET | Freq: Every day | ORAL | 0 refills | Status: DC | PRN
Start: 2020-05-06 — End: 2020-06-02

## 2020-05-06 NOTE — Addendum Note (Signed)
Addended by: Jearld Fenton on: 05/06/2020 02:40 PM   Modules accepted: Orders

## 2020-05-09 ENCOUNTER — Telehealth: Payer: Federal, State, Local not specified - PPO | Admitting: Nurse Practitioner

## 2020-05-09 DIAGNOSIS — B373 Candidiasis of vulva and vagina: Secondary | ICD-10-CM | POA: Diagnosis not present

## 2020-05-09 DIAGNOSIS — B3731 Acute candidiasis of vulva and vagina: Secondary | ICD-10-CM

## 2020-05-09 NOTE — Progress Notes (Signed)

## 2020-05-10 MED ORDER — FLUCONAZOLE 150 MG PO TABS: 150.0000 mg | ORAL_TABLET | Freq: Every day | ORAL | 0 refills | Status: AC

## 2020-05-10 NOTE — Addendum Note (Signed)
Addended by: Kinnie Feil on: 05/10/2020 08:31 AM   Modules accepted: Orders

## 2020-05-13 ENCOUNTER — Telehealth: Payer: Federal, State, Local not specified - PPO | Admitting: Physician Assistant

## 2020-05-13 DIAGNOSIS — R112 Nausea with vomiting, unspecified: Secondary | ICD-10-CM

## 2020-05-13 MED ORDER — METOCLOPRAMIDE HCL 10 MG PO TABS
10.0000 mg | ORAL_TABLET | Freq: Four times a day (QID) | ORAL | 0 refills | Status: DC | PRN
Start: 2020-05-13 — End: 2020-05-13

## 2020-05-13 MED ORDER — METOCLOPRAMIDE HCL 10 MG PO TABS
10.0000 mg | ORAL_TABLET | Freq: Four times a day (QID) | ORAL | 0 refills | Status: DC | PRN
Start: 2020-05-13 — End: 2020-11-05

## 2020-05-13 NOTE — Progress Notes (Signed)
We are sorry that you are not feeling well. Here is how we plan to help!  Based on what you have shared with me it looks like you have nausea and vomiting related to your migraine.  Vomiting is the forceful emptying of a portion of the stomach's content through the mouth.  Although nausea and vomiting can make you feel miserable, it's important to remember that these are not diseases, but rather symptoms of an underlying illness.  When we treat short term symptoms, we always caution that any symptoms that persist should be fully evaluated in a medical office.  I have prescribed a medication that will help alleviate your symptoms and allow you to stay hydrated:  You were given a prescription for Reglan to take for your nausea and vomiting.   HOME CARE:  Drink clear liquids.  This is very important! Dehydration (the lack of fluid) can lead to a serious complication.  Start off with 1 tablespoon every 5 minutes for 8 hours.  You may begin eating bland foods after 8 hours without vomiting.  Start with saltine crackers, white bread, rice, mashed potatoes, applesauce.  After 48 hours on a bland diet, you may resume a normal diet.  Try to go to sleep.  Sleep often empties the stomach and relieves the need to vomit.  GET HELP RIGHT AWAY IF:   Your symptoms do not improve or worsen within 2 days after treatment.  You have a fever for over 3 days.  You cannot keep down fluids after trying the medication.  MAKE SURE YOU:   Understand these instructions.  Will watch your condition.  Will get help right away if you are not doing well or get worse.   Thank you for choosing an e-visit. Your e-visit answers were reviewed by a board certified advanced clinical practitioner to complete your personal care plan. Depending upon the condition, your plan could have included both over the counter or prescription medications. Please review your pharmacy choice. Be sure that the pharmacy you have chosen  is open so that you can pick up your prescription now.  If there is a problem you may message your provider in [place] to have the prescription routed to another pharmacy. Your safety is important to [place]. If you have drug allergies check your prescription carefully.  For the next 24 hours, you can use MyChart to ask questions about today's visit, request a non-urgent call back, or ask for a work or school excuse from your e-visit provider. You will get an e-mail in the next two days asking about your experience. I hope that your e-visit has been valuable and will speed your recovery.  Approximately 5 minutes was spent documenting and reviewing patient's chart.

## 2020-05-18 ENCOUNTER — Telehealth: Payer: Federal, State, Local not specified - PPO | Admitting: Family

## 2020-05-18 DIAGNOSIS — N898 Other specified noninflammatory disorders of vagina: Secondary | ICD-10-CM

## 2020-05-18 NOTE — Progress Notes (Signed)
Based on what you shared with me, I feel your condition warrants further evaluation and I recommend that you be seen for a face to face office visit.   Given you were just treated, and symptoms have worsen you need to be tested for yeast vs bacterial vaginosis.    NOTE: If you entered your credit card information for this eVisit, you will not be charged. You may see a "hold" on your card for the $35 but that hold will drop off and you will not have a charge processed.   If you are having a true medical emergency please call 911.      For an urgent face to face visit, [facility] has five urgent care centers for your convenience:     [facility] [place] [facility] [place] Get Driving Directions [phone] [address] [place], [place] [address] . 10 am - 6pm Monday - Friday    [facility]) Get Driving Directions [phone] [address] [place], [place] [address] . 10 am to 8 pm Monday-Friday . 12 pm to 8 pm [facility] at [facility] Get Driving Directions [phone] [place], [place], [place] [address] . 8 am to 8 pm Monday-Friday . 9 am to 6 pm Saturday . 11 am to 6 pm Sunday     [facility] at [facility] Get Driving Directions  [phone] [address], [place] [address] . 8 am to 8 pm Monday-Friday . 8 am to 4 pm [facility] [place] Get Driving Directions [phone] [address] [place],  [address] . 12 pm to 6 pm Monday-Friday      Your e-visit answers were reviewed by a board certified advanced clinical practitioner to complete your personal care plan.  Thank you for using e-Visits.

## 2020-05-23 ENCOUNTER — Other Ambulatory Visit: Payer: Self-pay | Admitting: Internal Medicine

## 2020-06-02 ENCOUNTER — Other Ambulatory Visit: Payer: Self-pay | Admitting: Internal Medicine

## 2020-06-02 NOTE — Telephone Encounter (Signed)
Xanax due 06/05/2020--Thanksgiving day, pt os going out of town and [place] like to pick up 06/04/2020...Marland Kitchen please advise... already put note to pharmacy [facility] on or after 06/04/20 if you approve

## 2020-06-03 MED ORDER — ALPRAZOLAM 0.5 MG PO TABS: 0.5000 mg | ORAL_TABLET | Freq: Every day | ORAL | 0 refills | Status: DC | PRN

## 2020-06-20 ENCOUNTER — Telehealth: Payer: Federal, State, Local not specified - PPO | Admitting: Nurse Practitioner

## 2020-06-20 DIAGNOSIS — M545 Low back pain, unspecified: Secondary | ICD-10-CM | POA: Diagnosis not present

## 2020-06-20 MED ORDER — CYCLOBENZAPRINE HCL 10 MG PO TABS: 10.0000 mg | ORAL_TABLET | Freq: Three times a day (TID) | ORAL | 1 refills | Status: DC | PRN

## 2020-06-20 MED ORDER — GABAPENTIN 300 MG PO CAPS: 300.0000 mg | ORAL_CAPSULE | Freq: Two times a day (BID) | ORAL | 0 refills | Status: DC

## 2020-06-20 NOTE — Progress Notes (Signed)
We are sorry that you are not feeling well.  Here is how we plan to help!  Based on what you have shared with me it looks like you mostly have acute back pain  * work note is in your my chart  Acute back pain is defined as musculoskeletal pain that can resolve in 1-3 weeks with conservative treatment.  I have prescribed flexeril 10mg   take one by mouth twice a day as well as gabapentin refill  Some patients experience stomach irritation or in increased heartburn with anti-inflammatory drugs.  Please keep in mind that muscle relaxer's can cause fatigue and should not be taken while at work or driving.  Back pain is very common.  The pain often gets better over time.  The cause of back pain is usually not dangerous.  Most people can learn to manage their back pain on their own.  Home Care  Stay active.  Start with short walks on flat ground if you can.  Try to walk farther each day.  Do not sit, drive or stand in one place for more than 30 minutes.  Do not stay in bed.  Do not avoid exercise or work.  Activity can help your back heal faster.  Be careful when you bend or lift an object.  Bend at your knees, keep the object close to you, and do not twist.  Sleep on a firm mattress.  Lie on your side, and bend your knees.  If you lie on your back, put a pillow under your knees.  Only take medicines as told by your doctor.  Put ice on the injured area.  Put ice in a plastic bag  Place a towel between your skin and the bag  Leave the ice on for 15-20 minutes, 3-4 times a day for the first 2-3 days. 210 After that, you can switch between ice and heat packs.  Ask your doctor about back exercises or massage.  Avoid feeling anxious or stressed.  Find good ways to deal with stress, such as exercise.  Get Help Right Way If:  Your pain does not go away with rest or medicine.  Your pain does not go away in 1 week.  You have new problems.  You do not feel well.  The pain spreads into  your legs.  You cannot control when you poop (bowel movement) or pee (urinate)  You feel sick to your stomach (nauseous) or throw up (vomit)  You have belly (abdominal) pain.  You feel like you may pass out (faint).  If you develop a fever.  Make Sure you:  Understand these instructions.  Will watch your condition  Will get help right away if you are not doing well or get worse.  Your e-visit answers were reviewed by a board certified advanced clinical practitioner to complete your personal care plan.  Depending on the condition, your plan could have included both over the counter or prescription medications.  If there is a problem please reply  once you have received a response from your provider.  Your safety is important to [place].  If you have drug allergies check your prescription carefully.    You can use MyChart to ask questions about today's visit, request a non-urgent call back, or ask for a work or school excuse for 24 hours related to this e-Visit. If it has been greater than 24 hours you will need to follow up with your provider, or enter a new e-Visit to address those  concerns.  You will get an e-mail in the next two days asking about your experience.  I hope that your e-visit has been valuable and will speed your recovery. Thank you for using e-visits.  5-10 minutes spent reviewing and documenting in chart.

## 2020-06-23 ENCOUNTER — Other Ambulatory Visit: Payer: Self-pay

## 2020-06-23 ENCOUNTER — Encounter: Payer: Self-pay | Admitting: Internal Medicine

## 2020-06-23 MED ORDER — SUMATRIPTAN SUCCINATE 50 MG PO TABS: 50.0000 mg | ORAL_TABLET | ORAL | 5 refills | Status: DC | PRN

## 2020-06-23 NOTE — Telephone Encounter (Signed)
Last filled 10/15/2019 with 5 refills... please advise

## 2020-07-02 ENCOUNTER — Other Ambulatory Visit: Payer: Self-pay | Admitting: Internal Medicine

## 2020-07-02 MED ORDER — ALPRAZOLAM 0.5 MG PO TABS: 0.5000 mg | ORAL_TABLET | Freq: Every day | ORAL | 0 refills | Status: DC | PRN

## 2020-07-02 NOTE — Telephone Encounter (Signed)
Last office visit 04/30/2020 for HTN, HA, Otalgia & Anxiety.  Last refilled 06/03/2020 for #20 with no refills.  No future appointments.

## 2020-07-17 ENCOUNTER — Encounter: Payer: Self-pay | Admitting: Internal Medicine

## 2020-07-21 ENCOUNTER — Ambulatory Visit: Payer: Federal, State, Local not specified - PPO | Admitting: Internal Medicine

## 2020-07-25 ENCOUNTER — Emergency Department (HOSPITAL_BASED_OUTPATIENT_CLINIC_OR_DEPARTMENT_OTHER): Payer: Federal, State, Local not specified - PPO

## 2020-07-25 ENCOUNTER — Other Ambulatory Visit: Payer: Self-pay

## 2020-07-25 ENCOUNTER — Emergency Department (HOSPITAL_BASED_OUTPATIENT_CLINIC_OR_DEPARTMENT_OTHER)
Admission: EM | Admit: 2020-07-25 | Discharge: 2020-07-25 | Disposition: A | Discharge status: Home / Self Care | Payer: Federal, State, Local not specified - PPO | Attending: Emergency Medicine | Admitting: Emergency Medicine

## 2020-07-25 ENCOUNTER — Encounter (HOSPITAL_BASED_OUTPATIENT_CLINIC_OR_DEPARTMENT_OTHER): Payer: Self-pay | Admitting: Emergency Medicine

## 2020-07-25 DIAGNOSIS — J45909 Unspecified asthma, uncomplicated: Secondary | ICD-10-CM | POA: Diagnosis not present

## 2020-07-25 DIAGNOSIS — R0789 Other chest pain: Secondary | ICD-10-CM

## 2020-07-25 DIAGNOSIS — Z79899 Other long term (current) drug therapy: Secondary | ICD-10-CM | POA: Diagnosis not present

## 2020-07-25 DIAGNOSIS — R0602 Shortness of breath: Secondary | ICD-10-CM | POA: Insufficient documentation

## 2020-07-25 DIAGNOSIS — R079 Chest pain, unspecified: Secondary | ICD-10-CM | POA: Diagnosis not present

## 2020-07-25 DIAGNOSIS — J9811 Atelectasis: Secondary | ICD-10-CM | POA: Diagnosis not present

## 2020-07-25 DIAGNOSIS — I10 Essential (primary) hypertension: Secondary | ICD-10-CM | POA: Diagnosis not present

## 2020-07-25 DIAGNOSIS — R059 Cough, unspecified: Secondary | ICD-10-CM | POA: Diagnosis not present

## 2020-07-25 HISTORY — DX: Essential (primary) hypertension: I10

## 2020-07-25 LAB — CBC WITH DIFFERENTIAL/PLATELET
Abs Immature Granulocytes: 0.02 10*3/uL (ref 0.00–0.07)
Basophils Absolute: 0 10*3/uL (ref 0.0–0.1)
Basophils Relative: 1 %
Eosinophils Absolute: 0 10*3/uL (ref 0.0–0.5)
Eosinophils Relative: 0 %
HCT: 38.7 % (ref 36.0–46.0)
Hemoglobin: 13 g/dL (ref 12.0–15.0)
Immature Granulocytes: 0 %
Lymphocytes Relative: 18 %
Lymphs Abs: 1.4 10*3/uL (ref 0.7–4.0)
MCH: 30.2 pg (ref 26.0–34.0)
MCHC: 33.6 g/dL (ref 30.0–36.0)
MCV: 89.8 fL (ref 80.0–100.0)
Monocytes Absolute: 0.4 10*3/uL (ref 0.1–1.0)
Monocytes Relative: 6 %
Neutro Abs: 5.6 10*3/uL (ref 1.7–7.7)
Neutrophils Relative %: 75 %
Platelets: 236 10*3/uL (ref 150–400)
RBC: 4.31 MIL/uL (ref 3.87–5.11)
RDW: 13 % (ref 11.5–15.5)
WBC: 7.5 10*3/uL (ref 4.0–10.5)
nRBC: 0 % (ref 0.0–0.2)

## 2020-07-25 LAB — BASIC METABOLIC PANEL
Anion gap: 11 (ref 5–15)
BUN: 16 mg/dL (ref 6–20)
CO2: 25 mmol/L (ref 22–32)
Calcium: 8.6 mg/dL — ABNORMAL LOW (ref 8.9–10.3)
Chloride: 99 mmol/L (ref 98–111)
Creatinine, Ser: 0.71 mg/dL (ref 0.44–1.00)
GFR, Estimated: >60 mL/min (ref >60)
Glucose, Bld: 97 mg/dL (ref 70–99)
Potassium: 3.3 mmol/L — ABNORMAL LOW (ref 3.5–5.1)
Sodium: 135 mmol/L (ref 135–145)

## 2020-07-25 MED ORDER — IOHEXOL 350 MG/ML SOLN
100.0000 mL | Freq: Once | INTRAVENOUS | Status: AC | PRN
  Administered 2020-07-25: 100 mL via INTRAVENOUS

## 2020-07-25 NOTE — ED Notes (Signed)
Patient verbalizes understanding of discharge instructions. Opportunity for questioning and answers were provided. Armband removed by staff, pt discharged from ED ambulatory to home.  

## 2020-07-25 NOTE — ED Provider Notes (Signed)
[facility] Provider Note: [name], MD, FACEP  CSN: [id number] MRN: [id number] ARRIVAL: 07/25/20 at [place]  Chest Pain   HISTORY OF PRESENT ILLNESS  07/25/20 3:56 AM Linda [name] is a 45 y.o. female with about a month of chest pain.  The pain is across her upper chest and is worse with coughing or deep breath.  She has had some mild shortness of breath with this.  She has had 3 negative COVID tests in the past month.  She is also had cramping in her right calf over the last month with swelling that has subsequently resolved.  She has a history of GERD but is on Prilosec.   Past Medical History:  Diagnosis Date  . Alcohol abuse   . Anxiety   . Asthma   . GERD (gastroesophageal reflux disease)   . Heart murmur    while pregnant  . Hypertension   . Migraine     Past Surgical History:  Procedure Laterality Date  . ABDOMINAL HYSTERECTOMY  12/20012   Total  . ENDOMETRIAL ABLATION  2012  . GASTRIC ROUX-EN-Y N/A 05/18/2016   Procedure: LAPAROSCOPIC ROUX-EN-Y GASTRIC WITH UPPER ENDO;  Surgeon: Arta Bruce Kinsinger, MD;  Location: [facility];  Service: General;  Laterality: N/A;  . MYRINGOTOMY WITH TUBE PLACEMENT     X 3 SINCE ORRIGINALLY PLACE IN 2010  . TONSILECTOMY, ADENOIDECTOMY, BILATERAL MYRINGOTOMY AND TUBES  2010  . TONSILLECTOMY    . TUBAL LIGATION  08/2010  . UPPER GI ENDOSCOPY  05/18/2016   Procedure: UPPER GI ENDOSCOPY;  Surgeon: Arta Bruce Kinsinger, MD;  Location: [facility];  Service: General;;    Family History  Problem Relation Age of Onset  . Hypertension Father   . Diabetes Father   . Hypertension Mother   . Cancer Mother        uterine  . Stroke Mother   . Diabetes Maternal Aunt   . Diabetes Maternal Uncle   . Cancer Maternal Grandmother        PANCREATIC  . Hypertension Maternal Grandmother   . Diabetes Maternal Grandmother   . Cancer Maternal Grandfather        LUNG  . Hypertension Maternal  Grandfather   . Diabetes Maternal Grandfather   . Cancer Paternal Grandmother        THROAT  . Alzheimer's disease Paternal Grandmother     Social History   Tobacco Use  . Smoking status: Never Smoker  . Smokeless tobacco: Never Used  Vaping Use  . Vaping Use: Never used  Substance Use Topics  . Alcohol use: Yes    Comment: occ  . Drug use: No    Prior to Admission medications   Medication Sig Start Date End Date Taking? Authorizing Provider  albuterol (VENTOLIN HFA) 108 (90 Base) MCG/ACT inhaler Inhale 2 puffs into the lungs every 6 (six) hours as needed for wheezing or shortness of breath. 09/13/19   Jearld Fenton, NP  ALPRAZolam Duanne Moron) 0.5 MG tablet Take 1 tablet (0.5 mg total) by mouth daily as needed for anxiety. 07/02/20   Jearld Fenton, NP  amLODipine (NORVASC) 5 MG tablet TAKE 1 TABLET BY MOUTH EVERY DAY 07/03/20   Jearld Fenton, NP  buPROPion (WELLBUTRIN XL) 300 MG 24 hr tablet Take 1 tablet (300 mg total) by mouth daily. 04/17/20   Jearld Fenton, NP  cyclobenzaprine (FLEXERIL) 10 MG tablet Take 1 tablet (10 mg total) by  mouth 3 (three) times daily as needed for muscle spasms. 06/20/20   Hassell Done, Mary-Margaret, FNP  gabapentin (NEURONTIN) 300 MG capsule Take 1 capsule (300 mg total) by mouth 2 (two) times daily. 06/20/20 07/20/20  Hassell Done Mary-Margaret, FNP  metoCLOPramide (REGLAN) 10 MG tablet Take 1 tablet (10 mg total) by mouth every 6 (six) hours as needed for nausea. 05/13/20   Couture, Cortni S, PA-C  SUMAtriptan (IMITREX) 50 MG tablet Take 1 tablet (50 mg total) by mouth every 2 (two) hours as needed for migraine. May repeat in 2 hours if headache persists or recurs. 06/23/20   Jearld Fenton, NP  venlafaxine XR (EFFEXOR-XR) 75 MG 24 hr capsule TAKE 1 CAPSULE BY MOUTH DAILY WITH BREAKFAST. 05/24/20   Jearld Fenton, NP    Allergies Nsaids   REVIEW OF SYSTEMS  Negative except as noted here or in the History of Present Illness.   PHYSICAL EXAMINATION   Initial Vital Signs Blood pressure (!) 134/103, pulse 94, temperature 97.8 F (36.6 C), temperature source Oral, resp. rate 18, height 5\' 9"  (1.753 m), weight 86.2 kg, last menstrual period 07/12/2011, SpO2 99 %.  Examination General: Well-developed, well-nourished female in no acute distress; appearance consistent with age of record HENT: normocephalic; atraumatic Eyes: pupils equal, round and reactive to light; extraocular muscles intact Neck: supple Heart: regular rate and rhythm Lungs: clear to auscultation bilaterally Chest: Significant bilateral parasternal tenderness Abdomen: soft; nondistended; nontender; bowel sounds present Extremities: No deformity; full range of motion; pulses normal; no edema or calf tenderness Neurologic: Awake, alert and oriented; motor function intact in all extremities and symmetric; no facial droop Skin: Warm and dry Psychiatric: Normal mood and affect   RESULTS  Summary of this visit's results, reviewed and interpreted by myself:   EKG Interpretation  Date/Time:    Ventricular Rate:    PR Interval:    QRS Duration:   QT Interval:    QTC Calculation:   R Axis:     Text Interpretation:        Laboratory Studies: Results for orders placed or performed during the hospital encounter of 07/25/20 (from the past 24 hour(s))  CBC with Differential/Platelet     Status: None   Collection Time: 07/25/20  4:08 AM  Result Value Ref Range   WBC 7.5 4.0 - 10.5 K/uL   RBC 4.31 3.87 - 5.11 MIL/uL   Hemoglobin 13.0 12.0 - 15.0 g/dL   HCT 38.7 36.0 - 46.0 %   MCV 89.8 80.0 - 100.0 fL   MCH 30.2 26.0 - 34.0 pg   MCHC 33.6 30.0 - 36.0 g/dL   RDW 13.0 11.5 - 15.5 %   Platelets 236 150 - 400 K/uL   nRBC 0.0 0.0 - 0.2 %   Neutrophils Relative % 75 %   Neutro Abs 5.6 1.7 - 7.7 K/uL   Lymphocytes Relative 18 %   Lymphs Abs 1.4 0.7 - 4.0 K/uL   Monocytes Relative 6 %   Monocytes Absolute 0.4 0.1 - 1.0 K/uL   Eosinophils Relative 0 %   Eosinophils  Absolute 0.0 0.0 - 0.5 K/uL   Basophils Relative 1 %   Basophils Absolute 0.0 0.0 - 0.1 K/uL   Immature Granulocytes 0 %   Abs Immature Granulocytes 0.02 0.00 - 0.07 K/uL  Basic metabolic panel     Status: Abnormal   Collection Time: 07/25/20  4:08 AM  Result Value Ref Range   Sodium 135 135 - 145 mmol/L   Potassium 3.3 (  L) 3.5 - 5.1 mmol/L   Chloride 99 98 - 111 mmol/L   CO2 25 22 - 32 mmol/L   Glucose, Bld 97 70 - 99 mg/dL   BUN 16 6 - 20 mg/dL   Creatinine, Ser 0.71 0.44 - 1.00 mg/dL   Calcium 8.6 (L) 8.9 - 10.3 mg/dL   GFR, Estimated >60 >60 mL/min   Anion gap 11 5 - 15   Imaging Studies: CT Angio Chest PE W and/or Wo Contrast  Result Date: 07/25/2020 CLINICAL DATA:  45 year old female with chest pain for 1 month. Cough. Shortness of breath. Several negative tests for COVID-19. EXAM: CT ANGIOGRAPHY CHEST WITH CONTRAST TECHNIQUE: Multidetector CT imaging of the chest was performed using the standard protocol during bolus administration of intravenous contrast. Multiplanar CT image reconstructions and MIPs were obtained to evaluate the vascular anatomy. CONTRAST:  131mL OMNIPAQUE IOHEXOL 350 MG/ML SOLN COMPARISON:  Chest radiograph 02/08/2020. CT Abdomen and Pelvis 09/02/2015. FINDINGS: Cardiovascular: Adequate contrast bolus timing in the pulmonary arterial tree. No pulmonary arterial filling defect. Cardiac size at the upper limits of normal. No pericardial effusion. Visible aorta is opacified and negative. No calcified coronary artery atherosclerosis is evident. Mediastinum/Nodes: Negative.  No mediastinal lymphadenopathy. Lungs/Pleura: Major airways are patent. Low normal lung volumes. Aside from minimal dependent atelectasis both lungs appear clear. No pleural effusion. Upper Abdomen: Negative visible liver, gallbladder, spleen, pancreas, adrenal glands and left kidney. Prior gastric bypass with gastrojejunostomy. No adverse features in the visible proximal bowel. The visible transverse  colon is gas distended, but decompressed at the splenic flexure where some diverticulosis is noted. Musculoskeletal: Negative. Review of the MIP images confirms the above findings. IMPRESSION: 1. Negative for pulmonary embolus.  Negative CTA chest. 2. Prior gastric bypass with gastrojejunostomy. Electronically Signed   By: Genevie Ann M.D.   On: 07/25/2020 05:03    ED COURSE and MDM  Nursing notes, initial and subsequent vitals signs, including pulse oximetry, reviewed and interpreted by myself.  Vitals:   07/25/20 0303 07/25/20 0306  BP:  (!) 134/103  Pulse:  94  Resp:  18  Temp:  97.8 F (36.6 C)  TempSrc:  Oral  SpO2:  99%  Weight: 86.2 kg   Height: 5\' 9"  (1.753 m)    Medications  iohexol (OMNIPAQUE) 350 MG/ML injection 100 mL (100 mLs Intravenous Contrast Given 07/25/20 0448)   Patient's presentation is consistent with chest wall pain, possibly costochondritis given the tenderness of her sternocostal junctions to palpation.  She cannot take NSAIDs due to previous gastric bypass surgery.  The cause of this pain is unclear but may be due to her persistent cough.  The cause of the cough is unclear if she is not on an ACE inhibitor and has tested negative for COVID.   PROCEDURES  Procedures   ED DIAGNOSES     ICD-10-CM   1. Chest wall pain  R07.89        Shanon Rosser, MD 07/25/20 [id number]

## 2020-07-25 NOTE — ED Triage Notes (Signed)
Pt states she has been having chest pain x 1 month  Pt states she has a cough and has been coughing up some blood  Pt states she has been having shortness of breath  Pt states she has had 3 covid tests that have all come back negative  Pt states it literally hurts to breathe  Pt states she has been having cramping in her right leg as well

## 2020-07-31 ENCOUNTER — Other Ambulatory Visit: Payer: Self-pay

## 2020-08-01 ENCOUNTER — Other Ambulatory Visit: Payer: Self-pay | Admitting: Internal Medicine

## 2020-08-01 NOTE — Telephone Encounter (Signed)
Last filled 07/02/2020.... please advise 

## 2020-08-01 NOTE — Telephone Encounter (Signed)
Already forwarded for approval, duplicate request

## 2020-08-03 MED ORDER — ALPRAZOLAM 0.5 MG PO TABS: 0.5000 mg | ORAL_TABLET | Freq: Every day | ORAL | 0 refills | Status: DC | PRN

## 2020-08-04 ENCOUNTER — Ambulatory Visit: Payer: Federal, State, Local not specified - PPO | Admitting: Internal Medicine

## 2020-08-05 ENCOUNTER — Other Ambulatory Visit: Payer: Self-pay | Admitting: Internal Medicine

## 2020-08-09 ENCOUNTER — Telehealth: Payer: Federal, State, Local not specified - PPO | Admitting: Nurse Practitioner

## 2020-08-09 DIAGNOSIS — M545 Low back pain, unspecified: Secondary | ICD-10-CM

## 2020-08-09 DIAGNOSIS — G8929 Other chronic pain: Secondary | ICD-10-CM

## 2020-08-09 DIAGNOSIS — Z760 Encounter for issue of repeat prescription: Secondary | ICD-10-CM

## 2020-08-09 MED ORDER — GABAPENTIN 300 MG PO CAPS
300.0000 mg | ORAL_CAPSULE | Freq: Two times a day (BID) | ORAL | 0 refills | Status: DC
Start: 2020-08-09 — End: 2021-02-18

## 2020-08-09 NOTE — Progress Notes (Signed)
E-Visits are not used to request refills.  After reviewing your records, I can verify that you may be running out of a long term medication before your next scheduled appointment.  Based on this information,  I can refill your (free text) on a one time basis.  Please contact your doctor as soon as possible to manage your prescription.  Meds ordered this encounter  Medications  . gabapentin (NEURONTIN) 300 MG capsule    Sig: Take 1 capsule (300 mg total) by mouth 2 (two) times daily.    Dispense:  60 capsule    Refill:  0    Order Specific Question:   Supervising Provider    Answer:   MILLER, BRIAN [[id number]]   5-10 minutes spent reviewing and documenting in chart.

## 2020-08-09 NOTE — Progress Notes (Signed)
E-Visits are not used to request refills.  After reviewing your records,, the propranolol is not on your current list of medications. You will need ot contact your PCP for refilll of that.

## 2020-08-12 ENCOUNTER — Emergency Department (HOSPITAL_BASED_OUTPATIENT_CLINIC_OR_DEPARTMENT_OTHER)
Admission: EM | Admit: 2020-08-12 | Discharge: 2020-08-12 | Disposition: A | Discharge status: Home / Self Care | Payer: Federal, State, Local not specified - PPO | Attending: Emergency Medicine | Admitting: Emergency Medicine

## 2020-08-12 ENCOUNTER — Encounter: Payer: Self-pay | Admitting: Internal Medicine

## 2020-08-12 ENCOUNTER — Encounter (HOSPITAL_BASED_OUTPATIENT_CLINIC_OR_DEPARTMENT_OTHER): Payer: Self-pay | Admitting: *Deleted

## 2020-08-12 ENCOUNTER — Other Ambulatory Visit: Payer: Self-pay

## 2020-08-12 ENCOUNTER — Emergency Department (HOSPITAL_BASED_OUTPATIENT_CLINIC_OR_DEPARTMENT_OTHER): Payer: Federal, State, Local not specified - PPO

## 2020-08-12 DIAGNOSIS — J45909 Unspecified asthma, uncomplicated: Secondary | ICD-10-CM | POA: Insufficient documentation

## 2020-08-12 DIAGNOSIS — Z79899 Other long term (current) drug therapy: Secondary | ICD-10-CM | POA: Diagnosis not present

## 2020-08-12 DIAGNOSIS — M79604 Pain in right leg: Secondary | ICD-10-CM | POA: Diagnosis not present

## 2020-08-12 DIAGNOSIS — M79661 Pain in right lower leg: Secondary | ICD-10-CM | POA: Diagnosis not present

## 2020-08-12 DIAGNOSIS — I10 Essential (primary) hypertension: Secondary | ICD-10-CM | POA: Diagnosis not present

## 2020-08-12 DIAGNOSIS — I82401 Acute embolism and thrombosis of unspecified deep veins of right lower extremity: Secondary | ICD-10-CM | POA: Diagnosis not present

## 2020-08-12 LAB — CBC WITH DIFFERENTIAL/PLATELET
Abs Immature Granulocytes: 0.02 10*3/uL (ref 0.00–0.07)
Basophils Absolute: 0.1 10*3/uL (ref 0.0–0.1)
Basophils Relative: 1 %
Eosinophils Absolute: 0 10*3/uL (ref 0.0–0.5)
Eosinophils Relative: 1 %
HCT: 39.5 % (ref 36.0–46.0)
Hemoglobin: 13.3 g/dL (ref 12.0–15.0)
Immature Granulocytes: 0 %
Lymphocytes Relative: 30 %
Lymphs Abs: 2.5 10*3/uL (ref 0.7–4.0)
MCH: 30.3 pg (ref 26.0–34.0)
MCHC: 33.7 g/dL (ref 30.0–36.0)
MCV: 90 fL (ref 80.0–100.0)
Monocytes Absolute: 0.5 10*3/uL (ref 0.1–1.0)
Monocytes Relative: 6 %
Neutro Abs: 5.3 10*3/uL (ref 1.7–7.7)
Neutrophils Relative %: 62 %
Platelets: 301 10*3/uL (ref 150–400)
RBC: 4.39 MIL/uL (ref 3.87–5.11)
RDW: 13.2 % (ref 11.5–15.5)
WBC: 8.4 10*3/uL (ref 4.0–10.5)
nRBC: 0 % (ref 0.0–0.2)

## 2020-08-12 LAB — COMPREHENSIVE METABOLIC PANEL
ALT: 18 U/L (ref 0–44)
AST: 24 U/L (ref 15–41)
Albumin: 4.2 g/dL (ref 3.5–5.0)
Alkaline Phosphatase: 68 U/L (ref 38–126)
Anion gap: 10 (ref 5–15)
BUN: 13 mg/dL (ref 6–20)
CO2: 24 mmol/L (ref 22–32)
Calcium: 8.7 mg/dL — ABNORMAL LOW (ref 8.9–10.3)
Chloride: 101 mmol/L (ref 98–111)
Creatinine, Ser: 0.83 mg/dL (ref 0.44–1.00)
GFR, Estimated: >60 mL/min (ref >60)
Glucose, Bld: 93 mg/dL (ref 70–99)
Potassium: 3.7 mmol/L (ref 3.5–5.1)
Sodium: 135 mmol/L (ref 135–145)
Total Bilirubin: 0.3 mg/dL (ref 0.3–1.2)
Total Protein: 7.6 g/dL (ref 6.5–8.1)

## 2020-08-12 NOTE — ED Notes (Signed)
PA aware of PT and possible need for [place]

## 2020-08-12 NOTE — ED Notes (Addendum)
Pt denies blood thinners, denies recent travel, denies injury, denies shob. Tenderness noted to calf.

## 2020-08-12 NOTE — Discharge Instructions (Addendum)
You are seen today for right leg pain and bruising, as we discussed your work-up was reassuring.  Your DVT study was negative.  I want you to take Tylenol as directed on the bottle for pain, keep an eye on this and if your symptoms get worse or if you have any new worsening concerning symptoms please come back to the emergency department.  Please keep your appointment with your primary care provider this week for follow-up.  If you feel as if your leg is turning blue, you cannot walk, or you have other concerning symptoms please come back to the emergency department. Use the instructions.

## 2020-08-12 NOTE — ED Notes (Signed)
ED Provider at bedside. 

## 2020-08-12 NOTE — ED Provider Notes (Signed)
[facility] [facility] EMERGENCY DEPARTMENT Provider Note   CSN: [id number] Arrival date & time: 08/12/20  1457     History Chief Complaint  Patient presents with  . Leg Pain    Linda [name] is a 45 y.o. female with pertinent past medical history of anxiety, GERD, hypertension, asthma that presents to the emergency department today for calf pain.  Patient states that she started having calf pain on the right side for the past 2 days.  Denies any trauma.  States that her entire calf is tender, did admit to some swelling noted into her ankle, has gone down.  States that her pain feels as if it is a burning sensation.  Denies any numbness or tingling.  Denies any recent travel, estrogen use, history of cancer, recent surgery, history of coagulation disorders.  Denies any blood thinners.  Patient states that she has tried taking Tylenol without any relief.  Patient was seen in urgent care, told to come here for further evaluation, rule out DVT.  Patient denies any chest pain or shortness of breath.  Has been able to ambulate on leg.  Denies any fevers, chills, nausea, vomiting.  Denies any hip pain or knee pain, no other complaints at this time.  HPI     Past Medical History:  Diagnosis Date  . Alcohol abuse   . Anxiety   . Asthma   . GERD (gastroesophageal reflux disease)   . Heart murmur    while pregnant  . Hypertension   . Migraine     Patient Active Problem List   Diagnosis Date Noted  . HTN (hypertension) 05/01/2020  . Complicated migraine [id number]  . GERD (gastroesophageal reflux disease) 01/03/2017  . Seasonal allergies 01/03/2017  . Anxiety and depression 04/15/2014    Past Surgical History:  Procedure Laterality Date  . ABDOMINAL HYSTERECTOMY  12/20012   Total  . ENDOMETRIAL ABLATION  2012  . GASTRIC ROUX-EN-Y N/A 05/18/2016   Procedure: LAPAROSCOPIC ROUX-EN-Y GASTRIC WITH UPPER ENDO;  Surgeon: Arta Bruce Kinsinger, MD;  Location: [facility];  Service: General;   Laterality: N/A;  . MYRINGOTOMY WITH TUBE PLACEMENT     X 3 SINCE ORRIGINALLY PLACE IN 2010  . TONSILECTOMY, ADENOIDECTOMY, BILATERAL MYRINGOTOMY AND TUBES  2010  . TONSILLECTOMY    . TUBAL LIGATION  08/2010  . UPPER GI ENDOSCOPY  05/18/2016   Procedure: UPPER GI ENDOSCOPY;  Surgeon: Arta Bruce Kinsinger, MD;  Location: [facility];  Service: General;;     OB History   No obstetric history on file.     Family History  Problem Relation Age of Onset  . Hypertension Father   . Diabetes Father   . Hypertension Mother   . Cancer Mother        uterine  . Stroke Mother   . Diabetes Maternal Aunt   . Diabetes Maternal Uncle   . Cancer Maternal Grandmother        PANCREATIC  . Hypertension Maternal Grandmother   . Diabetes Maternal Grandmother   . Cancer Maternal Grandfather        LUNG  . Hypertension Maternal Grandfather   . Diabetes Maternal Grandfather   . Cancer Paternal Grandmother        THROAT  . Alzheimer's disease Paternal Grandmother     Social History   Tobacco Use  . Smoking status: Never Smoker  . Smokeless tobacco: Never Used  Vaping Use  . Vaping Use: Never used  Substance Use Topics  . Alcohol  use: Yes    Comment: occ  . Drug use: No    Home Medications Prior to Admission medications   Medication Sig Start Date End Date Taking? Authorizing Provider  albuterol (VENTOLIN HFA) 108 (90 Base) MCG/ACT inhaler Inhale 2 puffs into the lungs every 6 (six) hours as needed for wheezing or shortness of breath. 09/13/19   Jearld Fenton, NP  ALPRAZolam Duanne Moron) 0.5 MG tablet Take 1 tablet (0.5 mg total) by mouth daily as needed for anxiety. 08/03/20   Jearld Fenton, NP  amLODipine (NORVASC) 5 MG tablet TAKE 1 TABLET BY MOUTH EVERY DAY 08/07/20   Jearld Fenton, NP  buPROPion (WELLBUTRIN XL) 300 MG 24 hr tablet Take 1 tablet (300 mg total) by mouth daily. 04/17/20   Jearld Fenton, NP  cyclobenzaprine (FLEXERIL) 10 MG tablet Take 1 tablet (10 mg total) by mouth 3 (three)  times daily as needed for muscle spasms. 06/20/20   Hassell Done, Mary-Margaret, FNP  gabapentin (NEURONTIN) 300 MG capsule Take 1 capsule (300 mg total) by mouth 2 (two) times daily. 08/09/20 09/08/20  Chevis Pretty, FNP  metoCLOPramide (REGLAN) 10 MG tablet Take 1 tablet (10 mg total) by mouth every 6 (six) hours as needed for nausea. 05/13/20   Couture, Cortni S, PA-C  SUMAtriptan (IMITREX) 50 MG tablet Take 1 tablet (50 mg total) by mouth every 2 (two) hours as needed for migraine. May repeat in 2 hours if headache persists or recurs. 06/23/20   Jearld Fenton, NP  venlafaxine XR (EFFEXOR-XR) 75 MG 24 hr capsule TAKE 1 CAPSULE BY MOUTH DAILY WITH BREAKFAST. 05/24/20   Jearld Fenton, NP    Allergies    Nsaids  Review of Systems   Review of Systems  Constitutional: Negative for chills, diaphoresis, fatigue and fever.  HENT: Negative for congestion, sore throat and trouble swallowing.   Eyes: Negative for pain and visual disturbance.  Respiratory: Negative for cough, shortness of breath and wheezing.   Cardiovascular: Negative for chest pain, palpitations and leg swelling.  Gastrointestinal: Negative for abdominal distention, abdominal pain, diarrhea, nausea and vomiting.  Genitourinary: Negative for difficulty urinating.  Musculoskeletal: Positive for myalgias. Negative for arthralgias, back pain, neck pain and neck stiffness.  Skin: Negative for pallor.  Neurological: Negative for dizziness, speech difficulty, weakness and headaches.  Psychiatric/Behavioral: Negative for confusion.    Physical Exam Updated Vital Signs BP 134/87   Pulse 78   Temp 98.2 F (36.8 C) (Oral)   Resp 18   Ht 5\' 9"  (1.753 m)   Wt 89.8 kg   LMP 07/12/2011   SpO2 100%   BMI 29.24 kg/m   Physical Exam Constitutional:      General: She is not in acute distress.    Appearance: Normal appearance. She is not ill-appearing, toxic-appearing or diaphoretic.  HENT:     Mouth/Throat:     Mouth: Mucous  membranes are moist.     Pharynx: Oropharynx is clear.  Eyes:     General: No scleral icterus.    Extraocular Movements: Extraocular movements intact.     Pupils: Pupils are equal, round, and reactive to light.  Cardiovascular:     Rate and Rhythm: Normal rate and regular rhythm.     Pulses: Normal pulses.     Heart sounds: Normal heart sounds.  Pulmonary:     Effort: Pulmonary effort is normal. No respiratory distress.     Breath sounds: Normal breath sounds. No stridor. No wheezing, rhonchi or rales.  Chest:     Chest wall: No tenderness.  Abdominal:     General: Abdomen is flat. There is no distension.     Palpations: Abdomen is soft.     Tenderness: There is no abdominal tenderness. There is no guarding or rebound.  Musculoskeletal:        General: No swelling or tenderness. Normal range of motion.     Cervical back: Normal range of motion and neck supple. No rigidity.     Right lower leg: No edema.     Left lower leg: No edema.       Legs:     Comments: Patient with tenderness throughout right leg, more specifically on right calf.  Compartments are soft.  No erythema or warmth or rashes noted.  No  swelling noted.  Ankle without any swelling.  PT pulses 2+ and equal.  Normal strength to foot.  Normal leg raise.  Normal sensation throughout.  No true hip pain or knee pain.  Skin:    General: Skin is warm and dry.     Capillary Refill: Capillary refill takes less than 2 seconds.     Coloration: Skin is not pale.  Neurological:     General: No focal deficit present.     Mental Status: She is alert and oriented to person, place, and time.  Psychiatric:        Mood and Affect: Mood normal.        Behavior: Behavior normal.     ED Results / Procedures / Treatments   Labs (all labs ordered are listed, but only abnormal results are displayed) Labs Reviewed  COMPREHENSIVE METABOLIC PANEL - Abnormal; Notable for the following components:      Result Value   Calcium 8.7 (*)     All other components within normal limits  CBC WITH DIFFERENTIAL/PLATELET    EKG None  Radiology US Venous Img Lower Right (DVT Study)  Result Date: 08/12/2020 CLINICAL DATA:  Calf pain EXAM: RIGHT LOWER EXTREMITY VENOUS DOPPLER ULTRASOUND TECHNIQUE: Gray-scale sonography with compression, as well as color and duplex ultrasound, were performed to evaluate the deep venous system(s) from the level of the common femoral vein through the popliteal and proximal calf veins. COMPARISON:  None. FINDINGS: VENOUS Normal compressibility of the common femoral, superficial femoral, and popliteal veins, as well as the visualized calf veins. Visualized portions of profunda femoral vein and great saphenous vein unremarkable. No filling defects to suggest DVT on grayscale or color Doppler imaging. Doppler waveforms show normal direction of venous flow, normal respiratory plasticity and response to augmentation. Limited views of the contralateral common femoral vein are unremarkable. OTHER None. Limitations: none IMPRESSION: Negative. Electronically Signed   By: Constance Holster M.D.   On: 08/12/2020 16:34    Procedures Procedures   Medications Ordered in ED Medications - No data to display  ED Course  I have reviewed the triage vital signs and the nursing notes.  Pertinent labs & imaging results that were available during my care of the patient were reviewed by me and considered in my medical decision making (see chart for details).    MDM Rules/Calculators/A&P                          Linda [name] is a 45 y.o. female with pertinent past medical history of anxiety, GERD, hypertension, asthma that presents to the emergency department today for calf pain.  Nontraumatic.  Tenderness is noted to  right calf, is distally neurovascularly intact. No concerns of sepsis or cellulitis. Vital signs stable.  No concerns for deep bone infection.  Patient is able to ambulate normally.  Will obtain basic labs and  DVT study at this time.  Work-up today negative, DVT study negative.  Unsure why patient is having pain, does not appear as if this is a muscle spasm, could be a muscle strain.  Did discuss this with patient, patient does have appointment with PCP this week.  Symptomatic treatment discussed, patient to be discharged at this time.  Doubt need for further emergent work up at this time. I explained the diagnosis and have given explicit precautions to return to the ER including for any other new or worsening symptoms. The patient understands and accepts the medical plan as it's been dictated and I have answered their questions. Discharge instructions concerning home care and prescriptions have been given. The patient is STABLE and is discharged to home in good condition.   Final Clinical Impression(s) / ED Diagnoses Final diagnoses:  Right leg pain    Rx / DC Orders ED Discharge Orders    None       Alfredia Client, PA-C 08/12/20 1703    Wyvonnia Dusky, MD 08/13/20 1026

## 2020-08-12 NOTE — ED Triage Notes (Signed)
Pt sent here by PMD for r/o DVT , c/o right calf swelling and pain , denies injury

## 2020-08-12 NOTE — Telephone Encounter (Signed)
Agree with ED eval

## 2020-08-12 NOTE — Telephone Encounter (Signed)
I spoke with pt and she is registering at [facility] in [place] now. Pt said she would pt message Melanie CMA after UC visit.

## 2020-08-12 NOTE — Telephone Encounter (Signed)
[place] [facility] - Client TELEPHONE ADVICE RECORD [facility] Patient Name: Tanaysha Madonna Gender: Female DOB: 1976-01-19 Age: 45 Y 23 M 66 D Return Phone Number: [phone] (Primary), [phone] (Secondary) Address: [place] [address] Client [facility] - Client Client Site [place] - Day Physician Webb Silversmith - NP Contact Type Call Who Is Calling Patient / Member / Family / Caregiver Call Type Triage / Clinical Relationship To Patient Self Return Phone Number [phone] (Primary) Chief Complaint Leg Swelling And Edema Reason for Call Symptomatic / Request for [place] states she is Raquel Sarna from [facility] calling for a pt to be triaged. Pt states she was getting a lot of bruising from anything. Pt had some leg in her pain, leg swollen,knot on her leg that is soft and moveable, and discolored. Translation No Nurse Assessment Nurse: Sumner Boast, RN, Enid Derry Date/Time [name] Time): 08/12/2020 11:00:26 AM Confirm and document reason for call. If symptomatic, describe symptoms. ---Caller states she is bruising a lot. States she has right leg pain. Leg is swollen and has a knot on her leg that is soft, moveable, and discolored. The knot feels hot and is located on the front side of her leg. No fever. Pain is 5 out of 10 pain scale. It is tingling and feels numb. Does the patient have any new or worsening symptoms? ---Yes Will a triage be completed? ---Yes Related visit to physician within the last 2 weeks? ---No Does the PT have any chronic conditions? (i.e. diabetes, asthma, this includes High risk factors for pregnancy, etc.) ---Yes List chronic conditions. ---Hypertension Asthma Is the patient pregnant or possibly pregnant? (Ask all females between the ages of 87-55) ---No Is this a behavioral health or substance abuse call?  ---No Guidelines Guideline Title Affirmed Question Affirmed Notes Nurse Date/Time [name] Time) Leg Swelling and Edema [1] Thigh or calf pain AND [2] only 1 side AND [3] present > 1 hour [place], RN, Enid Derry 08/12/2020 11:06:05 AM Disp. Time [name] Time) Disposition Final User PLEASE NOTE: All timestamps contained within this report are represented as [place] Standard Time. CONFIDENTIALTY NOTICE: This fax transmission is intended only for the addressee. It contains information that is legally privileged, confidential or otherwise protected from use or disclosure. If you are not the intended recipient, you are strictly prohibited from reviewing, disclosing, copying using or disseminating any of this information or taking any action in reliance on or regarding this information. If you have received this fax in error, please notify us immediately by telephone so that we can arrange for its return to [place]. Phone: [phone], Toll-Free: [phone], Fax: [phone] Page: 2 of 2 Call Id: [id number] 08/12/2020 11:13:02 AM See HCP within 4 Hours (or PCP triage) Yes Sumner Boast, RN, Teola Bradley Disagree/Comply Comply Caller Understands Yes PreDisposition Call Doctor Care Advice Given Per Guideline SEE HCP (OR PCP TRIAGE) WITHIN 4 HOURS: CARE ADVICE given per Leg Swelling and Edema (Adult) guideline. Referrals GO TO FACILITY UNDECIDED

## 2020-08-18 ENCOUNTER — Inpatient Hospital Stay: Payer: Federal, State, Local not specified - PPO | Admitting: Internal Medicine

## 2020-08-29 ENCOUNTER — Other Ambulatory Visit: Payer: Self-pay | Admitting: Internal Medicine

## 2020-08-29 NOTE — Telephone Encounter (Signed)
Xanax last filled 08/03/2020... note To be filled on or after 09/02/2020...Marland Kitchen please advise

## 2020-09-10 ENCOUNTER — Other Ambulatory Visit: Payer: Self-pay | Admitting: Internal Medicine

## 2020-09-28 ENCOUNTER — Other Ambulatory Visit: Payer: Self-pay | Admitting: Internal Medicine

## 2020-09-29 ENCOUNTER — Other Ambulatory Visit: Payer: Self-pay | Admitting: Internal Medicine

## 2020-09-30 MED ORDER — ALPRAZOLAM 0.5 MG PO TABS: 0.5000 mg | ORAL_TABLET | Freq: Every day | ORAL | 0 refills | Status: DC | PRN

## 2020-09-30 NOTE — Telephone Encounter (Signed)
Last filled 09/02/2020... please advise

## 2020-10-30 ENCOUNTER — Other Ambulatory Visit: Payer: Self-pay | Admitting: Internal Medicine

## 2020-10-30 NOTE — Telephone Encounter (Signed)
Pt states she is moving with you to [place].... Rx last filled 09/30/2020... please advise

## 2020-10-30 NOTE — Telephone Encounter (Signed)
Last filled 06/23/2020 with 5 refills...Marland Kitchen please advise

## 2020-11-01 MED ORDER — ALPRAZOLAM 0.5 MG PO TABS: 0.5000 mg | ORAL_TABLET | Freq: Every day | ORAL | 0 refills | Status: DC | PRN

## 2020-11-01 NOTE — Telephone Encounter (Signed)
She should not need a refill until June. How often if she needing to take the Imitrex?

## 2020-11-05 ENCOUNTER — Telehealth: Payer: Federal, State, Local not specified - PPO | Admitting: Physician Assistant

## 2020-11-05 DIAGNOSIS — R112 Nausea with vomiting, unspecified: Secondary | ICD-10-CM | POA: Diagnosis not present

## 2020-11-05 MED ORDER — PROMETHAZINE HCL 25 MG PO TABS: 25.0000 mg | ORAL_TABLET | Freq: Three times a day (TID) | ORAL | 0 refills | Status: DC | PRN

## 2020-11-05 NOTE — Progress Notes (Signed)
We are sorry that you are not feeling well. Here is how we plan to help!  Although nausea and vomiting can make you feel miserable, it's important to remember that these are not diseases, but rather symptoms of an underlying illness.  When we treat short term symptoms, we always caution that any symptoms that persist should be fully evaluated in a medical office.  I have prescribed a medication that will help alleviate your symptoms and allow you to stay hydrated:  Promethazine 25 mg take 1 tablet twice daily  HOME CARE:  Drink clear liquids.  This is very important! Dehydration (the lack of fluid) can lead to a serious complication.  Start off with 1 tablespoon every 5 minutes for 8 hours.  You may begin eating bland foods after 8 hours without vomiting.  Start with saltine crackers, white bread, rice, mashed potatoes, applesauce.  After 48 hours on a bland diet, you may resume a normal diet.  Try to go to sleep.  Sleep often empties the stomach and relieves the need to vomit.  GET HELP RIGHT AWAY IF:   Your symptoms do not improve or worsen within 2 days after treatment.  You have a fever for over 3 days.  You cannot keep down fluids after trying the medication.  MAKE SURE YOU:   Understand these instructions.  Will watch your condition.  Will get help right away if you are not doing well or get worse.   Thank you for choosing an e-visit. Your e-visit answers were reviewed by a board certified advanced clinical practitioner to complete your personal care plan. Depending upon the condition, your plan could have included both over the counter or prescription medications. Please review your pharmacy choice. Be sure that the pharmacy you have chosen is open so that you can pick up your prescription now.  If there is a problem you may message your provider in [place] to have the prescription routed to another pharmacy. Your safety is important to [place]. If you have drug allergies  check your prescription carefully.  For the next 24 hours, you can use MyChart to ask questions about today's visit, request a non-urgent call back, or ask for a work or school excuse from your e-visit provider. You will get an e-mail in the next two days asking about your experience. I hope that your e-visit has been valuable and will speed your recovery.  I provided 5 minutes of non face-to-face time during this encounter for chart review and documentation.

## 2020-11-13 MED ORDER — SUMATRIPTAN SUCCINATE 50 MG PO TABS: 50.0000 mg | ORAL_TABLET | ORAL | 5 refills | Status: DC | PRN

## 2020-11-13 NOTE — Telephone Encounter (Signed)
refilled 

## 2020-11-13 NOTE — Addendum Note (Signed)
Addended by: Jearld Fenton on: 11/13/2020 10:46 AM   Modules accepted: Orders

## 2020-11-26 ENCOUNTER — Ambulatory Visit: Payer: Federal, State, Local not specified - PPO | Admitting: Internal Medicine

## 2020-11-26 ENCOUNTER — Encounter: Payer: Self-pay | Admitting: Internal Medicine

## 2020-11-26 ENCOUNTER — Other Ambulatory Visit: Payer: Self-pay

## 2020-11-26 DIAGNOSIS — F900 Attention-deficit hyperactivity disorder, predominantly inattentive type: Secondary | ICD-10-CM

## 2020-11-26 DIAGNOSIS — F32A Depression, unspecified: Secondary | ICD-10-CM

## 2020-11-26 DIAGNOSIS — K219 Gastro-esophageal reflux disease without esophagitis: Secondary | ICD-10-CM | POA: Diagnosis not present

## 2020-11-26 DIAGNOSIS — F419 Anxiety disorder, unspecified: Secondary | ICD-10-CM

## 2020-11-26 DIAGNOSIS — G43109 Migraine with aura, not intractable, without status migrainosus: Secondary | ICD-10-CM

## 2020-11-26 DIAGNOSIS — I10 Essential (primary) hypertension: Secondary | ICD-10-CM

## 2020-11-26 DIAGNOSIS — F909 Attention-deficit hyperactivity disorder, unspecified type: Secondary | ICD-10-CM | POA: Insufficient documentation

## 2020-11-26 MED ORDER — ALPRAZOLAM 0.5 MG PO TABS: 0.5000 mg | ORAL_TABLET | Freq: Every day | ORAL | 0 refills | Status: DC | PRN

## 2020-11-26 MED ORDER — SUMATRIPTAN SUCCINATE 100 MG PO TABS: 100.0000 mg | ORAL_TABLET | ORAL | 5 refills | Status: DC | PRN

## 2020-11-26 MED ORDER — AMPHETAMINE-DEXTROAMPHET ER 10 MG PO CP24: 10.0000 mg | ORAL_CAPSULE | Freq: Every day | ORAL | 0 refills | Status: DC

## 2020-11-26 NOTE — Assessment & Plan Note (Signed)
Stable on Xanax as needed Encouraged her to keep meeting with her therapist Support offered CSA today

## 2020-11-26 NOTE — Progress Notes (Signed)
Subjective:    Patient ID: Linda [name], female    DOB: December 24, 1975, 45 y.o.   MRN: [id number]  HPI  Patient presents the clinic today for follow-up of chronic conditions.  Migraines: These occur 3-4 times per month.  She takes Imitrex and Phenergan as needed with some relief of symptoms, does not feel like it is effective as is used to.  She is not following with neurology.  HTN: Her BP today is 128/85.  She is taking Amlodipine as prescribed.  ECG from 01/2020 reviewed.  GERD: Status post gastric sleeve.  She takes Zantac OTC as needed with good relief. There is no upper GI on file.  Anxiety and Depression: Chronic, managed on Xanax as needed.  She is currently seeing a therapist which she feels like has helped tremendously.  She denies SI/HI.  She does have some concerns about ADHD.  She reports she was diagnosed with this in her early teens and treated with Focalin.  She reports she had a recent job review and was advised that she was having difficulty completing tasks.  She is having difficulty getting organized and staying on track.  She thinks she may need medication for ADHD at this time.  Review of Systems      Past Medical History:  Diagnosis Date  . Alcohol abuse   . Anxiety   . Asthma   . GERD (gastroesophageal reflux disease)   . Heart murmur    while pregnant  . Hypertension   . Migraine     Current Outpatient Medications  Medication Sig Dispense Refill  . albuterol (VENTOLIN HFA) 108 (90 Base) MCG/ACT inhaler Inhale 2 puffs into the lungs every 6 (six) hours as needed for wheezing or shortness of breath. 18 g 0  . ALPRAZolam (XANAX) 0.5 MG tablet Take 1 tablet (0.5 mg total) by mouth daily as needed for anxiety. 20 tablet 0  . amLODipine (NORVASC) 5 MG tablet TAKE 1 TABLET BY MOUTH EVERY DAY 90 tablet 0  . gabapentin (NEURONTIN) 300 MG capsule Take 1 capsule (300 mg total) by mouth 2 (two) times daily. 60 capsule 0  . promethazine (PHENERGAN) 25 MG tablet  Take 1 tablet (25 mg total) by mouth every 8 (eight) hours as needed for nausea or vomiting. 20 tablet 0  . SUMAtriptan (IMITREX) 50 MG tablet Take 1 tablet (50 mg total) by mouth every 2 (two) hours as needed for migraine. May repeat in 2 hours if headache persists or recurs. 10 tablet 5  . venlafaxine XR (EFFEXOR-XR) 75 MG 24 hr capsule TAKE 1 CAPSULE BY MOUTH DAILY WITH BREAKFAST. 90 capsule 1   No current facility-administered medications for this visit.    Allergies  Allergen Reactions  . Nsaids Other (See Comments)    Family History  Problem Relation Age of Onset  . Hypertension Father   . Diabetes Father   . Hypertension Mother   . Cancer Mother        uterine  . Stroke Mother   . Diabetes Maternal Aunt   . Diabetes Maternal Uncle   . Cancer Maternal Grandmother        PANCREATIC  . Hypertension Maternal Grandmother   . Diabetes Maternal Grandmother   . Cancer Maternal Grandfather        LUNG  . Hypertension Maternal Grandfather   . Diabetes Maternal Grandfather   . Cancer Paternal Grandmother        THROAT  . Alzheimer's disease Paternal Grandmother  Social History   Socioeconomic History  . Marital status: Divorced    Spouse name: Not on file  . Number of children: Not on file  . Years of education: Not on file  . Highest education level: Not on file  Occupational History  . Not on file  Tobacco Use  . Smoking status: Never Smoker  . Smokeless tobacco: Never Used  Vaping Use  . Vaping Use: Never used  Substance and Sexual Activity  . Alcohol use: Yes    Comment: occ  . Drug use: No  . Sexual activity: Not on file  Other Topics Concern  . Not on file  Social History Narrative  . Not on file   Social Determinants of Health   Financial Resource Strain: Not on file  Food Insecurity: Not on file  Transportation Needs: Not on file  Physical Activity: Not on file  Stress: Not on file  Social Connections: Not on file  Intimate Partner Violence:  Not on file     Constitutional: Patient reports intermittent headaches.  Denies fever, malaise, fatigue,or abrupt weight changes.  HEENT: Denies eye pain, eye redness, ear pain, ringing in the ears, wax buildup, runny nose, nasal congestion, bloody nose, or sore throat. Respiratory: Denies difficulty breathing, shortness of breath, cough or sputum production.   Cardiovascular: Denies chest pain, chest tightness, palpitations or swelling in the hands or feet.  Gastrointestinal: Patient reports intermittent reflux.  Denies abdominal pain, bloating, constipation, diarrhea or blood in the stool.  GU: Denies urgency, frequency, pain with urination, burning sensation, blood in urine, odor or discharge. Musculoskeletal: Denies decrease in range of motion, difficulty with gait, muscle pain or joint pain and swelling.  Skin: Denies redness, rashes, lesions or ulcercations.  Neurological: Patient reports difficulty getting organized, staying focused, completing task.  Denies dizziness, difficulty with memory, difficulty with speech or problems with balance and coordination.  Psych: Patient has a history of anxiety and depression.  Denies SI/HI.  No other specific complaints in a complete review of systems (except as listed in HPI above).  Objective:   Physical Exam BP 128/85 (BP Location: Left Arm, Patient Position: Sitting, Cuff Size: Normal)   Pulse 67   Temp 97.8 F (36.6 C) (Temporal)   Resp 18   Ht 5\' 9"  (1.753 m)   Wt 200 lb 9.6 oz (91 kg)   LMP 07/12/2011   SpO2 100%   BMI 29.62 kg/m   Wt Readings from Last 3 Encounters:  08/12/20 198 lb (89.8 kg)  07/25/20 190 lb (86.2 kg)  04/30/20 200 lb (90.7 kg)    General: Appears her stated age, well developed, well nourished in NAD. Skin: Warm, dry and intact.  HEENT: Head: normal shape and size; Eyes: sclera white and EOMs intact; Cardiovascular: Normal rate. Pulmonary/Chest: Normal effort. Musculoskeletal:  No difficulty with gait.   Neurological: Alert and oriented.  Psychiatric: Mood and affect normal. Behavior is normal. Judgment and thought content normal.     BMET    Component Value Date/Time   NA 135 08/12/2020 1525   NA 137 05/31/2013 0846   K 3.7 08/12/2020 1525   K 3.8 05/31/2013 0846   CL 101 08/12/2020 1525   CL 106 05/31/2013 0846   CO2 24 08/12/2020 1525   CO2 26 05/31/2013 0846   GLUCOSE 93 08/12/2020 1525   GLUCOSE 93 05/31/2013 0846   BUN 13 08/12/2020 1525   BUN 10 05/31/2013 0846   CREATININE 0.83 08/12/2020 1525   CREATININE 0.76  05/31/2013 0846   CALCIUM 8.7 (L) 08/12/2020 1525   CALCIUM 8.2 (L) 05/31/2013 0846   GFRNONAA >60 08/12/2020 1525   GFRNONAA >60 05/31/2013 0846   GFRAA >60 02/08/2020 0600   GFRAA >60 05/31/2013 0846    Lipid Panel     Component Value Date/Time   CHOL 232 (H) 05/17/2019 1606   TRIG 176.0 (H) 05/17/2019 1606   HDL 102.60 05/17/2019 1606   CHOLHDL 2 05/17/2019 1606   VLDL 35.2 05/17/2019 1606   LDLCALC 94 05/17/2019 1606    CBC    Component Value Date/Time   WBC 8.4 08/12/2020 1525   RBC 4.39 08/12/2020 1525   HGB 13.3 08/12/2020 1525   HGB 13.3 05/31/2013 0846   HCT 39.5 08/12/2020 1525   HCT 39.2 05/31/2013 0846   PLT 301 08/12/2020 1525   PLT 219 05/31/2013 0846   MCV 90.0 08/12/2020 1525   MCV 88 05/31/2013 0846   MCH 30.3 08/12/2020 1525   MCHC 33.7 08/12/2020 1525   RDW 13.2 08/12/2020 1525   RDW 12.8 05/31/2013 0846   LYMPHSABS 2.5 08/12/2020 1525   LYMPHSABS 2.1 05/31/2013 0846   MONOABS 0.5 08/12/2020 1525   MONOABS 0.4 05/31/2013 0846   EOSABS 0.0 08/12/2020 1525   EOSABS 0.1 05/31/2013 0846   BASOSABS 0.1 08/12/2020 1525   BASOSABS 0.1 05/31/2013 0846    Hgb A1C Lab Results  Component Value Date   HGBA1C 6.1 09/02/2015           Assessment & Plan:    Webb Silversmith, NP This visit occurred during the SARS-CoV-2 public health emergency.  Safety protocols were in place, including screening questions prior to the  visit, additional usage of staff PPE, and extensive cleaning of exam room while observing appropriate contact time as indicated for disinfecting solutions.

## 2020-11-26 NOTE — Assessment & Plan Note (Signed)
Avoid foods that trigger reflux Encouraged weight loss as this can help his reflux symptoms Okay to continue Zantac OTC

## 2020-11-26 NOTE — Assessment & Plan Note (Signed)
Will increase Imitrex to 100 mg as needed Will monitor

## 2020-11-26 NOTE — Patient Instructions (Signed)

## 2020-11-26 NOTE — Assessment & Plan Note (Signed)
We will trial Adderall 10 mg XR daily  Update me in 1 month and let me know how you are doing

## 2020-11-26 NOTE — Assessment & Plan Note (Signed)
Controlled on Amlodipine °Reinforced DASH diet and exercise for weight loss °Will monitor °

## 2020-11-28 ENCOUNTER — Encounter: Payer: Self-pay | Admitting: Internal Medicine

## 2020-12-04 ENCOUNTER — Ambulatory Visit (INDEPENDENT_AMBULATORY_CARE_PROVIDER_SITE_OTHER): Payer: Federal, State, Local not specified - PPO | Admitting: Internal Medicine

## 2020-12-04 ENCOUNTER — Other Ambulatory Visit: Payer: Self-pay

## 2020-12-04 ENCOUNTER — Encounter: Payer: Self-pay | Admitting: Internal Medicine

## 2020-12-04 VITALS — BP 132/91 | HR 82 | Temp 98.2°F | Resp 18 | Ht 69.0 in | Wt 203.0 lb

## 2020-12-04 DIAGNOSIS — L989 Disorder of the skin and subcutaneous tissue, unspecified: Secondary | ICD-10-CM

## 2020-12-04 DIAGNOSIS — E782 Mixed hyperlipidemia: Secondary | ICD-10-CM | POA: Diagnosis not present

## 2020-12-04 DIAGNOSIS — Z0001 Encounter for general adult medical examination with abnormal findings: Secondary | ICD-10-CM

## 2020-12-04 DIAGNOSIS — Z1159 Encounter for screening for other viral diseases: Secondary | ICD-10-CM

## 2020-12-04 NOTE — Patient Instructions (Signed)
Health Maintenance, Female Adopting a healthy lifestyle and getting preventive care are important in promoting health and wellness. Ask your health care provider about:  The right schedule for you to have regular tests and exams.  Things you can do on your own to prevent diseases and keep yourself healthy. What should I know about diet, weight, and exercise? Eat a healthy diet  Eat a diet that includes plenty of vegetables, fruits, low-fat dairy products, and lean protein.  Do not eat a lot of foods that are high in solid fats, added sugars, or sodium.   Maintain a healthy weight Body mass index (BMI) is used to identify weight problems. It estimates body fat based on height and weight. Your health care provider can help determine your BMI and help you achieve or maintain a healthy weight. Get regular exercise Get regular exercise. This is one of the most important things you can do for your health. Most adults should:  Exercise for at least 150 minutes each week. The exercise should increase your heart rate and make you sweat (moderate-intensity exercise).  Do strengthening exercises at least twice a week. This is in addition to the moderate-intensity exercise.  Spend less time sitting. Even light physical activity can be beneficial. Watch cholesterol and blood lipids Have your blood tested for lipids and cholesterol at 43 years of age, then have this test every 5 years. Have your cholesterol levels checked more often if:  Your lipid or cholesterol levels are high.  You are older than 43 years of age.  You are at high risk for heart disease. What should I know about cancer screening? Depending on your health history and family history, you may need to have cancer screening at various ages. This may include screening for:  Breast cancer.  Cervical cancer.  Colorectal cancer.  Skin cancer.  Lung cancer. What should I know about heart disease, diabetes, and high blood  pressure? Blood pressure and heart disease  High blood pressure causes heart disease and increases the risk of stroke. This is more likely to develop in people who have high blood pressure readings, are of African descent, or are overweight.  Have your blood pressure checked: ? Every 3-5 years if you are 18-39 years of age. ? Every year if you are 40 years old or older. Diabetes Have regular diabetes screenings. This checks your fasting blood sugar level. Have the screening done:  Once every three years after age 40 if you are at a normal weight and have a low risk for diabetes.  More often and at a younger age if you are overweight or have a high risk for diabetes. What should I know about preventing infection? Hepatitis B If you have a higher risk for hepatitis B, you should be screened for this virus. Talk with your health care provider to find out if you are at risk for hepatitis B infection. Hepatitis C Testing is recommended for:  Everyone born from 1945 through 1965.  Anyone with known risk factors for hepatitis C. Sexually transmitted infections (STIs)  Get screened for STIs, including gonorrhea and chlamydia, if: ? You are sexually active and are younger than 43 years of age. ? You are older than 43 years of age and your health care provider tells you that you are at risk for this type of infection. ? Your sexual activity has changed since you were last screened, and you are at increased risk for chlamydia or gonorrhea. Ask your health care provider   if you are at risk.  Ask your health care provider about whether you are at high risk for HIV. Your health care provider may recommend a prescription medicine to help prevent HIV infection. If you choose to take medicine to prevent HIV, you should first get tested for HIV. You should then be tested every 3 months for as long as you are taking the medicine. Pregnancy  If you are about to stop having your period (premenopausal) and  you may become pregnant, seek counseling before you get pregnant.  Take 400 to 800 micrograms (mcg) of folic acid every day if you become pregnant.  Ask for birth control (contraception) if you want to prevent pregnancy. Osteoporosis and menopause Osteoporosis is a disease in which the bones lose minerals and strength with aging. This can result in bone fractures. If you are 65 years old or older, or if you are at risk for osteoporosis and fractures, ask your health care provider if you should:  Be screened for bone loss.  Take a calcium or vitamin D supplement to lower your risk of fractures.  Be given hormone replacement therapy (HRT) to treat symptoms of menopause. Follow these instructions at home: Lifestyle  Do not use any products that contain nicotine or tobacco, such as cigarettes, e-cigarettes, and chewing tobacco. If you need help quitting, ask your health care provider.  Do not use street drugs.  Do not share needles.  Ask your health care provider for help if you need support or information about quitting drugs. Alcohol use  Do not drink alcohol if: ? Your health care provider tells you not to drink. ? You are pregnant, may be pregnant, or are planning to become pregnant.  If you drink alcohol: ? Limit how much you use to 0-1 drink a day. ? Limit intake if you are breastfeeding.  Be aware of how much alcohol is in your drink. In the [place] one drink equals one 12 oz bottle of beer (355 mL), one 5 oz glass of wine (148 mL), or one 1 oz glass of hard liquor (44 mL). General instructions  Schedule regular health, dental, and eye exams.  Stay current with your vaccines.  Tell your health care provider if: ? You often feel depressed. ? You have ever been abused or do not feel safe at home. Summary  Adopting a healthy lifestyle and getting preventive care are important in promoting health and wellness.  Follow your health care provider's instructions about healthy  diet, exercising, and getting tested or screened for diseases.  Follow your health care provider's instructions on monitoring your cholesterol and blood pressure. This information is not intended to replace advice given to you by your health care provider. Make sure you discuss any questions you have with your health care provider. Document Revised: 06/21/2018 Document Reviewed: 06/21/2018 Elsevier Patient Education  2021 [employer]  

## 2020-12-04 NOTE — Progress Notes (Signed)
Subjective:    Patient ID: Linda [name], female    DOB: Nov 15, 1975, 45 y.o.   MRN: [id number]  HPI  Patient presents the clinic today for her annual exam.  Flu: 04/2020 Tetanus: 08/2015 COVID: [place] x2 Pap smear: hysterectomy Mammogram: 04/2019 Vision screening: annually Dentist: biannually  Diet: She does eat meat. She consume fruits and veggies. She does eat fried foods. She drinks mostly water. Exercise: None  Review of Systems      Past Medical History:  Diagnosis Date  . Alcohol abuse   . Anxiety   . Asthma   . GERD (gastroesophageal reflux disease)   . Heart murmur    while pregnant  . Hypertension   . Migraine     Current Outpatient Medications  Medication Sig Dispense Refill  . albuterol (VENTOLIN HFA) 108 (90 Base) MCG/ACT inhaler Inhale 2 puffs into the lungs every 6 (six) hours as needed for wheezing or shortness of breath. 18 g 0  . ALPRAZolam (XANAX) 0.5 MG tablet Take 1 tablet (0.5 mg total) by mouth daily as needed for anxiety. 20 tablet 0  . amLODipine (NORVASC) 5 MG tablet TAKE 1 TABLET BY MOUTH EVERY DAY 90 tablet 0  . amphetamine-dextroamphetamine (ADDERALL XR) 10 MG 24 hr capsule Take 1 capsule (10 mg total) by mouth daily. 30 capsule 0  . diphenhydrAMINE (BENADRYL) 25 MG tablet Take 25 mg by mouth every 6 (six) hours as needed.    . gabapentin (NEURONTIN) 300 MG capsule Take 1 capsule (300 mg total) by mouth 2 (two) times daily. 60 capsule 0  . SUMAtriptan (IMITREX) 100 MG tablet Take 1 tablet (100 mg total) by mouth every 2 (two) hours as needed for migraine. May repeat in 2 hours if headache persists or recurs. 10 tablet 5   No current facility-administered medications for this visit.    Allergies  Allergen Reactions  . Nsaids Other (See Comments)    Family History  Problem Relation Age of Onset  . Hypertension Father   . Diabetes Father   . Hypertension Mother   . Cancer Mother        uterine  . Stroke Mother   . Diabetes  Maternal Aunt   . Diabetes Maternal Uncle   . Cancer Maternal Grandmother        PANCREATIC  . Hypertension Maternal Grandmother   . Diabetes Maternal Grandmother   . Cancer Maternal Grandfather        LUNG  . Hypertension Maternal Grandfather   . Diabetes Maternal Grandfather   . Cancer Paternal Grandmother        THROAT  . Alzheimer's disease Paternal Grandmother     Social History   Socioeconomic History  . Marital status: Divorced    Spouse name: Not on file  . Number of children: Not on file  . Years of education: Not on file  . Highest education level: Not on file  Occupational History  . Not on file  Tobacco Use  . Smoking status: Never Smoker  . Smokeless tobacco: Never Used  Vaping Use  . Vaping Use: Never used  Substance and Sexual Activity  . Alcohol use: Yes    Comment: occ  . Drug use: No  . Sexual activity: Not on file  Other Topics Concern  . Not on file  Social History Narrative  . Not on file   Social Determinants of Health   Financial Resource Strain: Not on file  Food Insecurity: Not on file  Transportation Needs: Not on file  Physical Activity: Not on file  Stress: Not on file  Social Connections: Not on file  Intimate Partner Violence: Not on file     Constitutional: Pt reports intermittent headaches. Denies fever, malaise, fatigue, or abrupt weight changes.  HEENT: Denies eye pain, eye redness, ear pain, ringing in the ears, wax buildup, runny nose, nasal congestion, bloody nose, or sore throat. Respiratory: Denies difficulty breathing, shortness of breath, cough or sputum production.   Cardiovascular: Denies chest pain, chest tightness, palpitations or swelling in the hands or feet.  Gastrointestinal: Denies abdominal pain, bloating, constipation, diarrhea or blood in the stool.  GU: Denies urgency, frequency, pain with urination, burning sensation, blood in urine, odor or discharge. Musculoskeletal: Denies decrease in range of motion,  difficulty with gait, muscle pain or joint pain and swelling.  Skin: Pt reports skin lesion of left arm. Denies redness, rashes, lesions or ulcercations.  Neurological: Pt reports inattention. Denies dizziness, difficulty with memory, difficulty with speech or problems with balance and coordination.  Psych: Pt has a history of anxiety and depression. Denies SI/HI.  No other specific complaints in a complete review of systems (except as listed in HPI above).  Objective:   Physical Exam  BP (!) 132/91 (BP Location: Left Arm, Patient Position: Sitting, Cuff Size: Normal)   Pulse 82   Temp 98.2 F (36.8 C) (Temporal)   Resp 18   Ht _0  (1.753 m)   Wt 203 lb (92.1 kg)   LMP 07/12/2011   SpO2 100%   BMI 29.98 kg/m   Wt Readings from Last 3 Encounters:  11/26/20 200 lb 9.6 oz (91 kg)  08/12/20 198 lb (89.8 kg)  07/25/20 190 lb (86.2 kg)    General: Appears her stated age, well developed, well nourished in NAD. Skin: Warm, dry and intact. 1 cm hyperpigmented lesion to left upper arm. HEENT: Head: normal shape and size; Eyes: sclera white and EOMs intact;  Neck:  Neck supple, trachea midline. No masses, lumps or thyromegaly present.  Cardiovascular: Normal rate and rhythm. S1,S2 noted.  No murmur, rubs or gallops noted. No JVD or BLE edema.  Pulmonary/Chest: Normal effort and positive vesicular breath sounds. No respiratory distress. No wheezes, rales or ronchi noted.  Abdomen: Soft and nontender. Normal bowel sounds. No distention or masses noted. Liver, spleen and kidneys non palpable. Musculoskeletal: Strength 5/5 BUe/BLE. No difficulty with gait.  Neurological: Alert and oriented. Cranial nerves II-XII grossly intact. Coordination normal.  Psychiatric: Mood and affect normal. Behavior is normal. Judgment and thought content normal.    BMET    Component Value Date/Time   NA 135 08/12/2020 1525   NA 137 05/31/2013 0846   K 3.7 08/12/2020 1525   K 3.8 05/31/2013 0846   CL 101  08/12/2020 1525   CL 106 05/31/2013 0846   CO2 24 08/12/2020 1525   CO2 26 05/31/2013 0846   GLUCOSE 93 08/12/2020 1525   GLUCOSE 93 05/31/2013 0846   BUN 13 08/12/2020 1525   BUN 10 05/31/2013 0846   CREATININE 0.83 08/12/2020 1525   CREATININE 0.76 05/31/2013 0846   CALCIUM 8.7 (L) 08/12/2020 1525   CALCIUM 8.2 (L) 05/31/2013 0846   GFRNONAA >60 08/12/2020 1525   GFRNONAA >60 05/31/2013 0846   GFRAA >60 02/08/2020 0600   GFRAA >60 05/31/2013 0846    Lipid Panel     Component Value Date/Time   CHOL 232 (H) 05/17/2019 1606   TRIG 176.0 (H) 05/17/2019 1606  HDL 102.60 05/17/2019 1606   CHOLHDL 2 05/17/2019 1606   VLDL 35.2 05/17/2019 1606   LDLCALC 94 05/17/2019 1606    CBC    Component Value Date/Time   WBC 8.4 08/12/2020 1525   RBC 4.39 08/12/2020 1525   HGB 13.3 08/12/2020 1525   HGB 13.3 05/31/2013 0846   HCT 39.5 08/12/2020 1525   HCT 39.2 05/31/2013 0846   PLT 301 08/12/2020 1525   PLT 219 05/31/2013 0846   MCV 90.0 08/12/2020 1525   MCV 88 05/31/2013 0846   MCH 30.3 08/12/2020 1525   MCHC 33.7 08/12/2020 1525   RDW 13.2 08/12/2020 1525   RDW 12.8 05/31/2013 0846   LYMPHSABS 2.5 08/12/2020 1525   LYMPHSABS 2.1 05/31/2013 0846   MONOABS 0.5 08/12/2020 1525   MONOABS 0.4 05/31/2013 0846   EOSABS 0.0 08/12/2020 1525   EOSABS 0.1 05/31/2013 0846   BASOSABS 0.1 08/12/2020 1525   BASOSABS 0.1 05/31/2013 0846    Hgb A1C Lab Results  Component Value Date   HGBA1C 6.1 09/02/2015           Assessment & Plan:   Preventative Health Maintenance:  Encouraged her to get a flu shot fall Tetanus UTD Encouraged her to get her COVID booster She no longer needs pap smears Mammogram ordered, she will call to schedule Encouraged her to consume a balanced diet and exercise regimen Advised her to see an eye doctor and dentist annually We will check CBC, c-Met, TSH, lipid, A1c and hep C today  Skin Lesion of Arm:  Referral to dermatology placed  RTC in  1 year, sooner if needed Webb Silversmith, NP  This visit occurred during the SARS-CoV-2 public health emergency.  Safety protocols were in place, including screening questions prior to the visit, additional usage of staff PPE, and extensive cleaning of exam room while observing appropriate contact time as indicated for disinfecting solutions.

## 2020-12-05 ENCOUNTER — Encounter (HOSPITAL_COMMUNITY): Payer: Self-pay | Admitting: *Deleted

## 2020-12-05 LAB — CBC
HCT: 39.3 % (ref 35.0–45.0)
Hemoglobin: 12.5 g/dL (ref 11.7–15.5)
MCH: 29.1 pg (ref 27.0–33.0)
MCHC: 31.8 g/dL — ABNORMAL LOW (ref 32.0–36.0)
MCV: 91.4 fL (ref 80.0–100.0)
MPV: 10.3 fL (ref 7.5–12.5)
Platelets: 281 10*3/uL (ref 140–400)
RBC: 4.3 10*6/uL (ref 3.80–5.10)
RDW: 12.5 % (ref 11.0–15.0)
WBC: 6 10*3/uL (ref 3.8–10.8)

## 2020-12-05 LAB — HEPATITIS C ANTIBODY
Hepatitis C Ab: NONREACTIVE
SIGNAL TO CUT-OFF: 0.01 (ref <1.00)

## 2020-12-05 LAB — HEMOGLOBIN A1C
Hgb A1c MFr Bld: 5.5 % of total Hgb (ref <5.7)
Mean Plasma Glucose: 111 mg/dL
eAG (mmol/L): 6.2 mmol/L

## 2020-12-05 LAB — LIPID PANEL
Cholesterol: 235 mg/dL — ABNORMAL HIGH (ref <200)
HDL: 84 mg/dL (ref >=50)
LDL Cholesterol (Calc): 114 mg/dL (calc) — ABNORMAL HIGH
Non-HDL Cholesterol (Calc): 151 mg/dL (calc) — ABNORMAL HIGH (ref <130)
Total CHOL/HDL Ratio: 2.8 (calc) (ref <5.0)
Triglycerides: 241 mg/dL — ABNORMAL HIGH (ref <150)

## 2020-12-05 LAB — COMPREHENSIVE METABOLIC PANEL
AG Ratio: 1.5 (calc) (ref 1.0–2.5)
ALT: 15 U/L (ref 6–29)
AST: 16 U/L (ref 10–30)
Albumin: 4 g/dL (ref 3.6–5.1)
Alkaline phosphatase (APISO): 70 U/L (ref 31–125)
BUN: 11 mg/dL (ref 7–25)
CO2: 24 mmol/L (ref 20–32)
Calcium: 8.5 mg/dL — ABNORMAL LOW (ref 8.6–10.2)
Chloride: 104 mmol/L (ref 98–110)
Creat: 0.77 mg/dL (ref 0.50–1.10)
Globulin: 2.6 g/dL (calc) (ref 1.9–3.7)
Glucose, Bld: 89 mg/dL (ref 65–99)
Potassium: 4.4 mmol/L (ref 3.5–5.3)
Sodium: 137 mmol/L (ref 135–146)
Total Bilirubin: 0.4 mg/dL (ref 0.2–1.2)
Total Protein: 6.6 g/dL (ref 6.1–8.1)

## 2020-12-05 LAB — TSH: TSH: 1.01 mIU/L

## 2020-12-05 NOTE — Addendum Note (Signed)
Addended by: Jearld Fenton on: 12/05/2020 09:18 AM   Modules accepted: Orders

## 2020-12-09 ENCOUNTER — Encounter: Payer: Self-pay | Admitting: Internal Medicine

## 2020-12-11 ENCOUNTER — Telehealth: Payer: Federal, State, Local not specified - PPO | Admitting: Physician Assistant

## 2020-12-11 DIAGNOSIS — K219 Gastro-esophageal reflux disease without esophagitis: Secondary | ICD-10-CM | POA: Diagnosis not present

## 2020-12-11 NOTE — Progress Notes (Signed)
E-Visit for Heartburn  We are sorry that you are not feeling well.  Here is how we plan to help!  Based on what you shared with me it looks like you most likely have Gastroesophageal Reflux Disease (GERD)  Gastroesophageal reflux disease (GERD) happens when acid from your stomach flows up into the esophagus.  When acid comes in contact with the esophagus, the acid causes sorenss (inflammation) in the esophagus.  Over time, GERD may create small holes (ulcers) in the lining of the esophagus.  I would recommend to take Prilosec (Omeprazole) 20mg  twice daily (available OTC) and Pepcid (famotidine) 20mg  (available OTC) once daily at bedtime with omeprazole.  I do feel you should be seen by your PCP soon for these issues. If you are having severe GERD that is not responding to treatments, it is possible you may have something more severe like peptic ulcer disease. We do have treatments for this, but you have to be monitored closely for improvement in symptoms.   Your symptoms should improve in the next day or two.  You can use antacids as needed until symptoms resolve.  Call us if your heartburn worsens, you have trouble swallowing, weight loss, spitting up blood or recurrent vomiting.  Home Care:  May include lifestyle changes such as weight loss, quitting smoking and alcohol consumption  Avoid foods and drinks that make your symptoms worse, such as:  Caffeine or alcoholic drinks  Chocolate  Peppermint or mint flavorings  Garlic and onions  Spicy foods  Citrus fruits, such as oranges, lemons, or limes  Tomato-based foods such as sauce, chili, salsa and pizza  Fried and fatty foods  Avoid lying down for 3 hours prior to your bedtime or prior to taking a nap  Eat small, frequent meals instead of a large meals  Wear loose-fitting clothing.  Do not wear anything tight around your waist that causes pressure on your stomach.  Raise the head of your bed 6 to 8 inches with wood blocks  to help you sleep.  Extra pillows will not help.  Seek Help Right Away If:  You have pain in your arms, neck, jaw, teeth or back  Your pain increases or changes in intensity or duration  You develop nausea, vomiting or sweating (diaphoresis)  You develop shortness of breath or you faint  Your vomit is green, yellow, black or looks like coffee grounds or blood  Your stool is red, bloody or black  These symptoms could be signs of other problems, such as heart disease, gastric bleeding or esophageal bleeding.  Make sure you :  Understand these instructions.  Will watch your condition.  Will get help right away if you are not doing well or get worse.  Your e-visit answers were reviewed by a board certified advanced clinical practitioner to complete your personal care plan.  Depending on the condition, your plan could have included both over the counter or prescription medications.  If there is a problem please reply  once you have received a response from your provider.  Your safety is important to [place].  If you have drug allergies check your prescription carefully.    You can use MyChart to ask questions about today's visit, request a non-urgent call back, or ask for a work or school excuse for 24 hours related to this e-Visit. If it has been greater than 24 hours you will need to follow up with your provider, or enter a new e-Visit to address those concerns.  You  will get an e-mail in the next two days asking about your experience.  I hope that your e-visit has been valuable and will speed your recovery. Thank you for using e-visits.   I provided 7 minutes of non face-to-face time during this encounter for chart review and documentation.

## 2020-12-18 ENCOUNTER — Encounter: Payer: Self-pay | Admitting: Internal Medicine

## 2020-12-18 MED ORDER — AMPHETAMINE-DEXTROAMPHET ER 20 MG PO CP24
20.0000 mg | ORAL_CAPSULE | ORAL | 0 refills | Status: DC
Start: 2020-12-18 — End: 2021-02-09

## 2020-12-18 MED ORDER — AMPHETAMINE-DEXTROAMPHET ER 20 MG PO CP24: 20.0000 mg | ORAL_CAPSULE | ORAL | 0 refills | Status: DC

## 2020-12-18 NOTE — Addendum Note (Signed)
Addended by: Jearld Fenton on: 12/18/2020 01:57 PM   Modules accepted: Orders

## 2020-12-21 DIAGNOSIS — M549 Dorsalgia, unspecified: Secondary | ICD-10-CM | POA: Diagnosis not present

## 2020-12-21 DIAGNOSIS — R0789 Other chest pain: Secondary | ICD-10-CM | POA: Diagnosis not present

## 2020-12-21 DIAGNOSIS — R079 Chest pain, unspecified: Secondary | ICD-10-CM | POA: Diagnosis not present

## 2020-12-28 ENCOUNTER — Other Ambulatory Visit: Payer: Self-pay | Admitting: Internal Medicine

## 2020-12-29 MED ORDER — ALPRAZOLAM 0.5 MG PO TABS: 0.5000 mg | ORAL_TABLET | Freq: Every day | ORAL | 0 refills | Status: DC | PRN

## 2021-01-01 ENCOUNTER — Encounter: Payer: Self-pay | Admitting: Internal Medicine

## 2021-01-21 ENCOUNTER — Other Ambulatory Visit: Payer: Self-pay | Admitting: Internal Medicine

## 2021-01-22 ENCOUNTER — Other Ambulatory Visit: Payer: Self-pay | Admitting: Internal Medicine

## 2021-01-22 MED ORDER — ALPRAZOLAM 0.5 MG PO TABS: 0.5000 mg | ORAL_TABLET | Freq: Every day | ORAL | 0 refills | Status: DC | PRN

## 2021-01-27 ENCOUNTER — Encounter: Payer: Self-pay | Admitting: Internal Medicine

## 2021-01-27 NOTE — Telephone Encounter (Signed)
Could you please call and see what the problem is with her Xanax prescription

## 2021-01-28 MED ORDER — ALPRAZOLAM 0.5 MG PO TABS: 0.5000 mg | ORAL_TABLET | Freq: Every day | ORAL | 0 refills | Status: DC | PRN

## 2021-02-02 ENCOUNTER — Encounter: Payer: Self-pay | Admitting: Internal Medicine

## 2021-02-03 ENCOUNTER — Ambulatory Visit
Admission: RE | Admit: 2021-02-03 | Discharge: 2021-02-03 | Disposition: A | Discharge status: Home / Self Care | Payer: Federal, State, Local not specified - PPO | Source: Ambulatory Visit | Attending: Family Medicine | Admitting: Family Medicine

## 2021-02-03 ENCOUNTER — Other Ambulatory Visit: Payer: Self-pay

## 2021-02-03 ENCOUNTER — Ambulatory Visit: Payer: Self-pay

## 2021-02-03 ENCOUNTER — Ambulatory Visit (INDEPENDENT_AMBULATORY_CARE_PROVIDER_SITE_OTHER): Payer: Federal, State, Local not specified - PPO

## 2021-02-03 VITALS — BP 138/96 | HR 80 | Temp 98.0°F | Resp 18

## 2021-02-03 DIAGNOSIS — M25562 Pain in left knee: Secondary | ICD-10-CM

## 2021-02-03 MED ORDER — PREDNISONE 20 MG PO TABS: 40.0000 mg | ORAL_TABLET | Freq: Every day | ORAL | 0 refills | Status: DC

## 2021-02-03 NOTE — Telephone Encounter (Signed)
Reason for Disposition  A "snap" or "pop" was heard at the time of injury  Answer Assessment - Initial Assessment Questions 1. MECHANISM: "How did the injury happen?" (e.g., twisting injury, direct blow)      Twisted 2. ONSET: "When did the injury happen?" (Minutes or hours ago)      Last week 3. LOCATION: "Where is the injury located?"      Left knee 4. APPEARANCE of INJURY: "What does the injury look like?"      Swollen 5. SEVERITY: "Can you put weight on that leg?" "Can you walk?"      Hurts 6. SIZE: For cuts, bruises, or swelling, ask: "How large is it?" (e.g., inches or centimeters;  entire joint)      No cuts 7. PAIN: "Is there pain?" If Yes, ask: "How bad is the pain?"  "What does it keep you from doing?" (e.g., Scale 1-10; or mild, moderate, severe)   -  NONE: (0): no pain   -  MILD (1-3): doesn't interfere with normal activities    -  MODERATE (4-7): interferes with normal activities (e.g., work or school) or awakens from sleep, limping    -  SEVERE (8-10): excruciating pain, unable to do any normal activities, unable to walk     6 8. TETANUS: For any breaks in the skin, ask: "When was the last tetanus booster?"     Unsure 9. OTHER SYMPTOMS: "Do you have any other symptoms?"  (e.g., "pop" when knee injured, swelling, locking, buckling)      Swollen 10. PREGNANCY: "Is there any chance you are pregnant?" "When was your last menstrual period?"       No  Protocols used: Knee Injury-A-AH

## 2021-02-03 NOTE — ED Provider Notes (Signed)
[name]    CSN: [id number] Arrival date & time: 02/03/21  1426      History   Chief Complaint Chief Complaint  Patient presents with   Knee Pain   APPT 1500    HPI Linda [name] is a 45 y.o. female.    HPI Patient presents for evaluation of left knee pain. Patient reports while traveling in [place] and ultimately twisted her left knee and now has pain radiating up into her left hip. She has taken tylenol without relief of pain.  Past Medical History:  Diagnosis Date   Alcohol abuse    Anxiety    Asthma    GERD (gastroesophageal reflux disease)    Heart murmur    while pregnant   Hypertension    Migraine     Patient Active Problem List   Diagnosis Date Noted   ADHD 11/26/2020   HTN (hypertension) [id number]   Complicated migraine [id number]   GERD (gastroesophageal reflux disease) 01/03/2017   Anxiety and depression 04/15/2014    Past Surgical History:  Procedure Laterality Date   ABDOMINAL HYSTERECTOMY  12/20012   Total   ENDOMETRIAL ABLATION  2012   GASTRIC ROUX-EN-Y N/A 05/18/2016   Procedure: LAPAROSCOPIC ROUX-EN-Y GASTRIC WITH UPPER ENDO;  Surgeon: Arta Bruce Kinsinger, MD;  Location: [facility];  Service: General;  Laterality: N/A;   MYRINGOTOMY WITH TUBE PLACEMENT     X 3 SINCE ORRIGINALLY PLACE IN 2010   TONSILECTOMY, ADENOIDECTOMY, BILATERAL MYRINGOTOMY AND TUBES  2010   TONSILLECTOMY     TUBAL LIGATION  08/2010   UPPER GI ENDOSCOPY  05/18/2016   Procedure: UPPER GI ENDOSCOPY;  Surgeon: Arta Bruce Kinsinger, MD;  Location: [facility];  Service: General;;    OB History   No obstetric history on file.      Home Medications    Prior to Admission medications   Medication Sig Start Date End Date Taking? Authorizing Provider  predniSONE (DELTASONE) 20 MG tablet Take 2 tablets (40 mg total) by mouth daily with breakfast. 02/03/21  Yes Scot Jun, FNP  albuterol (VENTOLIN HFA) 108 (90 Base) MCG/ACT  inhaler Inhale 2 puffs into the lungs every 6 (six) hours as needed for wheezing or shortness of breath. 09/13/19   Jearld Fenton, NP  ALPRAZolam Duanne Moron) 0.5 MG tablet Take 1 tablet (0.5 mg total) by mouth daily as needed for anxiety. 01/28/21   Jearld Fenton, NP  amLODipine (NORVASC) 5 MG tablet TAKE 1 TABLET BY MOUTH EVERY DAY 01/22/21   Jearld Fenton, NP  amphetamine-dextroamphetamine (ADDERALL XR) 20 MG 24 hr capsule Take 1 capsule (20 mg total) by mouth every morning. 12/18/20   Jearld Fenton, NP  diphenhydrAMINE (BENADRYL) 25 MG tablet Take 25 mg by mouth every 6 (six) hours as needed.    [provider]  gabapentin (NEURONTIN) 300 MG capsule Take 1 capsule (300 mg total) by mouth 2 (two) times daily. 08/09/20 09/08/20  Hassell Done Mary-Margaret, FNP  SUMAtriptan (IMITREX) 100 MG tablet Take 1 tablet (100 mg total) by mouth every 2 (two) hours as needed for migraine. May repeat in 2 hours if headache persists or recurs. 11/26/20   Jearld Fenton, NP    Family History Family History  Problem Relation Age of Onset   Hypertension Father    Diabetes Father    Hypertension Mother    Cancer Mother        uterine   Stroke Mother  Diabetes Maternal Aunt    Diabetes Maternal Uncle    Cancer Maternal Grandmother        PANCREATIC   Hypertension Maternal Grandmother    Diabetes Maternal Grandmother    Cancer Maternal Grandfather        LUNG   Hypertension Maternal Grandfather    Diabetes Maternal Grandfather    Cancer Paternal Grandmother        THROAT   Alzheimer's disease Paternal Grandmother     Social History Social History   Tobacco Use   Smoking status: Never   Smokeless tobacco: Never  Vaping Use   Vaping Use: Never used  Substance Use Topics   Alcohol use: Yes    Comment: occ   Drug use: No     Allergies   Nsaids   Review of Systems Review of Systems Pertinent negatives listed in HPI   Physical Exam Triage Vital Signs ED Triage Vitals  Enc Vitals  Group     BP 02/03/21 1432 (!) 138/96     Pulse Rate 02/03/21 1432 80     Resp 02/03/21 1432 18     Temp 02/03/21 1432 98 F (36.7 C)     Temp Source 02/03/21 1432 Oral     SpO2 02/03/21 1432 98 %     Weight --      Height --      Head Circumference --      Peak Flow --      Pain Score 02/03/21 1442 6     Pain Loc --      Pain Edu? --      Excl. in [place]? --    No data found.  Updated Vital Signs BP (!) 138/96 (BP Location: Left Arm)   Pulse 80   Temp 98 F (36.7 C) (Oral)   Resp 18   LMP 07/12/2011   SpO2 98%   Visual Acuity Right Eye Distance:   Left Eye Distance:   Bilateral Distance:    Right Eye Near:   Left Eye Near:    Bilateral Near:     Physical Exam Constitutional:      Appearance: She is not ill-appearing.  Cardiovascular:     Rate and Rhythm: Normal rate and regular rhythm.  Pulmonary:     Effort: Pulmonary effort is normal.     Breath sounds: Normal breath sounds.  Musculoskeletal:     Right knee: Normal.     Left knee: Decreased range of motion. Tenderness present over the medial joint line. No MCL laxity. Skin:    Capillary Refill: Capillary refill takes less than 2 seconds.  Neurological:     Mental Status: She is alert.      UC Treatments / Results  Labs (all labs ordered are listed, but only abnormal results are displayed) Labs Reviewed - No data to display  EKG   Radiology DG Knee Complete 4 Views Left  Result Date: 02/03/2021 CLINICAL DATA:  Left knee pain, twisting EXAM: LEFT KNEE - COMPLETE 4+ VIEW COMPARISON:  None. FINDINGS: No evidence of fracture, dislocation, or joint effusion. No evidence of arthropathy or other focal bone abnormality. Soft tissues are unremarkable. IMPRESSION: No fracture or dislocation of the left knee. Joint spaces are preserved. Electronically Signed   By: Eddie Candle M.D.   On: 02/03/2021 15:22    Procedures Procedures (including critical care time)  Medications Ordered in UC Medications - No data  to display  Initial Impression / Assessment and Plan / UC  Course  I have reviewed the triage vital signs and the nursing notes.  Pertinent labs & imaging results that were available during my care of the patient were reviewed by me and considered in my medical decision making (see chart for details).    Left knee pain, related to twisting injury. Prednisone 40 mg daily x 5 days RICE and ACE Tylenol for acute pain Follow-up with orthopedic if symptoms do not improve.  Final Clinical Impressions(s) / UC Diagnoses   Final diagnoses:  Acute pain of left knee     Discharge Instructions      Imaging is negative for fracture or misalignment or fluid. Recommend follow-up at [facility] if symptoms persists or worsen. Continue ACE wrap with walking and prolonged standing. Recommend elevating left leg with sitting and apply ice if swelling occurs. Complete entire course of prednisone.     ED Prescriptions     Medication Sig Dispense Auth. Provider   predniSONE (DELTASONE) 20 MG tablet Take 2 tablets (40 mg total) by mouth daily with breakfast. 10 tablet Scot Jun, FNP      PDMP not reviewed this encounter.   Scot Jun, FNP 02/03/21 1536

## 2021-02-03 NOTE — ED Triage Notes (Addendum)
Patient c/o LFT knee pain x 3 days.   Patient endorses " [place] and I fell, and attempted to get my leg out of the door".   Patient endorses pain is starting to radiate down my leg.   Patient endorses increased pain " when turning my leg I feel like it's going to give out".   Patient endorses LFT sided hip pain.   Patient endorses a history of "tearing some ligament in my knee".   Patient has taken Tylenol with no relief of symptoms.

## 2021-02-03 NOTE — Discharge Instructions (Addendum)
Imaging is negative for fracture or misalignment or fluid. Recommend follow-up at [facility] if symptoms persists or worsen. Continue ACE wrap with walking and prolonged standing. Recommend elevating left leg with sitting and apply ice if swelling occurs. Complete entire course of prednisone.

## 2021-02-03 NOTE — Telephone Encounter (Signed)
Pt. Reports she injured her left knee last week while out of town. Knee is swollen, certain movements "hurt really bad." No available appointments until Thursday. Offered to make an appointment. Declines. States she will go to [facility].

## 2021-02-04 NOTE — Telephone Encounter (Signed)
noted 

## 2021-02-09 ENCOUNTER — Other Ambulatory Visit: Payer: Self-pay | Admitting: Internal Medicine

## 2021-02-09 MED ORDER — AMPHETAMINE-DEXTROAMPHET ER 20 MG PO CP24: 20.0000 mg | ORAL_CAPSULE | ORAL | 0 refills | Status: DC

## 2021-02-18 ENCOUNTER — Telehealth: Payer: Federal, State, Local not specified - PPO | Admitting: Family Medicine

## 2021-02-18 ENCOUNTER — Other Ambulatory Visit: Payer: Self-pay | Admitting: Family Medicine

## 2021-02-18 ENCOUNTER — Encounter: Payer: Self-pay | Admitting: Internal Medicine

## 2021-02-18 DIAGNOSIS — B029 Zoster without complications: Secondary | ICD-10-CM

## 2021-02-18 MED ORDER — VALACYCLOVIR HCL 1 G PO TABS: 1000.0000 mg | ORAL_TABLET | Freq: Three times a day (TID) | ORAL | 0 refills | Status: AC

## 2021-02-18 MED ORDER — GABAPENTIN 300 MG PO CAPS
300.0000 mg | ORAL_CAPSULE | Freq: Two times a day (BID) | ORAL | 0 refills | Status: DC
Start: 2021-02-18 — End: 2021-03-09

## 2021-02-18 NOTE — Progress Notes (Signed)
E-visit for Shingles   We are sorry that you are not feeling well. Here is how we plan to help!  Based on what you shared with me it looks like you have shingles.  Shingles or herpes zoster, is a common infection of the nerves.  It is a painful rash caused by the herpes zoster virus.  This is the same virus that causes chickenpox.  After a person has chickenpox, the virus remains inactive in the nerve cells.  Years later, the virus can become active again and travel to the skin.  It typically will appear on one side of the face or body.  Burning or shooting pain, tingling, or itching are early signs of the infection.  Blisters typically scab over in 7 to 10 days and clear up within 2-4 weeks. Shingles is only contagious to people that have never had the chickenpox, the chickenpox vaccine, or anyone who has a compromised immune system.  You should avoid contact with these type of people until your blisters scab over.  I have prescribed Valacyclovir 1g three times daily for 7 days continue your gabapentin you are on.  HOME CARE: Apply ice packs (wrapped in a thin towel), cool compresses, or soak in cool bath to help reduce pain. Use calamine lotion to calm itchy skin. Avoid scratching the rash. Avoid direct sunlight.  GET HELP RIGHT AWAY IF: Symptoms that don't away after treatment. A rash or blisters near your eye. Increased drainage, fever, or rash after treatment. Severe pain that doesn't go away.   MAKE SURE YOU   Understand these instructions. Will watch your condition. Will get help right away if you are not doing well or get worse.  Thank you for choosing an e-visit.  Your e-visit answers were reviewed by a board certified advanced clinical practitioner to complete your personal care plan. Depending upon the condition, your plan could have included both over the counter or prescription medications.  Please review your pharmacy choice. Make sure the pharmacy is open so you can  pick up prescription now. If there is a problem, you may contact your provider through [employer] and have the prescription routed to another pharmacy.  Your safety is important to [place]. If you have drug allergies check your prescription carefully.   For the next 24 hours you can use MyChart to ask questions about today's visit, request a non-urgent call back, or ask for a work or school excuse. You will get an email in the next two days asking about your experience. I hope that your e-visit has been valuable and will speed your recovery.   I provided 7 minutes of non face-to-face time during this encounter for chart review, medication and order placement, as well as and documentation.

## 2021-02-26 ENCOUNTER — Other Ambulatory Visit: Payer: Self-pay | Admitting: Internal Medicine

## 2021-02-26 ENCOUNTER — Encounter: Payer: Self-pay | Admitting: Internal Medicine

## 2021-02-26 NOTE — Telephone Encounter (Signed)
Requested medication (s) are due for refill today: yes   Requested medication (s) are on the active medication list: yes  Last refill:  01/28/21 #20 0 refills  Future visit scheduled: no  Notes to clinic:  not delegated per protocol     Requested Prescriptions  Pending Prescriptions Disp Refills   ALPRAZolam (XANAX) 0.5 MG tablet [Pharmacy Med Name: ALPRAZOLAM 0.5 MG TABLET] 20 tablet 0    Sig: TAKE 1 TABLET BY MOUTH EVERY DAY AS NEEDED     Not Delegated - Psychiatry:  Anxiolytics/Hypnotics Failed - 02/26/2021  4:51 PM      Failed - This refill cannot be delegated      Failed - Urine Drug Screen completed in last 360 days      Passed - Valid encounter within last 6 months    Recent Outpatient Visits           2 months ago Encounter for general adult medical examination with abnormal findings   [facility] [place], Coralie Keens, NP   3 months ago Primary hypertension   [place], Coralie Keens, NP   5 years ago Sciatica of left side   [facility], Synetta Shadow, MD   5 years ago 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       Future Appointments             In 3 months [place], Ronalee Red, [place] [facility], [facility]

## 2021-02-27 MED ORDER — ALPRAZOLAM 0.5 MG PO TABS: 0.5000 mg | ORAL_TABLET | Freq: Every day | ORAL | 0 refills | Status: DC | PRN

## 2021-03-02 ENCOUNTER — Ambulatory Visit: Payer: Federal, State, Local not specified - PPO | Admitting: Internal Medicine

## 2021-03-09 ENCOUNTER — Telehealth: Payer: Federal, State, Local not specified - PPO | Admitting: Family Medicine

## 2021-03-09 DIAGNOSIS — B029 Zoster without complications: Secondary | ICD-10-CM

## 2021-03-09 MED ORDER — GABAPENTIN 300 MG PO CAPS: 300.0000 mg | ORAL_CAPSULE | Freq: Two times a day (BID) | ORAL | 0 refills | Status: DC

## 2021-03-09 NOTE — Progress Notes (Signed)
  [place]  Pt needed med refill from last visit- due to loss of medication No new Dx provided

## 2021-03-11 ENCOUNTER — Ambulatory Visit: Payer: Federal, State, Local not specified - PPO | Admitting: Internal Medicine

## 2021-03-27 ENCOUNTER — Other Ambulatory Visit: Payer: Self-pay | Admitting: Internal Medicine

## 2021-03-27 MED ORDER — ALPRAZOLAM 0.5 MG PO TABS: 0.5000 mg | ORAL_TABLET | Freq: Every day | ORAL | 0 refills | Status: DC | PRN

## 2021-03-31 ENCOUNTER — Other Ambulatory Visit: Payer: Self-pay | Admitting: Internal Medicine

## 2021-03-31 MED ORDER — AMPHETAMINE-DEXTROAMPHET ER 20 MG PO CP24: 20.0000 mg | ORAL_CAPSULE | ORAL | 0 refills | Status: DC

## 2021-04-13 ENCOUNTER — Encounter: Payer: Self-pay | Admitting: Internal Medicine

## 2021-04-19 DIAGNOSIS — I499 Cardiac arrhythmia, unspecified: Secondary | ICD-10-CM | POA: Diagnosis not present

## 2021-04-19 DIAGNOSIS — R0789 Other chest pain: Secondary | ICD-10-CM | POA: Diagnosis not present

## 2021-04-19 DIAGNOSIS — R079 Chest pain, unspecified: Secondary | ICD-10-CM | POA: Diagnosis not present

## 2021-04-19 DIAGNOSIS — R000 Tachycardia, unspecified: Secondary | ICD-10-CM | POA: Diagnosis not present

## 2021-04-21 ENCOUNTER — Other Ambulatory Visit: Payer: Self-pay

## 2021-04-21 ENCOUNTER — Ambulatory Visit: Payer: Federal, State, Local not specified - PPO | Admitting: Internal Medicine

## 2021-04-21 ENCOUNTER — Encounter: Payer: Self-pay | Admitting: Internal Medicine

## 2021-04-21 VITALS — BP 121/79 | HR 63 | Temp 97.7°F | Resp 18 | Ht 69.0 in | Wt 194.8 lb

## 2021-04-21 DIAGNOSIS — Z6827 Body mass index (BMI) 27.0-27.9, adult: Secondary | ICD-10-CM | POA: Insufficient documentation

## 2021-04-21 DIAGNOSIS — R079 Chest pain, unspecified: Secondary | ICD-10-CM | POA: Diagnosis not present

## 2021-04-21 DIAGNOSIS — R739 Hyperglycemia, unspecified: Secondary | ICD-10-CM

## 2021-04-21 DIAGNOSIS — R42 Dizziness and giddiness: Secondary | ICD-10-CM

## 2021-04-21 DIAGNOSIS — R519 Headache, unspecified: Secondary | ICD-10-CM

## 2021-04-21 DIAGNOSIS — K219 Gastro-esophageal reflux disease without esophagitis: Secondary | ICD-10-CM

## 2021-04-21 DIAGNOSIS — Z23 Encounter for immunization: Secondary | ICD-10-CM | POA: Diagnosis not present

## 2021-04-21 DIAGNOSIS — R197 Diarrhea, unspecified: Secondary | ICD-10-CM

## 2021-04-21 DIAGNOSIS — R609 Edema, unspecified: Secondary | ICD-10-CM

## 2021-04-21 DIAGNOSIS — Z903 Acquired absence of stomach [part of]: Secondary | ICD-10-CM

## 2021-04-21 DIAGNOSIS — F32A Depression, unspecified: Secondary | ICD-10-CM

## 2021-04-21 DIAGNOSIS — Z6828 Body mass index (BMI) 28.0-28.9, adult: Secondary | ICD-10-CM

## 2021-04-21 DIAGNOSIS — F419 Anxiety disorder, unspecified: Secondary | ICD-10-CM

## 2021-04-21 DIAGNOSIS — I10 Essential (primary) hypertension: Secondary | ICD-10-CM

## 2021-04-21 DIAGNOSIS — E663 Overweight: Secondary | ICD-10-CM

## 2021-04-21 MED ORDER — PANTOPRAZOLE SODIUM 40 MG PO TBEC: 40.0000 mg | DELAYED_RELEASE_TABLET | Freq: Every day | ORAL | 0 refills | Status: DC

## 2021-04-21 NOTE — Assessment & Plan Note (Signed)
She is actively working on weight loss

## 2021-04-21 NOTE — Patient Instructions (Signed)
Nonspecific Chest Pain Chest pain can be caused by many different conditions. Some causes of chest pain can be life-threatening. These will require treatment right away. Serious causes of chest pain include: Heart attack. A tear in the body's main blood vessel. Redness and swelling (inflammation) around your heart. Blood clot in your lungs. Other causes of chest pain may not be so serious. These include: Heartburn. Anxiety or stress. Damage to bones or muscles in your chest. Lung infections. Chest pain can feel like: Pain or discomfort in your chest. Crushing, pressure, aching, or squeezing pain. Burning or tingling. Dull or sharp pain that is worse when you move, cough, or take a deep breath. Pain or discomfort that is also felt in your back, neck, jaw, shoulder, or arm, or pain that spreads to any of these areas. It is hard to know whether your pain is caused by something that is serious or something that is not so serious. So it is important to see your doctor right away if you have chest pain. Follow these instructions at home: Medicines Take over-the-counter and prescription medicines only as told by your doctor. If you were prescribed an antibiotic medicine, take it as told by your doctor. Do not stop taking the antibiotic even if you start to feel better. Lifestyle  Rest as told by your doctor. Do not use any products that contain nicotine or tobacco, such as cigarettes, e-cigarettes, and chewing tobacco. If you need help quitting, ask your doctor. Do not drink alcohol. Make lifestyle changes as told by your doctor. These may include: Getting regular exercise. Ask your doctor what activities are safe for you. Eating a heart-healthy diet. A diet and nutrition specialist (dietitian) can help you to learn healthy eating options. Staying at a healthy weight. Treating diabetes or high blood pressure, if needed. Lowering your stress. Activities such as yoga and relaxation techniques  can help. General instructions Pay attention to any changes in your symptoms. Tell your doctor about them or any new symptoms. Avoid any activities that cause chest pain. Keep all follow-up visits as told by your doctor. This is important. You may need more testing if your chest pain does not go away. Contact a doctor if: Your chest pain does not go away. You feel depressed. You have a fever. Get help right away if: Your chest pain is worse. You have a cough that gets worse, or you cough up blood. You have very bad (severe) pain in your belly (abdomen). You pass out (faint). You have either of these for no clear reason: Sudden chest discomfort. Sudden discomfort in your arms, back, neck, or jaw. You have shortness of breath at any time. You suddenly start to sweat, or your skin gets clammy. You feel sick to your stomach (nauseous). You throw up (vomit). You suddenly feel lightheaded or dizzy. You feel very weak or tired. Your heart starts to beat fast, or it feels like it is skipping beats. These symptoms may be an emergency. Do not wait to see if the symptoms will go away. Get medical help right away. Call your local emergency services (911 in the [place] Do not drive yourself to the hospital. Summary Chest pain can be caused by many different conditions. The cause may be serious and need treatment right away. If you have chest pain, see your doctor right away. Follow your doctor's instructions for taking medicines and making lifestyle changes. Keep all follow-up visits as told by your doctor. This includes visits for any further   testing if your chest pain does not go away. Be sure to know the signs that show that your condition has become worse. Get help right away if you have these symptoms. This information is not intended to replace advice given to you by your health care provider. Make sure you discuss any questions you have with your health care provider. Document Revised:  09/11/2020 Document Reviewed: 09/11/2020 Elsevier Patient Education  [place].

## 2021-04-21 NOTE — Progress Notes (Signed)
Subjective:    Patient ID: Linda [name], female    DOB: 07-Jul-1976, 45 y.o.   MRN: [id number]  HPI  Patient presents to the clinic today with complaint of an episode of headaches, lightheadedness, left-sided chest pain, diarrhea, elevated blood pressure and blood sugar.  She reports this occurred on Saturday but worsened on Sunday.  She did call 911.  They came out and did an ECG.  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  This is unchanged from her prior ECG.  Her blood pressure initially was 186/100 which eventually came down to 164/96.  She describes the chest pain as a burning sensation.  The pain did not radiate into her jaw or left arm.  She did not have any sweating, vision changes, shortness of breath, nausea or vomiting.  She reports that EMS told her that she may have angina.  She does not have a history of diabetes.  She does have a history of hypertension, managed on Amlodipine.  She does have a history of anxiety and takes Xanax as needed for this.  She also reports edema in her lower extremities for the last 2 to 3 weeks.  She reports this seems to come and go.  She never wakes up with the swelling, it seems worse throughout the day.  One ankle usually seems more swollen than the other.  She denies pain, numbness, tingling or weakness.  She is on Amlodipine.  She has been trying to consume a low-salt diet.  Review of Systems     Past Medical History:  Diagnosis Date   Alcohol abuse    Anxiety    Asthma    GERD (gastroesophageal reflux disease)    Heart murmur    while pregnant   Hypertension    Migraine     Current Outpatient Medications  Medication Sig Dispense Refill   albuterol (VENTOLIN HFA) 108 (90 Base) MCG/ACT inhaler Inhale 2 puffs into the lungs every 6 (six) hours as needed for wheezing or shortness of breath. 18 g 0   ALPRAZolam (XANAX) 0.5 MG tablet Take 1 tablet (0.5 mg total) by mouth daily as needed for anxiety. 20 tablet 0   ALPRAZolam  (XANAX) 0.5 MG tablet Take 1 tablet (0.5 mg total) by mouth daily as needed. 20 tablet 0   amLODipine (NORVASC) 5 MG tablet TAKE 1 TABLET BY MOUTH EVERY DAY 90 tablet 0   amphetamine-dextroamphetamine (ADDERALL XR) 20 MG 24 hr capsule Take 1 capsule (20 mg total) by mouth every morning. 30 capsule 0   diphenhydrAMINE (BENADRYL) 25 MG tablet Take 25 mg by mouth every 6 (six) hours as needed.     gabapentin (NEURONTIN) 300 MG capsule Take 1 capsule (300 mg total) by mouth 2 (two) times daily for 10 days. 20 capsule 0   predniSONE (DELTASONE) 20 MG tablet Take 2 tablets (40 mg total) by mouth daily with breakfast. 10 tablet 0   SUMAtriptan (IMITREX) 100 MG tablet Take 1 tablet (100 mg total) by mouth every 2 (two) hours as needed for migraine. May repeat in 2 hours if headache persists or recurs. 10 tablet 5   No current facility-administered medications for this visit.    Allergies  Allergen Reactions   Nsaids Other (See Comments)    Family History  Problem Relation Age of Onset   Hypertension Father    Diabetes Father    Hypertension Mother    Cancer Mother  uterine   Stroke Mother    Diabetes Maternal Aunt    Diabetes Maternal Uncle    Cancer Maternal Grandmother        PANCREATIC   Hypertension Maternal Grandmother    Diabetes Maternal Grandmother    Cancer Maternal Grandfather        LUNG   Hypertension Maternal Grandfather    Diabetes Maternal Grandfather    Cancer Paternal Grandmother        THROAT   Alzheimer's disease Paternal Grandmother     Social History   Socioeconomic History   Marital status: Divorced    Spouse name: Not on file   Number of children: Not on file   Years of education: Not on file   Highest education level: Not on file  Occupational History   Not on file  Tobacco Use   Smoking status: Never   Smokeless tobacco: Never  Vaping Use   Vaping Use: Never used  Substance and Sexual Activity   Alcohol use: Yes    Comment: occ   Drug  use: No   Sexual activity: Not on file  Other Topics Concern   Not on file  Social History Narrative   Not on file   Social Determinants of Health   Financial Resource Strain: Not on file  Food Insecurity: Not on file  Transportation Needs: Not on file  Physical Activity: Not on file  Stress: Not on file  Social Connections: Not on file  Intimate Partner Violence: Not on file     Constitutional: Patient reports frequent headaches.  Denies fever, malaise, fatigue, or abrupt weight changes.  HEENT: Denies eye pain, eye redness, ear pain, ringing in the ears, wax buildup, runny nose, nasal congestion, bloody nose, or sore throat. Respiratory: Denies difficulty breathing, shortness of breath, cough or sputum production.   Cardiovascular: Patient reports left-sided chest pain that has now resolved, swelling in her ankles.  Denies chest pain, chest tightness, palpitations or swelling in the hands.  Gastrointestinal: Patient reports diarrhea.  Denies abdominal pain, bloating, constipation, or blood in the stool.  GU: Patient reports increased thirst and urinary frequency.  Denies urgency, pain with urination, burning sensation, blood in urine, odor or discharge. Musculoskeletal: Denies decrease in range of motion, difficulty with gait, muscle pain or joint pain and swelling.  Skin: Denies redness, rashes, lesions or ulcercations.  Neurological: Patient reports lightheadedness.  Denies dizziness, difficulty with memory, difficulty with speech or problems with balance and coordination.  Psych: Patient has a history of anxiety. Denies depression, SI/HI.  No other specific complaints in a complete review of systems (except as listed in HPI above).  Objective:   Physical Exam   BP 121/79 (BP Location: Left Arm, Patient Position: Sitting, Cuff Size: Normal)   Pulse 63   Temp 97.7 F (36.5 C) (Temporal)   Resp 18   Ht '5\' 9"'  (1.753 m)   Wt 194 lb 12.8 oz (88.4 kg)   LMP 07/12/2011   SpO2  100%   BMI 28.77 kg/m   Wt Readings from Last 3 Encounters:  12/04/20 203 lb (92.1 kg)  11/26/20 200 lb 9.6 oz (91 kg)  08/12/20 198 lb (89.8 kg)    General: Appears her stated age, overweight, in NAD. Skin: Warm, dry and intact.  HEENT: Head: normal shape and size; Eyes: EOMs intact; Neck:  Neck supple, trachea midline. No masses, lumps or thyromegaly present.  Cardiovascular: Normal rate and rhythm. S1,S2 noted.  No murmur, rubs or gallops noted.  No JVD or BLE edema. No carotid bruits noted. Pulmonary/Chest: Normal effort and positive vesicular breath sounds. No respiratory distress. No wheezes, rales or ronchi noted.  Musculoskeletal: No difficulty with gait.  Neurological: Alert and oriented.  Psychiatric: Mood and affect normal.  Anxious appearing. Judgment and thought content normal.   BMET    Component Value Date/Time   NA 137 12/04/2020 1411   NA 137 05/31/2013 0846   K 4.4 12/04/2020 1411   K 3.8 05/31/2013 0846   CL 104 12/04/2020 1411   CL 106 05/31/2013 0846   CO2 24 12/04/2020 1411   CO2 26 05/31/2013 0846   GLUCOSE 89 12/04/2020 1411   GLUCOSE 93 05/31/2013 0846   BUN 11 12/04/2020 1411   BUN 10 05/31/2013 0846   CREATININE 0.77 12/04/2020 1411   CALCIUM 8.5 (L) 12/04/2020 1411   CALCIUM 8.2 (L) 05/31/2013 0846   GFRNONAA >60 08/12/2020 1525   GFRNONAA >60 05/31/2013 0846   GFRAA >60 02/08/2020 0600   GFRAA >60 05/31/2013 0846    Lipid Panel     Component Value Date/Time   CHOL 235 (H) 12/04/2020 1411   TRIG 241 (H) 12/04/2020 1411   HDL 84 12/04/2020 1411   CHOLHDL 2.8 12/04/2020 1411   VLDL 35.2 05/17/2019 1606   LDLCALC 114 (H) 12/04/2020 1411    CBC    Component Value Date/Time   WBC 6.0 12/04/2020 1411   RBC 4.30 12/04/2020 1411   HGB 12.5 12/04/2020 1411   HGB 13.3 05/31/2013 0846   HCT 39.3 12/04/2020 1411   HCT 39.2 05/31/2013 0846   PLT 281 12/04/2020 1411   PLT 219 05/31/2013 0846   MCV 91.4 12/04/2020 1411   MCV 88 05/31/2013  0846   MCH 29.1 12/04/2020 1411   MCHC 31.8 (L) 12/04/2020 1411   RDW 12.5 12/04/2020 1411   RDW 12.8 05/31/2013 0846   LYMPHSABS 2.5 08/12/2020 1525   LYMPHSABS 2.1 05/31/2013 0846   MONOABS 0.5 08/12/2020 1525   MONOABS 0.4 05/31/2013 0846   EOSABS 0.0 08/12/2020 1525   EOSABS 0.1 05/31/2013 0846   BASOSABS 0.1 08/12/2020 1525   BASOSABS 0.1 05/31/2013 0846    Hgb A1C Lab Results  Component Value Date   HGBA1C 5.5 12/04/2020           Assessment & Plan:   Frequent Headaches, Lightheadedness, Left-Sided Chest Pain, Diarrhea, Elevated Blood Sugar, Anxiety, History of Sleeve Gastrectomy and HTN:  Reviewed EMS ECG Reviewed previous labs Blood pressure well controlled today on Amlodipine Chest pain does not appear cardiac related Anxiety could be a contributing factor We will check c-Met, TSH, lipid and A1c Could consider referral to cardiology for stress test after lab results are back Will start Pantoprazole 40 mg daily in the setting of history of sleeve gastrectomy  Peripheral Edema:  Likely due to the Amlodipine We will check c-Met and BNP today, if normal consider changing Amlodipine to Losartan Encourage low-salt diet and elevation Could consider echocardiogram for further evaluation if BNP is elevated  We will follow-up after labs with further recommendations and treatment plan Webb Silversmith, NP This visit occurred during the SARS-CoV-2 public health emergency.  Safety protocols were in place, including screening questions prior to the visit, additional usage of staff PPE, and extensive cleaning of exam room while observing appropriate contact time as indicated for disinfecting solutions.

## 2021-04-22 ENCOUNTER — Encounter: Payer: Self-pay | Admitting: Internal Medicine

## 2021-04-22 LAB — LIPID PANEL
Cholesterol: 240 mg/dL — ABNORMAL HIGH (ref <200)
HDL: 109 mg/dL (ref >=50)
LDL Cholesterol (Calc): 102 mg/dL (calc) — ABNORMAL HIGH
Non-HDL Cholesterol (Calc): 131 mg/dL (calc) — ABNORMAL HIGH (ref <130)
Total CHOL/HDL Ratio: 2.2 (calc) (ref <5.0)
Triglycerides: 176 mg/dL — ABNORMAL HIGH (ref <150)

## 2021-04-22 LAB — COMPLETE METABOLIC PANEL WITH GFR
AG Ratio: 1.6 (calc) (ref 1.0–2.5)
ALT: 12 U/L (ref 6–29)
AST: 17 U/L (ref 10–30)
Albumin: 4.2 g/dL (ref 3.6–5.1)
Alkaline phosphatase (APISO): 81 U/L (ref 31–125)
BUN: 7 mg/dL (ref 7–25)
CO2: 29 mmol/L (ref 20–32)
Calcium: 9.2 mg/dL (ref 8.6–10.2)
Chloride: 99 mmol/L (ref 98–110)
Creat: 0.75 mg/dL (ref 0.50–0.99)
Globulin: 2.7 g/dL (calc) (ref 1.9–3.7)
Glucose, Bld: 89 mg/dL (ref 65–99)
Potassium: 3.4 mmol/L — ABNORMAL LOW (ref 3.5–5.3)
Sodium: 137 mmol/L (ref 135–146)
Total Bilirubin: 0.5 mg/dL (ref 0.2–1.2)
Total Protein: 6.9 g/dL (ref 6.1–8.1)
eGFR: 101 mL/min/{1.73_m2} (ref >=60)

## 2021-04-22 LAB — HEMOGLOBIN A1C
Hgb A1c MFr Bld: 5.5 % of total Hgb (ref <5.7)
Mean Plasma Glucose: 111 mg/dL
eAG (mmol/L): 6.2 mmol/L

## 2021-04-22 LAB — TSH: TSH: 3.19 mIU/L

## 2021-04-22 LAB — BRAIN NATRIURETIC PEPTIDE: Brain Natriuretic Peptide: 17 pg/mL (ref <100)

## 2021-04-23 ENCOUNTER — Encounter: Payer: Self-pay | Admitting: Internal Medicine

## 2021-04-24 ENCOUNTER — Other Ambulatory Visit: Payer: Self-pay | Admitting: Internal Medicine

## 2021-04-24 NOTE — Telephone Encounter (Signed)
Requested medication (s) are due for refill today: yes  Requested medication (s) are on the active medication list: yes  Last refill:  03/27/21 #20  Future visit scheduled: yes  Notes to clinic:  Please review for refill. Refill not delegated per protocol    Requested Prescriptions  Pending Prescriptions Disp Refills   ALPRAZolam (XANAX) 0.5 MG tablet [Pharmacy Med Name: ALPRAZOLAM 0.5 MG TABLET] 20 tablet 0    Sig: TAKE 1 TABLET BY MOUTH DAILY AS NEEDED.     Not Delegated - Psychiatry:  Anxiolytics/Hypnotics Failed - 04/24/2021  9:46 AM      Failed - This refill cannot be delegated      Failed - Urine Drug Screen completed in last 360 days      Passed - Valid encounter within last 6 months    Recent Outpatient Visits           3 days ago Left-sided chest pain   [facility] [place], Coralie Keens, NP   4 months ago Encounter for general adult medical examination with abnormal findings   [facility] [place], Coralie Keens, NP   4 months ago Primary hypertension   [place], NP   5 years ago Sciatica of left side   [facility], Synetta Shadow, MD   5 years ago 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       Future Appointments             In 1 month [place], Coralie Keens, NP [facility], [place]   In 2 months [place], Ronalee Red, [place] [facility], [facility]

## 2021-04-25 ENCOUNTER — Other Ambulatory Visit: Payer: Self-pay | Admitting: Internal Medicine

## 2021-04-25 NOTE — Telephone Encounter (Signed)
Requested Prescriptions  Pending Prescriptions Disp Refills  . amLODipine (NORVASC) 5 MG tablet [Pharmacy Med Name: AMLODIPINE BESYLATE 5 MG TAB] 30 tablet 0    Sig: TAKE 1 TABLET BY MOUTH EVERY DAY     Cardiovascular:  Calcium Channel Blockers Passed - 04/25/2021 10:34 AM      Passed - Last BP in normal range    BP Readings from Last 1 Encounters:  04/21/21 121/79         Passed - Valid encounter within last 6 months    Recent Outpatient Visits          4 days ago Left-sided chest pain   [facility] [place], Coralie Keens, NP   4 months ago Encounter for general adult medical examination with abnormal findings   [facility] [place], Coralie Keens, NP   5 months ago Primary hypertension   [place], Coralie Keens, NP   5 years ago Sciatica of left side   [facility], Synetta Shadow, MD   5 years ago 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      Future Appointments            In 1 month [place], Coralie Keens, NP [facility], [place]   In 1 month [place], [place], [place] [facility], [facility]

## 2021-05-01 ENCOUNTER — Other Ambulatory Visit: Payer: Self-pay | Admitting: Internal Medicine

## 2021-05-04 MED ORDER — AMPHETAMINE-DEXTROAMPHET ER 20 MG PO CP24: 20.0000 mg | ORAL_CAPSULE | ORAL | 0 refills | Status: DC

## 2021-05-18 ENCOUNTER — Other Ambulatory Visit: Payer: Self-pay | Admitting: Internal Medicine

## 2021-05-18 NOTE — Telephone Encounter (Signed)
Requested Prescriptions  Pending Prescriptions Disp Refills  . amLODipine (NORVASC) 5 MG tablet [Pharmacy Med Name: AMLODIPINE BESYLATE 5 MG TAB] 30 tablet 0    Sig: TAKE 1 TABLET BY MOUTH EVERY DAY     Cardiovascular:  Calcium Channel Blockers Passed - 05/18/2021 11:31 AM      Passed - Last BP in normal range    BP Readings from Last 1 Encounters:  04/21/21 121/79         Passed - Valid encounter within last 6 months    Recent Outpatient Visits          3 weeks ago Left-sided chest pain   [facility] [place], Coralie Keens, NP   5 months ago Encounter for general adult medical examination with abnormal findings   [facility] [place], Coralie Keens, NP   5 months ago Primary hypertension   [place], Coralie Keens, NP   5 years ago Sciatica of left side   [facility], Synetta Shadow, MD   5 years ago 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      Future Appointments            In 1 week [place], Coralie Keens, NP [facility], [place]   In 1 month [place], [place], [place] [facility], [facility]

## 2021-05-24 ENCOUNTER — Other Ambulatory Visit: Payer: Self-pay | Admitting: Internal Medicine

## 2021-05-25 MED ORDER — ALPRAZOLAM 0.5 MG PO TABS: 0.5000 mg | ORAL_TABLET | Freq: Every day | ORAL | 0 refills | Status: DC | PRN

## 2021-05-28 ENCOUNTER — Ambulatory Visit: Payer: Federal, State, Local not specified - PPO | Admitting: Internal Medicine

## 2021-06-10 ENCOUNTER — Other Ambulatory Visit: Payer: Self-pay | Admitting: Internal Medicine

## 2021-06-12 ENCOUNTER — Other Ambulatory Visit: Payer: Self-pay | Admitting: Internal Medicine

## 2021-06-12 NOTE — Telephone Encounter (Signed)
D/C 11/26/20.

## 2021-06-14 ENCOUNTER — Other Ambulatory Visit: Payer: Self-pay | Admitting: Internal Medicine

## 2021-06-14 NOTE — Telephone Encounter (Signed)
Requested Prescriptions  Pending Prescriptions Disp Refills  . amLODipine (NORVASC) 5 MG tablet [Pharmacy Med Name: AMLODIPINE BESYLATE 5 MG TAB] 90 tablet 1    Sig: TAKE 1 TABLET BY MOUTH EVERY DAY     Cardiovascular:  Calcium Channel Blockers Passed - 06/14/2021  9:33 AM      Passed - Last BP in normal range    BP Readings from Last 1 Encounters:  04/21/21 121/79         Passed - Valid encounter within last 6 months    Recent Outpatient Visits          1 month ago Left-sided chest pain   [facility] [place], Coralie Keens, NP   6 months ago Encounter for general adult medical examination with abnormal findings   [facility] [place], Coralie Keens, NP   6 months ago Primary hypertension   [place], Coralie Keens, NP   5 years ago Sciatica of left side   [facility], Synetta Shadow, MD   5 years ago 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      Future Appointments            In 1 week [place], Ronalee Red, PA-[facility], [place]

## 2021-06-15 ENCOUNTER — Ambulatory Visit: Payer: Federal, State, Local not specified - PPO | Admitting: Internal Medicine

## 2021-06-15 MED ORDER — AMPHETAMINE-DEXTROAMPHET ER 20 MG PO CP24: 20.0000 mg | ORAL_CAPSULE | ORAL | 0 refills | Status: DC

## 2021-06-19 ENCOUNTER — Other Ambulatory Visit: Payer: Self-pay | Admitting: Internal Medicine

## 2021-06-21 ENCOUNTER — Other Ambulatory Visit: Payer: Self-pay | Admitting: Internal Medicine

## 2021-06-22 NOTE — Telephone Encounter (Signed)
Requested medication (s) are due for refill today: Yes  Requested medication (s) are on the active medication list: Yes  Last refill:  05/25/21  Future visit scheduled: Yes  Notes to clinic:  SEE request.    Requested Prescriptions  Pending Prescriptions Disp Refills   ALPRAZolam (XANAX) 0.5 MG tablet [Pharmacy Med Name: ALPRAZOLAM 0.5 MG TABLET] 20 tablet 0    Sig: TAKE 1 TABLET BY MOUTH DAILY AS NEEDED.     Not Delegated - Psychiatry:  Anxiolytics/Hypnotics Failed - 06/21/2021 11:39 PM      Failed - This refill cannot be delegated      Failed - Urine Drug Screen completed in last 360 days      Passed - Valid encounter within last 6 months    Recent Outpatient Visits           2 months ago Left-sided chest pain   [facility] [place], Coralie Keens, NP   6 months ago Encounter for general adult medical examination with abnormal findings   [facility] [place], Coralie Keens, NP   6 months ago Primary hypertension   [place], Coralie Keens, NP   5 years ago Sciatica of left side   [facility], Synetta Shadow, MD   5 years ago 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       Future Appointments             Tomorrow Warren Danes, PA-C [facility], [place]

## 2021-06-23 ENCOUNTER — Ambulatory Visit: Payer: Federal, State, Local not specified - PPO | Admitting: Physician Assistant

## 2021-07-07 ENCOUNTER — Other Ambulatory Visit: Payer: Self-pay | Admitting: Internal Medicine

## 2021-07-08 NOTE — Telephone Encounter (Signed)
Can we do PA.  She has had a gastric sleeve and has failed omeprazole in the past.

## 2021-07-08 NOTE — Telephone Encounter (Signed)
Requested medication (s) are due for refill today:   Yes  Requested medication (s) are on the active medication list:   Yes  Future visit scheduled:   No   Last ordered: Yesterday 12/27 by Webb Silversmith, NP     Returned because pharmacy requesting a PA   Seen phar. note   Requested Prescriptions  Pending Prescriptions Disp Refills   pantoprazole (PROTONIX) 40 MG tablet [Pharmacy Med Name: PANTOPRAZOLE SOD DR 40 MG TAB] 90 tablet 0    Sig: TAKE 1 TABLET BY MOUTH EVERY DAY     Gastroenterology: Proton Pump Inhibitors Passed - 07/07/2021  6:17 PM      Passed - Valid encounter within last 12 months    Recent Outpatient Visits           2 months ago Left-sided chest pain   [facility] [place], Coralie Keens, NP   7 months ago Encounter for general adult medical examination with abnormal findings   [facility] [place], Coralie Keens, NP   7 months ago Primary hypertension   [facility] [place], Coralie Keens, NP   5 years ago Sciatica of left side   [facility], Synetta Shadow, MD   5 years ago 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

## 2021-07-17 ENCOUNTER — Ambulatory Visit: Payer: Federal, State, Local not specified - PPO | Admitting: Internal Medicine

## 2021-07-19 ENCOUNTER — Other Ambulatory Visit: Payer: Self-pay | Admitting: Internal Medicine

## 2021-07-20 MED ORDER — AMPHETAMINE-DEXTROAMPHET ER 20 MG PO CP24: 20.0000 mg | ORAL_CAPSULE | ORAL | 0 refills | Status: DC

## 2021-07-21 ENCOUNTER — Other Ambulatory Visit: Payer: Self-pay | Admitting: Internal Medicine

## 2021-07-21 NOTE — Telephone Encounter (Signed)
Requested medication (s) are due for refill today: yes  Requested medication (s) are on the active medication list: yes  Last refill:  06/22/21  Future visit scheduled: no, canceled 07/17/21 appt.  Notes to clinic:  This medication can not be delegated, please assess.   Requested Prescriptions  Pending Prescriptions Disp Refills   ALPRAZolam (XANAX) 0.5 MG tablet [Pharmacy Med Name: ALPRAZOLAM 0.5 MG TABLET] 20 tablet 0    Sig: TAKE 1 TABLET BY MOUTH EVERY DAY AS NEEDED     Not Delegated - Psychiatry:  Anxiolytics/Hypnotics Failed - 07/21/2021  4:59 AM      Failed - This refill cannot be delegated      Failed - Urine Drug Screen completed in last 360 days      Passed - Valid encounter within last 6 months    Recent Outpatient Visits           3 months ago Left-sided chest pain   [facility] [place], Coralie Keens, NP   7 months ago Encounter for general adult medical examination with abnormal findings   [facility] [place], Coralie Keens, NP   7 months ago Primary hypertension   [place], Coralie Keens, NP   5 years ago Sciatica of left side   [facility], Synetta Shadow, MD   5 years ago 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

## 2021-07-28 ENCOUNTER — Encounter: Payer: Self-pay | Admitting: Internal Medicine

## 2021-07-29 NOTE — Telephone Encounter (Signed)
For your information  

## 2021-07-31 MED ORDER — AMPHETAMINE-DEXTROAMPHET ER 10 MG PO CP24: 10.0000 mg | ORAL_CAPSULE | Freq: Two times a day (BID) | ORAL | 0 refills | Status: DC

## 2021-08-08 ENCOUNTER — Other Ambulatory Visit: Payer: Self-pay

## 2021-08-08 ENCOUNTER — Emergency Department (HOSPITAL_COMMUNITY): Payer: Federal, State, Local not specified - PPO

## 2021-08-08 ENCOUNTER — Emergency Department (HOSPITAL_COMMUNITY)
Admission: EM | Admit: 2021-08-08 | Discharge: 2021-08-08 | Disposition: A | Discharge status: Home / Self Care | Payer: Federal, State, Local not specified - PPO | Attending: Emergency Medicine | Admitting: Emergency Medicine

## 2021-08-08 DIAGNOSIS — S5002XA Contusion of left elbow, initial encounter: Secondary | ICD-10-CM | POA: Diagnosis not present

## 2021-08-08 DIAGNOSIS — W08XXXA Fall from other furniture, initial encounter: Secondary | ICD-10-CM | POA: Diagnosis not present

## 2021-08-08 DIAGNOSIS — W010XXA Fall on same level from slipping, tripping and stumbling without subsequent striking against object, initial encounter: Secondary | ICD-10-CM

## 2021-08-08 DIAGNOSIS — R519 Headache, unspecified: Secondary | ICD-10-CM | POA: Insufficient documentation

## 2021-08-08 DIAGNOSIS — S0990XA Unspecified injury of head, initial encounter: Secondary | ICD-10-CM | POA: Diagnosis not present

## 2021-08-08 DIAGNOSIS — Z981 Arthrodesis status: Secondary | ICD-10-CM | POA: Diagnosis not present

## 2021-08-08 DIAGNOSIS — M25521 Pain in right elbow: Secondary | ICD-10-CM | POA: Diagnosis not present

## 2021-08-08 DIAGNOSIS — S0083XA Contusion of other part of head, initial encounter: Secondary | ICD-10-CM | POA: Diagnosis not present

## 2021-08-08 DIAGNOSIS — S199XXA Unspecified injury of neck, initial encounter: Secondary | ICD-10-CM | POA: Diagnosis not present

## 2021-08-08 DIAGNOSIS — S0093XA Contusion of unspecified part of head, initial encounter: Secondary | ICD-10-CM | POA: Diagnosis not present

## 2021-08-08 DIAGNOSIS — M5033 Other cervical disc degeneration, cervicothoracic region: Secondary | ICD-10-CM | POA: Diagnosis not present

## 2021-08-08 MED ORDER — OXYCODONE-ACETAMINOPHEN 5-325 MG PO TABS
1.0000 | ORAL_TABLET | Freq: Once | ORAL | Status: AC
  Administered 2021-08-08: 1 via ORAL
  Filled 2021-08-08: qty 1

## 2021-08-08 NOTE — ED Provider Notes (Signed)
[place] DEPT Provider Note   CSN: [id number] Arrival date & time: 08/08/21  1749     History  Chief Complaint  Patient presents with   Linda [name] is a 46 y.o. female.  Patient s/p fall yesterday. States was ~ 1 foot up on stepstool, putting things away at home, when fell hitting right elbow and head. Mechanical fall, no faintness or dizziness. No loc with fall. Post fall and head contusion c/o headache, moderate, and right elbow pain/contusion. No nausea/vomiting. No chest pain or sob. No abd pain. Also notes neck pain, no radicular pain. No back pain. No other extremity pain or injury. Skin intact, no lacs. No anticoag use. No numbness/weakness. Also  The history is provided by the patient and medical records.  Fall Associated symptoms include headaches. Pertinent negatives include no chest pain, no abdominal pain and no shortness of breath.      Home Medications Prior to Admission medications   Medication Sig Start Date End Date Taking? Authorizing Provider  albuterol (VENTOLIN HFA) 108 (90 Base) MCG/ACT inhaler Inhale 2 puffs into the lungs every 6 (six) hours as needed for wheezing or shortness of breath. 09/13/19   Jearld Fenton, NP  ALPRAZolam Duanne Moron) 0.5 MG tablet TAKE 1 TABLET BY MOUTH EVERY DAY AS NEEDED 07/21/21   Jearld Fenton, NP  amLODipine (NORVASC) 5 MG tablet TAKE 1 TABLET BY MOUTH EVERY DAY 06/14/21   Jearld Fenton, NP  amphetamine-dextroamphetamine (ADDERALL XR) 10 MG 24 hr capsule Take 1 capsule (10 mg total) by mouth 2 (two) times daily. 07/31/21   Jearld Fenton, NP  pantoprazole (PROTONIX) 40 MG tablet TAKE 1 TABLET BY MOUTH EVERY DAY 07/14/21   Jearld Fenton, NP  SUMAtriptan (IMITREX) 100 MG tablet TAKE 1 TABLET (100 MG TOTAL) BY MOUTH EVERY 2 (TWO) HOURS AS NEEDED FOR MIGRAINE. MAY REPEAT IN 2 HOURS IF HEADACHE PERSISTS OR RECURS. 06/12/21   Jearld Fenton, NP      Allergies    Nsaids    Review of Systems    Review of Systems  Constitutional:  Negative for fever.  HENT:  Negative for nosebleeds.   Eyes:  Negative for pain and visual disturbance.  Respiratory:  Negative for shortness of breath.   Cardiovascular:  Negative for chest pain.  Gastrointestinal:  Negative for abdominal pain and vomiting.  Genitourinary:  Negative for flank pain.  Musculoskeletal:  Positive for neck pain. Negative for back pain.  Skin:  Negative for wound.  Neurological:  Positive for headaches. Negative for speech difficulty, weakness and numbness.  Hematological:  Does not bruise/bleed easily.  Psychiatric/Behavioral:  Negative for confusion.    Physical Exam Updated Vital Signs BP (!) 142/86 (BP Location: Left Arm)    Pulse 70    Temp 98.9 F (37.2 C) (Oral)    Resp 18    Ht 1.753 m (5\' 9" )    Wt 86.2 kg    LMP 07/12/2011    SpO2 100%    BMI 28.06 kg/m  Physical Exam Vitals and nursing note reviewed.  Constitutional:      Appearance: Normal appearance. She is well-developed.  HENT:     Head:     Comments: Tenderness scalp.     Right Ear: Tympanic membrane normal.     Left Ear: Tympanic membrane normal.     Nose: Nose normal.     Mouth/Throat:     Mouth: Mucous membranes are moist.  Eyes:     General: No scleral icterus.    Extraocular Movements: Extraocular movements intact.     Conjunctiva/sclera: Conjunctivae normal.     Pupils: Pupils are equal, round, and reactive to light.  Neck:     Vascular: No carotid bruit.     Trachea: No tracheal deviation.  Cardiovascular:     Rate and Rhythm: Normal rate and regular rhythm.     Pulses: Normal pulses.     Heart sounds: Normal heart sounds. No murmur heard.   No friction rub. No gallop.  Pulmonary:     Effort: Pulmonary effort is normal. No respiratory distress.     Breath sounds: Normal breath sounds.  Chest:     Chest wall: No tenderness.  Abdominal:     General: Bowel sounds are normal. There is no distension.     Palpations: Abdomen is  soft.     Tenderness: There is no abdominal tenderness.  Genitourinary:    Comments: No cva tenderness.  Musculoskeletal:        General: No swelling.     Cervical back: Normal range of motion and neck supple. No rigidity. No muscular tenderness.     Comments: Mid cervical tenderness, otherwise, CTLS spine, non tender, aligned, no step off. Tenderness right elbow, otherwise good rom bilateral extremities without pain or focal bony tenderness. Distal pulses palp. Skin intact.   Skin:    General: Skin is warm and dry.     Findings: No rash.  Neurological:     Mental Status: She is alert.     Comments: Alert, speech normal. GCS 15. Motor/sens grossly intact bil. Steady gait.   Psychiatric:        Mood and Affect: Mood normal.    ED Results / Procedures / Treatments   Labs (all labs ordered are listed, but only abnormal results are displayed) Labs Reviewed - No data to display  EKG None  Radiology DG Elbow Complete Right  Result Date: 08/08/2021 CLINICAL DATA:  Fall, right elbow pain EXAM: RIGHT ELBOW - COMPLETE 3+ VIEW COMPARISON:  X-ray 12/19/2017 FINDINGS: There is no evidence of fracture, dislocation, or joint effusion. 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 No other focal bone abnormality. Soft tissues are unremarkable. IMPRESSION: Negative. Electronically Signed   By: Davina Poke D.O.   On: 08/08/2021 18:42   CT Head Wo Contrast  Result Date: 08/08/2021 CLINICAL DATA:  Trauma. EXAM: CT HEAD WITHOUT CONTRAST CT CERVICAL SPINE WITHOUT CONTRAST TECHNIQUE: Multidetector CT imaging of the head and cervical spine was performed following the standard protocol without intravenous contrast. Multiplanar CT image reconstructions of the cervical spine were also generated. RADIATION DOSE REDUCTION: This exam was performed according to the departmental dose-optimization program which includes automated exposure control, adjustment of the mA and/or kV according to patient size  and/or use of iterative reconstruction technique. COMPARISON:  CT head 12/08/2018. cervical spine CT 05/31/2013. FINDINGS: CT HEAD FINDINGS Brain: No evidence of acute infarction, hemorrhage, hydrocephalus, extra-axial collection or mass lesion/mass effect. Vascular: No hyperdense vessel or unexpected calcification. Skull: Normal. Negative for fracture or focal lesion. Sinuses/Orbits: Bilateral mastoid effusions. Paranasal sinuses are otherwise clear. Other: None. CT CERVICAL SPINE FINDINGS Alignment: Normal. Skull base and vertebrae: No acute fracture. No primary bone lesion or focal pathologic process. 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 Soft tissues and spinal canal: No prevertebral fluid or swelling. No visible canal hematoma. Disc levels: Degenerative disc space sclerosis and mild disc  space narrowing have progressed at C7-T1. No significant central canal or neural foraminal stenosis at any level. Upper chest: Negative. Other: None. IMPRESSION: 1.  No acute intracranial process. 2. No acute fracture or traumatic subluxation of the cervical spine. 3.  Bilateral mastoid effusions. Electronically Signed   By: Ronney Asters M.D.   On: 08/08/2021 19:02   CT Cervical Spine Wo Contrast  Result Date: 08/08/2021 CLINICAL DATA:  Trauma. EXAM: CT HEAD WITHOUT CONTRAST CT CERVICAL SPINE WITHOUT CONTRAST TECHNIQUE: Multidetector CT imaging of the head and cervical spine was performed following the standard protocol without intravenous contrast. Multiplanar CT image reconstructions of the cervical spine were also generated. RADIATION DOSE REDUCTION: This exam was performed according to the departmental dose-optimization program which includes automated exposure control, adjustment of the mA and/or kV according to patient size and/or use of iterative reconstruction technique. COMPARISON:  CT head 12/08/2018. cervical spine CT 05/31/2013. FINDINGS: CT HEAD FINDINGS Brain: No evidence of  acute infarction, hemorrhage, hydrocephalus, extra-axial collection or mass lesion/mass effect. Vascular: No hyperdense vessel or unexpected calcification. Skull: Normal. Negative for fracture or focal lesion. Sinuses/Orbits: Bilateral mastoid effusions. Paranasal sinuses are otherwise clear. Other: None. CT CERVICAL SPINE FINDINGS Alignment: Normal. Skull base and vertebrae: No acute fracture. No primary bone lesion or focal pathologic process. 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 Soft tissues and spinal canal: No prevertebral fluid or swelling. No visible canal hematoma. Disc levels: Degenerative disc space sclerosis and mild disc space narrowing have progressed at C7-T1. No significant central canal or neural foraminal stenosis at any level. Upper chest: Negative. Other: None. IMPRESSION: 1.  No acute intracranial process. 2. No acute fracture or traumatic subluxation of the cervical spine. 3.  Bilateral mastoid effusions. Electronically Signed   By: Ronney Asters M.D.   On: 08/08/2021 19:02    Procedures Procedures    Medications Ordered in ED Medications  oxyCODONE-acetaminophen (PERCOCET/ROXICET) 5-325 MG per tablet 1 tablet (has no administration in time range)    ED Course/ Medical Decision Making/ A&P                           Medical Decision Making Problems Addressed: Contusion of head, initial encounter: acute illness or injury that poses a threat to life or bodily functions Contusion of left elbow, initial encounter: acute illness or injury Fall from slip, trip, or stumble, initial encounter: acute illness or injury  Amount and/or Complexity of Data Reviewed Radiology: ordered and independent interpretation performed. Decision-making details documented in ED Course.  Risk Prescription drug management.   Imaging ordered.   Reviewed nursing notes and prior charts for additional history. External reports reviewed.   Xrays reviewed/interpreted by  me - no fx.   CT reviewed/interpreted by me - no hem.   Pt indicates has ride, does not have to drive. Last took acetaminophen this AM. Percocet 1 po.           Final Clinical Impression(s) / ED Diagnoses Final diagnoses:  None    Rx / DC Orders ED Discharge Orders     None         Lajean Saver, MD 08/08/21 1914

## 2021-08-08 NOTE — Discharge Instructions (Signed)
It was our pleasure to provide your ER care today - we hope that you feel better.  Take acetaminophen as need.   Follow up with primary care doctor in one week if symptoms fail to improve/resolve.  Return to ER if worse, new symptoms, new/severe pain, or other concern.  You were given pain meds in the ER - no driving for the next 6 hours.

## 2021-08-08 NOTE — ED Triage Notes (Signed)
Patient reports falling off stool yesterday, hit back of head, right elbow. Pain rated 9/10. Patient says when she woke up this morning vision was blurry. Unable to drive to ED. Patient says whole body is sore

## 2021-08-08 NOTE — ED Notes (Signed)
Pt A&OX4 ambulatory at d/c with independent steady gait. Pt verbalized understanding of d/c instructions and follow up care. 

## 2021-08-17 ENCOUNTER — Other Ambulatory Visit: Payer: Self-pay | Admitting: Internal Medicine

## 2021-08-17 MED ORDER — ALPRAZOLAM 0.5 MG PO TABS: 0.5000 mg | ORAL_TABLET | Freq: Every day | ORAL | 0 refills | Status: DC | PRN

## 2021-08-18 NOTE — Telephone Encounter (Signed)
Requested medications are due for refill today.  no  Requested medications are on the active medications list.  yes  Last refill. 08/17/2021 #20  Future visit scheduled.   no  Notes to clinic.  Duplicate request. Already refilled.     Requested Prescriptions  Pending Prescriptions Disp Refills   ALPRAZolam (XANAX) 0.5 MG tablet [Pharmacy Med Name: ALPRAZOLAM 0.5 MG TABLET] 20 tablet 0    Sig: TAKE 1 TABLET BY MOUTH EVERY DAY AS NEEDED     Not Delegated - Psychiatry: Anxiolytics/Hypnotics 2 Failed - 08/17/2021  9:49 AM      Failed - This refill cannot be delegated      Failed - Urine Drug Screen completed in last 360 days      Passed - Patient is not pregnant      Passed - Valid encounter within last 6 months    Recent Outpatient Visits           3 months ago Left-sided chest pain   [facility] [place], Coralie Keens, NP   8 months ago Encounter for general adult medical examination with abnormal findings   [facility] [place], Coralie Keens, NP   8 months ago Primary hypertension   [place], Coralie Keens, NP   5 years ago Sciatica of left side   [facility], Synetta Shadow, MD   6 years ago 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

## 2021-09-08 ENCOUNTER — Other Ambulatory Visit: Payer: Self-pay | Admitting: Internal Medicine

## 2021-09-08 ENCOUNTER — Telehealth: Payer: Federal, State, Local not specified - PPO | Admitting: Physician Assistant

## 2021-09-08 DIAGNOSIS — B9689 Other specified bacterial agents as the cause of diseases classified elsewhere: Secondary | ICD-10-CM | POA: Diagnosis not present

## 2021-09-08 DIAGNOSIS — J069 Acute upper respiratory infection, unspecified: Secondary | ICD-10-CM | POA: Diagnosis not present

## 2021-09-08 MED ORDER — DOXYCYCLINE HYCLATE 100 MG PO TABS: 100.0000 mg | ORAL_TABLET | Freq: Two times a day (BID) | ORAL | 0 refills | Status: DC

## 2021-09-08 MED ORDER — FLUCONAZOLE 150 MG PO TABS: 150.0000 mg | ORAL_TABLET | Freq: Once | ORAL | 0 refills | Status: AC

## 2021-09-08 MED ORDER — BENZONATATE 100 MG PO CAPS: 100.0000 mg | ORAL_CAPSULE | Freq: Three times a day (TID) | ORAL | 0 refills | Status: DC | PRN

## 2021-09-08 MED ORDER — AMPHETAMINE-DEXTROAMPHET ER 10 MG PO CP24: 10.0000 mg | ORAL_CAPSULE | Freq: Two times a day (BID) | ORAL | 0 refills | Status: DC

## 2021-09-08 NOTE — Addendum Note (Signed)
Addended by: Brunetta Jeans on: 09/08/2021 07:08 AM   Modules accepted: Orders

## 2021-09-08 NOTE — Progress Notes (Signed)
E-Visit for Sinus Problems  We are sorry that you are not feeling well.  Here is how we plan to help!  Based on what you have shared with me it looks like you have sinusitis.  Sinusitis is inflammation and infection in the sinus cavities of the head.  Based on your presentation I believe you most likely have Acute Bacterial Sinusitis.  This is an infection caused by bacteria and is treated with antibiotics. I have prescribed Doxycycline 100mg  by mouth twice a day for 10 days. You may use an oral decongestant such as Mucinex D or if you have glaucoma or high blood pressure use plain Mucinex. Saline nasal spray help and can safely be used as often as needed for congestion.  If you develop worsening sinus pain, fever or notice severe headache and vision changes, or if symptoms are not better after completion of antibiotic, please schedule an appointment with a health care provider.    I have sent in a prescription cough medication as well to take as directed.   Sinus infections are not as easily transmitted as other respiratory infection, however we still recommend that you avoid close contact with loved ones, especially the very young and elderly.  Remember to wash your hands thoroughly throughout the day as this is the number one way to prevent the spread of infection!  Home Care: Only take medications as instructed by your medical team. Complete the entire course of an antibiotic. Do not take these medications with alcohol. A steam or ultrasonic humidifier can help congestion.  You can place a towel over your head and breathe in the steam from hot water coming from a faucet. Avoid close contacts especially the very young and the elderly. Cover your mouth when you cough or sneeze. Always remember to wash your hands.  Get Help Right Away If: You develop worsening fever or sinus pain. You develop a severe head ache or visual changes. Your symptoms persist after you have completed your treatment  plan.  Make sure you Understand these instructions. Will watch your condition. Will get help right away if you are not doing well or get worse.  Thank you for choosing an e-visit.  Your e-visit answers were reviewed by a board certified advanced clinical practitioner to complete your personal care plan. Depending upon the condition, your plan could have included both over the counter or prescription medications.  Please review your pharmacy choice. Make sure the pharmacy is open so you can pick up prescription now. If there is a problem, you may contact your provider through [employer] and have the prescription routed to another pharmacy.  Your safety is important to [place]. If you have drug allergies check your prescription carefully.   For the next 24 hours you can use MyChart to ask questions about today's visit, request a non-urgent call back, or ask for a work or school excuse. You will get an email in the next two days asking about your experience. I hope that your e-visit has been valuable and will speed your recovery.

## 2021-09-08 NOTE — Progress Notes (Signed)
I have spent 5 minutes in review of e-visit questionnaire, review and updating patient chart, medical decision making and response to patient.   Linda [name] Cody Hilario Robarts, PA-C    

## 2021-09-10 ENCOUNTER — Encounter: Payer: Self-pay | Admitting: Internal Medicine

## 2021-09-10 ENCOUNTER — Other Ambulatory Visit: Payer: Self-pay | Admitting: Family

## 2021-09-11 MED ORDER — AMPHETAMINE-DEXTROAMPHET ER 10 MG PO CP24: 10.0000 mg | ORAL_CAPSULE | Freq: Two times a day (BID) | ORAL | 0 refills | Status: DC

## 2021-09-12 ENCOUNTER — Other Ambulatory Visit: Payer: Self-pay | Admitting: Internal Medicine

## 2021-09-14 MED ORDER — ALPRAZOLAM 0.5 MG PO TABS: 0.5000 mg | ORAL_TABLET | Freq: Every day | ORAL | 0 refills | Status: DC | PRN

## 2021-10-15 ENCOUNTER — Other Ambulatory Visit: Payer: Self-pay | Admitting: Internal Medicine

## 2021-10-15 MED ORDER — ALPRAZOLAM 0.5 MG PO TABS: 0.5000 mg | ORAL_TABLET | Freq: Every day | ORAL | 0 refills | Status: DC | PRN

## 2021-10-16 ENCOUNTER — Other Ambulatory Visit: Payer: Self-pay | Admitting: Internal Medicine

## 2021-10-16 NOTE — Telephone Encounter (Signed)
Requested Prescriptions  ?Pending Prescriptions Disp Refills  ?? pantoprazole (PROTONIX) 40 MG tablet [Pharmacy Med Name: PANTOPRAZOLE SOD DR 40 MG TAB] 90 tablet 0  ?  Sig: TAKE 1 TABLET BY MOUTH EVERY DAY  ?  ? Gastroenterology: Proton Pump Inhibitors Passed - 10/16/2021  2:28 AM  ?  ?  Passed - Valid encounter within last 12 months  ?  Recent Outpatient Visits   ?      ? 5 months ago Left-sided chest pain  ? [facility] [place], Coralie Keens, NP  ? 10 months ago Encounter for general adult medical examination with abnormal findings  ? [facility] [place], [place] W, NP  ? 10 months ago Primary hypertension  ? [facility] [place], [place], NP  ? 6 years ago Sciatica of left side  ? [facility], Synetta Shadow, MD  ? 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 [facility], Janalee Dane, MD  ?  ?  ? ?  ?  ?  ? ? ?

## 2021-11-01 ENCOUNTER — Telehealth: Payer: Federal, State, Local not specified - PPO | Admitting: Family

## 2021-11-01 DIAGNOSIS — B3731 Acute candidiasis of vulva and vagina: Secondary | ICD-10-CM

## 2021-11-01 MED ORDER — FLUCONAZOLE 150 MG PO TABS: 150.0000 mg | ORAL_TABLET | ORAL | 0 refills | Status: DC | PRN

## 2021-11-01 NOTE — Progress Notes (Signed)
E-Visit for Vaginal Symptoms ? ?We are sorry that you are not feeling well. Here is how we plan to help! ?Based on what you shared with me it looks like you: May have a yeast vaginosis ? ?Vaginosis is an inflammation of the vagina that can result in discharge, itching and pain. The cause is usually a change in the normal balance of vaginal bacteria or an infection. Vaginosis can also result from reduced estrogen levels after menopause. ? ?The most common causes of vaginosis are: ? ? Bacterial vaginosis which results from an overgrowth of one on several organisms that are normally present in your vagina. ? ? Yeast infections which are caused by a naturally occurring fungus called candida. ? ? Vaginal atrophy (atrophic vaginosis) which results from the thinning of the vagina from reduced estrogen levels after menopause. ? ? Trichomoniasis which is caused by a parasite and is commonly transmitted by sexual intercourse. ? ?Factors that increase your risk of developing vaginosis include: ?Medications, such as antibiotics and steroids ?Uncontrolled diabetes ?Use of hygiene products such as bubble bath, vaginal spray or vaginal deodorant ?Douching ?Wearing damp or tight-fitting clothing ?Using an intrauterine device (IUD) for birth control ?Hormonal changes, such as those associated with pregnancy, birth control pills or menopause ?Sexual activity ?Having a sexually transmitted infection ? ?Your treatment plan is A single Diflucan (fluconazole) 150mg tablet once.  I have electronically sent this prescription into the pharmacy that you have chosen. ? ?Be sure to take all of the medication as directed. Stop taking any medication if you develop a rash, tongue swelling or shortness of breath. Mothers who are breast feeding should consider pumping and discarding their breast milk while on these antibiotics. However, there is no consensus that infant exposure at these doses would be harmful.  ?Remember that medication creams can  weaken latex condoms. ?. ? ? ?HOME CARE: ? ?Good hygiene may prevent some types of vaginosis from recurring and may relieve some symptoms: ? ?Avoid baths, hot tubs and whirlpool spas. Rinse soap from your outer genital area after a shower, and dry the area well to prevent irritation. Don't use scented or harsh soaps, such as those with deodorant or antibacterial action. ?Avoid irritants. These include scented tampons and pads. ?Wipe from front to back after using the toilet. Doing so avoids spreading fecal bacteria to your vagina. ? ?Other things that may help prevent vaginosis include: ? ?Don't douche. Your vagina doesn't require cleansing other than normal bathing. Repetitive douching disrupts the normal organisms that reside in the vagina and can actually increase your risk of vaginal infection. Douching won't clear up a vaginal infection. ?Use a latex condom. Both female and female latex condoms may help you avoid infections spread by sexual contact. ?Wear cotton underwear. Also wear pantyhose with a cotton crotch. If you feel comfortable without it, skip wearing underwear to bed. Yeast thrives in moist environments ?Your symptoms should improve in the next day or two. ? ?GET HELP RIGHT AWAY IF: ? ?You have pain in your lower abdomen ( pelvic area or over your ovaries) ?You develop nausea or vomiting ?You develop a fever ?Your discharge changes or worsens ?You have persistent pain with intercourse ?You develop shortness of breath, a rapid pulse, or you faint. ? ?These symptoms could be signs of problems or infections that need to be evaluated by a medical provider now. ? ?MAKE SURE YOU  ? ?Understand these instructions. ?Will watch your condition. ?Will get help right away if you are not   doing well or get worse.  Thank you for choosing an e-visit.  Your e-visit answers were reviewed by a board certified advanced clinical practitioner to complete your personal care plan. Depending upon the condition, your plan  could have included both over the counter or prescription medications.  Please review your pharmacy choice. Make sure the pharmacy is open so you can pick up prescription now. If there is a problem, you may contact your provider through MyChart messaging and have the prescription routed to another pharmacy.  Your safety is important to us. If you have drug allergies check your prescription carefully.   For the next 24 hours you can use MyChart to ask questions about today's visit, request a non-urgent call back, or ask for a work or school excuse. You will get an email in the next two days asking about your experience. I hope that your e-visit has been valuable and will speed your recovery.  Approximately 5 minutes was spent documenting and reviewing patient's chart.    

## 2021-11-03 ENCOUNTER — Other Ambulatory Visit: Payer: Self-pay | Admitting: Internal Medicine

## 2021-11-03 MED ORDER — AMPHETAMINE-DEXTROAMPHET ER 10 MG PO CP24: 10.0000 mg | ORAL_CAPSULE | Freq: Two times a day (BID) | ORAL | 0 refills | Status: DC

## 2021-11-12 ENCOUNTER — Other Ambulatory Visit: Payer: Self-pay | Admitting: Internal Medicine

## 2021-11-12 MED ORDER — ALPRAZOLAM 0.5 MG PO TABS: 0.5000 mg | ORAL_TABLET | Freq: Every day | ORAL | 0 refills | Status: DC | PRN

## 2021-11-22 ENCOUNTER — Telehealth: Payer: Federal, State, Local not specified - PPO | Admitting: Physician Assistant

## 2021-11-22 DIAGNOSIS — B029 Zoster without complications: Secondary | ICD-10-CM | POA: Diagnosis not present

## 2021-11-22 DIAGNOSIS — B0229 Other postherpetic nervous system involvement: Secondary | ICD-10-CM | POA: Diagnosis not present

## 2021-11-22 NOTE — Progress Notes (Signed)
Message sent to patient requesting further input regarding current symptoms. Awaiting patient response.  

## 2021-11-23 MED ORDER — VALACYCLOVIR HCL 1 G PO TABS: 1000.0000 mg | ORAL_TABLET | Freq: Three times a day (TID) | ORAL | 0 refills | Status: AC

## 2021-11-23 MED ORDER — GABAPENTIN 300 MG PO CAPS: 300.0000 mg | ORAL_CAPSULE | Freq: Two times a day (BID) | ORAL | 0 refills | Status: DC

## 2021-11-23 NOTE — Addendum Note (Signed)
Addended by: Brunetta Jeans on: 11/23/2021 06:49 AM ? ? Modules accepted: Orders ? ?

## 2021-11-23 NOTE — Progress Notes (Signed)
E-visit for Shingles   We are sorry that you are not feeling well. Here is how we plan to help!  Based on what you shared with me it looks like you have shingles.  Shingles or herpes zoster, is a common infection of the nerves.  It is a painful rash caused by the herpes zoster virus.  This is the same virus that causes chickenpox.  After a person has chickenpox, the virus remains inactive in the nerve cells.  Years later, the virus can become active again and travel to the skin.  It typically will appear on one side of the face or body.  Burning or shooting pain, tingling, or itching are early signs of the infection.  Blisters typically scab over in 7 to 10 days and clear up within 2-4 weeks. Shingles is only contagious to people that have never had the chickenpox, the chickenpox vaccine, or anyone who has a compromised immune system.  You should avoid contact with these type of people until your blisters scab over.  I have prescribed Valacyclovir 1g three times daily for 7 days and also Gabapentin 300mg twice daily as needed for pain   HOME CARE: . Apply ice packs (wrapped in a thin towel), cool compresses, or soak in cool bath to help reduce pain. . Use calamine lotion to calm itchy skin. . Avoid scratching the rash. . Avoid direct sunlight.  GET HELP RIGHT AWAY IF: . Symptoms that don't away after treatment. . A rash or blisters near your eye. . Increased drainage, fever, or rash after treatment. . Severe pain that doesn't go away.   MAKE SURE YOU    Understand these instructions.  Will watch your condition.  Will get help right away if you are not doing well or get worse.  Thank you for choosing an e-visit. Your e-visit answers were reviewed by a board certified advanced clinical practitioner to complete your personal care plan. Depending upon the condition, your plan could have included both over the counter or prescription medications.  Please review your pharmacy choice.  Make sure the pharmacy is open so you can pick up prescription now. If there is a problem, you may contact your provider through MyChart messaging and have the prescription routed to another pharmacy.  Your safety is important to us. If you have drug allergies check your prescription carefully.   For the next 24 hours you can use MyChart to ask questions about today's visit, request a non-urgent call back, or ask for a work or school excuse.  You will get an email in the next two days asking about your experience. I hope that your e-visit has been valuable and will speed your recovery  

## 2021-11-23 NOTE — Progress Notes (Signed)
I have spent 5 minutes in review of e-visit questionnaire, review and updating patient chart, medical decision making and response to patient.   Misheel Gowans Cody Marigold Mom, PA-C    

## 2021-11-26 DIAGNOSIS — S93402A Sprain of unspecified ligament of left ankle, initial encounter: Secondary | ICD-10-CM | POA: Diagnosis not present

## 2021-11-26 DIAGNOSIS — S92125A Nondisplaced fracture of body of left talus, initial encounter for closed fracture: Secondary | ICD-10-CM | POA: Diagnosis not present

## 2021-12-05 ENCOUNTER — Telehealth: Payer: Federal, State, Local not specified - PPO | Admitting: Physician Assistant

## 2021-12-05 DIAGNOSIS — R21 Rash and other nonspecific skin eruption: Secondary | ICD-10-CM | POA: Diagnosis not present

## 2021-12-05 MED ORDER — MUPIROCIN 2 % EX OINT: TOPICAL_OINTMENT | CUTANEOUS | 0 refills | Status: DC

## 2021-12-05 NOTE — Progress Notes (Signed)
E Visit for Rash  We are sorry that you are not feeling well. Here is how we plan to help!  Based upon what you have shared with me it looks like you have a bacterial follicultits.  Folliculitis is inflammation of the hair follicles that can be caused by a superficial infection of the skin and is treated with an antibiotic. I have prescribed: topical mupirocin  HOME CARE:  Take cool showers and avoid direct sunlight. Apply cool compress or wet dressings. Take a bath in an oatmeal bath.  Sprinkle content of one Aveeno packet under running faucet with comfortably warm water.  Bathe for 15-20 minutes, 1-2 times daily.  Pat dry with a towel. Do not rub the rash. Use hydrocortisone cream. Take an antihistamine like Benadryl for widespread rashes that itch.  The adult dose of Benadryl is 25-50 mg by mouth 4 times daily. Caution:  This type of medication may cause sleepiness.  Do not drink alcohol, drive, or operate dangerous machinery while taking antihistamines.  Do not take these medications if you have prostate enlargement.  Read package instructions thoroughly on all medications that you take.  GET HELP RIGHT AWAY IF:  Symptoms don't go away after treatment. Severe itching that persists. If you rash spreads or swells. If you rash begins to smell. If it blisters and opens or develops a yellow-brown crust. You develop a fever. You have a sore throat. You become short of breath.  MAKE SURE YOU:  Understand these instructions. Will watch your condition. Will get help right away if you are not doing well or get worse.  Thank you for choosing an e-visit.  Your e-visit answers were reviewed by a board certified advanced clinical practitioner to complete your personal care plan. Depending upon the condition, your plan could have included both over the counter or prescription medications.  Please review your pharmacy choice. Make sure the pharmacy is open so you can pick up prescription now.  If there is a problem, you may contact your provider through [employer] and have the prescription routed to another pharmacy.  Your safety is important to [place]. If you have drug allergies check your prescription carefully.   For the next 24 hours you can use MyChart to ask questions about today's visit, request a non-urgent call back, or ask for a work or school excuse. You will get an email in the next two days asking about your experience. I hope that your e-visit has been valuable and will speed your recovery.  This appointment required 5-10 minutes of patient care (this includes precharting, chart review, review of results, face-to-face care, etc.).  Inda Coke PA-C

## 2021-12-08 DIAGNOSIS — M25572 Pain in left ankle and joints of left foot: Secondary | ICD-10-CM | POA: Insufficient documentation

## 2021-12-11 ENCOUNTER — Encounter (HOSPITAL_COMMUNITY): Payer: Self-pay | Admitting: *Deleted

## 2021-12-11 DIAGNOSIS — M25572 Pain in left ankle and joints of left foot: Secondary | ICD-10-CM | POA: Diagnosis not present

## 2021-12-13 ENCOUNTER — Other Ambulatory Visit: Payer: Self-pay | Admitting: Internal Medicine

## 2021-12-14 ENCOUNTER — Encounter: Payer: Self-pay | Admitting: Internal Medicine

## 2021-12-14 MED ORDER — ALPRAZOLAM 0.5 MG PO TABS: 0.5000 mg | ORAL_TABLET | Freq: Every day | ORAL | 0 refills | Status: DC | PRN

## 2021-12-14 NOTE — Telephone Encounter (Signed)
Please review.  Pt has an appointment scheduled for 12/17/2021.   Thanks,   -Mickel Baas

## 2021-12-14 NOTE — Telephone Encounter (Signed)
Pt called back and scheduled next CPE for Thursday

## 2021-12-17 ENCOUNTER — Encounter: Payer: Self-pay | Admitting: Internal Medicine

## 2021-12-17 ENCOUNTER — Ambulatory Visit (INDEPENDENT_AMBULATORY_CARE_PROVIDER_SITE_OTHER): Payer: Federal, State, Local not specified - PPO | Admitting: Internal Medicine

## 2021-12-17 VITALS — BP 124/81 | HR 84 | Temp 96.9°F | Wt 193.0 lb

## 2021-12-17 DIAGNOSIS — Z79899 Other long term (current) drug therapy: Secondary | ICD-10-CM

## 2021-12-17 DIAGNOSIS — E782 Mixed hyperlipidemia: Secondary | ICD-10-CM | POA: Diagnosis not present

## 2021-12-17 DIAGNOSIS — E663 Overweight: Secondary | ICD-10-CM

## 2021-12-17 DIAGNOSIS — Z6828 Body mass index (BMI) 28.0-28.9, adult: Secondary | ICD-10-CM | POA: Diagnosis not present

## 2021-12-17 DIAGNOSIS — Z0001 Encounter for general adult medical examination with abnormal findings: Secondary | ICD-10-CM

## 2021-12-17 MED ORDER — ESOMEPRAZOLE MAGNESIUM 40 MG PO CPDR: 40.0000 mg | DELAYED_RELEASE_CAPSULE | Freq: Every day | ORAL | 1 refills | Status: DC

## 2021-12-17 MED ORDER — ALBUTEROL SULFATE HFA 108 (90 BASE) MCG/ACT IN AERS: 2.0000 | INHALATION_SPRAY | Freq: Four times a day (QID) | RESPIRATORY_TRACT | 0 refills | Status: DC | PRN

## 2021-12-17 NOTE — Assessment & Plan Note (Signed)
Encourage diet and exercise for weight loss 

## 2021-12-17 NOTE — Progress Notes (Signed)
Subjective:    Patient ID: Linda [name], female    DOB: 10-May-1976, 46 y.o.   MRN: [id number]  HPI  Patient presents the clinic today for her annual exam.  Flu: 04/2021 Tetanus: 08/2015 COVID: Pfizer x3 Mammogram: 04/2019 Pap smear: Hysterectomy Colon screening: Never Vision screening: annually Dentist: as needed  Diet: She does eat meat. She consumes fruits and veggies. She does eat some fried foods. She drinks mostly water. Exercise: None  Review of Systems     Past Medical History:  Diagnosis Date   Alcohol abuse    Anxiety    Asthma    GERD (gastroesophageal reflux disease)    Heart murmur    while pregnant   Hypertension    Migraine     Current Outpatient Medications  Medication Sig Dispense Refill   albuterol (VENTOLIN HFA) 108 (90 Base) MCG/ACT inhaler Inhale 2 puffs into the lungs every 6 (six) hours as needed for wheezing or shortness of breath. 18 g 0   ALPRAZolam (XANAX) 0.5 MG tablet Take 1 tablet (0.5 mg total) by mouth daily as needed. Please schedule an office visit before anymore refills. 20 tablet 0   amLODipine (NORVASC) 5 MG tablet TAKE 1 TABLET BY MOUTH EVERY DAY 90 tablet 1   amphetamine-dextroamphetamine (ADDERALL XR) 10 MG 24 hr capsule Take 1 capsule (10 mg total) by mouth 2 (two) times daily. 60 capsule 0   benzonatate (TESSALON) 100 MG capsule Take 1 capsule (100 mg total) by mouth 3 (three) times daily as needed for cough. 30 capsule 0   doxycycline (VIBRA-TABS) 100 MG tablet Take 1 tablet (100 mg total) by mouth 2 (two) times daily. 20 tablet 0   fluconazole (DIFLUCAN) 150 MG tablet Take 1 tablet (150 mg total) by mouth every three (3) days as needed. 2 tablet 0   gabapentin (NEURONTIN) 300 MG capsule Take 1 capsule (300 mg total) by mouth 2 (two) times daily. 60 capsule 0   mupirocin ointment (BACTROBAN) 2 % Apply to affected area 1-2 times daily 22 g 0   pantoprazole (PROTONIX) 40 MG tablet TAKE 1 TABLET BY MOUTH EVERY DAY 90 tablet 0    SUMAtriptan (IMITREX) 100 MG tablet TAKE 1 TABLET (100 MG TOTAL) BY MOUTH EVERY 2 (TWO) HOURS AS NEEDED FOR MIGRAINE. MAY REPEAT IN 2 HOURS IF HEADACHE PERSISTS OR RECURS. 10 tablet 5   No current facility-administered medications for this visit.    Allergies  Allergen Reactions   Nsaids Other (See Comments)    Family History  Problem Relation Age of Onset   Hypertension Father    Diabetes Father    Hypertension Mother    Cancer Mother        uterine   Stroke Mother    Diabetes Maternal Aunt    Diabetes Maternal Uncle    Cancer Maternal Grandmother        PANCREATIC   Hypertension Maternal Grandmother    Diabetes Maternal Grandmother    Cancer Maternal Grandfather        LUNG   Hypertension Maternal Grandfather    Diabetes Maternal Grandfather    Cancer Paternal Grandmother        THROAT   Alzheimer's disease Paternal Grandmother     Social History   Socioeconomic History   Marital status: Divorced    Spouse name: Not on file   Number of children: Not on file   Years of education: Not on file   Highest education level: Not on  file  Occupational History   Not on file  Tobacco Use   Smoking status: Never   Smokeless tobacco: Never  Vaping Use   Vaping Use: Never used  Substance and Sexual Activity   Alcohol use: Yes    Comment: occ   Drug use: No   Sexual activity: Not on file  Other Topics Concern   Not on file  Social History Narrative   Not on file   Social Determinants of Health   Financial Resource Strain: Not on file  Food Insecurity: Not on file  Transportation Needs: Not on file  Physical Activity: Not on file  Stress: Not on file  Social Connections: Not on file  Intimate Partner Violence: Not on file     Constitutional: Patient reports intermittent headaches.  Denies fever, malaise, fatigue, or abrupt weight changes.  HEENT: Denies eye pain, eye redness, ear pain, ringing in the ears, wax buildup, runny nose, nasal congestion, bloody  nose, or sore throat. Respiratory: Denies difficulty breathing, shortness of breath, cough or sputum production.   Cardiovascular: Denies chest pain, chest tightness, palpitations or swelling in the hands or feet.  Gastrointestinal: Pt reports reflux. Denies abdominal pain, bloating, constipation, diarrhea or blood in the stool.  GU: Denies urgency, frequency, pain with urination, burning sensation, blood in urine, odor or discharge. Musculoskeletal: Denies decrease in range of motion, difficulty with gait, muscle pain or joint pain and swelling.  Skin: Denies redness, rashes, lesions or ulcercations.  Neurological: Patient reports inattention.  Denies dizziness, difficulty with memory, difficulty with speech or problems with balance and coordination.  Psych: Patient has a history of anxiety and depression.  Denies SI/HI.  No other specific complaints in a complete review of systems (except as listed in HPI above).  Objective:   Physical Exam   BP 124/81 (BP Location: Right Arm, Patient Position: Sitting, Cuff Size: Normal)   Pulse 84   Temp (!) 96.9 F (36.1 C) (Temporal)   Wt 193 lb (87.5 kg)   LMP 07/12/2011   SpO2 99%   BMI 28.50 kg/m   Wt Readings from Last 3 Encounters:  08/08/21 190 lb (86.2 kg)  04/21/21 194 lb 12.8 oz (88.4 kg)  12/04/20 203 lb (92.1 kg)    General: Appears her stated age, overweight, in NAD. Skin: Warm, dry and intact.  HEENT: Head: normal shape and size; Eyes: PERRLA and EOMs intact;  Neck:  Neck supple, trachea midline. No masses, lumps or thyromegaly present.  Cardiovascular: Normal rate and rhythm. S1,S2 noted.  No murmur, rubs or gallops noted. No JVD or BLE edema. Pulmonary/Chest: Normal effort and positive vesicular breath sounds. No respiratory distress. No wheezes, rales or ronchi noted.  Abdomen: Soft and nontender. Normal bowel sounds. Musculoskeletal: Strength 5/5 BUE/BLE. No difficulty with gait.  Neurological: Alert and oriented.  Cranial nerves II-XII grossly intact. Coordination normal.  Psychiatric: Mood and affect normal. Behavior is normal. Judgment and thought content normal.    BMET    Component Value Date/Time   NA 137 04/21/2021 0920   NA 137 05/31/2013 0846   K 3.4 (L) 04/21/2021 0920   K 3.8 05/31/2013 0846   CL 99 04/21/2021 0920   CL 106 05/31/2013 0846   CO2 29 04/21/2021 0920   CO2 26 05/31/2013 0846   GLUCOSE 89 04/21/2021 0920   GLUCOSE 93 05/31/2013 0846   BUN 7 04/21/2021 0920   BUN 10 05/31/2013 0846   CREATININE 0.75 04/21/2021 0920   CALCIUM 9.2 04/21/2021 0920  CALCIUM 8.2 (L) 05/31/2013 0846   GFRNONAA >60 08/12/2020 1525   GFRNONAA >60 05/31/2013 0846   GFRAA >60 02/08/2020 0600   GFRAA >60 05/31/2013 0846    Lipid Panel     Component Value Date/Time   CHOL 240 (H) 04/21/2021 0920   TRIG 176 (H) 04/21/2021 0920   HDL 109 04/21/2021 0920   CHOLHDL 2.2 04/21/2021 0920   VLDL 35.2 05/17/2019 1606   LDLCALC 102 (H) 04/21/2021 0920    CBC    Component Value Date/Time   WBC 6.0 12/04/2020 1411   RBC 4.30 12/04/2020 1411   HGB 12.5 12/04/2020 1411   HGB 13.3 05/31/2013 0846   HCT 39.3 12/04/2020 1411   HCT 39.2 05/31/2013 0846   PLT 281 12/04/2020 1411   PLT 219 05/31/2013 0846   MCV 91.4 12/04/2020 1411   MCV 88 05/31/2013 0846   MCH 29.1 12/04/2020 1411   MCHC 31.8 (L) 12/04/2020 1411   RDW 12.5 12/04/2020 1411   RDW 12.8 05/31/2013 0846   LYMPHSABS 2.5 08/12/2020 1525   LYMPHSABS 2.1 05/31/2013 0846   MONOABS 0.5 08/12/2020 1525   MONOABS 0.4 05/31/2013 0846   EOSABS 0.0 08/12/2020 1525   EOSABS 0.1 05/31/2013 0846   BASOSABS 0.1 08/12/2020 1525   BASOSABS 0.1 05/31/2013 0846    Hgb A1C Lab Results  Component Value Date   HGBA1C 5.5 04/21/2021           Assessment & Plan:   Preventative Health Maintenance:  Encouraged her to get a flu shot in the fall Tetanus UTD Encouraged her to get her COVID booster She no longer needs Pap smears She  would like to defer mammogram until 47 We will obtain screening colonoscopy at 48 Encouraged her to consume a balanced diet and exercise regimen Advised her to see an eye doctor and dentist annually We will check CBC, c-Met, lipid, A1c today  RTC in 6 months, follow-up chronic conditions Webb Silversmith, NP

## 2021-12-17 NOTE — Patient Instructions (Signed)

## 2021-12-18 ENCOUNTER — Telehealth: Payer: Federal, State, Local not specified - PPO | Admitting: Physician Assistant

## 2021-12-18 DIAGNOSIS — M546 Pain in thoracic spine: Secondary | ICD-10-CM
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
## 2021-12-20 LAB — DRUG MONITORING, PANEL 8 WITH CONFIRMATION, URINE
6 Acetylmorphine: NEGATIVE ng/mL (ref <10)
Alcohol Metabolites: POSITIVE ng/mL — AB (ref <500)
Alphahydroxyalprazolam: 82 ng/mL — ABNORMAL HIGH (ref <25)
Alphahydroxymidazolam: NEGATIVE ng/mL (ref <50)
Alphahydroxytriazolam: NEGATIVE ng/mL (ref <50)
Aminoclonazepam: NEGATIVE ng/mL (ref <25)
Amphetamine: 3596 ng/mL — ABNORMAL HIGH (ref <250)
Amphetamines: POSITIVE ng/mL — AB (ref <500)
Benzodiazepines: POSITIVE ng/mL — AB (ref <100)
Buprenorphine, Urine: NEGATIVE ng/mL (ref <5)
Cocaine Metabolite: NEGATIVE ng/mL (ref <150)
Creatinine: 104.5 mg/dL (ref >=20.0)
Ethyl Glucuronide (ETG): 3222 ng/mL — ABNORMAL HIGH (ref <500)
Ethyl Sulfate (ETS): 2352 ng/mL — ABNORMAL HIGH (ref <100)
Hydroxyethylflurazepam: NEGATIVE ng/mL (ref <50)
Lorazepam: NEGATIVE ng/mL (ref <50)
MDMA: NEGATIVE ng/mL (ref <500)
Marijuana Metabolite: NEGATIVE ng/mL (ref <20)
Methamphetamine: NEGATIVE ng/mL (ref <250)
Nordiazepam: NEGATIVE ng/mL (ref <50)
Opiates: NEGATIVE ng/mL (ref <100)
Oxazepam: NEGATIVE ng/mL (ref <50)
Oxidant: NEGATIVE ug/mL (ref <200)
Oxycodone: NEGATIVE ng/mL (ref <100)
Temazepam: NEGATIVE ng/mL (ref <50)
pH: 6 (ref 4.5–9.0)

## 2021-12-20 LAB — COMPLETE METABOLIC PANEL WITH GFR
AG Ratio: 1.5 (calc) (ref 1.0–2.5)
ALT: 13 U/L (ref 6–29)
AST: 15 U/L (ref 10–30)
Albumin: 4.1 g/dL (ref 3.6–5.1)
Alkaline phosphatase (APISO): 79 U/L (ref 31–125)
BUN: 15 mg/dL (ref 7–25)
CO2: 25 mmol/L (ref 20–32)
Calcium: 9.1 mg/dL (ref 8.6–10.2)
Chloride: 106 mmol/L (ref 98–110)
Creat: 0.81 mg/dL (ref 0.50–0.99)
Globulin: 2.8 g/dL (calc) (ref 1.9–3.7)
Glucose, Bld: 98 mg/dL (ref 65–139)
Potassium: 4 mmol/L (ref 3.5–5.3)
Sodium: 139 mmol/L (ref 135–146)
Total Bilirubin: 0.4 mg/dL (ref 0.2–1.2)
Total Protein: 6.9 g/dL (ref 6.1–8.1)
eGFR: 92 mL/min/{1.73_m2} (ref >=60)

## 2021-12-20 LAB — CBC
HCT: 38.2 % (ref 35.0–45.0)
Hemoglobin: 12.6 g/dL (ref 11.7–15.5)
MCH: 30.7 pg (ref 27.0–33.0)
MCHC: 33 g/dL (ref 32.0–36.0)
MCV: 92.9 fL (ref 80.0–100.0)
MPV: 10.5 fL (ref 7.5–12.5)
Platelets: 275 10*3/uL (ref 140–400)
RBC: 4.11 10*6/uL (ref 3.80–5.10)
RDW: 12.6 % (ref 11.0–15.0)
WBC: 7.7 10*3/uL (ref 3.8–10.8)

## 2021-12-20 LAB — LIPID PANEL
Cholesterol: 216 mg/dL — ABNORMAL HIGH (ref <200)
HDL: 99 mg/dL (ref >=50)
LDL Cholesterol (Calc): 92 mg/dL (calc)
Non-HDL Cholesterol (Calc): 117 mg/dL (calc) (ref <130)
Total CHOL/HDL Ratio: 2.2 (calc) (ref <5.0)
Triglycerides: 145 mg/dL (ref <150)

## 2021-12-20 LAB — HEMOGLOBIN A1C
Hgb A1c MFr Bld: 5.5 % of total Hgb (ref <5.7)
Mean Plasma Glucose: 111 mg/dL
eAG (mmol/L): 6.2 mmol/L

## 2021-12-20 LAB — DM TEMPLATE

## 2022-01-02 ENCOUNTER — Telehealth: Payer: Federal, State, Local not specified - PPO | Admitting: Nurse Practitioner

## 2022-01-02 DIAGNOSIS — G43109 Migraine with aura, not intractable, without status migrainosus: Secondary | ICD-10-CM

## 2022-01-02 MED ORDER — SUMATRIPTAN SUCCINATE 100 MG PO TABS: 100.0000 mg | ORAL_TABLET | ORAL | 0 refills | Status: DC | PRN

## 2022-01-02 MED ORDER — ONDANSETRON 4 MG PO TBDP: 4.0000 mg | ORAL_TABLET | Freq: Three times a day (TID) | ORAL | 0 refills | Status: DC | PRN

## 2022-01-09 ENCOUNTER — Other Ambulatory Visit: Payer: Self-pay | Admitting: Internal Medicine

## 2022-01-11 MED ORDER — ALPRAZOLAM 0.5 MG PO TABS: 0.5000 mg | ORAL_TABLET | Freq: Every day | ORAL | 0 refills | Status: DC | PRN

## 2022-01-12 ENCOUNTER — Other Ambulatory Visit: Payer: Self-pay | Admitting: Internal Medicine

## 2022-01-13 ENCOUNTER — Other Ambulatory Visit: Payer: Self-pay | Admitting: Internal Medicine

## 2022-01-13 NOTE — Telephone Encounter (Signed)
Requested by interface surescripts. Medication discontinued on 12/17/21.  Requested Prescriptions  Refused Prescriptions Disp Refills  . pantoprazole (PROTONIX) 40 MG tablet [Pharmacy Med Name: PANTOPRAZOLE SOD DR 40 MG TAB] 90 tablet 0    Sig: TAKE 1 TABLET BY MOUTH EVERY DAY     Gastroenterology: Proton Pump Inhibitors Passed - 01/12/2022  1:57 AM      Passed - Valid encounter within last 12 months    Recent Outpatient Visits          3 weeks ago Mixed hyperlipidemia   [facility] [place], Coralie Keens, NP   8 months ago Left-sided chest pain   [facility] [place], Coralie Keens, NP   1 year ago Encounter for general adult medical examination with abnormal findings   [facility] [place], Coralie Keens, NP   1 year ago Primary hypertension   [place] [place], NP   6 years ago Sciatica of left side   [facility], MD

## 2022-01-14 MED ORDER — AMPHETAMINE-DEXTROAMPHET ER 10 MG PO CP24: 10.0000 mg | ORAL_CAPSULE | Freq: Two times a day (BID) | ORAL | 0 refills | Status: DC

## 2022-01-18 ENCOUNTER — Encounter: Payer: Self-pay | Admitting: Internal Medicine

## 2022-01-18 ENCOUNTER — Telehealth: Payer: Self-pay | Admitting: Internal Medicine

## 2022-01-18 NOTE — Telephone Encounter (Signed)
Pt is calling to report that the pharmacy is stating that she is need of PA for amphetamine-dextroamphetamine (ADDERALL XR) 10 MG 24 hr capsule [[id number]]  & esomeprazole (NEXIUM) 40 MG capsule [[id number]]    Cb- [phone]

## 2022-01-18 NOTE — Telephone Encounter (Signed)
PA sent in to Cover My Meds.

## 2022-01-20 MED ORDER — AMPHETAMINE-DEXTROAMPHET ER 20 MG PO CP24: 20.0000 mg | ORAL_CAPSULE | ORAL | 0 refills | Status: DC

## 2022-01-28 ENCOUNTER — Encounter: Payer: Self-pay | Admitting: Internal Medicine

## 2022-01-28 ENCOUNTER — Other Ambulatory Visit: Payer: Self-pay | Admitting: Internal Medicine

## 2022-02-09 ENCOUNTER — Other Ambulatory Visit: Payer: Self-pay | Admitting: Internal Medicine

## 2022-02-09 MED ORDER — ALPRAZOLAM 0.5 MG PO TABS: 0.5000 mg | ORAL_TABLET | Freq: Every day | ORAL | 0 refills | Status: DC | PRN

## 2022-02-19 ENCOUNTER — Telehealth: Payer: Federal, State, Local not specified - PPO | Admitting: Physician Assistant

## 2022-02-19 DIAGNOSIS — B379 Candidiasis, unspecified: Secondary | ICD-10-CM

## 2022-02-19 DIAGNOSIS — T3695XA Adverse effect of unspecified systemic antibiotic, initial encounter: Secondary | ICD-10-CM | POA: Diagnosis not present

## 2022-02-19 MED ORDER — FLUCONAZOLE 200 MG PO TABS: 200.0000 mg | ORAL_TABLET | Freq: Once | ORAL | 0 refills | Status: AC

## 2022-02-19 NOTE — Progress Notes (Signed)

## 2022-02-21 ENCOUNTER — Telehealth: Payer: Self-pay | Admitting: Urgent Care

## 2022-02-21 DIAGNOSIS — N760 Acute vaginitis: Secondary | ICD-10-CM

## 2022-02-21 DIAGNOSIS — B379 Candidiasis, unspecified: Secondary | ICD-10-CM

## 2022-02-21 MED ORDER — FLUCONAZOLE 150 MG PO TABS: 150.0000 mg | ORAL_TABLET | Freq: Once | ORAL | 0 refills | Status: AC

## 2022-02-21 NOTE — Progress Notes (Signed)

## 2022-02-24 ENCOUNTER — Other Ambulatory Visit: Payer: Self-pay | Admitting: Internal Medicine

## 2022-02-25 MED ORDER — AMPHETAMINE-DEXTROAMPHET ER 20 MG PO CP24: 20.0000 mg | ORAL_CAPSULE | ORAL | 0 refills | Status: DC

## 2022-03-02 ENCOUNTER — Encounter: Payer: Self-pay | Admitting: Internal Medicine

## 2022-03-02 DIAGNOSIS — G43109 Migraine with aura, not intractable, without status migrainosus: Secondary | ICD-10-CM

## 2022-03-03 MED ORDER — SUMATRIPTAN SUCCINATE 100 MG PO TABS: 100.0000 mg | ORAL_TABLET | ORAL | 0 refills | Status: DC | PRN

## 2022-03-10 ENCOUNTER — Other Ambulatory Visit: Payer: Self-pay | Admitting: Internal Medicine

## 2022-03-10 MED ORDER — ALPRAZOLAM 0.5 MG PO TABS: 0.5000 mg | ORAL_TABLET | Freq: Every day | ORAL | 0 refills | Status: DC | PRN

## 2022-03-16 ENCOUNTER — Ambulatory Visit: Payer: Self-pay

## 2022-03-16 NOTE — Patient Instructions (Signed)
Visit Information  Thank you for taking time to visit with me today. Please don't hesitate to contact me if I can be of assistance to you.   Following are the goals we discussed today:   Goals Addressed             This Visit's Progress    COMPLETED: RNCM: Sinus concerns       Care Coordination Interventions: Evaluation of current treatment plan related to sinus congestion and patient's adherence to plan as established by provider Advised patient to call the office if she has not heard from office scheduler about a telephone visit or visit to be evaluated for what the patient feels is a sinus infection. She states she has this a couple of times a year and she feels like she may be having one now.  Provided education to patient re: monitoring for Sx and Sx of infection, running a fever, or worsening condition to seek assistance. She sent a message to have an e-visit yesterday but did not hear back from them Reviewed medications with patient and discussed the patient has been taking mucinex and this has helped but it is "lingering" and has been going on for about 5 days Collaborated with the office staff at [facility] regarding the patient needing a follow up with pcp in relation to possible sinus infection.  Discussed plans with patient for ongoing care management follow up and provided patient with direct contact information for care management team Advised patient to discuss sx and sx of sinus infection, changes or questions related to her chronic conditions with provider Screening for signs and symptoms of depression related to chronic disease state  Assessed social determinant of health barriers Review of the program and how to reach the [facility] for new questions or concerns Provided education about follow up with the pcp, review of SDOH with the patient today.  Active listening / Reflection utilized  Emotional Support Provided           Please call the care guide team at [phone] if  you need to schedule an appointment.   If you are experiencing a Mental Health or [place] or need someone to talk to, please call the Suicide and Crisis Lifeline: 988 call the [place] National Suicide Prevention Lifeline: (308)313-7378 or TTY: 2348011092 TTY (667) 420-2741) to talk to a trained counselor call 1-800-273-TALK (toll free, 24 hour hotline)  Patient verbalizes understanding of instructions and care plan provided today and agrees to view in [place]. Active MyChart status and patient understanding of how to access instructions and care plan via MyChart confirmed with patient.     No further follow up required: the patient has RNCM contact information and knows to call for new questions or concerns.  Noreene Larsson RN, MSN, CCM Community Care Coordinator [place] [facility] Mobile: [phone]

## 2022-03-16 NOTE — Patient Outreach (Signed)
  Care Coordination   Initial Visit Note   03/16/2022 Name: Linda [name] MRN: [id number] DOB: 1976-06-19  Linda [name] is a 46 y.o. year old female who sees Ba[place], Coralie Keens, NP for primary care. I spoke with  Linda [name] by phone today.  What matters to the patients health and wellness today?  Possible sinus infection    Goals Addressed             This Visit's Progress    COMPLETED: RNCM: Sinus concerns       Care Coordination Interventions: Evaluation of current treatment plan related to sinus congestion and patient's adherence to plan as established by provider Advised patient to call the office if she has not heard from office scheduler about a telephone visit or visit to be evaluated for what the patient feels is a sinus infection. She states she has this a couple of times a year and she feels like she may be having one now.  Provided education to patient re: monitoring for Sx and Sx of infection, running a fever, or worsening condition to seek assistance. She sent a message to have an e-visit yesterday but did not hear back from them Reviewed medications with patient and discussed the patient has been taking mucinex and this has helped but it is "lingering" and has been going on for about 5 days Collaborated with the office staff at [facility] regarding the patient needing a follow up with pcp in relation to possible sinus infection.  Discussed plans with patient for ongoing care management follow up and provided patient with direct contact information for care management team Advised patient to discuss sx and sx of sinus infection, changes or questions related to her chronic conditions with provider Screening for signs and symptoms of depression related to chronic disease state  Assessed social determinant of health barriers Review of the program and how to reach the [facility] for new questions or concerns Provided education about follow up with the pcp, review of SDOH with the  patient today.  Active listening / Reflection utilized  Emotional Support Provided         SDOH assessments and interventions completed:  Yes  SDOH Interventions Today    Flowsheet Row Most Recent Value  SDOH Interventions   Food Insecurity Interventions Intervention Not Indicated  Housing Interventions Intervention Not Indicated  Transportation Interventions Intervention Not Indicated  Utilities Interventions Intervention Not Indicated  Stress Interventions Intervention Not Indicated  [has a stressful job, but not more than usual stress level]        Care Coordination Interventions Activated:  Yes  Care Coordination Interventions:  Yes, provided   Follow up plan: No further intervention required.   Encounter Outcome:  Pt. Visit Completed   Noreene Larsson RN, MSN, [place] [facility] Mobile: [phone]

## 2022-03-31 ENCOUNTER — Other Ambulatory Visit: Payer: Self-pay | Admitting: Internal Medicine

## 2022-03-31 ENCOUNTER — Telehealth: Payer: Federal, State, Local not specified - PPO | Admitting: Physician Assistant

## 2022-03-31 DIAGNOSIS — J069 Acute upper respiratory infection, unspecified: Secondary | ICD-10-CM

## 2022-03-31 MED ORDER — IPRATROPIUM BROMIDE 0.03 % NA SOLN: 2.0000 | Freq: Two times a day (BID) | NASAL | 0 refills | Status: DC

## 2022-03-31 MED ORDER — AMPHETAMINE-DEXTROAMPHET ER 20 MG PO CP24: 20.0000 mg | ORAL_CAPSULE | ORAL | 0 refills | Status: DC

## 2022-03-31 MED ORDER — BENZONATATE 100 MG PO CAPS: 100.0000 mg | ORAL_CAPSULE | Freq: Three times a day (TID) | ORAL | 0 refills | Status: DC | PRN

## 2022-03-31 MED ORDER — PSEUDOEPH-BROMPHEN-DM 30-2-10 MG/5ML PO SYRP: 5.0000 mL | ORAL_SOLUTION | Freq: Four times a day (QID) | ORAL | 0 refills | Status: DC | PRN

## 2022-03-31 NOTE — Progress Notes (Signed)
E-Visit for Upper Respiratory Infection   We are sorry you are not feeling well.  Here is how we plan to help!  Based on what you have shared with me, it looks like you may have a viral upper respiratory infection.  Upper respiratory infections are caused by a large number of viruses; however, rhinovirus is the most common cause.   Symptoms vary from person to person, with common symptoms including sore throat, cough, fatigue or lack of energy and feeling of general discomfort.  A low-grade fever of up to 100.4 may present, but is often uncommon.  Symptoms vary however, and are closely related to a person's age or underlying illnesses.  The most common symptoms associated with an upper respiratory infection are nasal discharge or congestion, cough, sneezing, headache and pressure in the ears and face.  These symptoms usually persist for about 3 to 10 days, but can last up to 2 weeks.  It is important to know that upper respiratory infections do not cause serious illness or complications in most cases.    Upper respiratory infections can be transmitted from person to person, with the most common method of transmission being a person's hands.  The virus is able to live on the skin and can infect other persons for up to 2 hours after direct contact.  Also, these can be transmitted when someone coughs or sneezes; thus, it is important to cover the mouth to reduce this risk.  To keep the spread of the illness at [place], good hand hygiene is very important.  This is an infection that is most likely caused by a virus. There are no specific treatments other than to help you with the symptoms until the infection runs its course.  We are sorry you are not feeling well.  Here is how we plan to help!   For nasal congestion, you may use an oral decongestants such as Mucinex D or if you have glaucoma or high blood pressure use plain Mucinex.  Saline nasal spray or nasal drops can help and can safely be used as often as  needed for congestion.  For your congestion, I have prescribed Ipratropium Bromide nasal spray 0.03% two sprays in each nostril 2-3 times a day  If you do not have a history of heart disease, hypertension, diabetes or thyroid disease, prostate/bladder issues or glaucoma, you may also use Sudafed to treat nasal congestion.  It is highly recommended that you consult with a pharmacist or your primary care physician to ensure this medication is safe for you to take.     If you have a cough, you may use cough suppressants such as Delsym and Robitussin.  If you have glaucoma or high blood pressure, you can also use Coricidin HBP.   For cough I have prescribed for you A prescription cough medication called Tessalon Perles 100 mg. You may take 1-2 capsules every 8 hours as needed for cough and Bromfed DM cough syrup. Take 23m every 6-8 hours as needed for cough. Can use in conjunction with Tessalon perles (Benzonatate). Can also take PLAIN Mucinex with the above 2 medications as well for cough.   If you have a sore or scratchy throat, use a saltwater gargle-  to  teaspoon of salt dissolved in a 4-ounce to 8-ounce glass of warm water.  Gargle the solution for approximately 15-30 seconds and then spit.  It is important not to swallow the solution.  You can also use throat lozenges/cough drops and Chloraseptic spray  to help with throat pain or discomfort.  Warm or cold liquids can also be helpful in relieving throat pain.  For headache, pain or general discomfort, you can use Ibuprofen or Tylenol as directed.   Some authorities believe that zinc sprays or the use of Echinacea may shorten the course of your symptoms.   HOME CARE Only take medications as instructed by your medical team. Be sure to drink plenty of fluids. Water is fine as well as fruit juices, sodas and electrolyte beverages. You may want to stay away from caffeine or alcohol. If you are nauseated, try taking small sips of liquids. How do you  know if you are getting enough fluid? Your urine should be a pale yellow or almost colorless. Get rest. Taking a steamy shower or using a humidifier may help nasal congestion and ease sore throat pain. You can place a towel over your head and breathe in the steam from hot water coming from a faucet. Using a saline nasal spray works much the same way. Cough drops, hard candies and sore throat lozenges may ease your cough. Avoid close contacts especially the very young and the elderly Cover your mouth if you cough or sneeze Always remember to wash your hands.   GET HELP RIGHT AWAY IF: You develop worsening fever. If your symptoms do not improve within 10 days You develop yellow or green discharge from your nose over 3 days. You have coughing fits You develop a severe head ache or visual changes. You develop shortness of breath, difficulty breathing or start having chest pain Your symptoms persist after you have completed your treatment plan  MAKE SURE YOU  Understand these instructions. Will watch your condition. Will get help right away if you are not doing well or get worse.  Thank you for choosing an e-visit.  Your e-visit answers were reviewed by a board certified advanced clinical practitioner to complete your personal care plan. Depending upon the condition, your plan could have included both over the counter or prescription medications.  Please review your pharmacy choice. Make sure the pharmacy is open so you can pick up prescription now. If there is a problem, you may contact your provider through [employer] and have the prescription routed to another pharmacy.  Your safety is important to [place]. If you have drug allergies check your prescription carefully.   For the next 24 hours you can use MyChart to ask questions about today's visit, request a non-urgent call back, or ask for a work or school excuse. You will get an email in the next two days asking about your experience.  I hope that your e-visit has been valuable and will speed your recovery.    I provided 5 minutes of non face-to-face time during this encounter for chart review and documentation.

## 2022-04-01 ENCOUNTER — Other Ambulatory Visit: Payer: Self-pay | Admitting: Internal Medicine

## 2022-04-01 DIAGNOSIS — G43109 Migraine with aura, not intractable, without status migrainosus: Secondary | ICD-10-CM

## 2022-04-01 NOTE — Telephone Encounter (Signed)
Requested Prescriptions  Pending Prescriptions Disp Refills  . SUMAtriptan (IMITREX) 100 MG tablet [Pharmacy Med Name: SUMATRIPTAN SUCC 100 MG TABLET] 10 tablet 0    Sig: 1 TAB BY MOUTH EVERY 2 HRS AS NEEDED FOR MIGRAINE.MAY REPEAT IN 2 HOURS IF HEADACHE PERSISTSRECURS.     Neurology:  Migraine Therapy - Triptan Passed - 04/01/2022  2:16 AM      Passed - Last BP in normal range    BP Readings from Last 1 Encounters:  12/17/21 124/81         Passed - Valid encounter within last 12 months    Recent Outpatient Visits          3 months ago Mixed hyperlipidemia   [place], Coralie Keens, NP   11 months ago Left-sided chest pain   [facility] [place], Coralie Keens, NP   1 year ago Encounter for general adult medical examination with abnormal findings   [facility] [place], Coralie Keens, NP   1 year ago Primary hypertension   [place], Coralie Keens, NP   6 years ago Sciatica of left side   [facility] at [facility], MD

## 2022-04-08 ENCOUNTER — Other Ambulatory Visit: Payer: Self-pay | Admitting: Internal Medicine

## 2022-04-09 MED ORDER — ALPRAZOLAM 0.5 MG PO TABS: 0.5000 mg | ORAL_TABLET | Freq: Every day | ORAL | 0 refills | Status: DC | PRN

## 2022-04-16 ENCOUNTER — Encounter: Payer: Self-pay | Admitting: Internal Medicine

## 2022-04-16 MED ORDER — AMPHETAMINE-DEXTROAMPHET ER 20 MG PO CP24: 20.0000 mg | ORAL_CAPSULE | ORAL | 0 refills | Status: DC

## 2022-04-20 ENCOUNTER — Telehealth: Payer: Federal, State, Local not specified - PPO | Admitting: Nurse Practitioner

## 2022-04-20 DIAGNOSIS — L239 Allergic contact dermatitis, unspecified cause: Secondary | ICD-10-CM | POA: Diagnosis not present

## 2022-04-20 MED ORDER — PREDNISONE 10 MG PO TABS: ORAL_TABLET | ORAL | 0 refills | Status: DC

## 2022-04-20 NOTE — Progress Notes (Signed)
E Visit for Rash  We are sorry that you are not feeling well. Here is how we plan to help!  Based on what you shared with me it looks like you have contact dermatitis.  Contact dermatitis is a skin rash caused by something that touches the skin and causes irritation or inflammation.  Your skin may be red, swollen, dry, cracked, and itch.  The rash should go away in a few days but can last a few weeks.  If you get a rash, it's important to figure out what caused it so the irritant can be avoided in the future. and I am prescribing a two week course of steroids (37 tablets of 10 mg prednisone).  Days 1-4 take 4 tablets (40 mg) daily  Days 5-8 take 3 tablets (30 mg) daily, Days 9-11 take 2 tablets (20 mg) daily, Days 12-14 take 1 tablet (10 mg) daily.    You can also use an antihistamine daily like Claritin during the day ('10mg'$ ) and  Benadryl 25 mg - 50 mg before bed.   Since we are prescribing an oral steroid you can stop the topical steroid (hydrocortisone)      HOME CARE:  Take cool showers and avoid direct sunlight. Apply cool compress or wet dressings. Take a bath in an oatmeal bath.  Sprinkle content of one Aveeno packet under running faucet with comfortably warm water.  Bathe for 15-20 minutes, 1-2 times daily.  Pat dry with a towel. Do not rub the rash. Use hydrocortisone cream. Take an antihistamine like Benadryl for widespread rashes that itch.  The adult dose of Benadryl is 25-50 mg by mouth 4 times daily. Caution:  This type of medication may cause sleepiness.  Do not drink alcohol, drive, or operate dangerous machinery while taking antihistamines.  Do not take these medications if you have prostate enlargement.  Read package instructions thoroughly on all medications that you take.  GET HELP RIGHT AWAY IF:  Symptoms don't go away after treatment. Severe itching that persists. If you rash spreads or swells. If you rash begins to smell. If it blisters and opens or develops a  yellow-brown crust. You develop a fever. You have a sore throat. You become short of breath.  MAKE SURE YOU:  Understand these instructions. Will watch your condition. Will get help right away if you are not doing well or get worse.  Thank you for choosing an e-visit.  Your e-visit answers were reviewed by a board certified advanced clinical practitioner to complete your personal care plan. Depending upon the condition, your plan could have included both over the counter or prescription medications.  Please review your pharmacy choice. Make sure the pharmacy is open so you can pick up prescription now. If there is a problem, you may contact your provider through [employer] and have the prescription routed to another pharmacy.  Your safety is important to [place]. If you have drug allergies check your prescription carefully.   For the next 24 hours you can use MyChart to ask questions about today's visit, request a non-urgent call back, or ask for a work or school excuse. You will get an email in the next two days asking about your experience. I hope that your e-visit has been valuable and will speed your recovery.   Meds ordered this encounter  Medications   predniSONE (DELTASONE) 10 MG tablet    Sig: Take 4 tablets ('40mg'$ ) on days 1-4, then 3 tablets ('30mg'$ ) on days 5-8, then 2 tablets (  $'20mg'W$ ) on days 9-11, then 1 tablet daily for days 12-14. Take with food.    Dispense:  37 tablet    Refill:  0     I spent approximately 5 minutes reviewing the patient's history, current symptoms and coordinating their plan of care today.

## 2022-05-05 ENCOUNTER — Other Ambulatory Visit: Payer: Self-pay | Admitting: Internal Medicine

## 2022-05-06 MED ORDER — ALPRAZOLAM 0.5 MG PO TABS: 0.5000 mg | ORAL_TABLET | Freq: Every day | ORAL | 0 refills | Status: DC | PRN

## 2022-05-06 NOTE — Telephone Encounter (Signed)
Requested medications are due for refill today.  no  Requested medications are on the active medications list.  yes  Last refill. 05/06/2022 #20 0 rf  Future visit scheduled.   no  Notes to clinic.  Medication refill /refusal not delegated.    Requested Prescriptions  Pending Prescriptions Disp Refills   ALPRAZolam (XANAX) 0.5 MG tablet [Pharmacy Med Name: ALPRAZOLAM 0.5 MG TABLET] 20 tablet 0    Sig: TAKE 1 TABLET (0.5 MG TOTAL) BY MOUTH DAILY AS NEEDED. PLEASE SCHEDULE AN OFFICE VISIT     Not Delegated - Psychiatry: Anxiolytics/Hypnotics 2 Failed - 05/05/2022  3:02 PM      Failed - This refill cannot be delegated      Failed - Urine Drug Screen completed in last 360 days      Passed - Patient is not pregnant      Passed - Valid encounter within last 6 months    Recent Outpatient Visits           4 months ago Encounter for general adult medical examination with abnormal findings   [facility] [place], Coralie Keens, NP   1 year ago Left-sided chest pain   [facility] [address], Coralie Keens, NP   1 year ago Encounter for general adult medical examination with abnormal findings   [facility] [place], Coralie Keens, NP   1 year ago Primary hypertension   [place], NP   6 years ago Sciatica of left side   [facility] at [facility], MD

## 2022-05-15 ENCOUNTER — Other Ambulatory Visit: Payer: Self-pay | Admitting: Internal Medicine

## 2022-05-15 DIAGNOSIS — G43109 Migraine with aura, not intractable, without status migrainosus: Secondary | ICD-10-CM

## 2022-05-17 NOTE — Telephone Encounter (Signed)
Requested Prescriptions  Pending Prescriptions Disp Refills   SUMAtriptan (IMITREX) 100 MG tablet [Pharmacy Med Name: SUMATRIPTAN SUCC 100 MG TABLET] 10 tablet 0    Sig: 1 TAB BY MOUTH EVERY 2 HRS AS NEEDED FOR MIGRAINE.MAY REPEAT IN 2 HOURS IF HEADACHE PERSISTSRECURS.     Neurology:  Migraine Therapy - Triptan Passed - 05/15/2022  8:30 AM      Passed - Last BP in normal range    BP Readings from Last 1 Encounters:  12/17/21 124/81         Passed - Valid encounter within last 12 months    Recent Outpatient Visits           5 months ago Encounter for general adult medical examination with abnormal findings   [facility] [place], Coralie Keens, NP   1 year ago Left-sided chest pain   [facility] [place], Coralie Keens, NP   1 year ago Encounter for general adult medical examination with abnormal findings   [facility] [place], Coralie Keens, NP   1 year ago Primary hypertension   [place], NP   6 years ago Sciatica of left side   [facility] at [facility], MD

## 2022-06-04 ENCOUNTER — Other Ambulatory Visit: Payer: Self-pay | Admitting: Internal Medicine

## 2022-06-07 MED ORDER — ALPRAZOLAM 0.5 MG PO TABS: 0.5000 mg | ORAL_TABLET | Freq: Every day | ORAL | 0 refills | Status: DC | PRN

## 2022-06-14 ENCOUNTER — Other Ambulatory Visit: Payer: Self-pay | Admitting: Internal Medicine

## 2022-06-15 MED ORDER — AMPHETAMINE-DEXTROAMPHET ER 20 MG PO CP24: 20.0000 mg | ORAL_CAPSULE | ORAL | 0 refills | Status: DC

## 2022-06-28 ENCOUNTER — Other Ambulatory Visit: Payer: Self-pay | Admitting: Internal Medicine

## 2022-06-28 ENCOUNTER — Other Ambulatory Visit: Payer: Self-pay

## 2022-06-28 ENCOUNTER — Emergency Department
Admission: EM | Admit: 2022-06-28 | Discharge: 2022-06-28 | Discharge status: Against Medical Advice | Payer: Federal, State, Local not specified - PPO | Attending: Emergency Medicine | Admitting: Emergency Medicine

## 2022-06-28 ENCOUNTER — Encounter (HOSPITAL_BASED_OUTPATIENT_CLINIC_OR_DEPARTMENT_OTHER): Payer: Self-pay

## 2022-06-28 ENCOUNTER — Emergency Department (HOSPITAL_BASED_OUTPATIENT_CLINIC_OR_DEPARTMENT_OTHER)
Admission: EM | Admit: 2022-06-28 | Discharge: 2022-06-28 | Disposition: A | Discharge status: Home / Self Care | Payer: Federal, State, Local not specified - PPO | Attending: Emergency Medicine | Admitting: Emergency Medicine

## 2022-06-28 DIAGNOSIS — Z5321 Procedure and treatment not carried out due to patient leaving prior to being seen by health care provider: Secondary | ICD-10-CM | POA: Insufficient documentation

## 2022-06-28 DIAGNOSIS — G43409 Hemiplegic migraine, not intractable, without status migrainosus: Secondary | ICD-10-CM | POA: Diagnosis not present

## 2022-06-28 DIAGNOSIS — Z1152 Encounter for screening for COVID-19: Secondary | ICD-10-CM | POA: Diagnosis not present

## 2022-06-28 DIAGNOSIS — G43909 Migraine, unspecified, not intractable, without status migrainosus: Secondary | ICD-10-CM | POA: Diagnosis not present

## 2022-06-28 DIAGNOSIS — G43109 Migraine with aura, not intractable, without status migrainosus: Secondary | ICD-10-CM

## 2022-06-28 DIAGNOSIS — R519 Headache, unspecified: Secondary | ICD-10-CM | POA: Insufficient documentation

## 2022-06-28 DIAGNOSIS — J45909 Unspecified asthma, uncomplicated: Secondary | ICD-10-CM | POA: Insufficient documentation

## 2022-06-28 DIAGNOSIS — Z79899 Other long term (current) drug therapy: Secondary | ICD-10-CM | POA: Insufficient documentation

## 2022-06-28 DIAGNOSIS — I10 Essential (primary) hypertension: Secondary | ICD-10-CM | POA: Insufficient documentation

## 2022-06-28 LAB — RESP PANEL BY RT-PCR (RSV, FLU A&B, COVID)  RVPGX2
Influenza A by PCR: NEGATIVE
Influenza B by PCR: NEGATIVE
Resp Syncytial Virus by PCR: NEGATIVE
SARS Coronavirus 2 by RT PCR: NEGATIVE

## 2022-06-28 MED ORDER — SUMATRIPTAN SUCCINATE 6 MG/0.5ML ~~LOC~~ SOLN
6.0000 mg | Freq: Once | SUBCUTANEOUS | Status: AC
  Administered 2022-06-28: 6 mg via SUBCUTANEOUS
  Filled 2022-06-28: qty 0.5

## 2022-06-28 MED ORDER — DIPHENHYDRAMINE HCL 50 MG/ML IJ SOLN
25.0000 mg | Freq: Once | INTRAMUSCULAR | Status: AC
  Administered 2022-06-28: 25 mg via INTRAVENOUS
  Filled 2022-06-28: qty 1

## 2022-06-28 MED ORDER — KETOROLAC TROMETHAMINE 15 MG/ML IJ SOLN
15.0000 mg | Freq: Once | INTRAMUSCULAR | Status: AC
  Administered 2022-06-28: 15 mg via INTRAVENOUS
  Filled 2022-06-28: qty 1

## 2022-06-28 MED ORDER — METOCLOPRAMIDE HCL 5 MG/ML IJ SOLN
10.0000 mg | Freq: Once | INTRAMUSCULAR | Status: AC
  Administered 2022-06-28: 10 mg via INTRAVENOUS
  Filled 2022-06-28: qty 2

## 2022-06-28 MED ORDER — SODIUM CHLORIDE 0.9 % IV BOLUS
1000.0000 mL | Freq: Once | INTRAVENOUS | Status: AC
  Administered 2022-06-28: 1000 mL via INTRAVENOUS

## 2022-06-28 NOTE — ED Triage Notes (Signed)
Pt to ED from home. Pt has had a headache all day. She took her prescribed migraine imitrex and zofran at home with no relief. Pt is CAOx4 and in no acute distress at this time. Pt is ambulatory to triage room.

## 2022-06-28 NOTE — ED Triage Notes (Signed)
Pt with HA all day yesterday; pt has hx of migraines. Took imitrex and zofran at 6pm with a little relief. Ran out of medication. Pt feels the headache on RT side and radiates down to RT neck. C/o nausea as well, no vomiting.

## 2022-06-28 NOTE — ED Notes (Signed)
Patient ambulatory with steady gait. States she is going to go home. Speaking in full clear sentences. AOX4.

## 2022-06-28 NOTE — ED Provider Notes (Signed)
[facility] Provider Note: [name], MD, FACEP  CSN: [id number] MRN: [id number] ARRIVAL: 06/28/22 at [place]: [id number]/[id number]   CHIEF COMPLAINT  Headache   HISTORY OF PRESENT ILLNESS  06/28/22 3:49 AM Linda [name] is a 46 y.o. female with a history of migraines.  She is here with a headache that began yesterday afternoon about 1 PM.  It is located on the right side of her head with radiation to the right side of her face.  It is consistent with previous migraines.  She rates her pain as a 6 out of 10, pounding in nature.  She took Imitrex and Zofran about 6 PM with little relief.  She did not take a second dose of Imitrex as she was out of it.  She is having nausea but no vomiting.  She is having photophobia.   Past Medical History:  Diagnosis Date   Alcohol abuse    Anxiety    Asthma    GERD (gastroesophageal reflux disease)    Heart murmur    while pregnant   Hypertension    Migraine     Past Surgical History:  Procedure Laterality Date   ABDOMINAL HYSTERECTOMY  12/20012   Total   ENDOMETRIAL ABLATION  2012   GASTRIC ROUX-EN-Y N/A 05/18/2016   Procedure: LAPAROSCOPIC ROUX-EN-Y GASTRIC WITH UPPER ENDO;  Surgeon: Arta Bruce Kinsinger, MD;  Location: [facility];  Service: General;  Laterality: N/A;   MYRINGOTOMY WITH TUBE PLACEMENT     X 3 SINCE ORRIGINALLY PLACE IN 2010   TONSILECTOMY, ADENOIDECTOMY, BILATERAL MYRINGOTOMY AND TUBES  2010   TONSILLECTOMY     TUBAL LIGATION  08/2010   UPPER GI ENDOSCOPY  05/18/2016   Procedure: UPPER GI ENDOSCOPY;  Surgeon: Arta Bruce Kinsinger, MD;  Location: [facility];  Service: General;;    Family History  Problem Relation Age of Onset   Hypertension Mother    Uterine cancer Mother    Stroke Mother    Heart attack Mother    Hypertension Father    Diabetes Father    Multiple sclerosis Father    Cancer Maternal Grandmother        PANCREATIC   Hypertension Maternal Grandmother    Diabetes Maternal Grandmother     Cancer Maternal Grandfather        LUNG   Hypertension Maternal Grandfather    Diabetes Maternal Grandfather    Cancer Paternal Grandmother        THROAT   Alzheimer's disease Paternal Grandmother    Diabetes Maternal Aunt    Diabetes Maternal Uncle     Social History   Tobacco Use   Smoking status: Never   Smokeless tobacco: Never  Vaping Use   Vaping Use: Never used  Substance Use Topics   Alcohol use: Yes    Comment: occ   Drug use: No    Prior to Admission medications   Medication Sig Start Date End Date Taking? Authorizing Provider  albuterol (VENTOLIN HFA) 108 (90 Base) MCG/ACT inhaler Inhale 2 puffs into the lungs every 6 (six) hours as needed for wheezing or shortness of breath. 12/17/21   Jearld Fenton, NP  ALPRAZolam Duanne Moron) 0.5 MG tablet Take 1 tablet (0.5 mg total) by mouth daily as needed. Please schedule an office visit before anymore refills. 06/07/22   Jearld Fenton, NP  amLODipine (NORVASC) 5 MG tablet TAKE 1 TABLET BY MOUTH EVERY DAY 06/14/21   Jearld Fenton, NP  amphetamine-dextroamphetamine (ADDERALL XR) 20  MG 24 hr capsule Take 1 capsule (20 mg total) by mouth every morning. 06/15/22   Jearld Fenton, NP  benzonatate (TESSALON) 100 MG capsule Take 1 capsule (100 mg total) by mouth 3 (three) times daily as needed. 03/31/22   Mar Daring, PA-C  brompheniramine-pseudoephedrine-DM 30-2-10 MG/5ML syrup Take 5 mLs by mouth 4 (four) times daily as needed. 03/31/22   Mar Daring, PA-C  esomeprazole (NEXIUM) 40 MG capsule Take 1 capsule (40 mg total) by mouth daily. 12/17/21   Jearld Fenton, NP  ipratropium (ATROVENT) 0.03 % nasal spray Place 2 sprays into both nostrils every 12 (twelve) hours. 03/31/22   Mar Daring, PA-C  ondansetron (ZOFRAN-ODT) 4 MG disintegrating tablet Take 1 tablet (4 mg total) by mouth every 8 (eight) hours as needed for nausea or vomiting. 01/02/22   Gildardo Pounds, NP  predniSONE (DELTASONE) 10 MG tablet Take 4  tablets ('40mg'$ ) on days 1-4, then 3 tablets ('30mg'$ ) on days 5-8, then 2 tablets ('20mg'$ ) on days 9-11, then 1 tablet daily for days 12-14. Take with food. 04/20/22   Apolonio Schneiders, FNP  SUMAtriptan (IMITREX) 100 MG tablet 1 TAB BY MOUTH EVERY 2 HRS AS NEEDED FOR MIGRAINE.MAY REPEAT IN 2 HOURS IF HEADACHE PERSISTSRECURS. 05/17/22   Jearld Fenton, NP    Allergies Nsaids   REVIEW OF SYSTEMS  Negative except as noted here or in the History of Present Illness.   PHYSICAL EXAMINATION  Initial Vital Signs Blood pressure (!) 163/128, pulse 90, temperature 97.9 F (36.6 C), temperature source Oral, resp. rate 18, last menstrual period 07/12/2011, SpO2 100 %.  Examination General: Well-developed, well-nourished female in no acute distress; appearance consistent with age of record HENT: normocephalic; atraumatic Eyes: pupils equal, round and reactive to light; extraocular muscles grossly intact; photophobia Neck: supple Heart: regular rate and rhythm Lungs: clear to auscultation bilaterally Abdomen: soft; nondistended; nontender; bowel sounds present Extremities: No deformity; full range of motion Neurologic: Awake, alert and oriented; motor function intact in all extremities and symmetric; no facial droop Skin: Warm and dry Psychiatric: Normal mood and affect   RESULTS  Summary of this visit's results, reviewed and interpreted by myself:   EKG Interpretation  Date/Time:    Ventricular Rate:    PR Interval:    QRS Duration:   QT Interval:    QTC Calculation:   R Axis:     Text Interpretation:         Laboratory Studies: No results found for this or any previous visit (from the past 24 hour(s)). Imaging Studies: No results found.  ED COURSE and MDM  Nursing notes, initial and subsequent vitals signs, including pulse oximetry, reviewed and interpreted by myself.  Vitals:   06/28/22 0348 06/28/22 0349 06/28/22 0415 06/28/22 0500  BP: (!) 163/128  (!) 151/86 130/78  Pulse: 90   67 75  Resp: '18  18 18  '$ Temp: 97.9 F (36.6 C)     TempSrc: Oral     SpO2: 100%  99% 98%  Weight:  86.2 kg    Height:  '5\' 9"'$  (1.753 m)     Medications  SUMAtriptan (IMITREX) injection 6 mg (6 mg Subcutaneous Given 06/28/22 0355)  sodium chloride 0.9 % bolus 1,000 mL (0 mLs Intravenous Stopped 06/28/22 0517)  diphenhydrAMINE (BENADRYL) injection 25 mg (25 mg Intravenous Given 06/28/22 0419)  metoCLOPramide (REGLAN) injection 10 mg (10 mg Intravenous Given 06/28/22 0419)  ketorolac (TORADOL) 15 MG/ML injection 15 mg (15 mg Intravenous  Given 06/28/22 0419)   4:11 AM No change with subcutaneous sumatriptan.  Will try IV fluids and medications.  5:20 AM Headache resolved after IV medications and fluids.  Presentation and ED course consistent with migraine.   PROCEDURES  Procedures   ED DIAGNOSES     ICD-10-CM   1. Sporadic migraine  G43.Harlan, MD 06/28/22 [phone]

## 2022-06-29 NOTE — Telephone Encounter (Signed)
Requested Prescriptions  Pending Prescriptions Disp Refills   SUMAtriptan (IMITREX) 100 MG tablet [Pharmacy Med Name: SUMATRIPTAN SUCC 100 MG TABLET] 10 tablet 0    Sig: 1 TAB BY MOUTH EVERY 2 HRS AS NEEDED FOR MIGRAINE.MAY REPEAT IN 2 HOURS IF HEADACHE PERSISTSRECURS.     Neurology:  Migraine Therapy - Triptan Failed - 06/28/2022  2:05 AM      Failed - Last BP in normal range    BP Readings from Last 1 Encounters:  06/28/22 130/78         Passed - Valid encounter within last 12 months    Recent Outpatient Visits           6 months ago Encounter for general adult medical examination with abnormal findings   [facility] [place], Coralie Keens, NP   1 year ago Left-sided chest pain   [facility] [place], Coralie Keens, NP   1 year ago Encounter for general adult medical examination with abnormal findings   [facility] [place], Coralie Keens, NP   1 year ago Primary hypertension   [place], NP   6 years ago Sciatica of left side   [facility], MD

## 2022-07-01 ENCOUNTER — Other Ambulatory Visit: Payer: Self-pay | Admitting: Internal Medicine

## 2022-07-07 ENCOUNTER — Other Ambulatory Visit: Payer: Self-pay | Admitting: Internal Medicine

## 2022-07-07 MED ORDER — ALPRAZOLAM 0.5 MG PO TABS: 0.5000 mg | ORAL_TABLET | Freq: Every day | ORAL | 0 refills | Status: DC | PRN

## 2022-07-15 ENCOUNTER — Other Ambulatory Visit: Payer: Self-pay | Admitting: Internal Medicine

## 2022-07-15 MED ORDER — AMPHETAMINE-DEXTROAMPHET ER 20 MG PO CP24: 20.0000 mg | ORAL_CAPSULE | ORAL | 0 refills | Status: DC

## 2022-07-16 ENCOUNTER — Telehealth: Payer: Federal, State, Local not specified - PPO | Admitting: Family Medicine

## 2022-07-16 DIAGNOSIS — J069 Acute upper respiratory infection, unspecified: Secondary | ICD-10-CM | POA: Diagnosis not present

## 2022-07-16 MED ORDER — PSEUDOEPH-BROMPHEN-DM 30-2-10 MG/5ML PO SYRP: 5.0000 mL | ORAL_SOLUTION | Freq: Four times a day (QID) | ORAL | 0 refills | Status: DC | PRN

## 2022-07-16 MED ORDER — FLUTICASONE PROPIONATE 50 MCG/ACT NA SUSP: 2.0000 | Freq: Every day | NASAL | 0 refills | Status: DC

## 2022-07-16 MED ORDER — BENZONATATE 100 MG PO CAPS: 100.0000 mg | ORAL_CAPSULE | Freq: Two times a day (BID) | ORAL | 0 refills | Status: DC | PRN

## 2022-07-16 NOTE — Progress Notes (Signed)

## 2022-08-04 ENCOUNTER — Other Ambulatory Visit: Payer: Self-pay | Admitting: Internal Medicine

## 2022-08-05 ENCOUNTER — Encounter: Payer: Self-pay | Admitting: Internal Medicine

## 2022-08-11 ENCOUNTER — Other Ambulatory Visit: Payer: Self-pay | Admitting: Internal Medicine

## 2022-08-11 ENCOUNTER — Encounter: Payer: Self-pay | Admitting: Internal Medicine

## 2022-08-11 ENCOUNTER — Ambulatory Visit (INDEPENDENT_AMBULATORY_CARE_PROVIDER_SITE_OTHER): Payer: Federal, State, Local not specified - PPO | Admitting: Internal Medicine

## 2022-08-11 VITALS — BP 124/66 | HR 70 | Temp 96.4°F | Wt 190.0 lb

## 2022-08-11 DIAGNOSIS — K219 Gastro-esophageal reflux disease without esophagitis: Secondary | ICD-10-CM | POA: Diagnosis not present

## 2022-08-11 DIAGNOSIS — G43109 Migraine with aura, not intractable, without status migrainosus: Secondary | ICD-10-CM | POA: Diagnosis not present

## 2022-08-11 DIAGNOSIS — F900 Attention-deficit hyperactivity disorder, predominantly inattentive type: Secondary | ICD-10-CM

## 2022-08-11 DIAGNOSIS — E663 Overweight: Secondary | ICD-10-CM

## 2022-08-11 DIAGNOSIS — F419 Anxiety disorder, unspecified: Secondary | ICD-10-CM | POA: Diagnosis not present

## 2022-08-11 DIAGNOSIS — R7309 Other abnormal glucose: Secondary | ICD-10-CM

## 2022-08-11 DIAGNOSIS — F32A Depression, unspecified: Secondary | ICD-10-CM

## 2022-08-11 DIAGNOSIS — G44209 Tension-type headache, unspecified, not intractable: Secondary | ICD-10-CM

## 2022-08-11 DIAGNOSIS — H9202 Otalgia, left ear: Secondary | ICD-10-CM | POA: Diagnosis not present

## 2022-08-11 DIAGNOSIS — E782 Mixed hyperlipidemia: Secondary | ICD-10-CM | POA: Diagnosis not present

## 2022-08-11 DIAGNOSIS — I10 Essential (primary) hypertension: Secondary | ICD-10-CM

## 2022-08-11 DIAGNOSIS — Z6828 Body mass index (BMI) 28.0-28.9, adult: Secondary | ICD-10-CM

## 2022-08-11 DIAGNOSIS — T85618A Breakdown (mechanical) of other specified internal prosthetic devices, implants and grafts, initial encounter: Secondary | ICD-10-CM

## 2022-08-11 MED ORDER — ESOMEPRAZOLE MAGNESIUM 40 MG PO CPDR: 40.0000 mg | DELAYED_RELEASE_CAPSULE | Freq: Every day | ORAL | 1 refills | Status: DC

## 2022-08-11 MED ORDER — ALPRAZOLAM 0.5 MG PO TABS: 0.5000 mg | ORAL_TABLET | Freq: Every day | ORAL | 0 refills | Status: DC | PRN

## 2022-08-11 MED ORDER — AMLODIPINE BESYLATE 5 MG PO TABS: 5.0000 mg | ORAL_TABLET | Freq: Every day | ORAL | 1 refills | Status: DC

## 2022-08-11 MED ORDER — KETOROLAC TROMETHAMINE 30 MG/ML IJ SOLN
30.0000 mg | Freq: Once | INTRAMUSCULAR | Status: AC
  Administered 2022-08-11: 30 mg via INTRAMUSCULAR

## 2022-08-11 NOTE — Telephone Encounter (Signed)
Requested medication (s) are due for refill today:   Rollene Fare ordered it today during OV  Requested medication (s) are on the active medication list:   Yes  Future visit scheduled:   No   Seen today by Rollene Fare   Last ordered: Today #59, 0 refills  Duplicate request received for non delegated refill reason returned.   Requested Prescriptions  Pending Prescriptions Disp Refills   ALPRAZolam (XANAX) 0.5 MG tablet [Pharmacy Med Name: ALPRAZOLAM 0.5 MG TABLET] 20 tablet 0    Sig: TAKE 1 TABLET BY MOUTH DAILY AS NEEDED. PLEASE SCHEDULE AN OFFICE VISIT BEFORE ANYMORE REFILLS.     Not Delegated - Psychiatry: Anxiolytics/Hypnotics 2 Failed - 08/11/2022  9:19 AM      Failed - This refill cannot be delegated      Failed - Urine Drug Screen completed in last 360 days      Passed - Patient is not pregnant      Passed - Valid encounter within last 6 months    Recent Outpatient Visits           Today Attention deficit hyperactivity disorder (ADHD), predominantly inattentive type   [facility] [place], Coralie Keens, NP   7 months ago Encounter for general adult medical examination with abnormal findings   [facility] [place], Coralie Keens, NP   1 year ago Left-sided chest pain   [facility], Coralie Keens, NP   1 year ago Encounter for general adult medical examination with abnormal findings   [facility] [place], Coralie Keens, NP   1 year ago Primary hypertension   [facility] [place], Coralie Keens, [place]

## 2022-08-11 NOTE — Assessment & Plan Note (Signed)
Continue Adderall 

## 2022-08-11 NOTE — Assessment & Plan Note (Signed)
C-Met and lipid profile today Encouraged her to consume low-fat diet 

## 2022-08-11 NOTE — Progress Notes (Signed)
Subjective:    Patient ID: Linda [name], female    DOB: 05-14-1976, 47 y.o.   MRN: [id number]  HPI  Patient presents to clinic today for follow-up of chronic conditions.  Migraines: These occur 3-4 times a month.  Triggered by smells, computer screens.  She takes Imitrex and Phenergan as needed with some relief of symptoms.  She does not follow with neurology.  HTN: Her BP today is 124/66.  She is taking Amlodipine as prescribed.  ECG from 01/2020 reviewed.  GERD: Status post gastric bypass.  She no longer takes Esomeprazole because insurance would not cover it. There is no upper GI on file.  HLD: Her last LDL was 92, triglycerides 145, 12/2021.  She is not taking any cholesterol-lowering medication at this time.  She does not consume low-fat diet.  Anxiety and Depression: Chronic, managed on Xanax as needed.  She is not currently seeing a therapist.  She denies SI/HI.  ADD: Managed with Adderall.  She does not follow with psychiatry.  Review of Systems     Past Medical History:  Diagnosis Date   Alcohol abuse    Anxiety    Asthma    GERD (gastroesophageal reflux disease)    Heart murmur    while pregnant   Hypertension    Migraine     Current Outpatient Medications  Medication Sig Dispense Refill   albuterol (VENTOLIN HFA) 108 (90 Base) MCG/ACT inhaler Inhale 2 puffs into the lungs every 6 (six) hours as needed for wheezing or shortness of breath. 18 g 0   ALPRAZolam (XANAX) 0.5 MG tablet Take 1 tablet (0.5 mg total) by mouth daily as needed. Please schedule an office visit before anymore refills. 20 tablet 0   amLODipine (NORVASC) 5 MG tablet TAKE 1 TABLET BY MOUTH EVERY DAY 90 tablet 1   amphetamine-dextroamphetamine (ADDERALL XR) 20 MG 24 hr capsule Take 1 capsule (20 mg total) by mouth every morning. 30 capsule 0   benzonatate (TESSALON) 100 MG capsule Take 1 capsule (100 mg total) by mouth 2 (two) times daily as needed for cough. 20 capsule 0    brompheniramine-pseudoephedrine-DM 30-2-10 MG/5ML syrup Take 5 mLs by mouth 4 (four) times daily as needed. 120 mL 0   esomeprazole (NEXIUM) 40 MG capsule Take 1 capsule (40 mg total) by mouth daily. 90 capsule 1   fluticasone (FLONASE) 50 MCG/ACT nasal spray Place 2 sprays into both nostrils daily. 16 g 0   ipratropium (ATROVENT) 0.03 % nasal spray Place 2 sprays into both nostrils every 12 (twelve) hours. 30 mL 0   ondansetron (ZOFRAN-ODT) 4 MG disintegrating tablet Take 1 tablet (4 mg total) by mouth every 8 (eight) hours as needed for nausea or vomiting. 40 tablet 0   predniSONE (DELTASONE) 10 MG tablet Take 4 tablets ('40mg'$ ) on days 1-4, then 3 tablets ('30mg'$ ) on days 5-8, then 2 tablets ('20mg'$ ) on days 9-11, then 1 tablet daily for days 12-14. Take with food. 37 tablet 0   SUMAtriptan (IMITREX) 100 MG tablet 1 TAB BY MOUTH EVERY 2 HRS AS NEEDED FOR MIGRAINE.MAY REPEAT IN 2 HOURS IF HEADACHE PERSISTSRECURS. 10 tablet 0   No current facility-administered medications for this visit.    Allergies  Allergen Reactions   Nsaids Other (See Comments)    Family History  Problem Relation Age of Onset   Hypertension Mother    Uterine cancer Mother    Stroke Mother    Heart attack Mother    Hypertension Father  Diabetes Father    Multiple sclerosis Father    Cancer Maternal Grandmother        PANCREATIC   Hypertension Maternal Grandmother    Diabetes Maternal Grandmother    Cancer Maternal Grandfather        LUNG   Hypertension Maternal Grandfather    Diabetes Maternal Grandfather    Cancer Paternal Grandmother        THROAT   Alzheimer's disease Paternal Grandmother    Diabetes Maternal Aunt    Diabetes Maternal Uncle     Social History   Socioeconomic History   Marital status: Divorced    Spouse name: Not on file   Number of children: Not on file   Years of education: Not on file   Highest education level: Not on file  Occupational History   Not on file  Tobacco Use    Smoking status: Never   Smokeless tobacco: Never  Vaping Use   Vaping Use: Never used  Substance and Sexual Activity   Alcohol use: Yes    Comment: occ   Drug use: No   Sexual activity: Not on file  Other Topics Concern   Not on file  Social History Narrative   Not on file   Social Determinants of Health   Financial Resource Strain: Not on file  Food Insecurity: No Food Insecurity (03/16/2022)   Hunger Vital Sign    Worried About Running Out of Food in the Last Year: Never true    Ran Out of Food in the Last Year: Never true  Transportation Needs: No Transportation Needs (03/16/2022)   PRAPARE - Hydrologist (Medical): No    Lack of Transportation (Non-Medical): No  Physical Activity: Not on file  Stress: No Stress Concern Present (03/16/2022)   [place]    Feeling of Stress : Only a little  Social Connections: Unknown (03/16/2022)   Social Connection and Isolation Panel [NHANES]    Frequency of Communication with Friends and Family: More than three times a week    Frequency of Social Gatherings with Friends and Family: Not on file    Attends Religious Services: Not on file    Active Member of Clubs or Organizations: Not on file    Attends Archivist Meetings: Not on file    Marital Status: Divorced  Intimate Partner Violence: Not At Risk (03/16/2022)   Humiliation, Afraid, Rape, and Kick questionnaire    Fear of Current or Ex-Partner: No    Emotionally Abused: No    Physically Abused: No    Sexually Abused: No     Constitutional: Patient reports intermittent headaches.  Denies fever, malaise, fatigue, or abrupt weight changes.  HEENT: Patient reports left ear pain.  Denies eye pain, eye redness, ear pain, ringing in the ears, wax buildup, runny nose, nasal congestion, bloody nose, or sore throat. Respiratory: Denies difficulty breathing, shortness of breath, cough or sputum  production.   Cardiovascular: Denies chest pain, chest tightness, palpitations or swelling in the hands or feet.  Gastrointestinal: Patient reports intermittent reflux.  Denies abdominal pain, bloating, constipation, diarrhea or blood in the stool.  GU: Denies urgency, frequency, pain with urination, burning sensation, blood in urine, odor or discharge. Musculoskeletal: Denies decrease in range of motion, difficulty with gait, muscle pain or joint pain and swelling.  Skin: Denies redness, rashes, lesions or ulcercations.  Neurological: Patient reports inattention.  Denies dizziness, difficulty with memory, difficulty  with speech or problems with balance and coordination.  Psych: Patient has a history of anxiety and depression denies SI/HI.  No other specific complaints in a complete review of systems (except as listed in HPI above).  Objective:   Physical Exam BP 124/66 (BP Location: Right Arm, Patient Position: Sitting, Cuff Size: Large)   Pulse 70   Temp (!) 96.4 F (35.8 C) (Temporal)   Wt 190 lb (86.2 kg)   LMP 07/12/2011   SpO2 100%   BMI 28.06 kg/m   Wt Readings from Last 3 Encounters:  06/28/22 190 lb (86.2 kg)  12/17/21 193 lb (87.5 kg)  08/08/21 190 lb (86.2 kg)    General: Appears her stated age, overweight, in NAD. Skin: Warm, dry and intact. HEENT: Head: normal shape and size; Eyes: sclera white, no icterus, conjunctiva pink, PERRLA and EOMs intact; Ears: Tm's gray with tubes grown into the TM;  Cardiovascular: Normal rate and rhythm. S1,S2 noted.  No murmur, rubs or gallops noted. No JVD or BLE edema. Pulmonary/Chest: Normal effort and positive vesicular breath sounds. No respiratory distress. No wheezes, rales or ronchi noted.  Abdomen: Soft and nontender. Normal bowel sounds.  Musculoskeletal: No difficulty with gait.  Neurological: Alert and oriented. Coordination normal.  Psychiatric: Mood and affect normal. Behavior is normal. Judgment and thought content  normal.     BMET    Component Value Date/Time   NA 139 12/17/2021 0914   NA 137 05/31/2013 0846   K 4.0 12/17/2021 0914   K 3.8 05/31/2013 0846   CL 106 12/17/2021 0914   CL 106 05/31/2013 0846   CO2 25 12/17/2021 0914   CO2 26 05/31/2013 0846   GLUCOSE 98 12/17/2021 0914   GLUCOSE 93 05/31/2013 0846   BUN 15 12/17/2021 0914   BUN 10 05/31/2013 0846   CREATININE 0.81 12/17/2021 0914   CALCIUM 9.1 12/17/2021 0914   CALCIUM 8.2 (L) 05/31/2013 0846   GFRNONAA >60 08/12/2020 1525   GFRNONAA >60 05/31/2013 0846   GFRAA >60 02/08/2020 0600   GFRAA >60 05/31/2013 0846    Lipid Panel     Component Value Date/Time   CHOL 216 (H) 12/17/2021 0914   TRIG 145 12/17/2021 0914   HDL 99 12/17/2021 0914   CHOLHDL 2.2 12/17/2021 0914   VLDL 35.2 05/17/2019 1606   LDLCALC 92 12/17/2021 0914    CBC    Component Value Date/Time   WBC 7.7 12/17/2021 0914   RBC 4.11 12/17/2021 0914   HGB 12.6 12/17/2021 0914   HGB 13.3 05/31/2013 0846   HCT 38.2 12/17/2021 0914   HCT 39.2 05/31/2013 0846   PLT 275 12/17/2021 0914   PLT 219 05/31/2013 0846   MCV 92.9 12/17/2021 0914   MCV 88 05/31/2013 0846   MCH 30.7 12/17/2021 0914   MCHC 33.0 12/17/2021 0914   RDW 12.6 12/17/2021 0914   RDW 12.8 05/31/2013 0846   LYMPHSABS 2.5 08/12/2020 1525   LYMPHSABS 2.1 05/31/2013 0846   MONOABS 0.5 08/12/2020 1525   MONOABS 0.4 05/31/2013 0846   EOSABS 0.0 08/12/2020 1525   EOSABS 0.1 05/31/2013 0846   BASOSABS 0.1 08/12/2020 1525   BASOSABS 0.1 05/31/2013 0846    Hgb A1C Lab Results  Component Value Date   HGBA1C 5.5 12/17/2021           Assessment & Plan:   Acute Headache, Left Ear Pain:  I think the headache is coming from the impacted tubes in her tympanic membrane Toradol 30 mg  IM x 1 Advised her to follow-up with ENT regarding her tubes   RTC in 6 months for annual exam Webb Silversmith, NP

## 2022-08-11 NOTE — Assessment & Plan Note (Signed)
Continue Imitrex and Phenergan as needed

## 2022-08-11 NOTE — Assessment & Plan Note (Signed)
Encouraged diet and exercise for weight loss ?

## 2022-08-11 NOTE — Addendum Note (Signed)
Addended by: Ashley Royalty E on: 08/11/2022 08:51 AM   Modules accepted: Orders

## 2022-08-11 NOTE — Assessment & Plan Note (Signed)
Will refill as omeprazole to see if we can get this covered by insurance

## 2022-08-11 NOTE — Assessment & Plan Note (Signed)
Continue Xanax as needed Support offered

## 2022-08-11 NOTE — Patient Instructions (Signed)

## 2022-08-11 NOTE — Assessment & Plan Note (Signed)
Controlled on amlodipine Reinforced DASH diet and exercise for weight loss C-Met today

## 2022-08-12 LAB — COMPLETE METABOLIC PANEL WITH GFR
AG Ratio: 1.6 (calc) (ref 1.0–2.5)
ALT: 19 U/L (ref 6–29)
AST: 23 U/L (ref 10–30)
Albumin: 4.1 g/dL (ref 3.6–5.1)
Alkaline phosphatase (APISO): 70 U/L (ref 31–125)
BUN: 9 mg/dL (ref 7–25)
CO2: 27 mmol/L (ref 20–32)
Calcium: 8.7 mg/dL (ref 8.6–10.2)
Chloride: 103 mmol/L (ref 98–110)
Creat: 0.73 mg/dL (ref 0.50–0.99)
Globulin: 2.6 g/dL (calc) (ref 1.9–3.7)
Glucose, Bld: 89 mg/dL (ref 65–99)
Potassium: 4.1 mmol/L (ref 3.5–5.3)
Sodium: 138 mmol/L (ref 135–146)
Total Bilirubin: 0.5 mg/dL (ref 0.2–1.2)
Total Protein: 6.7 g/dL (ref 6.1–8.1)
eGFR: 104 mL/min/{1.73_m2} (ref >=60)

## 2022-08-12 LAB — CBC
HCT: 39.5 % (ref 35.0–45.0)
Hemoglobin: 13.6 g/dL (ref 11.7–15.5)
MCH: 32.4 pg (ref 27.0–33.0)
MCHC: 34.4 g/dL (ref 32.0–36.0)
MCV: 94 fL (ref 80.0–100.0)
MPV: 11 fL (ref 7.5–12.5)
Platelets: 258 10*3/uL (ref 140–400)
RBC: 4.2 10*6/uL (ref 3.80–5.10)
RDW: 12.3 % (ref 11.0–15.0)
WBC: 6.6 10*3/uL (ref 3.8–10.8)

## 2022-08-12 LAB — HEMOGLOBIN A1C
Hgb A1c MFr Bld: 5.6 % of total Hgb (ref <5.7)
Mean Plasma Glucose: 114 mg/dL
eAG (mmol/L): 6.3 mmol/L

## 2022-08-12 LAB — LIPID PANEL
Cholesterol: 226 mg/dL — ABNORMAL HIGH (ref <200)
HDL: 103 mg/dL (ref >=50)
LDL Cholesterol (Calc): 102 mg/dL (calc) — ABNORMAL HIGH
Non-HDL Cholesterol (Calc): 123 mg/dL (calc) (ref <130)
Total CHOL/HDL Ratio: 2.2 (calc) (ref <5.0)
Triglycerides: 118 mg/dL (ref <150)

## 2022-08-13 ENCOUNTER — Encounter: Payer: Self-pay | Admitting: Internal Medicine

## 2022-08-22 ENCOUNTER — Telehealth: Payer: Federal, State, Local not specified - PPO | Admitting: Physician Assistant

## 2022-08-22 DIAGNOSIS — B3731 Acute candidiasis of vulva and vagina: Secondary | ICD-10-CM

## 2022-08-22 MED ORDER — FLUCONAZOLE 150 MG PO TABS: 150.0000 mg | ORAL_TABLET | Freq: Every day | ORAL | 0 refills | Status: DC

## 2022-08-22 NOTE — Progress Notes (Signed)

## 2022-08-27 ENCOUNTER — Other Ambulatory Visit: Payer: Self-pay | Admitting: Internal Medicine

## 2022-08-27 DIAGNOSIS — G43109 Migraine with aura, not intractable, without status migrainosus: Secondary | ICD-10-CM

## 2022-08-27 MED ORDER — AMPHETAMINE-DEXTROAMPHET ER 20 MG PO CP24: 20.0000 mg | ORAL_CAPSULE | ORAL | 0 refills | Status: DC

## 2022-08-27 MED ORDER — SUMATRIPTAN SUCCINATE 100 MG PO TABS: 100.0000 mg | ORAL_TABLET | ORAL | 0 refills | Status: DC | PRN

## 2022-09-09 ENCOUNTER — Other Ambulatory Visit: Payer: Self-pay | Admitting: Internal Medicine

## 2022-09-10 MED ORDER — ALPRAZOLAM 0.5 MG PO TABS: 0.5000 mg | ORAL_TABLET | Freq: Every day | ORAL | 0 refills | Status: DC | PRN

## 2022-09-10 NOTE — Telephone Encounter (Signed)
Unable to refill per protocol, Rx request is too soon. Last refill 09/10/22 or 20 tab.  Requested Prescriptions  Pending Prescriptions Disp Refills   ALPRAZolam (XANAX) 0.5 MG tablet [Pharmacy Med Name: ALPRAZOLAM 0.5 MG TABLET] 20 tablet 0    Sig: TAKE 1 TABLET (0.5 MG TOTAL) BY MOUTH DAILY AS NEEDED. PLEASE SCHEDULE AN OFFICE VISIT BEFORE ANYMORE REFILLS.     Not Delegated - Psychiatry: Anxiolytics/Hypnotics 2 Failed - 09/09/2022  2:19 PM      Failed - This refill cannot be delegated      Failed - Urine Drug Screen completed in last 360 days      Passed - Patient is not pregnant      Passed - Valid encounter within last 6 months    Recent Outpatient Visits           1 month ago Attention deficit hyperactivity disorder (ADHD), predominantly inattentive type   [facility] [place], Coralie Keens, NP   8 months ago Encounter for general adult medical examination with abnormal findings   [facility] [place], Coralie Keens, NP   1 year ago Left-sided chest pain   [facility] [place], Coralie Keens, NP   1 year ago Encounter for general adult medical examination with abnormal findings   [facility] [place], Coralie Keens, NP   1 year ago Primary hypertension   [facility] [place], Coralie Keens, [place]

## 2022-09-21 ENCOUNTER — Other Ambulatory Visit: Payer: Self-pay | Admitting: Internal Medicine

## 2022-09-21 DIAGNOSIS — G43109 Migraine with aura, not intractable, without status migrainosus: Secondary | ICD-10-CM

## 2022-09-21 MED ORDER — SUMATRIPTAN SUCCINATE 100 MG PO TABS: 100.0000 mg | ORAL_TABLET | ORAL | 2 refills | Status: DC | PRN

## 2022-10-05 ENCOUNTER — Encounter: Payer: Self-pay | Admitting: Internal Medicine

## 2022-10-05 ENCOUNTER — Other Ambulatory Visit: Payer: Self-pay | Admitting: Internal Medicine

## 2022-10-05 MED ORDER — AMPHETAMINE-DEXTROAMPHET ER 20 MG PO CP24: 20.0000 mg | ORAL_CAPSULE | ORAL | 0 refills | Status: DC

## 2022-10-05 MED ORDER — ALPRAZOLAM 0.5 MG PO TABS: 0.5000 mg | ORAL_TABLET | Freq: Every day | ORAL | 0 refills | Status: DC | PRN

## 2022-10-05 NOTE — Telephone Encounter (Signed)
Pt called back and scheduled an appt for 10/08/22 at 9:20am.  Pt is wanting to know if her refill can be sent to the pharmacy since she scheduled her appt.  Please advise.

## 2022-10-07 ENCOUNTER — Telehealth: Payer: Federal, State, Local not specified - PPO | Admitting: Physician Assistant

## 2022-10-07 DIAGNOSIS — B379 Candidiasis, unspecified: Secondary | ICD-10-CM | POA: Diagnosis not present

## 2022-10-07 DIAGNOSIS — T3695XA Adverse effect of unspecified systemic antibiotic, initial encounter: Secondary | ICD-10-CM

## 2022-10-07 MED ORDER — FLUCONAZOLE 150 MG PO TABS: ORAL_TABLET | ORAL | 0 refills | Status: DC

## 2022-10-07 NOTE — Progress Notes (Signed)
I have spent 5 minutes in review of e-visit questionnaire, review and updating patient chart, medical decision making and response to patient.   Jameon Deller Cody Trammell Bowden, PA-C    

## 2022-10-07 NOTE — Progress Notes (Signed)

## 2022-10-08 ENCOUNTER — Ambulatory Visit: Payer: Federal, State, Local not specified - PPO | Admitting: Internal Medicine

## 2022-10-10 ENCOUNTER — Other Ambulatory Visit: Payer: Self-pay | Admitting: Internal Medicine

## 2022-10-10 DIAGNOSIS — G43109 Migraine with aura, not intractable, without status migrainosus: Secondary | ICD-10-CM

## 2022-10-11 NOTE — Telephone Encounter (Signed)
Requested Prescriptions  Refused Prescriptions Disp Refills   SUMAtriptan (IMITREX) 100 MG tablet [Pharmacy Med Name: SUMATRIPTAN SUCC 100 MG TABLET] 8 tablet 1    Sig: 1TAB BY MOUTH EVERY 2HRS AS NEEDED FOR MIGRAINE*MAY REPEAT IN 2HRS IF HEADACHE PERSISTS OR RECURS.     Neurology:  Migraine Therapy - Triptan Passed - 10/10/2022 11:10 AM      Passed - Last BP in normal range    BP Readings from Last 1 Encounters:  08/11/22 124/66         Passed - Valid encounter within last 12 months    Recent Outpatient Visits           2 months ago Attention deficit hyperactivity disorder (ADHD), predominantly inattentive type   [facility] [place], Coralie Keens, NP   9 months ago Encounter for general adult medical examination with abnormal findings   [facility] [place], Coralie Keens, NP   1 year ago Left-sided chest pain   [facility] [place], Coralie Keens, NP   1 year ago Encounter for general adult medical examination with abnormal findings   [facility] [place], Coralie Keens, NP   1 year ago Primary hypertension   [facility] [place], Coralie Keens, [place]

## 2022-10-22 ENCOUNTER — Telehealth: Payer: Federal, State, Local not specified - PPO

## 2022-10-22 ENCOUNTER — Telehealth: Payer: Federal, State, Local not specified - PPO | Admitting: Physician Assistant

## 2022-10-22 DIAGNOSIS — H60392 Other infective otitis externa, left ear: Secondary | ICD-10-CM

## 2022-10-22 DIAGNOSIS — H66002 Acute suppurative otitis media without spontaneous rupture of ear drum, left ear: Secondary | ICD-10-CM | POA: Diagnosis not present

## 2022-10-22 MED ORDER — FLUCONAZOLE 150 MG PO TABS: 150.0000 mg | ORAL_TABLET | ORAL | 0 refills | Status: DC | PRN

## 2022-10-22 MED ORDER — AMOXICILLIN-POT CLAVULANATE 875-125 MG PO TABS: 1.0000 | ORAL_TABLET | Freq: Two times a day (BID) | ORAL | 0 refills | Status: DC

## 2022-10-22 NOTE — Progress Notes (Signed)

## 2022-10-25 ENCOUNTER — Telehealth: Payer: Federal, State, Local not specified - PPO | Admitting: Physician Assistant

## 2022-10-25 DIAGNOSIS — B029 Zoster without complications: Secondary | ICD-10-CM

## 2022-10-25 MED ORDER — VALACYCLOVIR HCL 1 G PO TABS: 1000.0000 mg | ORAL_TABLET | Freq: Three times a day (TID) | ORAL | 0 refills | Status: AC

## 2022-10-25 MED ORDER — GABAPENTIN 300 MG PO CAPS: 300.0000 mg | ORAL_CAPSULE | Freq: Two times a day (BID) | ORAL | 0 refills | Status: DC

## 2022-10-25 NOTE — Progress Notes (Signed)
I have spent 5 minutes in review of e-visit questionnaire, review and updating patient chart, medical decision making and response to patient.   Petula Rotolo Cody Kassey [place]forest, PA-C    

## 2022-10-25 NOTE — Progress Notes (Signed)
E-visit for Shingles   We are sorry that you are not feeling well. Here is how we plan to help!  Based on what you shared with me it looks like you have shingles.  Shingles or herpes zoster, is a common infection of the nerves.  It is a painful rash caused by the herpes zoster virus.  This is the same virus that causes chickenpox.  After a person has chickenpox, the virus remains inactive in the nerve cells.  Years later, the virus can become active again and travel to the skin.  It typically will appear on one side of the face or body.  Burning or shooting pain, tingling, or itching are early signs of the infection.  Blisters typically scab over in 7 to 10 days and clear up within 2-4 weeks. Shingles is only contagious to people that have never had the chickenpox, the chickenpox vaccine, or anyone who has a compromised immune system.  You should avoid contact with these type of people until your blisters scab over.  I have prescribed Valacyclovir 1g three times daily for 7 days and also Gabapentin 300mg twice daily as needed for pain   HOME CARE: . Apply ice packs (wrapped in a thin towel), cool compresses, or soak in cool bath to help reduce pain. . Use calamine lotion to calm itchy skin. . Avoid scratching the rash. . Avoid direct sunlight.  GET HELP RIGHT AWAY IF: . Symptoms that don't away after treatment. . A rash or blisters near your eye. . Increased drainage, fever, or rash after treatment. . Severe pain that doesn't go away.   MAKE SURE YOU    Understand these instructions.  Will watch your condition.  Will get help right away if you are not doing well or get worse.  Thank you for choosing an e-visit. Your e-visit answers were reviewed by a board certified advanced clinical practitioner to complete your personal care plan. Depending upon the condition, your plan could have included both over the counter or prescription medications.  Please review your pharmacy choice.  Make sure the pharmacy is open so you can pick up prescription now. If there is a problem, you may contact your provider through MyChart messaging and have the prescription routed to another pharmacy.  Your safety is important to us. If you have drug allergies check your prescription carefully.   For the next 24 hours you can use MyChart to ask questions about today's visit, request a non-urgent call back, or ask for a work or school excuse.  You will get an email in the next two days asking about your experience. I hope that your e-visit has been valuable and will speed your recovery  

## 2022-10-25 NOTE — Progress Notes (Signed)
Message sent to patient requesting further input regarding current symptoms. Awaiting patient response.  

## 2022-11-09 ENCOUNTER — Other Ambulatory Visit: Payer: Self-pay | Admitting: Internal Medicine

## 2022-11-09 MED ORDER — ALPRAZOLAM 0.5 MG PO TABS: 0.5000 mg | ORAL_TABLET | Freq: Every day | ORAL | 0 refills | Status: DC | PRN

## 2022-12-06 ENCOUNTER — Other Ambulatory Visit: Payer: Self-pay | Admitting: Internal Medicine

## 2022-12-08 MED ORDER — ALPRAZOLAM 0.5 MG PO TABS: 0.5000 mg | ORAL_TABLET | Freq: Every day | ORAL | 0 refills | Status: DC | PRN

## 2022-12-15 ENCOUNTER — Other Ambulatory Visit: Payer: Self-pay | Admitting: Internal Medicine

## 2022-12-16 MED ORDER — AMPHETAMINE-DEXTROAMPHET ER 20 MG PO CP24: 20.0000 mg | ORAL_CAPSULE | ORAL | 0 refills | Status: DC

## 2022-12-21 ENCOUNTER — Encounter (HOSPITAL_COMMUNITY): Payer: Self-pay | Admitting: *Deleted

## 2022-12-22 ENCOUNTER — Encounter: Payer: Self-pay | Admitting: Internal Medicine

## 2022-12-22 DIAGNOSIS — G43109 Migraine with aura, not intractable, without status migrainosus: Secondary | ICD-10-CM

## 2022-12-22 MED ORDER — SUMATRIPTAN SUCCINATE 100 MG PO TABS: 100.0000 mg | ORAL_TABLET | ORAL | 2 refills | Status: DC | PRN

## 2023-01-06 ENCOUNTER — Other Ambulatory Visit: Payer: Self-pay | Admitting: Internal Medicine

## 2023-01-06 DIAGNOSIS — M25561 Pain in right knee: Secondary | ICD-10-CM | POA: Diagnosis not present

## 2023-01-06 DIAGNOSIS — M25562 Pain in left knee: Secondary | ICD-10-CM | POA: Diagnosis not present

## 2023-01-06 DIAGNOSIS — M79641 Pain in right hand: Secondary | ICD-10-CM | POA: Diagnosis not present

## 2023-01-06 DIAGNOSIS — M25531 Pain in right wrist: Secondary | ICD-10-CM | POA: Diagnosis not present

## 2023-01-06 MED ORDER — ALPRAZOLAM 0.5 MG PO TABS: 0.5000 mg | ORAL_TABLET | Freq: Every day | ORAL | 0 refills | Status: DC | PRN

## 2023-01-19 ENCOUNTER — Telehealth: Payer: Federal, State, Local not specified - PPO

## 2023-01-19 ENCOUNTER — Telehealth: Payer: Federal, State, Local not specified - PPO | Admitting: Nurse Practitioner

## 2023-01-19 DIAGNOSIS — T148XXA Other injury of unspecified body region, initial encounter: Secondary | ICD-10-CM | POA: Diagnosis not present

## 2023-01-19 DIAGNOSIS — L089 Local infection of the skin and subcutaneous tissue, unspecified: Secondary | ICD-10-CM | POA: Diagnosis not present

## 2023-01-19 MED ORDER — CEPHALEXIN 500 MG PO CAPS: 500.0000 mg | ORAL_CAPSULE | Freq: Four times a day (QID) | ORAL | 0 refills | Status: AC

## 2023-01-19 NOTE — Progress Notes (Signed)
E-Visit for Simple Cut/Laceration  We are sorry that you have had an injury. Here is how we plan to help! Meds ordered this encounter  Medications   cephALEXin (KEFLEX) 500 MG capsule    Sig: Take 1 capsule (500 mg total) by mouth 4 (four) times daily for 5 days.    Dispense:  20 capsule    Refill:  0     HOME CARE: Clean the cut or scrape - Wash it well with soap and water. * avoid using hydrogen peroxide which may cause tissue damage, or impede wound healing.  Stop the bleeding - If your cut or scrape is bleeding, press a clean cloth or bandage firmly on the area for 20 minutes. You can also help slow the bleeding by holding the cut above the level of your heart.   Put a thin layer of Bacitracin antibiotic ointment on the cut or scrape. (this can be purchased at any local pharmacy- ask your pharmacist if you need assistance)   Cover the cut or scrape with a bandage or gauze. Keep the bandage clean and dry. Change the bandage 1 to 2 times every day until your cut or scrape heals.   Watch for signs that your cut or scrape is infected (redness, drainage, pain, warmth, swelling or fever)  Over the next 48 hours your wound should start to improve with less pain, less swelling and less redness. If you should develop increasing pain, swelling, redness, fever, pus from the wound you should be seen immediately to make sure this is not becoming infected.   WOUND CARE: Please keep a layer of antibiotic ointment (bacitracin preferred) on this wound at least twice a day for the next seven days and keep a sterile dressing over top of it. You may gently clean the wound with warm soap and water between dressing changes.  We strongly recommend that you have a medical provider reevaluate your wound within 2 to 3 days in person to make sure that it is healing appropriately.  Thank you for choosing an e-visit.  Your e-visit answers were reviewed by a board certified advanced clinical practitioner to  complete your personal care plan. Depending upon the condition, your plan could have included both over the counter or prescription medications.  Please review your pharmacy choice. Make sure the pharmacy is open so you can pick up prescription now. If there is a problem, you may contact your provider through [employer] and have the prescription routed to another pharmacy.  Your safety is important to [place]. If you have drug allergies check your prescription carefully.   For the next 24 hours you can use MyChart to ask questions about today's visit, request a non-urgent call back, or ask for a work or school excuse. You will get an email in the next two days asking about your experience. I hope that your e-visit has been valuable and will speed your recovery.  I spent approximately 5 minutes reviewing the patient's history, current symptoms and coordinating their care today.

## 2023-01-21 ENCOUNTER — Other Ambulatory Visit: Payer: Self-pay | Admitting: Internal Medicine

## 2023-01-21 MED ORDER — AMPHETAMINE-DEXTROAMPHET ER 20 MG PO CP24: 20.0000 mg | ORAL_CAPSULE | ORAL | 0 refills | Status: DC

## 2023-01-23 ENCOUNTER — Encounter (HOSPITAL_BASED_OUTPATIENT_CLINIC_OR_DEPARTMENT_OTHER): Payer: Self-pay

## 2023-01-23 ENCOUNTER — Emergency Department (HOSPITAL_BASED_OUTPATIENT_CLINIC_OR_DEPARTMENT_OTHER): Payer: Federal, State, Local not specified - PPO

## 2023-01-23 ENCOUNTER — Other Ambulatory Visit: Payer: Self-pay

## 2023-01-23 ENCOUNTER — Emergency Department (HOSPITAL_BASED_OUTPATIENT_CLINIC_OR_DEPARTMENT_OTHER)
Admission: EM | Admit: 2023-01-23 | Discharge: 2023-01-23 | Disposition: A | Discharge status: Home / Self Care | Payer: Federal, State, Local not specified - PPO | Attending: Emergency Medicine | Admitting: Emergency Medicine

## 2023-01-23 DIAGNOSIS — I10 Essential (primary) hypertension: Secondary | ICD-10-CM | POA: Diagnosis not present

## 2023-01-23 DIAGNOSIS — Z79899 Other long term (current) drug therapy: Secondary | ICD-10-CM | POA: Insufficient documentation

## 2023-01-23 DIAGNOSIS — M25462 Effusion, left knee: Secondary | ICD-10-CM | POA: Diagnosis not present

## 2023-01-23 DIAGNOSIS — M25562 Pain in left knee: Secondary | ICD-10-CM | POA: Diagnosis not present

## 2023-01-23 DIAGNOSIS — R600 Localized edema: Secondary | ICD-10-CM | POA: Diagnosis not present

## 2023-01-23 DIAGNOSIS — M79605 Pain in left leg: Secondary | ICD-10-CM | POA: Diagnosis not present

## 2023-01-23 LAB — CBC WITH DIFFERENTIAL/PLATELET
Abs Immature Granulocytes: 0.01 10*3/uL (ref 0.00–0.07)
Basophils Absolute: 0.1 10*3/uL (ref 0.0–0.1)
Basophils Relative: 1 %
Eosinophils Absolute: 0.1 10*3/uL (ref 0.0–0.5)
Eosinophils Relative: 1 %
HCT: 42.5 % (ref 36.0–46.0)
Hemoglobin: 14.3 g/dL (ref 12.0–15.0)
Immature Granulocytes: 0 %
Lymphocytes Relative: 26 %
Lymphs Abs: 1.2 10*3/uL (ref 0.7–4.0)
MCH: 31.9 pg (ref 26.0–34.0)
MCHC: 33.6 g/dL (ref 30.0–36.0)
MCV: 94.9 fL (ref 80.0–100.0)
Monocytes Absolute: 0.3 10*3/uL (ref 0.1–1.0)
Monocytes Relative: 7 %
Neutro Abs: 3 10*3/uL (ref 1.7–7.7)
Neutrophils Relative %: 65 %
Platelets: 180 10*3/uL (ref 150–400)
RBC: 4.48 MIL/uL (ref 3.87–5.11)
RDW: 12.6 % (ref 11.5–15.5)
WBC: 4.6 10*3/uL (ref 4.0–10.5)
nRBC: 0 % (ref 0.0–0.2)

## 2023-01-23 LAB — BASIC METABOLIC PANEL
Anion gap: 9 (ref 5–15)
BUN: 14 mg/dL (ref 6–20)
CO2: 24 mmol/L (ref 22–32)
Calcium: 8.8 mg/dL — ABNORMAL LOW (ref 8.9–10.3)
Chloride: 102 mmol/L (ref 98–111)
Creatinine, Ser: 0.76 mg/dL (ref 0.44–1.00)
GFR, Estimated: >60 mL/min (ref >60)
Glucose, Bld: 106 mg/dL — ABNORMAL HIGH (ref 70–99)
Potassium: 3.7 mmol/L (ref 3.5–5.1)
Sodium: 135 mmol/L (ref 135–145)

## 2023-01-23 MED ORDER — NAPROXEN 500 MG PO TABS: 500.0000 mg | ORAL_TABLET | Freq: Two times a day (BID) | ORAL | 0 refills | Status: DC

## 2023-01-23 MED ORDER — HYDROCODONE-ACETAMINOPHEN 5-325 MG PO TABS
1.0000 | ORAL_TABLET | Freq: Once | ORAL | Status: AC
  Administered 2023-01-23: 1 via ORAL
  Filled 2023-01-23: qty 1

## 2023-01-23 MED ORDER — DOXYCYCLINE HYCLATE 100 MG PO CAPS: 100.0000 mg | ORAL_CAPSULE | Freq: Two times a day (BID) | ORAL | 0 refills | Status: DC

## 2023-01-23 MED ORDER — FLUCONAZOLE 150 MG PO TABS: 150.0000 mg | ORAL_TABLET | Freq: Every day | ORAL | 0 refills | Status: AC

## 2023-01-23 NOTE — Discharge Instructions (Addendum)
It was a pleasure taking care of you today.  As discussed, your workup was reassuring.  X-ray did not show any broken bones.  It did show a little bit of fluid in your joint.  Keep knee brace on as needed for comfort. Ultrasound showed some fluid on your knee. I have included the number of the orthopedic surgeon.  Call tomorrow to schedule an appointment for further evaluation.  Return to the ER for new or worsening symptoms.  I am sending you home with an antibiotic. Take as prescribed and finish all antibiotics

## 2023-01-23 NOTE — ED Triage Notes (Signed)
Pt arrives with c/o left knee pain. Pt recently injured knee and had infection. Per pt, she hit knee on wall and has had new pain and swelling. Pt endorses fevers.

## 2023-01-23 NOTE — ED Provider Notes (Signed)
[facility] Provider Note   CSN: [id number] Arrival date & time: 01/23/23  1548     History  Chief Complaint  Patient presents with   Knee Pain    Linda [name] is a 47 y.o. female with a past medical history significant for anxiety, depression, hypertension, and hyperlipidemia who presents to the ED due to persistent left knee pain.  Patient fell on her left knee on 6/26 and has been having ongoing pain since.  Patient was treated for infected laceration of her left knee on 7/4 with Keflex which she completed.  Patient states erythema resolved however, still having persistent pain.  No numbness/tingling.  Denies difficulties with flexion and extension.  Patient notes she bumped her knee this morning and developed a "bump" on the anterior portion of her knee.  History obtained from patient and past medical records. No interpreter used during encounter.       Home Medications Prior to Admission medications   Medication Sig Start Date End Date Taking? Authorizing Provider  albuterol (VENTOLIN HFA) 108 (90 Base) MCG/ACT inhaler Inhale 2 puffs into the lungs every 6 (six) hours as needed for wheezing or shortness of breath. 12/17/21   Lorre Munroe, NP  ALPRAZolam Prudy Feeler) 0.5 MG tablet Take 1 tablet (0.5 mg total) by mouth daily as needed. Please schedule an office visit before anymore refills. 01/06/23   Lorre Munroe, NP  amLODipine (NORVASC) 5 MG tablet Take 1 tablet (5 mg total) by mouth daily. 08/11/22   Lorre Munroe, NP  amphetamine-dextroamphetamine (ADDERALL XR) 20 MG 24 hr capsule Take 1 capsule (20 mg total) by mouth every morning. 01/21/23   Lorre Munroe, NP  cephALEXin (KEFLEX) 500 MG capsule Take 1 capsule (500 mg total) by mouth 4 (four) times daily for 5 days. 01/19/23 01/24/23  Viviano Simas, FNP  esomeprazole (NEXIUM) 40 MG capsule Take 1 capsule (40 mg total) by mouth daily. 08/11/22   Lorre Munroe, NP  fluconazole  (DIFLUCAN) 150 MG tablet Take 1 tablet (150 mg total) by mouth every 3 (three) days as needed. 10/22/22   Margaretann Loveless, PA-C  gabapentin (NEURONTIN) 300 MG capsule Take 1 capsule (300 mg total) by mouth 2 (two) times daily. 10/25/22   Waldon Merl, PA-C  ondansetron (ZOFRAN-ODT) 4 MG disintegrating tablet Take 1 tablet (4 mg total) by mouth every 8 (eight) hours as needed for nausea or vomiting. 01/02/22   Claiborne Rigg, NP  SUMAtriptan (IMITREX) 100 MG tablet Take 1 tablet (100 mg total) by mouth every 2 (two) hours as needed for migraine. May repeat in 2 hours if headache persists or recurs. 12/22/22   Lorre Munroe, NP      Allergies    Nsaids    Review of Systems   Review of Systems  Constitutional:  Negative for chills and fever.  Musculoskeletal:  Positive for arthralgias.    Physical Exam Updated Vital Signs BP (!) 144/94   Pulse 68   Temp (!) 97.5 F (36.4 C)   Resp 16   Ht 5\' 9"  (1.753 m)   Wt 86.2 kg   LMP 07/12/2011   SpO2 98%   BMI 28.06 kg/m  Physical Exam Vitals and nursing note reviewed.  Constitutional:      General: She is not in acute distress.    Appearance: She is not ill-appearing.  HENT:     Head: Normocephalic.  Eyes:  Pupils: Pupils are equal, round, and reactive to light.  Cardiovascular:     Rate and Rhythm: Normal rate and regular rhythm.     Pulses: Normal pulses.     Heart sounds: Normal heart sounds. No murmur heard.    No friction rub. No gallop.  Pulmonary:     Effort: Pulmonary effort is normal.     Breath sounds: Normal breath sounds.  Abdominal:     General: Abdomen is flat. There is no distension.     Palpations: Abdomen is soft.     Tenderness: There is no abdominal tenderness. There is no guarding or rebound.  Musculoskeletal:        General: Normal range of motion.     Cervical back: Neck supple.     Comments: Able to fully flex and extend left knee. No erythema or warmth of left knee. Pedal pulses palpable.    Skin:    General: Skin is warm and dry.  Neurological:     General: No focal deficit present.     Mental Status: She is alert.  Psychiatric:        Mood and Affect: Mood normal.        Behavior: Behavior normal.     ED Results / Procedures / Treatments   Labs (all labs ordered are listed, but only abnormal results are displayed) Labs Reviewed  BASIC METABOLIC PANEL - Abnormal; Notable for the following components:      Result Value   Glucose, Bld 106 (*)    Calcium 8.8 (*)    All other components within normal limits  CBC WITH DIFFERENTIAL/PLATELET    EKG None  Radiology DG Knee Complete 4 Views Left  Result Date: 01/23/2023 CLINICAL DATA:  Knee pain EXAM: LEFT KNEE - COMPLETE 4+ VIEW COMPARISON:  None Available. FINDINGS: No fracture or malalignment. Small knee effusion. Joint spaces are patent IMPRESSION: Small knee effusion. Electronically Signed   By: Jasmine Pang M.D.   On: 01/23/2023 17:15    Procedures Procedures    Medications Ordered in ED Medications  HYDROcodone-acetaminophen (NORCO/VICODIN) 5-325 MG per tablet 1 tablet (1 tablet Oral Given 01/23/23 1737)    ED Course/ Medical Decision Making/ A&P                             Medical Decision Making Amount and/or Complexity of Data Reviewed External Data Reviewed: notes. Labs: ordered. Decision-making details documented in ED Course. Radiology: ordered and independent interpretation performed. Decision-making details documented in ED Course.  Risk Prescription drug management.   This patient presents to the ED for concern of knee pain, this involves an extensive number of treatment options, and is a complaint that carries with it a high risk of complications and morbidity.  The differential diagnosis includes bony fracture, septic joint, DVT, etc   47 year old female presents to the ED due to persistent left knee pain after falling on her knee on 6/26.  Patient recently treated for infected  laceration to left knee with Keflex which she finished.  Erythema has improved however, patient bumped her knee earlier today and is now having significant pain.  No fever or chills.  No drainage from the knee.  Upon arrival, stable vitals.  Patient in no acute distress.  Small effusion to left knee.  No erythema or warmth.  Full range of motion of left knee.  Low suspicion for septic joint.  X-ray ordered to rule out bony  fracture.  Patient also admits to some calf pain.  Ultrasound rule out DVT given recent trauma.  Routine labs ordered in triage. Pain medication given.   CBC unremarkable.  No leukocytosis.  Normal hemoglobin.  BMP reassuring.  Normal renal function.  No major electrolyte derangements.  X-ray personally reviewed and interpreted which demonstrates a small knee effusion.  No evidence of bony fracture. Patient placed in knee brace.  Ultrasound negative for DVT.  Does demonstrate complex subcutaneous fluid collection.  Given no leukocytosis or infectious symptoms lower suspicion for infectious etiology however, will cover with doxycycline.  No evidence of septic joint.  Orthopedics number given to patient at discharge and advised to call to schedule an appointment for further evaluation. Strict ED precautions discussed with patient. Patient states understanding and agrees to plan. Patient discharged home in no acute distress and stable vitals   Lives at home Has PCP Hx HTN, anxiety, depression       Final Clinical Impression(s) / ED Diagnoses Final diagnoses:  Acute pain of left knee    Rx / DC Orders ED Discharge Orders     None         Jesusita Oka 01/23/23 1808    Maia Plan, MD 01/26/23 1141

## 2023-02-01 ENCOUNTER — Ambulatory Visit: Payer: Federal, State, Local not specified - PPO | Admitting: Internal Medicine

## 2023-02-01 ENCOUNTER — Other Ambulatory Visit: Payer: Self-pay | Admitting: Internal Medicine

## 2023-02-01 NOTE — Progress Notes (Deleted)
Subjective:    Patient ID: Linda [name], female    DOB: 05-22-1976, 47 y.o.   MRN: [id number]  HPI  Patient presents the clinic today for her annual exam.  Flu: 04/2021 Tetanus: 08/2015 COVID: X 3 Pap smear: Hysterectomy Mammogram: 04/2019 Colon screening: Never Vision screening: Dentist:  Diet: Exercise:  Review of Systems     Past Medical History:  Diagnosis Date   Alcohol abuse    Anxiety    Asthma    GERD (gastroesophageal reflux disease)    Heart murmur    while pregnant   Hypertension    Migraine     Current Outpatient Medications  Medication Sig Dispense Refill   albuterol (VENTOLIN HFA) 108 (90 Base) MCG/ACT inhaler Inhale 2 puffs into the lungs every 6 (six) hours as needed for wheezing or shortness of breath. 18 g 0   ALPRAZolam (XANAX) 0.5 MG tablet Take 1 tablet (0.5 mg total) by mouth daily as needed. Please schedule an office visit before anymore refills. 20 tablet 0   amLODipine (NORVASC) 5 MG tablet Take 1 tablet (5 mg total) by mouth daily. 90 tablet 1   amphetamine-dextroamphetamine (ADDERALL XR) 20 MG 24 hr capsule Take 1 capsule (20 mg total) by mouth every morning. 30 capsule 0   doxycycline (VIBRAMYCIN) 100 MG capsule Take 1 capsule (100 mg total) by mouth 2 (two) times daily. 20 capsule 0   esomeprazole (NEXIUM) 40 MG capsule Take 1 capsule (40 mg total) by mouth daily. 90 capsule 1   fluconazole (DIFLUCAN) 150 MG tablet Take 1 tablet (150 mg total) by mouth every 3 (three) days as needed. 3 tablet 0   gabapentin (NEURONTIN) 300 MG capsule Take 1 capsule (300 mg total) by mouth 2 (two) times daily. 30 capsule 0   naproxen (NAPROSYN) 500 MG tablet Take 1 tablet (500 mg total) by mouth 2 (two) times daily. 30 tablet 0   ondansetron (ZOFRAN-ODT) 4 MG disintegrating tablet Take 1 tablet (4 mg total) by mouth every 8 (eight) hours as needed for nausea or vomiting. 40 tablet 0   SUMAtriptan (IMITREX) 100 MG tablet Take 1 tablet (100 mg total) by  mouth every 2 (two) hours as needed for migraine. May repeat in 2 hours if headache persists or recurs. 10 tablet 2   No current facility-administered medications for this visit.    No Active Allergies  Family History  Problem Relation Age of Onset   Hypertension Mother    Uterine cancer Mother    Stroke Mother    Heart attack Mother    Hypertension Father    Diabetes Father    Multiple sclerosis Father    Cancer Maternal Grandmother        PANCREATIC   Hypertension Maternal Grandmother    Diabetes Maternal Grandmother    Cancer Maternal Grandfather        LUNG   Hypertension Maternal Grandfather    Diabetes Maternal Grandfather    Cancer Paternal Grandmother        THROAT   Alzheimer's disease Paternal Grandmother    Diabetes Maternal Aunt    Diabetes Maternal Uncle     Social History   Socioeconomic History   Marital status: Divorced    Spouse name: Not on file   Number of children: Not on file   Years of education: Not on file   Highest education level: Not on file  Occupational History   Not on file  Tobacco Use   Smoking status:  Never   Smokeless tobacco: Never  Vaping Use   Vaping status: Never Used  Substance and Sexual Activity   Alcohol use: Yes    Comment: occ   Drug use: No   Sexual activity: Not on file  Other Topics Concern   Not on file  Social History Narrative   Not on file   Social Determinants of Health   Financial Resource Strain: Not on file  Food Insecurity: No Food Insecurity (03/16/2022)   Hunger Vital Sign    Worried About Running Out of Food in the Last Year: Never true    Ran Out of Food in the Last Year: Never true  Transportation Needs: No Transportation Needs (03/16/2022)   PRAPARE - Administrator, Civil Service (Medical): No    Lack of Transportation (Non-Medical): No  Physical Activity: Not on file  Stress: No Stress Concern Present (03/16/2022)   [employer] of Occupational Health - Occupational Stress  Questionnaire    Feeling of Stress : Only a little  Social Connections: Unknown (03/16/2022)   Social Connection and Isolation Panel [NHANES]    Frequency of Communication with Friends and Family: More than three times a week    Frequency of Social Gatherings with Friends and Family: Not on file    Attends Religious Services: Not on file    Active Member of Clubs or Organizations: Not on file    Attends Banker Meetings: Not on file    Marital Status: Divorced  Intimate Partner Violence: Not At Risk (03/16/2022)   Humiliation, Afraid, Rape, and Kick questionnaire    Fear of Current or Ex-Partner: No    Emotionally Abused: No    Physically Abused: No    Sexually Abused: No     Constitutional: Patient reports intermittent headaches.  Denies fever, malaise, fatigue, or abrupt weight changes.  HEENT: Denies eye pain, eye redness, ear pain, ringing in the ears, wax buildup, runny nose, nasal congestion, bloody nose, or sore throat. Respiratory: Denies difficulty breathing, shortness of breath, cough or sputum production.   Cardiovascular: Denies chest pain, chest tightness, palpitations or swelling in the hands or feet.  Gastrointestinal: Denies abdominal pain, bloating, constipation, diarrhea or blood in the stool.  GU: Denies urgency, frequency, pain with urination, burning sensation, blood in urine, odor or discharge. Musculoskeletal: Denies decrease in range of motion, difficulty with gait, muscle pain or joint pain and swelling.  Skin: Denies redness, rashes, lesions or ulcercations.  Neurological: Patient reports inattention.  Denies dizziness, difficulty with memory, difficulty with speech or problems with balance and coordination.  Psych: Patient has a history of anxiety and depression.  Denies SI/HI.  No other specific complaints in a complete review of systems (except as listed in HPI above).  Objective:   Physical Exam  LMP 07/12/2011  Wt Readings from Last 3  Encounters:  01/23/23 190 lb (86.2 kg)  08/11/22 190 lb (86.2 kg)  06/28/22 190 lb (86.2 kg)    General: Appears their stated age, well developed, well nourished in NAD. Skin: Warm, dry and intact. No rashes, lesions or ulcerations noted. HEENT: Head: normal shape and size; Eyes: sclera white, no icterus, conjunctiva pink, PERRLA and EOMs intact; Ears: Tm's gray and intact, normal light reflex; Nose: mucosa pink and moist, septum midline; Throat/Mouth: Teeth present, mucosa pink and moist, no exudate, lesions or ulcerations noted.  Neck:  Neck supple, trachea midline. No masses, lumps or thyromegaly present.  Cardiovascular: Normal rate and rhythm. S1,S2 noted.  No murmur, rubs or gallops noted. No JVD or BLE edema. No carotid bruits noted. Pulmonary/Chest: Normal effort and positive vesicular breath sounds. No respiratory distress. No wheezes, rales or ronchi noted.  Abdomen: Soft and nontender. Normal bowel sounds. No distention or masses noted. Liver, spleen and kidneys non palpable. Musculoskeletal: Normal range of motion. No signs of joint swelling. No difficulty with gait.  Neurological: Alert and oriented. Cranial nerves II-XII grossly intact. Coordination normal.  Psychiatric: Mood and affect normal. Behavior is normal. Judgment and thought content normal.   BMET    Component Value Date/Time   NA 135 01/23/2023 1617   NA 137 05/31/2013 0846   K 3.7 01/23/2023 1617   K 3.8 05/31/2013 0846   CL 102 01/23/2023 1617   CL 106 05/31/2013 0846   CO2 24 01/23/2023 1617   CO2 26 05/31/2013 0846   GLUCOSE 106 (H) 01/23/2023 1617   GLUCOSE 93 05/31/2013 0846   BUN 14 01/23/2023 1617   BUN 10 05/31/2013 0846   CREATININE 0.76 01/23/2023 1617   CREATININE 0.73 08/11/2022 0839   CALCIUM 8.8 (L) 01/23/2023 1617   CALCIUM 8.2 (L) 05/31/2013 0846   GFRNONAA >60 01/23/2023 1617   GFRNONAA >60 05/31/2013 0846   GFRAA >60 02/08/2020 0600   GFRAA >60 05/31/2013 0846    Lipid Panel      Component Value Date/Time   CHOL 226 (H) 08/11/2022 0839   TRIG 118 08/11/2022 0839   HDL 103 08/11/2022 0839   CHOLHDL 2.2 08/11/2022 0839   VLDL 35.2 05/17/2019 1606   LDLCALC 102 (H) 08/11/2022 0839    CBC    Component Value Date/Time   WBC 4.6 01/23/2023 1617   RBC 4.48 01/23/2023 1617   HGB 14.3 01/23/2023 1617   HGB 13.3 05/31/2013 0846   HCT 42.5 01/23/2023 1617   HCT 39.2 05/31/2013 0846   PLT 180 01/23/2023 1617   PLT 219 05/31/2013 0846   MCV 94.9 01/23/2023 1617   MCV 88 05/31/2013 0846   MCH 31.9 01/23/2023 1617   MCHC 33.6 01/23/2023 1617   RDW 12.6 01/23/2023 1617   RDW 12.8 05/31/2013 0846   LYMPHSABS 1.2 01/23/2023 1617   LYMPHSABS 2.1 05/31/2013 0846   MONOABS 0.3 01/23/2023 1617   MONOABS 0.4 05/31/2013 0846   EOSABS 0.1 01/23/2023 1617   EOSABS 0.1 05/31/2013 0846   BASOSABS 0.1 01/23/2023 1617   BASOSABS 0.1 05/31/2013 0846    Hgb A1C Lab Results  Component Value Date   HGBA1C 5.6 08/11/2022            Assessment & Plan:   Preventative health maintenance:  Encourage sure to get a flu shot in the fall Tetanus UTD Encouraged her to get her COVID booster She no longer needs to screen for cervical cancer Mammogram ordered-she will call to schedule Referral to GI for screening colonoscopy Encouraged her to consume a balanced diet and exercise regimen Advised her to see an eye doctor and dentist annually Will check CBC, c-Met, lipid, A1c today  RTC in 6 months, follow-up chronic conditions Nicki Reaper, NP

## 2023-02-02 ENCOUNTER — Other Ambulatory Visit: Payer: Self-pay | Admitting: Internal Medicine

## 2023-02-02 NOTE — Telephone Encounter (Signed)
Requested Prescriptions  Pending Prescriptions Disp Refills   amLODipine (NORVASC) 5 MG tablet [Pharmacy Med Name: AMLODIPINE BESYLATE 5 MG TAB] 90 tablet 0    Sig: TAKE 1 TABLET (5 MG TOTAL) BY MOUTH DAILY.     Cardiovascular: Calcium Channel Blockers 2 Failed - 02/01/2023  2:40 AM      Failed - Last BP in normal range    BP Readings from Last 1 Encounters:  01/23/23 (!) 144/94         Passed - Last Heart Rate in normal range    Pulse Readings from Last 1 Encounters:  01/23/23 68         Passed - Valid encounter within last 6 months    Recent Outpatient Visits           5 months ago Attention deficit hyperactivity disorder (ADHD), predominantly inattentive type   [place] [facility] [place], Salvadore Oxford, NP   1 year ago Encounter for general adult medical examination with abnormal findings   [place] [facility] [place], Salvadore Oxford, NP   1 year ago Left-sided chest pain   [place] [facility] [place], Salvadore Oxford, NP   2 years ago Encounter for general adult medical examination with abnormal findings   [place] [facility] [place], Salvadore Oxford, NP   2 years ago Primary hypertension   [facility] [place], Salvadore Oxford, NP       Future Appointments             In 3 weeks Sampson Si, Salvadore Oxford, NP [place] [facility], [facility] [place]

## 2023-02-03 ENCOUNTER — Other Ambulatory Visit: Payer: Self-pay | Admitting: Internal Medicine

## 2023-02-03 NOTE — Telephone Encounter (Signed)
Requested medication (s) are due for refill today -yes  Requested medication (s) are on the active medication list -yes  Future visit scheduled yes  Last refill: 01/06/23 #20  Notes to clinic: non delegated Rx  Requested Prescriptions  Pending Prescriptions Disp Refills   ALPRAZolam (XANAX) 0.5 MG tablet [Pharmacy Med Name: ALPRAZOLAM 0.5 MG TABLET] 20 tablet 0    Sig: TAKE 1 TABLET BY MOUTH DAILY AS NEEDED. PLEASE SCHEDULE AN OFFICE VISIT BEFORE ANYMORE REFILLS.     Not Delegated - Psychiatry: Anxiolytics/Hypnotics 2 Failed - 02/02/2023 10:53 AM      Failed - This refill cannot be delegated      Failed - Urine Drug Screen completed in last 360 days      Passed - Patient is not pregnant      Passed - Valid encounter within last 6 months    Recent Outpatient Visits           5 months ago Attention deficit hyperactivity disorder (ADHD), predominantly inattentive type   [place] [facility] [place], Salvadore Oxford, NP   1 year ago Encounter for general adult medical examination with abnormal findings   [place] [facility] [place], Salvadore Oxford, NP   1 year ago Left-sided chest pain   [place] [facility] [place], Salvadore Oxford, NP   2 years ago Encounter for general adult medical examination with abnormal findings   [place] [facility] [place], Salvadore Oxford, NP   2 years ago Primary hypertension   [place] [facility] [place], Salvadore Oxford, NP       Future Appointments             In 3 weeks Sampson Si, Salvadore Oxford, NP [facility] [place]               Requested Prescriptions  Pending Prescriptions Disp Refills   ALPRAZolam (XANAX) 0.5 MG tablet [Pharmacy Med Name: ALPRAZOLAM 0.5 MG TABLET] 20 tablet 0    Sig: TAKE 1 TABLET BY MOUTH DAILY AS NEEDED. PLEASE SCHEDULE AN OFFICE VISIT BEFORE ANYMORE REFILLS.     Not Delegated - Psychiatry: Anxiolytics/Hypnotics 2 Failed - 02/02/2023 10:53  AM      Failed - This refill cannot be delegated      Failed - Urine Drug Screen completed in last 360 days      Passed - Patient is not pregnant      Passed - Valid encounter within last 6 months    Recent Outpatient Visits           5 months ago Attention deficit hyperactivity disorder (ADHD), predominantly inattentive type   [place] [facility] [place], Salvadore Oxford, NP   1 year ago Encounter for general adult medical examination with abnormal findings   [place] [facility] [place], Salvadore Oxford, NP   1 year ago Left-sided chest pain   [place] [facility] [place], Salvadore Oxford, NP   2 years ago Encounter for general adult medical examination with abnormal findings   [place] [facility] [place], Salvadore Oxford, NP   2 years ago Primary hypertension   [place] [facility] [place], Salvadore Oxford, NP       Future Appointments             In 3 weeks Sampson Si, Salvadore Oxford, NP  [facility],  [facility]

## 2023-02-12 ENCOUNTER — Telehealth: Payer: Federal, State, Local not specified - PPO | Admitting: Family Medicine

## 2023-02-12 DIAGNOSIS — R21 Rash and other nonspecific skin eruption: Secondary | ICD-10-CM | POA: Diagnosis not present

## 2023-02-12 MED ORDER — PREDNISONE 10 MG (21) PO TBPK: ORAL_TABLET | ORAL | 0 refills | Status: DC

## 2023-02-12 NOTE — Progress Notes (Signed)
E Visit for Rash ? ?We are sorry that you are not feeling well. Here is how we plan to help! ? ? ?Prednisone 10 mg daily for 6 days (see taper instructions below) ? ? ?HOME CARE: ? ?Take cool showers and avoid direct sunlight. ?Apply cool compress or wet dressings. ?Take a bath in an oatmeal bath.  Sprinkle content of one Aveeno packet under running faucet with comfortably warm water.  Bathe for 15-20 minutes, 1-2 times daily.  Pat dry with a towel. Do not rub the rash. ?Use hydrocortisone cream. ?Take an antihistamine like Benadryl for widespread rashes that itch.  The adult dose of Benadryl is 25-50 mg by mouth 4 times daily. ?Caution:  This type of medication may cause sleepiness.  Do not drink alcohol, drive, or operate dangerous machinery while taking antihistamines.  Do not take these medications if you have prostate enlargement.  Read package instructions thoroughly on all medications that you take. ? ?GET HELP RIGHT AWAY IF: ? ?Symptoms don't go away after treatment. ?Severe itching that persists. ?If you rash spreads or swells. ?If you rash begins to smell. ?If it blisters and opens or develops a yellow-brown crust. ?You develop a fever. ?You have a sore throat. ?You become short of breath. ? ?MAKE SURE YOU: ? ?Understand these instructions. ?Will watch your condition. ?Will get help right away if you are not doing well or get worse. ? ?Thank you for choosing an e-visit. ? ?Your e-visit answers were reviewed by a board certified advanced clinical practitioner to complete your personal care plan. Depending upon the condition, your plan could have included both over the counter or prescription medications. ? ?Please review your pharmacy choice. Make sure the pharmacy is open so you can pick up prescription now. If there is a problem, you may contact your provider through [employer] and have the prescription routed to another pharmacy.  Your safety is important to [place]. If you have drug allergies check  your prescription carefully.  ? ?For the next 24 hours you can use MyChart to ask questions about today's visit, request a non-urgent call back, or ask for a work or school excuse. ?You will get an email in the next two days asking about your experience. I hope that your e-visit has been valuable and will speed your recovery.  ? ? have provided 5 minutes of non face to face time during this encounter for chart review and documentation.   ?

## 2023-02-28 ENCOUNTER — Telehealth: Payer: Self-pay | Admitting: Internal Medicine

## 2023-02-28 ENCOUNTER — Encounter: Payer: Federal, State, Local not specified - PPO | Admitting: Internal Medicine

## 2023-02-28 ENCOUNTER — Ambulatory Visit: Payer: Self-pay | Admitting: *Deleted

## 2023-02-28 ENCOUNTER — Encounter: Payer: Self-pay | Admitting: Internal Medicine

## 2023-02-28 DIAGNOSIS — G43109 Migraine with aura, not intractable, without status migrainosus: Secondary | ICD-10-CM

## 2023-02-28 NOTE — Telephone Encounter (Signed)
Pt sent a My Chart message.  I responded to her on My Chart.    Thanks,   -Vernona Rieger

## 2023-02-28 NOTE — Telephone Encounter (Signed)
error 

## 2023-02-28 NOTE — Telephone Encounter (Signed)
Unfortunately she has canceled and no showed quite a few recent appointment with me.  She will have to be seen before medications can be refilled.

## 2023-02-28 NOTE — Telephone Encounter (Addendum)
  Chief Complaint: migraine with vomiting. Requesting refills on medication imitrex Symptoms: headache with vomiting, since working in [place] and getting too hot. Now eyes draining, runny nose sneezing cough. Feels "hot' sweaty. Anxious , like she could have panic attack. Reported SOB at times.  Frequency: today  Pertinent Negatives: Patient denies chest pain no difficulty breathing no fever  Disposition: [] ED /[] Urgent Care (no appt availability in office) / [] Appointment(In office/virtual)/ []  [facility]/ [] Home Care/ [x] Refused Recommended Disposition /[] [facility]/ []  Follow-up with PCP Additional Notes:   Requesting appt this week due to going out of town for work next week. Recommended patient do covid testing for multiple sx due to being out of town .  Recommended UC  and patient would like to see PCP instead. Patient attempting to get refills for zofran now and requesting refill for imitrex and xanax. Patient took excederin and helped headache ease off but now headache back and worse than before. Please advise if patient can been seen in OV. Please advise   Patient called back 1:30 pm to report [facility]. Checked BP for 147/104. Took BP med this am and took another amlodipine 5 mg a few minutes ago . Reports BP with wrist monitor now 140/93 she is now in bed with lights off and cool pack on her for headache. Please advise regarding appt with PCP.  Patient wants to see PCP instead of another provider due to sx . Advise UC again due to no appt with any provider until 03/04/23.   Reason for Disposition  [1] Vomiting AND [2] 2 or more times  (Exception: Similar to previous migraines.)  Answer Assessment - Initial Assessment Questions 1. LOCATION: "Where does it hurt?"      Headache  2. ONSET: "When did the headache start?" (Minutes, hours or days)      After coming home from airport  3. PATTERN: "Does the pain come and go,  or has it been constant since it started?"    Feels like migraine from getting too hot  4. SEVERITY: "How bad is the pain?" and "What does it keep you from doing?"  (e.g., Scale 1-10; mild, moderate, or severe)   - MILD (1-3): doesn't interfere with normal activities    - MODERATE (4-7): interferes with normal activities or awakens from sleep    - SEVERE (8-10): excruciating pain, unable to do any normal activities        Causing vomiting  5. RECURRENT SYMPTOM: "Have you ever had headaches before?" If Yes, ask: "When was the last time?" and "What happened that time?"      Yes  6. CAUSE: "What do you think is causing the headache?"     Not sure  7. MIGRAINE: "Have you been diagnosed with migraine headaches?" If Yes, ask: "Is this headache similar?"      yes 8. HEAD INJURY: "Has there been any recent injury to the head?"      na 9. OTHER SYMPTOMS: "Do you have any other symptoms?" (fever, stiff neck, eye pain, sore throat, cold symptoms)     Headache, eyes draining  and runny nose cough sneezing  10. PREGNANCY: "Is there any chance you are pregnant?" "When was your last menstrual period?"       na  Protocols used: [facility]

## 2023-03-01 MED ORDER — SUMATRIPTAN SUCCINATE 100 MG PO TABS
100.0000 mg | ORAL_TABLET | ORAL | 0 refills | Status: DC | PRN
Start: 2023-03-01 — End: 2023-04-18

## 2023-03-01 NOTE — Addendum Note (Signed)
Addended by: Lorre Munroe on: 03/01/2023 12:53 PM   Modules accepted: Orders

## 2023-03-02 ENCOUNTER — Encounter: Payer: Self-pay | Admitting: Internal Medicine

## 2023-03-02 ENCOUNTER — Other Ambulatory Visit: Payer: Self-pay | Admitting: Internal Medicine

## 2023-03-02 ENCOUNTER — Ambulatory Visit (INDEPENDENT_AMBULATORY_CARE_PROVIDER_SITE_OTHER): Payer: Federal, State, Local not specified - PPO | Admitting: Internal Medicine

## 2023-03-02 VITALS — BP 124/84 | HR 82 | Temp 96.4°F | Ht 69.0 in | Wt 188.0 lb

## 2023-03-02 DIAGNOSIS — M25462 Effusion, left knee: Secondary | ICD-10-CM

## 2023-03-02 DIAGNOSIS — Z0001 Encounter for general adult medical examination with abnormal findings: Secondary | ICD-10-CM | POA: Diagnosis not present

## 2023-03-02 DIAGNOSIS — E663 Overweight: Secondary | ICD-10-CM

## 2023-03-02 DIAGNOSIS — M25562 Pain in left knee: Secondary | ICD-10-CM

## 2023-03-02 DIAGNOSIS — R7309 Other abnormal glucose: Secondary | ICD-10-CM

## 2023-03-02 DIAGNOSIS — Z6828 Body mass index (BMI) 28.0-28.9, adult: Secondary | ICD-10-CM

## 2023-03-02 DIAGNOSIS — E782 Mixed hyperlipidemia: Secondary | ICD-10-CM

## 2023-03-02 DIAGNOSIS — J069 Acute upper respiratory infection, unspecified: Secondary | ICD-10-CM | POA: Diagnosis not present

## 2023-03-02 DIAGNOSIS — Z1231 Encounter for screening mammogram for malignant neoplasm of breast: Secondary | ICD-10-CM

## 2023-03-02 DIAGNOSIS — Z1211 Encounter for screening for malignant neoplasm of colon: Secondary | ICD-10-CM

## 2023-03-02 MED ORDER — METHYLPREDNISOLONE ACETATE 80 MG/ML IJ SUSP
80.0000 mg | Freq: Once | INTRAMUSCULAR | Status: AC
Start: 2023-03-02 — End: 2023-03-02
  Administered 2023-03-02: 80 mg via INTRAMUSCULAR

## 2023-03-02 NOTE — Patient Instructions (Signed)

## 2023-03-02 NOTE — Addendum Note (Signed)
Addended by: Lorre Munroe on: 03/02/2023 08:38 AM   Modules accepted: Level of Service

## 2023-03-02 NOTE — Progress Notes (Addendum)
Subjective:    Patient ID: Linda [name], female    DOB: 1975/12/06, 47 y.o.   MRN: [id number]  HPI  Patient presents the clinic today for her annual exam.  Flu: 04/2021 Tetanus: 08/2015 COVID: x 3 Pap smear: Hysterectomy Mammogram: 04/2019 Colon screening: Never Vision screening: as needed Dentist: annually  Diet: She does eat meat. She consumes fruits and veggies. She tries to avoid fried foods. She drinks mostly water. Exercise: Walking  Review of Systems     Past Medical History:  Diagnosis Date   Alcohol abuse    Anxiety    Asthma    GERD (gastroesophageal reflux disease)    Heart murmur    while pregnant   Hypertension    Migraine     Current Outpatient Medications  Medication Sig Dispense Refill   albuterol (VENTOLIN HFA) 108 (90 Base) MCG/ACT inhaler Inhale 2 puffs into the lungs every 6 (six) hours as needed for wheezing or shortness of breath. 18 g 0   ALPRAZolam (XANAX) 0.5 MG tablet TAKE 1 TABLET BY MOUTH DAILY AS NEEDED. PLEASE SCHEDULE AN OFFICE VISIT BEFORE ANYMORE REFILLS. 20 tablet 0   amLODipine (NORVASC) 5 MG tablet TAKE 1 TABLET (5 MG TOTAL) BY MOUTH DAILY. 90 tablet 0   amphetamine-dextroamphetamine (ADDERALL XR) 20 MG 24 hr capsule Take 1 capsule (20 mg total) by mouth every morning. 30 capsule 0   doxycycline (VIBRAMYCIN) 100 MG capsule Take 1 capsule (100 mg total) by mouth 2 (two) times daily. 20 capsule 0   esomeprazole (NEXIUM) 40 MG capsule Take 1 capsule (40 mg total) by mouth daily. 90 capsule 1   fluconazole (DIFLUCAN) 150 MG tablet Take 1 tablet (150 mg total) by mouth every 3 (three) days as needed. 3 tablet 0   gabapentin (NEURONTIN) 300 MG capsule Take 1 capsule (300 mg total) by mouth 2 (two) times daily. 30 capsule 0   naproxen (NAPROSYN) 500 MG tablet Take 1 tablet (500 mg total) by mouth 2 (two) times daily. 30 tablet 0   ondansetron (ZOFRAN-ODT) 4 MG disintegrating tablet Take 1 tablet (4 mg total) by mouth every 8 (eight)  hours as needed for nausea or vomiting. 40 tablet 0   predniSONE (STERAPRED UNI-PAK 21 TAB) 10 MG (21) TBPK tablet 10 mg dose pack- 6 day to take as directed. 1 each 0   SUMAtriptan (IMITREX) 100 MG tablet Take 1 tablet (100 mg total) by mouth every 2 (two) hours as needed for migraine. May repeat in 2 hours if headache persists or recurs. 10 tablet 0   No current facility-administered medications for this visit.    No Active Allergies  Family History  Problem Relation Age of Onset   Hypertension Mother    Uterine cancer Mother    Stroke Mother    Heart attack Mother    Hypertension Father    Diabetes Father    Multiple sclerosis Father    Cancer Maternal Grandmother        PANCREATIC   Hypertension Maternal Grandmother    Diabetes Maternal Grandmother    Cancer Maternal Grandfather        LUNG   Hypertension Maternal Grandfather    Diabetes Maternal Grandfather    Cancer Paternal Grandmother        THROAT   Alzheimer's disease Paternal Grandmother    Diabetes Maternal Aunt    Diabetes Maternal Uncle     Social History   Socioeconomic History   Marital status: Divorced  Spouse name: Not on file   Number of children: Not on file   Years of education: Not on file   Highest education level: Master's degree (e.g., MA, MS, MEng, MEd, MSW, MBA)  Occupational History   Not on file  Tobacco Use   Smoking status: Never   Smokeless tobacco: Never  Vaping Use   Vaping status: Never Used  Substance and Sexual Activity   Alcohol use: Yes    Comment: occ   Drug use: No   Sexual activity: Not on file  Other Topics Concern   Not on file  Social History Narrative   Not on file   Social Determinants of Health   Financial Resource Strain: Low Risk  (02/28/2023)   Overall Financial Resource Strain (CARDIA)    Difficulty of Paying Living Expenses: Not hard at all  Food Insecurity: No Food Insecurity (02/28/2023)   Hunger Vital Sign    Worried About Running Out of Food in  the Last Year: Never true    Ran Out of Food in the Last Year: Never true  Transportation Needs: No Transportation Needs (02/28/2023)   PRAPARE - Administrator, Civil Service (Medical): No    Lack of Transportation (Non-Medical): No  Physical Activity: Insufficiently Active (02/28/2023)   Exercise Vital Sign    Days of Exercise per Week: 3 days    Minutes of Exercise per Session: 20 min  Stress: Stress Concern Present (02/28/2023)   [employer] of Occupational Health - Occupational Stress Questionnaire    Feeling of Stress : To some extent  Social Connections: Moderately Integrated (02/28/2023)   Social Connection and Isolation Panel [NHANES]    Frequency of Communication with Friends and Family: More than three times a week    Frequency of Social Gatherings with Friends and Family: Once a week    Attends Religious Services: More than 4 times per year    Active Member of [employer] or Organizations: Yes    Attends Banker Meetings: More than 4 times per year    Marital Status: Divorced  Intimate Partner Violence: Not At Risk (03/16/2022)   Humiliation, Afraid, Rape, and Kick questionnaire    Fear of Current or Ex-Partner: No    Emotionally Abused: No    Physically Abused: No    Sexually Abused: No     Constitutional: Patient reports intermittent headaches.  Denies fever, malaise, fatigue, or abrupt weight changes.  HEENT: Pt reports nasal congestion, sneezing. Denies eye pain, eye redness, ear pain, ringing in the ears, wax buildup, runny nose, bloody nose, or sore throat. Respiratory: Denies difficulty breathing, shortness of breath, cough or sputum production.   Cardiovascular: Denies chest pain, chest tightness, palpitations or swelling in the hands or feet.  Gastrointestinal: Denies abdominal pain, bloating, constipation, diarrhea or blood in the stool.  GU: Denies urgency, frequency, pain with urination, burning sensation, blood in urine, odor or  discharge. Musculoskeletal: Patient reports left knee pain and swelling.  Denies decrease in range of motion, difficulty with gait, muscle pain.  Skin: Denies redness, rashes, lesions or ulcercations.  Neurological: Patient reports intention.  Denies dizziness, difficulty with memory, difficulty with speech or problems with balance and coordination.  Psych: Patient has a history of anxiety and depression.  Denies SI/HI.  No other specific complaints in a complete review of systems (except as listed in HPI above).  Objective:   Physical Exam  BP 124/84 (BP Location: Right Arm, Patient Position: Sitting, Cuff Size: Large)  Pulse 82   Temp (!) 96.4 F (35.8 C) (Temporal)   Ht 5\' 9"  (1.753 m)   Wt 188 lb (85.3 kg)   LMP 07/12/2011   SpO2 99%   BMI 27.76 kg/m   Wt Readings from Last 3 Encounters:  01/23/23 190 lb (86.2 kg)  08/11/22 190 lb (86.2 kg)  06/28/22 190 lb (86.2 kg)    General: Appears her stated age, overweight, in NAD. Skin: Warm, dry and intact.  Healed abrasion over left patella. HEENT: Head: normal shape and size; Eyes: sclera white, no icterus, conjunctiva pink, PERRLA and EOMs intact;  Neck:  Neck supple, trachea midline. No masses, lumps or thyromegaly present.  Cardiovascular: Normal rate and rhythm. S1,S2 noted.  No murmur, rubs or gallops noted. No JVD or BLE edema.  Pulmonary/Chest: Normal effort and positive vesicular breath sounds. No respiratory distress. No wheezes, rales or ronchi noted.  Abdomen: Normal bowel sounds.  Musculoskeletal: She has difficulty with full extension of the left knee.  Normal flexion of the left knee.  Pain with palpation of the inferior patellar tendon.  Swelling noted on the lateral side of the left knee.  Strength 5/5 BUE/BLE. No difficulty with gait.  Neurological: Alert and oriented. Cranial nerves II-XII grossly intact. Coordination normal.  Psychiatric: Mood and affect normal. Behavior is normal. Judgment and thought content  normal.    BMET    Component Value Date/Time   NA 135 01/23/2023 1617   NA 137 05/31/2013 0846   K 3.7 01/23/2023 1617   K 3.8 05/31/2013 0846   CL 102 01/23/2023 1617   CL 106 05/31/2013 0846   CO2 24 01/23/2023 1617   CO2 26 05/31/2013 0846   GLUCOSE 106 (H) 01/23/2023 1617   GLUCOSE 93 05/31/2013 0846   BUN 14 01/23/2023 1617   BUN 10 05/31/2013 0846   CREATININE 0.76 01/23/2023 1617   CREATININE 0.73 08/11/2022 0839   CALCIUM 8.8 (L) 01/23/2023 1617   CALCIUM 8.2 (L) 05/31/2013 0846   GFRNONAA >60 01/23/2023 1617   GFRNONAA >60 05/31/2013 0846   GFRAA >60 02/08/2020 0600   GFRAA >60 05/31/2013 0846    Lipid Panel     Component Value Date/Time   CHOL 226 (H) 08/11/2022 0839   TRIG 118 08/11/2022 0839   HDL 103 08/11/2022 0839   CHOLHDL 2.2 08/11/2022 0839   VLDL 35.2 05/17/2019 1606   LDLCALC 102 (H) 08/11/2022 0839    CBC    Component Value Date/Time   WBC 4.6 01/23/2023 1617   RBC 4.48 01/23/2023 1617   HGB 14.3 01/23/2023 1617   HGB 13.3 05/31/2013 0846   HCT 42.5 01/23/2023 1617   HCT 39.2 05/31/2013 0846   PLT 180 01/23/2023 1617   PLT 219 05/31/2013 0846   MCV 94.9 01/23/2023 1617   MCV 88 05/31/2013 0846   MCH 31.9 01/23/2023 1617   MCHC 33.6 01/23/2023 1617   RDW 12.6 01/23/2023 1617   RDW 12.8 05/31/2013 0846   LYMPHSABS 1.2 01/23/2023 1617   LYMPHSABS 2.1 05/31/2013 0846   MONOABS 0.3 01/23/2023 1617   MONOABS 0.4 05/31/2013 0846   EOSABS 0.1 01/23/2023 1617   EOSABS 0.1 05/31/2013 0846   BASOSABS 0.1 01/23/2023 1617   BASOSABS 0.1 05/31/2013 0846    Hgb A1C Lab Results  Component Value Date   HGBA1C 5.6 08/11/2022            Assessment & Plan:   Preventative health maintenance:  Encouraged her to get a flu  shot in the fall Tetanus UTD Encouraged her to get her COVID booster She no longer needs to screen for cervical cancer Mammogram ordered-she will call to schedule Referral to GI for screening  colonoscopy Encouraged her to consume a balanced diet and exercise regimen Advised her to see an eye doctor and dentist annually We will check CBC, c-Met, lipid, A1c today  Left knee pain and swelling:  She has already had multiple x-rays urgent care and seeing [facility] for the same Recommend she make a follow-up appointment with [facility] to request MRI versus PT  Viral URI:  80 mg Depo-Medrol IM x 1 Recommend Zyrtec and Flonase OTC  RTC in 6 months, follow-up chronic conditions Nicki Reaper, NP

## 2023-03-02 NOTE — Assessment & Plan Note (Signed)
Encouraged diet and exercise for weight loss ?

## 2023-03-02 NOTE — Addendum Note (Signed)
Addended by: Paschal Dopp on: 03/02/2023 08:42 AM   Modules accepted: Orders

## 2023-03-03 ENCOUNTER — Other Ambulatory Visit: Payer: Self-pay | Admitting: Internal Medicine

## 2023-03-03 ENCOUNTER — Encounter: Payer: Self-pay | Admitting: Internal Medicine

## 2023-03-03 LAB — COMPLETE METABOLIC PANEL WITH GFR
AG Ratio: 1.7 (calc) (ref 1.0–2.5)
ALT: 23 U/L (ref 6–29)
AST: 24 U/L (ref 10–35)
Albumin: 4.3 g/dL (ref 3.6–5.1)
Alkaline phosphatase (APISO): 83 U/L (ref 31–125)
BUN: 10 mg/dL (ref 7–25)
CO2: 25 mmol/L (ref 20–32)
Calcium: 8.9 mg/dL (ref 8.6–10.2)
Chloride: 106 mmol/L (ref 98–110)
Creat: 0.82 mg/dL (ref 0.50–0.99)
Globulin: 2.5 g/dL (ref 1.9–3.7)
Glucose, Bld: 79 mg/dL (ref 65–99)
Potassium: 4 mmol/L (ref 3.5–5.3)
Sodium: 140 mmol/L (ref 135–146)
Total Bilirubin: 0.3 mg/dL (ref 0.2–1.2)
Total Protein: 6.8 g/dL (ref 6.1–8.1)
eGFR: 90 mL/min/{1.73_m2} (ref >=60)

## 2023-03-03 LAB — LIPID PANEL
Cholesterol: 208 mg/dL — ABNORMAL HIGH (ref <200)
HDL: 94 mg/dL (ref >=50)
LDL Cholesterol (Calc): 90 mg/dL
Non-HDL Cholesterol (Calc): 114 mg/dL (ref <130)
Total CHOL/HDL Ratio: 2.2 (calc) (ref <5.0)
Triglycerides: 141 mg/dL (ref <150)

## 2023-03-03 LAB — CBC
HCT: 41.6 % (ref 35.0–45.0)
Hemoglobin: 14 g/dL (ref 11.7–15.5)
MCH: 32.2 pg (ref 27.0–33.0)
MCHC: 33.7 g/dL (ref 32.0–36.0)
MCV: 95.6 fL (ref 80.0–100.0)
MPV: 10.2 fL (ref 7.5–12.5)
Platelets: 286 10*3/uL (ref 140–400)
RBC: 4.35 10*6/uL (ref 3.80–5.10)
RDW: 12.9 % (ref 11.0–15.0)
WBC: 6.5 10*3/uL (ref 3.8–10.8)

## 2023-03-03 LAB — HEMOGLOBIN A1C
Hgb A1c MFr Bld: 5.6 %{Hb} (ref <5.7)
Mean Plasma Glucose: 114 mg/dL
eAG (mmol/L): 6.3 mmol/L

## 2023-03-03 MED ORDER — ALPRAZOLAM 0.5 MG PO TABS: 0.5000 mg | ORAL_TABLET | Freq: Every day | ORAL | 0 refills | Status: DC | PRN

## 2023-03-03 MED ORDER — AMPHETAMINE-DEXTROAMPHET ER 20 MG PO CP24: 20.0000 mg | ORAL_CAPSULE | ORAL | 0 refills | Status: DC

## 2023-03-03 NOTE — Telephone Encounter (Signed)
Requested medications are due for refill today.  no  Requested medications are on the active medications list.  yes  Last refill. 03/03/2023 #20 0 rf  Future visit scheduled.   yes  Notes to clinic.  Refill/refusal not delegated.    Requested Prescriptions  Pending Prescriptions Disp Refills   ALPRAZolam (XANAX) 0.5 MG tablet [Pharmacy Med Name: ALPRAZOLAM 0.5 MG TABLET] 20 tablet 0    Sig: TAKE 1 TABLET DAILY AS NEEDED. PLEASE SCHEDULE AN OFFICE VISIT BEFORE ANYMORE REFILLS. 3/31     Not Delegated - Psychiatry: Anxiolytics/Hypnotics 2 Failed - 03/03/2023  7:08 AM      Failed - This refill cannot be delegated      Failed - Urine Drug Screen completed in last 360 days      Passed - Patient is not pregnant      Passed - Valid encounter within last 6 months    Recent Outpatient Visits           Yesterday Encounter for general adult medical examination with abnormal findings   [facility] [place], Salvadore Oxford, NP   6 months ago Attention deficit hyperactivity disorder (ADHD), predominantly inattentive type   [place] [facility] [place], Salvadore Oxford, NP   1 year ago Encounter for general adult medical examination with abnormal findings   [place] [facility] [place], Salvadore Oxford, NP   1 year ago Left-sided chest pain   [place] [facility] [place], Salvadore Oxford, NP   2 years ago Encounter for general adult medical examination with abnormal findings   [place] [place], Salvadore Oxford, NP       Future Appointments             In 6 months [place], Salvadore Oxford, NP  [facility]

## 2023-03-08 ENCOUNTER — Other Ambulatory Visit: Payer: Self-pay | Admitting: Internal Medicine

## 2023-03-08 NOTE — Telephone Encounter (Unsigned)
Copied from CRM [id number]. Topic: General - Other >> Mar 08, 2023  2:37 PM Everette C wrote: Reason for CRM: Medication Refill - Medication: ALPRAZolam (XANAX) 0.5 MG tablet [[id number]]  amphetamine-dextroamphetamine (ADDERALL XR) 20 MG 24 hr capsule [[id number]]  Has the patient contacted their pharmacy? Yes.   (Agent: If no, request that the patient contact the pharmacy for the refill. If patient does not wish to contact the pharmacy document the reason why and proceed with request.) (Agent: If yes, when and what did the pharmacy advise?)  Preferred Pharmacy (with phone number or street name): [id number] [place], [place] - [address] [place] [address] Phone: [phone] Fax: [phone] Hours: Not open 24 hours  Has the patient been seen for an appointment in the last year OR does the patient have an upcoming appointment? Yes.    Agent: Please be advised that RX refills may take up to 3 business days. We ask that you follow-up with your pharmacy.  The patient shares that they would like for their medications to be submitted to this pharmacy   Please contact further when possible, the patient is uncertain of why their pharmacy has told them their medications were picked up already and they are currently out of town in [place]

## 2023-03-09 NOTE — Telephone Encounter (Signed)
Requested medication (s) are due for refill today: routing for review  Requested medication (s) are on the active medication list: yes  Last refill:  03/03/23  Future visit scheduled: yes  Notes to clinic:  Unable to refill per protocol, cannot delegate.      Requested Prescriptions  Pending Prescriptions Disp Refills   ALPRAZolam (XANAX) 0.5 MG tablet 20 tablet 0    Sig: Take 1 tablet (0.5 mg total) by mouth daily as needed for anxiety.     Not Delegated - Psychiatry: Anxiolytics/Hypnotics 2 Failed - 03/08/2023  5:22 PM      Failed - This refill cannot be delegated      Failed - Urine Drug Screen completed in last 360 days      Passed - Patient is not pregnant      Passed - Valid encounter within last 6 months    Recent Outpatient Visits           1 week ago Encounter for general adult medical examination with abnormal findings   [place] [facility] [place], Salvadore Oxford, NP   7 months ago Attention deficit hyperactivity disorder (ADHD), predominantly inattentive type   [place] [facility] [place], Salvadore Oxford, NP   1 year ago Encounter for general adult medical examination with abnormal findings   [place] [facility] [place], Salvadore Oxford, NP   1 year ago Left-sided chest pain   [place] [place], Salvadore Oxford, NP   2 years ago Encounter for general adult medical examination with abnormal findings   [place] [facility] [place], Salvadore Oxford, NP       Future Appointments             In 6 months Baity, Salvadore Oxford, NP [place] [facility]             amphetamine-dextroamphetamine (ADDERALL XR) 20 MG 24 hr capsule 30 capsule 0    Sig: Take 1 capsule (20 mg total) by mouth every morning.     Not Delegated - Psychiatry:  Stimulants/ADHD Failed - 03/08/2023  5:22 PM      Failed - This refill cannot be delegated      Failed - Urine Drug Screen completed in last 360  days      Passed - Last BP in normal range    BP Readings from Last 1 Encounters:  03/02/23 124/84         Passed - Last Heart Rate in normal range    Pulse Readings from Last 1 Encounters:  03/02/23 82         Passed - Valid encounter within last 6 months    Recent Outpatient Visits           1 week ago Encounter for general adult medical examination with abnormal findings   [place] [facility] [place], Salvadore Oxford, NP   7 months ago Attention deficit hyperactivity disorder (ADHD), predominantly inattentive type   [place] [facility] [place], Salvadore Oxford, NP   1 year ago Encounter for general adult medical examination with abnormal findings   [place] [facility] [place], Salvadore Oxford, NP   1 year ago Left-sided chest pain   [place] [facility] [place], Salvadore Oxford, NP   2 years ago Encounter for general adult medical examination with abnormal findings   [facility] [place], Salvadore Oxford, NP       Future Appointments             In 6 months Baity, Salvadore Oxford, NP [place] [facility], [place]

## 2023-03-10 ENCOUNTER — Encounter: Payer: Federal, State, Local not specified - PPO | Admitting: Internal Medicine

## 2023-03-15 ENCOUNTER — Encounter: Payer: Federal, State, Local not specified - PPO | Admitting: Internal Medicine

## 2023-03-18 ENCOUNTER — Encounter: Payer: Self-pay | Admitting: Internal Medicine

## 2023-03-18 DIAGNOSIS — R42 Dizziness and giddiness: Secondary | ICD-10-CM | POA: Diagnosis not present

## 2023-03-18 DIAGNOSIS — R000 Tachycardia, unspecified: Secondary | ICD-10-CM | POA: Diagnosis not present

## 2023-03-18 DIAGNOSIS — I10 Essential (primary) hypertension: Secondary | ICD-10-CM | POA: Diagnosis not present

## 2023-03-18 DIAGNOSIS — R55 Syncope and collapse: Secondary | ICD-10-CM | POA: Diagnosis not present

## 2023-03-22 ENCOUNTER — Ambulatory Visit: Payer: Self-pay

## 2023-03-22 NOTE — Telephone Encounter (Signed)
Chief Complaint: Took zomig and imitrex for migraine Symptoms: Chest tightness, feeling anxious Frequency: Happened today Pertinent Negatives: Patient denies other symptoms Disposition: [x] ED /[] Urgent Care (no appt availability in office) / [] Appointment(In office/virtual)/ []  [facility]/ [] Home Care/ [] Refused Recommended Disposition /[] [facility]/ []  Follow-up with PCP Additional Notes: Patient says she took zomig this morning at 0800 and wasn't aware it was zomig until she took the imitrex at 1500 and realized the pills looked different. She says she went and took the box out the trash and realized what she had done. She says she's not supposed to take both and she's nervous about it, not knowing what to do. She says her chest is feeling tight, but it could be anxiety. Advised due to the chest tightness as a side effect of taking them both together to go to the ED. She asked should she wait 30 minutes to see if it will get better. Advised it's up to her, but my advice is to go so they can place her on the heart monitor to monitor her for a while. She says she will get dressed and go.   Reason for Disposition  [1] Caller has URGENT medicine question about med that PCP or specialist prescribed AND [2] triager unable to answer question  Answer Assessment - Initial Assessment Questions 1. NAME of MEDICINE: "What medicine(s) are you calling about?"     Took Zomig at 0800, sumitrex taken at 1500 2. QUESTION: "What is your question?" (e.g., double dose of medicine, side effect)     Not supposed to take them both 3. PRESCRIBER: "Who prescribed the medicine?" Reason: if prescribed by specialist, call should be referred to that group.     Regina 4. SYMPTOMS: "Do you have any symptoms?" If Yes, ask: "What symptoms are you having?"  "How bad are the symptoms (e.g., mild, moderate, severe)     Chest tightness  Protocols used: Medication Question Call-A-AH

## 2023-03-23 ENCOUNTER — Telehealth: Payer: Federal, State, Local not specified - PPO | Admitting: Physician Assistant

## 2023-03-23 DIAGNOSIS — T3695XA Adverse effect of unspecified systemic antibiotic, initial encounter: Secondary | ICD-10-CM | POA: Diagnosis not present

## 2023-03-23 DIAGNOSIS — B379 Candidiasis, unspecified: Secondary | ICD-10-CM

## 2023-03-23 MED ORDER — FLUCONAZOLE 150 MG PO TABS
150.0000 mg | ORAL_TABLET | Freq: Every day | ORAL | 0 refills | Status: DC
Start: 2023-03-23 — End: 2023-03-29

## 2023-03-23 NOTE — Telephone Encounter (Signed)
noted 

## 2023-03-23 NOTE — Progress Notes (Signed)

## 2023-03-23 NOTE — Progress Notes (Signed)
I have spent 5 minutes in review of e-visit questionnaire, review and updating patient chart, medical decision making and response to patient.   William Cody Martin, PA-C    

## 2023-03-29 ENCOUNTER — Encounter: Payer: Self-pay | Admitting: Internal Medicine

## 2023-03-29 MED ORDER — FLUCONAZOLE 150 MG PO TABS: 150.0000 mg | ORAL_TABLET | Freq: Once | ORAL | 0 refills | Status: AC

## 2023-03-30 ENCOUNTER — Other Ambulatory Visit: Payer: Self-pay | Admitting: Internal Medicine

## 2023-03-30 ENCOUNTER — Ambulatory Visit: Payer: Federal, State, Local not specified - PPO

## 2023-03-31 MED ORDER — ALPRAZOLAM 0.5 MG PO TABS: 0.5000 mg | ORAL_TABLET | Freq: Every day | ORAL | 0 refills | Status: DC | PRN

## 2023-03-31 NOTE — Telephone Encounter (Signed)
Requested medication (s) are due for refill today: Yes  Requested medication (s) are on the active medication list: Yes  Last refill:  03/31/23  Future visit scheduled: Yes  Notes to clinic:  Unable to refill per protocol, cannot delegate. Rx was refilled today     Requested Prescriptions  Pending Prescriptions Disp Refills   ALPRAZolam (XANAX) 0.5 MG tablet [Pharmacy Med Name: ALPRAZOLAM 0.5 MG TABLET] 20 tablet 0    Sig: TAKE 1 TABLET BY MOUTH EVERY DAY AS NEEDED FOR ANXIETY     Not Delegated - Psychiatry: Anxiolytics/Hypnotics 2 Failed - 03/30/2023 12:12 PM      Failed - This refill cannot be delegated      Failed - Urine Drug Screen completed in last 360 days      Passed - Patient is not pregnant      Passed - Valid encounter within last 6 months    Recent Outpatient Visits           4 weeks ago Encounter for general adult medical examination with abnormal findings   [place] [facility] [place], Salvadore Oxford, NP   7 months ago Attention deficit hyperactivity disorder (ADHD), predominantly inattentive type   [place] [facility] [place], Salvadore Oxford, NP   1 year ago Encounter for general adult medical examination with abnormal findings   [place] [facility] [place], Salvadore Oxford, NP   1 year ago Left-sided chest pain   [place] [facility] [place], Salvadore Oxford, NP   2 years ago Encounter for general adult medical examination with abnormal findings   [place] [facility] [place], Salvadore Oxford, NP       Future Appointments             In 5 months Baity, Salvadore Oxford, NP  [facility], [place] [facility]

## 2023-04-05 ENCOUNTER — Ambulatory Visit
Admission: RE | Admit: 2023-04-05 | Discharge: 2023-04-05 | Disposition: A | Discharge status: Home / Self Care | Payer: Federal, State, Local not specified - PPO | Source: Ambulatory Visit | Attending: Internal Medicine | Admitting: Internal Medicine

## 2023-04-05 DIAGNOSIS — Z1231 Encounter for screening mammogram for malignant neoplasm of breast: Secondary | ICD-10-CM | POA: Diagnosis not present

## 2023-04-07 ENCOUNTER — Other Ambulatory Visit: Payer: Self-pay | Admitting: Internal Medicine

## 2023-04-07 MED ORDER — AMPHETAMINE-DEXTROAMPHET ER 20 MG PO CP24: 20.0000 mg | ORAL_CAPSULE | ORAL | 0 refills | Status: DC

## 2023-04-08 ENCOUNTER — Telehealth: Payer: Federal, State, Local not specified - PPO | Admitting: Family Medicine

## 2023-04-08 ENCOUNTER — Encounter: Payer: Self-pay | Admitting: Internal Medicine

## 2023-04-08 ENCOUNTER — Other Ambulatory Visit: Payer: Self-pay | Admitting: Internal Medicine

## 2023-04-08 DIAGNOSIS — R21 Rash and other nonspecific skin eruption: Secondary | ICD-10-CM

## 2023-04-08 NOTE — Progress Notes (Signed)
Because Linda [name], I feel your condition warrants further evaluation and I recommend that you be seen in a face to face visit.   NOTE: There will be NO CHARGE for this eVisit   If you are having a true medical emergency please call 911.      For an urgent face to face visit, [facility] has eight urgent care centers for your convenience:   NEW!! [facility] Health Urgent Care Center at [facility] Get Driving Directions [phone] [address] [place], [address]    [facility] Health Urgent Care Center at [facility] Get Driving Directions [phone] [address] [place], [place] [address]   [facility] Health Urgent Care Center [facility]) Get Driving Directions [phone] [address] [place], [place] [address]  [facility] Health [facility] Care Center [facility]) Get Driving Directions [phone] [address] [place],  [place]  [address]  [facility] Health Urgent Care Center [facility] - at [employer]  [phone] [address] [place],  [place] [address]   [facility] Health Urgent Care at [facility] Get Driving Directions [phone] [address] [place] [address] [place], [place] [address]   [facility] Health Urgent Care at [facility] Get Driving Directions  [phone] [address] [place], [place] [address]   [facility] Health Urgent Care at [facility] Directions [phone] [address] [place], [place] [address]  Your MyChart E-visit questionnaire answers were reviewed by a board certified advanced clinical practitioner to complete your personal care plan based on your specific symptoms.  Thank you for using e-Visits.      have provided 5 minutes of non face to face time during this encounter for chart review and documentation.

## 2023-04-08 NOTE — Telephone Encounter (Signed)
Requested medications are due for refill today.  Unsure  Requested medications are on the active medications list.  yes  Last refill. 03/31/2023 #20 0 rf  Future visit scheduled.   yes  Notes to clinic.  Medication refilled 03/31/2023 Confirmed by pharmacy. Medication refill/refusal not delegated.    Requested Prescriptions  Pending Prescriptions Disp Refills   ALPRAZolam (XANAX) 0.5 MG tablet [Pharmacy Med Name: ALPRAZOLAM 0.5 MG TABLET] 20 tablet 0    Sig: TAKE 1 TABLET BY MOUTH EVERY DAY AS NEEDED FOR ANXIETY     Not Delegated - Psychiatry: Anxiolytics/Hypnotics 2 Failed - 04/08/2023  5:01 PM      Failed - This refill cannot be delegated      Failed - Urine Drug Screen completed in last 360 days      Passed - Patient is not pregnant      Passed - Valid encounter within last 6 months    Recent Outpatient Visits           1 month ago Encounter for general adult medical examination with abnormal findings   [place] [facility] [place], Salvadore Oxford, NP   8 months ago Attention deficit hyperactivity disorder (ADHD), predominantly inattentive type   [place] [facility] [place], Salvadore Oxford, NP   1 year ago Encounter for general adult medical examination with abnormal findings   [place] [facility] [place], Salvadore Oxford, NP   1 year ago Left-sided chest pain   [place] [facility] [place], Salvadore Oxford, NP   2 years ago Encounter for general adult medical examination with abnormal findings   [place] [facility] [place], Salvadore Oxford, NP       Future Appointments             In 5 months Baity, Salvadore Oxford, NP [place] [facility], [facility]

## 2023-04-11 MED ORDER — AMPHETAMINE-DEXTROAMPHET ER 20 MG PO CP24: 20.0000 mg | ORAL_CAPSULE | ORAL | 0 refills | Status: DC

## 2023-04-18 ENCOUNTER — Other Ambulatory Visit: Payer: Self-pay | Admitting: Internal Medicine

## 2023-04-18 DIAGNOSIS — G43109 Migraine with aura, not intractable, without status migrainosus: Secondary | ICD-10-CM

## 2023-04-18 MED ORDER — SUMATRIPTAN SUCCINATE 100 MG PO TABS: 100.0000 mg | ORAL_TABLET | ORAL | 0 refills | Status: DC | PRN

## 2023-04-22 ENCOUNTER — Telehealth: Payer: Self-pay

## 2023-04-22 NOTE — Telephone Encounter (Signed)
Multiple attempts made to reach patient- no answer-unable to leave a message for patient to call back to the office to reschedule PV appt- if nurse unable to reach patient by to end of business day ---PV and procedure appts will be cancelled and a no show letter will be sent to the patient;

## 2023-04-27 ENCOUNTER — Other Ambulatory Visit: Payer: Self-pay | Admitting: Internal Medicine

## 2023-04-27 MED ORDER — ALPRAZOLAM 0.5 MG PO TABS: 0.5000 mg | ORAL_TABLET | Freq: Every day | ORAL | 0 refills | Status: DC | PRN

## 2023-04-30 ENCOUNTER — Other Ambulatory Visit: Payer: Self-pay | Admitting: Internal Medicine

## 2023-05-02 NOTE — Telephone Encounter (Signed)
Requested Prescriptions  Pending Prescriptions Disp Refills   amLODipine (NORVASC) 5 MG tablet [Pharmacy Med Name: AMLODIPINE BESYLATE 5 MG TAB] 90 tablet 1    Sig: TAKE 1 TABLET (5 MG TOTAL) BY MOUTH DAILY.     Cardiovascular: Calcium Channel Blockers 2 Passed - 04/30/2023  9:25 AM      Passed - Last BP in normal range    BP Readings from Last 1 Encounters:  03/02/23 124/84         Passed - Last Heart Rate in normal range    Pulse Readings from Last 1 Encounters:  03/02/23 82         Passed - Valid encounter within last 6 months    Recent Outpatient Visits           2 months ago Encounter for general adult medical examination with abnormal findings   [place] [facility] [place], Salvadore Oxford, NP   8 months ago Attention deficit hyperactivity disorder (ADHD), predominantly inattentive type   [place] [facility] [place], Salvadore Oxford, NP   1 year ago Encounter for general adult medical examination with abnormal findings   [place] [facility] [place], Salvadore Oxford, NP   2 years ago Left-sided chest pain   [place] [facility] [place], Salvadore Oxford, NP   2 years ago Encounter for general adult medical examination with abnormal findings   [place] [facility] [place], Salvadore Oxford, NP       Future Appointments             In 4 months Baity, Salvadore Oxford, NP  [facility], [facility]

## 2023-05-09 ENCOUNTER — Encounter: Payer: Federal, State, Local not specified - PPO | Admitting: Gastroenterology

## 2023-05-15 ENCOUNTER — Telehealth: Payer: Federal, State, Local not specified - PPO | Admitting: Nurse Practitioner

## 2023-05-15 DIAGNOSIS — G43109 Migraine with aura, not intractable, without status migrainosus: Secondary | ICD-10-CM

## 2023-05-15 MED ORDER — ONDANSETRON HCL 4 MG PO TABS: 4.0000 mg | ORAL_TABLET | Freq: Three times a day (TID) | ORAL | 0 refills | Status: DC | PRN

## 2023-05-15 MED ORDER — SUMATRIPTAN SUCCINATE 100 MG PO TABS
100.0000 mg | ORAL_TABLET | ORAL | 0 refills | Status: DC | PRN
Start: 2023-05-15 — End: 2023-06-08

## 2023-05-15 NOTE — Progress Notes (Signed)
E-Visit for Nausea and Vomiting   We are sorry that you are not feeling well. Here is how we plan to help!  Based on what you have shared with me it looks like you have a Virus that is irritating your GI tract.  Vomiting is the forceful emptying of a portion of the stomach's content through the mouth.  Although nausea and vomiting can make you feel miserable, it's important to remember that these are not diseases, but rather symptoms of an underlying illness.  When we treat short term symptoms, we always caution that any symptoms that persist should be fully evaluated in a medical office.  I have prescribed a medication that will help alleviate your symptoms and allow you to stay hydrated:  Zofran 4 mg 1 tablet every 8 hours as needed for nausea and vomiting and refilled your imitrex for migraines  HOME CARE: Drink clear liquids.  This is very important! Dehydration (the lack of fluid) can lead to a serious complication.  Start off with 1 tablespoon every 5 minutes for 8 hours. You may begin eating bland foods after 8 hours without vomiting.  Start with saltine crackers, white bread, rice, mashed potatoes, applesauce. After 48 hours on a bland diet, you may resume a normal diet. Try to go to sleep.  Sleep often empties the stomach and relieves the need to vomit.  GET HELP RIGHT AWAY IF:  Your symptoms do not improve or worsen within 2 days after treatment. You have a fever for over 3 days. You cannot keep down fluids after trying the medication.  MAKE SURE YOU:  Understand these instructions. Will watch your condition. Will get help right away if you are not doing well or get worse.    Thank you for choosing an e-visit.  Your e-visit answers were reviewed by a board certified advanced clinical practitioner to complete your personal care plan. Depending upon the condition, your plan could have included both over the counter or prescription medications.  Please review your pharmacy  choice. Make sure the pharmacy is open so you can pick up prescription now. If there is a problem, you may contact your provider through [employer] and have the prescription routed to another pharmacy.  Your safety is important to [place]. If you have drug allergies check your prescription carefully.   For the next 24 hours you can use MyChart to ask questions about today's visit, request a non-urgent call back, or ask for a work or school excuse. You will get an email in the next two days asking about your experience. I hope that your e-visit has been valuable and will speed your recovery.

## 2023-05-15 NOTE — Progress Notes (Signed)
I have spent 5 minutes in review of e-visit questionnaire, review and updating patient chart, medical decision making and response to patient.  ° °Jerrell Mangel W Secilia Apps, NP ° °  °

## 2023-05-21 ENCOUNTER — Other Ambulatory Visit: Payer: Self-pay | Admitting: Internal Medicine

## 2023-05-21 DIAGNOSIS — G43109 Migraine with aura, not intractable, without status migrainosus: Secondary | ICD-10-CM

## 2023-05-23 NOTE — Telephone Encounter (Signed)
Requested medication (s) are due for refill today: no  Requested medication (s) are on the active medication list: yes   Last refill:  05/15/23 #10 0 refills  Future visit scheduled: yes in 3 months  Notes to clinic:  last ordered by Z. Meredeth Ide, NP 05/15/23. Do you want to refill Rx?     Requested Prescriptions  Pending Prescriptions Disp Refills   SUMAtriptan (IMITREX) 100 MG tablet [Pharmacy Med Name: SUMATRIPTAN SUCC 100 MG TABLET] 10 tablet 0    Sig: MAY TAKE 1TAB BY MOUTH AS NEEDED FOR MIGRAINE*MAY REPEAT 1TAB IN 2HRS IF HEADACHE PERSISTS/RECURS     Neurology:  Migraine Therapy - Triptan Passed - 05/21/2023  1:51 PM      Passed - Last BP in normal range    BP Readings from Last 1 Encounters:  03/02/23 124/84         Passed - Valid encounter within last 12 months    Recent Outpatient Visits           2 months ago Encounter for general adult medical examination with abnormal findings   [place] [facility] [place], Salvadore Oxford, NP   9 months ago Attention deficit hyperactivity disorder (ADHD), predominantly inattentive type   [place] [facility] [place], Salvadore Oxford, NP   1 year ago Encounter for general adult medical examination with abnormal findings   [place] [facility] [place], Salvadore Oxford, NP   2 years ago Left-sided chest pain   [place] [facility] [place], Salvadore Oxford, NP   2 years ago Encounter for general adult medical examination with abnormal findings    [facility] [place], Salvadore Oxford, NP       Future Appointments             In 3 months Baity, Salvadore Oxford, NP  [facility] [place]

## 2023-05-25 ENCOUNTER — Other Ambulatory Visit: Payer: Self-pay | Admitting: Internal Medicine

## 2023-05-26 MED ORDER — ALPRAZOLAM 0.5 MG PO TABS: 0.5000 mg | ORAL_TABLET | Freq: Every day | ORAL | 0 refills | Status: DC | PRN

## 2023-05-26 NOTE — Telephone Encounter (Signed)
Requested medication (s) are due for refill today: yes  Requested medication (s) are on the active medication list: yes  Last refill:  04/27/23  Future visit scheduled: yes  Notes to clinic:  Unable to refill per protocol, cannot delegate.      Requested Prescriptions  Pending Prescriptions Disp Refills   ALPRAZolam (XANAX) 0.5 MG tablet [Pharmacy Med Name: ALPRAZOLAM 0.5 MG TABLET] 20 tablet 0    Sig: TAKE 1 TABLET BY MOUTH DAILY AS NEEDED. PLEASE SCHEDULE AN OFFICE VISIT BEFORE ANYMORE REFILLS.     Not Delegated - Psychiatry: Anxiolytics/Hypnotics 2 Failed - 05/25/2023  2:11 PM      Failed - This refill cannot be delegated      Failed - Urine Drug Screen completed in last 360 days      Passed - Patient is not pregnant      Passed - Valid encounter within last 6 months    Recent Outpatient Visits           2 months ago Encounter for general adult medical examination with abnormal findings   [place] [facility] [place], Salvadore Oxford, NP   9 months ago Attention deficit hyperactivity disorder (ADHD), predominantly inattentive type   [place] [facility] [place], Salvadore Oxford, NP   1 year ago Encounter for general adult medical examination with abnormal findings   [place] [facility] [place], Salvadore Oxford, NP   2 years ago Left-sided chest pain   [place] [facility] [place], Salvadore Oxford, NP   2 years ago Encounter for general adult medical examination with abnormal findings   [place] [facility] [place], Salvadore Oxford, NP       Future Appointments             In 3 months Baity, Salvadore Oxford, NP [place] [facility], [facility]

## 2023-06-06 ENCOUNTER — Encounter: Payer: Self-pay | Admitting: Internal Medicine

## 2023-06-06 NOTE — Telephone Encounter (Signed)
I am reviewing these messages now. I would be happy to assist while Rene Kocher is out of office this week. Yes, if it is going to be a medical documented excuse, we would need a lot more information. 2 weeks is a long time. I would assume she means FMLA leave of continuous absence etc, so we would need to make sure she is getting it approved by her employer / HR and has paper work etc.  If it is a non medical reason (no symptoms on her end) for time off work is unlikely to get approved and may be rejected by her job etc.  Adding Regina on CC chart here as well. If you are checking these notes / messages. Let me know if you have more info on this patient in particular that I should be aware of with regarding to providing her time off work etc.  If she is agreeable we can do a virtual video visit work in add on if possible before the holiday.   Otherwise, if she gets paperwork and it is not due immediately, perhaps Rene Kocher can link up with her next week?  Saralyn Pilar, DO [facility] 06/06/2023, 7:37 PM

## 2023-06-07 NOTE — Telephone Encounter (Signed)
Thank you for handling this. I'm not sure what she's got going on with this family situation. She has never really brought up any family issues in the past and I don't think I've ever had to fill out FMLA paperwork. 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 I give her 20 xanax a month and she consistently request more and request to fill early but I have denied these request. I agree that more information is needed.

## 2023-06-08 ENCOUNTER — Other Ambulatory Visit: Payer: Self-pay | Admitting: Internal Medicine

## 2023-06-08 DIAGNOSIS — G43109 Migraine with aura, not intractable, without status migrainosus: Secondary | ICD-10-CM

## 2023-06-08 NOTE — Telephone Encounter (Signed)
Requested Prescriptions  Pending Prescriptions Disp Refills   SUMAtriptan (IMITREX) 100 MG tablet [Pharmacy Med Name: SUMATRIPTAN SUCC 100 MG TABLET] 10 tablet 2    Sig: MAY TAKE 1TAB BY MOUTH AS NEEDED FOR MIGRAINE*MAY REPEAT 1TAB IN 2HRS IF HEADACHE PERSISTS/RECURS     Neurology:  Migraine Therapy - Triptan Passed - 06/08/2023  4:35 PM      Passed - Last BP in normal range    BP Readings from Last 1 Encounters:  03/02/23 124/84         Passed - Valid encounter within last 12 months    Recent Outpatient Visits           3 months ago Encounter for general adult medical examination with abnormal findings   [place] [facility] [place], Salvadore Oxford, NP   10 months ago Attention deficit hyperactivity disorder (ADHD), predominantly inattentive type   [place] [facility] [place], Salvadore Oxford, NP   1 year ago Encounter for general adult medical examination with abnormal findings   [place] [facility] [place], Salvadore Oxford, NP   2 years ago Left-sided chest pain   [place] [facility] [place], Salvadore Oxford, NP   2 years ago Encounter for general adult medical examination with abnormal findings   [place] [facility] [place], Salvadore Oxford, NP       Future Appointments             In 3 months Baity, Salvadore Oxford, NP  [facility]

## 2023-06-21 ENCOUNTER — Other Ambulatory Visit: Payer: Self-pay | Admitting: Internal Medicine

## 2023-06-22 MED ORDER — ALPRAZOLAM 0.5 MG PO TABS: 0.5000 mg | ORAL_TABLET | Freq: Every day | ORAL | 0 refills | Status: DC | PRN

## 2023-06-22 NOTE — Telephone Encounter (Signed)
Requested medication (s) are due for refill today - no  Requested medication (s) are on the active medication list -yes  Future visit scheduled -yes  Last refill: 06/22/23 #20  Notes to clinic: duplicate request, non delegated Rx  Requested Prescriptions  Pending Prescriptions Disp Refills   ALPRAZolam (XANAX) 0.5 MG tablet [Pharmacy Med Name: ALPRAZOLAM 0.5 MG TABLET] 20 tablet 0    Sig: TAKE 1 TABLET BY MOUTH EVERY DAY AS NEEDED FOR ANXIETY     Not Delegated - Psychiatry: Anxiolytics/Hypnotics 2 Failed - 06/21/2023 11:49 AM      Failed - This refill cannot be delegated      Failed - Urine Drug Screen completed in last 360 days      Passed - Patient is not pregnant      Passed - Valid encounter within last 6 months    Recent Outpatient Visits           3 months ago Encounter for general adult medical examination with abnormal findings   [place] [facility] [place], Salvadore Oxford, NP   10 months ago Attention deficit hyperactivity disorder (ADHD), predominantly inattentive type   [place] [facility] [place], Salvadore Oxford, NP   1 year ago Encounter for general adult medical examination with abnormal findings   [place] [facility] [place], Salvadore Oxford, NP   2 years ago Left-sided chest pain   [place] [facility] [place], Salvadore Oxford, NP   2 years ago Encounter for general adult medical examination with abnormal findings   [place] [facility] [place], Salvadore Oxford, NP       Future Appointments             In 2 months Baity, Salvadore Oxford, NP [place] [facility], [facility]               Requested Prescriptions  Pending Prescriptions Disp Refills   ALPRAZolam Prudy Feeler) 0.5 MG tablet [Pharmacy Med Name: ALPRAZOLAM 0.5 MG TABLET] 20 tablet 0    Sig: TAKE 1 TABLET BY MOUTH EVERY DAY AS NEEDED FOR ANXIETY     Not Delegated - Psychiatry: Anxiolytics/Hypnotics 2 Failed - 06/21/2023 11:49 AM       Failed - This refill cannot be delegated      Failed - Urine Drug Screen completed in last 360 days      Passed - Patient is not pregnant      Passed - Valid encounter within last 6 months    Recent Outpatient Visits           3 months ago Encounter for general adult medical examination with abnormal findings   [place] [facility] [place], [place], NP   10 months ago Attention deficit hyperactivity disorder (ADHD), predominantly inattentive type   [place] [facility] [place], Salvadore Oxford, NP   1 year ago Encounter for general adult medical examination with abnormal findings   [place] [facility] [place], Salvadore Oxford, NP   2 years ago Left-sided chest pain   [place] [facility] [place], Salvadore Oxford, NP   2 years ago Encounter for general adult medical examination with abnormal findings    [facility] [place], Salvadore Oxford, NP       Future Appointments             In 2 months Lorre Munroe,  NP [place] [facility], [place]

## 2023-07-04 ENCOUNTER — Other Ambulatory Visit: Payer: Self-pay

## 2023-07-04 DIAGNOSIS — G43109 Migraine with aura, not intractable, without status migrainosus: Secondary | ICD-10-CM

## 2023-07-04 MED ORDER — SUMATRIPTAN SUCCINATE 100 MG PO TABS: 100.0000 mg | ORAL_TABLET | ORAL | 2 refills | Status: DC | PRN

## 2023-07-20 ENCOUNTER — Other Ambulatory Visit: Payer: Self-pay | Admitting: Internal Medicine

## 2023-07-20 MED ORDER — ALPRAZOLAM 0.5 MG PO TABS: 0.5000 mg | ORAL_TABLET | Freq: Every day | ORAL | 0 refills | Status: DC | PRN

## 2023-07-20 MED ORDER — AMPHETAMINE-DEXTROAMPHET ER 20 MG PO CP24: 20.0000 mg | ORAL_CAPSULE | ORAL | 0 refills | Status: DC

## 2023-07-20 NOTE — Telephone Encounter (Signed)
 Requested medication (s) are due for refill today - no  Requested medication (s) are on the active medication list -yes  Future visit scheduled -yes  Last refill: 07/20/23 #20  Notes to clinic: duplicate request- filled 07/20/23, non delegated Rx  Requested Prescriptions  Pending Prescriptions Disp Refills   ALPRAZolam  (XANAX ) 0.5 MG tablet [Pharmacy Med Name: ALPRAZOLAM  0.5 MG TABLET] 20 tablet 0    Sig: TAKE 1 TABLET BY MOUTH EVERY DAY AS NEEDED FOR ANXIETY     Not Delegated - Psychiatry: Anxiolytics/Hypnotics 2 Failed - 07/20/2023  8:42 AM      Failed - This refill cannot be delegated      Failed - Urine Drug Screen completed in last 360 days      Passed - Patient is not pregnant      Passed - Valid encounter within last 6 months    Recent Outpatient Visits           4 months ago Encounter for general adult medical examination with abnormal findings   [place] [facility] [place], [place], NP   11 months ago Attention deficit hyperactivity disorder (ADHD), predominantly inattentive type   [place] [facility] [place], Angeline ORN, NP   1 year ago Encounter for general adult medical examination with abnormal findings   [place] [facility] [place], Angeline ORN, NP   2 years ago Left-sided chest pain   [facility] [place], Angeline ORN, NP   2 years ago Encounter for general adult medical examination with abnormal findings   [place] [facility] [place], Angeline ORN, NP       Future Appointments             In 1 month Baity, Angeline ORN, NP [place] [facility] [place], [facility]               Requested Prescriptions  Pending Prescriptions Disp Refills   ALPRAZolam  (XANAX ) 0.5 MG tablet [Pharmacy Med Name: ALPRAZOLAM  0.5 MG TABLET] 20 tablet 0    Sig: TAKE 1 TABLET BY MOUTH EVERY DAY AS NEEDED FOR ANXIETY     Not Delegated - Psychiatry: Anxiolytics/Hypnotics 2 Failed - 07/20/2023   8:42 AM      Failed - This refill cannot be delegated      Failed - Urine Drug Screen completed in last 360 days      Passed - Patient is not pregnant      Passed - Valid encounter within last 6 months    Recent Outpatient Visits           4 months ago Encounter for general adult medical examination with abnormal findings   [place] [facility] [place], [place], NP   11 months ago Attention deficit hyperactivity disorder (ADHD), predominantly inattentive type   [place] [facility] [place], Angeline ORN, NP   1 year ago Encounter for general adult medical examination with abnormal findings   [place] [facility] [place], Angeline ORN, NP   2 years ago Left-sided chest pain   [place] [facility] [place], Angeline ORN, NP   2 years ago Encounter for general adult medical examination with abnormal findings    [facility] [place], Angeline ORN, NP       Future Appointments             In 1  month Baity, Angeline ORN, NP [place] [facility], [facility]

## 2023-08-18 ENCOUNTER — Other Ambulatory Visit: Payer: Self-pay | Admitting: Internal Medicine

## 2023-08-18 NOTE — Telephone Encounter (Signed)
 Requested medication (s) are due for refill today: Yes  Requested medication (s) are on the active medication list: Yes  Last refill:  07/20/23  Future visit scheduled: Yes  Notes to clinic:  Unable to refill per protocol, cannot delegate.      Requested Prescriptions  Pending Prescriptions Disp Refills   ALPRAZolam  (XANAX ) 0.5 MG tablet [Pharmacy Med Name: ALPRAZOLAM  0.5 MG TABLET] 20 tablet 0    Sig: TAKE 1 TABLET BY MOUTH EVERY DAY AS NEEDED FOR ANXIETY     Not Delegated - Psychiatry: Anxiolytics/Hypnotics 2 Failed - 08/18/2023  1:48 PM      Failed - This refill cannot be delegated      Failed - Urine Drug Screen completed in last 360 days      Passed - Patient is not pregnant      Passed - Valid encounter within last 6 months    Recent Outpatient Visits           5 months ago Encounter for general adult medical examination with abnormal findings   [place] [facility] [place], Angeline ORN, NP   1 year ago Attention deficit hyperactivity disorder (ADHD), predominantly inattentive type   [place] [facility] [place], Angeline ORN, NP   1 year ago Encounter for general adult medical examination with abnormal findings   [place] [facility] [place], Angeline ORN, NP   2 years ago Left-sided chest pain   [place] [facility] [place], Angeline ORN, NP   2 years ago Encounter for general adult medical examination with abnormal findings   [place] [facility] [place], Angeline ORN, NP       Future Appointments             In 2 weeks Antonette, Angeline ORN, NP [place] [facility], [facility]

## 2023-08-23 ENCOUNTER — Other Ambulatory Visit: Payer: Self-pay | Admitting: Internal Medicine

## 2023-08-24 MED ORDER — AMPHETAMINE-DEXTROAMPHET ER 20 MG PO CP24: 20.0000 mg | ORAL_CAPSULE | ORAL | 0 refills | Status: DC

## 2023-09-05 ENCOUNTER — Encounter: Payer: Self-pay | Admitting: Internal Medicine

## 2023-09-06 ENCOUNTER — Ambulatory Visit: Payer: Self-pay | Admitting: Internal Medicine

## 2023-09-13 ENCOUNTER — Other Ambulatory Visit: Payer: Self-pay | Admitting: Internal Medicine

## 2023-09-13 MED ORDER — ALPRAZOLAM 0.5 MG PO TABS: ORAL_TABLET | ORAL | 0 refills | Status: DC

## 2023-09-19 ENCOUNTER — Ambulatory Visit: Payer: Self-pay | Admitting: Internal Medicine

## 2023-09-19 ENCOUNTER — Ambulatory Visit: Payer: Federal, State, Local not specified - PPO | Admitting: Internal Medicine

## 2023-09-19 MED ORDER — ALBUTEROL SULFATE HFA 108 (90 BASE) MCG/ACT IN AERS: 2.0000 | INHALATION_SPRAY | Freq: Four times a day (QID) | RESPIRATORY_TRACT | 0 refills | Status: DC | PRN

## 2023-09-19 NOTE — Telephone Encounter (Signed)
Albuterol sent to pharmacy. 

## 2023-09-19 NOTE — Progress Notes (Deleted)
 Subjective:    Patient ID: Linda [name], female    DOB: 1976/05/25, 48 y.o.   MRN: [id number]  HPI  Patient presents to clinic today for follow-up of chronic conditions.  Migraines: These occur 3-4 times a month.  Triggered by smells, computer screens.  She takes imitrex and phenergan as needed with some relief of symptoms.  She does not follow with neurology.  HTN: Her BP today is 124/66.  She is taking amlodipine as prescribed.  ECG from 01/2020 reviewed.  GERD: Status post gastric bypass.  She is not currently taking any medications for this but has been on esomeprazole in the past. There is no upper GI on file.  HLD: Her last LDL was 90, triglycerides 829, 02/2023.  She is not taking any cholesterol-lowering medication at this time.  She does not consume low-fat diet.  Anxiety and depression: Chronic, managed on xanax as needed.  She is not currently seeing a therapist.  She denies SI/HI.  ADD: Managed with adderall.  She does not follow with psychiatry.  Review of Systems     Past Medical History:  Diagnosis Date   Alcohol abuse    Anxiety    Asthma    GERD (gastroesophageal reflux disease)    Heart murmur    while pregnant   Hypertension    Migraine     Current Outpatient Medications  Medication Sig Dispense Refill   albuterol (VENTOLIN HFA) 108 (90 Base) MCG/ACT inhaler Inhale 2 puffs into the lungs every 6 (six) hours as needed for wheezing or shortness of breath. 18 g 0   ALPRAZolam (XANAX) 0.5 MG tablet TAKE 1 TABLET BY MOUTH EVERY DAY AS NEEDED FOR ANXIETY 20 tablet 0   amLODipine (NORVASC) 5 MG tablet TAKE 1 TABLET (5 MG TOTAL) BY MOUTH DAILY. 90 tablet 1   amphetamine-dextroamphetamine (ADDERALL XR) 20 MG 24 hr capsule Take 1 capsule (20 mg total) by mouth every morning. 30 capsule 0   ondansetron (ZOFRAN) 4 MG tablet Take 1 tablet (4 mg total) by mouth every 8 (eight) hours as needed for nausea or vomiting. 40 tablet 0   SUMAtriptan (IMITREX) 100 MG tablet  Take 1 tablet (100 mg total) by mouth every 2 (two) hours as needed for migraine. May repeat in 2 hours if headache persists or recurs. 10 tablet 2   No current facility-administered medications for this visit.    Allergies  Allergen Reactions   Bacitra-Neomycin-Polymyxin-Hc Other (See Comments)    Pain and swelling of ear canal    Family History  Problem Relation Age of Onset   Breast cancer Mother        59s   Hypertension Mother    Uterine cancer Mother    Stroke Mother    Heart attack Mother    Hypertension Father    Diabetes Father    Multiple sclerosis Father    Breast cancer Maternal Aunt    Diabetes Maternal Aunt    Diabetes Maternal Uncle    Cancer Maternal Grandmother        PANCREATIC   Hypertension Maternal Grandmother    Diabetes Maternal Grandmother    Cancer Maternal Grandfather        LUNG   Hypertension Maternal Grandfather    Diabetes Maternal Grandfather    Cancer Paternal Grandmother        THROAT   Alzheimer's disease Paternal Grandmother     Social History   Socioeconomic History   Marital status: Divorced  Spouse name: Not on file   Number of children: Not on file   Years of education: Not on file   Highest education level: Master's degree (e.g., MA, MS, MEng, MEd, MSW, MBA)  Occupational History   Not on file  Tobacco Use   Smoking status: Never   Smokeless tobacco: Never  Vaping Use   Vaping status: Never Used  Substance and Sexual Activity   Alcohol use: Yes    Comment: occ   Drug use: No   Sexual activity: Not on file  Other Topics Concern   Not on file  Social History Narrative   Not on file   Social Drivers of Health   Financial Resource Strain: Low Risk  (02/28/2023)   Overall Financial Resource Strain (CARDIA)    Difficulty of Paying Living Expenses: Not hard at all  Food Insecurity: No Food Insecurity (02/28/2023)   Hunger Vital Sign    Worried About Running Out of Food in the Last Year: Never true    Ran Out of  Food in the Last Year: Never true  Transportation Needs: No Transportation Needs (02/28/2023)   PRAPARE - Administrator, Civil Service (Medical): No    Lack of Transportation (Non-Medical): No  Physical Activity: Insufficiently Active (02/28/2023)   Exercise Vital Sign    Days of Exercise per Week: 3 days    Minutes of Exercise per Session: 20 min  Stress: Stress Concern Present (02/28/2023)   [employer] of Occupational Health - Occupational Stress Questionnaire    Feeling of Stress : To some extent  Social Connections: Moderately Integrated (02/28/2023)   Social Connection and Isolation Panel [NHANES]    Frequency of Communication with Friends and Family: More than three times a week    Frequency of Social Gatherings with Friends and Family: Once a week    Attends Religious Services: More than 4 times per year    Active Member of [employer] or Organizations: Yes    Attends Banker Meetings: More than 4 times per year    Marital Status: Divorced  Intimate Partner Violence: Not At Risk (03/16/2022)   Humiliation, Afraid, Rape, and Kick questionnaire    Fear of Current or Ex-Partner: No    Emotionally Abused: No    Physically Abused: No    Sexually Abused: No     Constitutional: Patient reports intermittent headaches.  Denies fever, malaise, fatigue, or abrupt weight changes.  HEENT:  Denies eye pain, eye redness, ear pain, ringing in the ears, wax buildup, runny nose, nasal congestion, bloody nose, or sore throat. Respiratory: Denies difficulty breathing, shortness of breath, cough or sputum production.   Cardiovascular: Denies chest pain, chest tightness, palpitations or swelling in the hands or feet.  Gastrointestinal: Patient reports intermittent reflux.  Denies abdominal pain, bloating, constipation, diarrhea or blood in the stool.  GU: Denies urgency, frequency, pain with urination, burning sensation, blood in urine, odor or discharge. Musculoskeletal:  Denies decrease in range of motion, difficulty with gait, muscle pain or joint pain and swelling.  Skin: Denies redness, rashes, lesions or ulcercations.  Neurological: Patient reports inattention.  Denies dizziness, difficulty with memory, difficulty with speech or problems with balance and coordination.  Psych: Patient has a history of anxiety and depression denies SI/HI.  No other specific complaints in a complete review of systems (except as listed in HPI above).  Objective:   Physical Exam LMP 07/12/2011   Wt Readings from Last 3 Encounters:  03/02/23 188 lb (85.3  kg)  01/23/23 190 lb (86.2 kg)  08/11/22 190 lb (86.2 kg)    General: Appears her stated age, overweight, in NAD. Skin: Warm, dry and intact. HEENT: Head: normal shape and size; Eyes: sclera white, no icterus, conjunctiva pink, PERRLA and EOMs intact; Ears: Tm's gray with tubes grown into the TM;  Cardiovascular: Normal rate and rhythm. S1,S2 noted.  No murmur, rubs or gallops noted. No JVD or BLE edema. Pulmonary/Chest: Normal effort and positive vesicular breath sounds. No respiratory distress. No wheezes, rales or ronchi noted.  Abdomen: Soft and nontender. Normal bowel sounds.  Musculoskeletal: No difficulty with gait.  Neurological: Alert and oriented. Coordination normal.  Psychiatric: Mood and affect normal. Behavior is normal. Judgment and thought content normal.     BMET    Component Value Date/Time   NA 140 03/02/2023 0839   NA 137 05/31/2013 0846   K 4.0 03/02/2023 0839   K 3.8 05/31/2013 0846   CL 106 03/02/2023 0839   CL 106 05/31/2013 0846   CO2 25 03/02/2023 0839   CO2 26 05/31/2013 0846   GLUCOSE 79 03/02/2023 0839   GLUCOSE 93 05/31/2013 0846   BUN 10 03/02/2023 0839   BUN 10 05/31/2013 0846   CREATININE 0.82 03/02/2023 0839   CALCIUM 8.9 03/02/2023 0839   CALCIUM 8.2 (L) 05/31/2013 0846   GFRNONAA >60 01/23/2023 1617   GFRNONAA >60 05/31/2013 0846   GFRAA >60 02/08/2020 0600   GFRAA  >60 05/31/2013 0846    Lipid Panel     Component Value Date/Time   CHOL 208 (H) 03/02/2023 0839   TRIG 141 03/02/2023 0839   HDL 94 03/02/2023 0839   CHOLHDL 2.2 03/02/2023 0839   VLDL 35.2 05/17/2019 1606   LDLCALC 90 03/02/2023 0839    CBC    Component Value Date/Time   WBC 6.5 03/02/2023 0839   RBC 4.35 03/02/2023 0839   HGB 14.0 03/02/2023 0839   HGB 13.3 05/31/2013 0846   HCT 41.6 03/02/2023 0839   HCT 39.2 05/31/2013 0846   PLT 286 03/02/2023 0839   PLT 219 05/31/2013 0846   MCV 95.6 03/02/2023 0839   MCV 88 05/31/2013 0846   MCH 32.2 03/02/2023 0839   MCHC 33.7 03/02/2023 0839   RDW 12.9 03/02/2023 0839   RDW 12.8 05/31/2013 0846   LYMPHSABS 1.2 01/23/2023 1617   LYMPHSABS 2.1 05/31/2013 0846   MONOABS 0.3 01/23/2023 1617   MONOABS 0.4 05/31/2013 0846   EOSABS 0.1 01/23/2023 1617   EOSABS 0.1 05/31/2013 0846   BASOSABS 0.1 01/23/2023 1617   BASOSABS 0.1 05/31/2013 0846    Hgb A1C Lab Results  Component Value Date   HGBA1C 5.6 03/02/2023           Assessment & Plan:    RTC in 6 months for annual exam Nicki Reaper, NP

## 2023-09-19 NOTE — Addendum Note (Signed)
 Addended by: Lorre Munroe on: 09/19/2023 01:05 PM   Modules accepted: Orders

## 2023-09-19 NOTE — Telephone Encounter (Signed)
  Chief Complaint: cough  causing chest discomfort. Requesting to cancel appt today  Symptoms: cough, non productive. Reports unable to cough up sputum. Chest pain after coughing.  Frequency: couple of days  Pertinent Negatives: Patient denies chest pain with difficulty breathing no fever  Disposition: [] ED /[] Urgent Care (no appt availability in office) / [x] Appointment(In office/virtual)/ []  [facility]/ [] Home Care/ [] Refused Recommended Disposition /[]  Mobile Bus/ []  Follow-up with PCP Additional Notes:   Patient requested to cancel appt today and reschedule for 09/22/23 for cough and chronic conditions f/u. Requesting refills of inhaler. Patient reports she needs to go lay down and does not want to have to drive to [place] 2 times. Please advise Recommended stay hydrated and if sx worsen go to UC /ED.        Copied from CRM [id number]. Topic: Clinical - Red Word Triage >> Sep 19, 2023 12:13 PM [place] B wrote: [facility] that prompted transfer to Nurse Triage: pt has chest pain and lots of coughing Reason for Disposition  Cough has been present for > 3 weeks    Less than 3 weeks  Answer Assessment - Initial Assessment Questions 1. ONSET: "When did the cough begin?"      Na  2. SEVERITY: "How bad is the cough today?"      Coughing unable to cough up sputum 3. SPUTUM: "Describe the color of your sputum" (none, dry cough; clear, white, yellow, green)     None  4. HEMOPTYSIS: "Are you coughing up any blood?" If so ask: "How much?" (flecks, streaks, tablespoons, etc.)     na 5. DIFFICULTY BREATHING: "Are you having difficulty breathing?" If Yes, ask: "How bad is it?" (e.g., mild, moderate, severe)    - MILD: No SOB at rest, mild SOB with walking, speaks normally in sentences, can lie down, no retractions, pulse < 100.    - MODERATE: SOB at rest, SOB with minimal exertion and prefers to sit, cannot lie down flat, speaks in phrases, mild retractions, audible wheezing,  pulse 100-120.    - SEVERE: Very SOB at rest, speaks in single words, struggling to breathe, sitting hunched forward, retractions, pulse > 120      Denies  6. FEVER: "Do you have a fever?" If Yes, ask: "What is your temperature, how was it measured, and when did it start?"     no 7. CARDIAC HISTORY: "Do you have any history of heart disease?" (e.g., heart attack, congestive heart failure)      na 8. LUNG HISTORY: "Do you have any history of lung disease?"  (e.g., pulmonary embolus, asthma, emphysema)     See hx  9. PE RISK FACTORS: "Do you have a history of blood clots?" (or: recent major surgery, recent prolonged travel, bedridden)     Na  10. OTHER SYMPTOMS: "Do you have any other symptoms?" (e.g., runny nose, wheezing, chest pain)       Chest pain with coughing  runny nose  11. PREGNANCY: "Is there any chance you are pregnant?" "When was your last menstrual period?"       Na  12. TRAVEL: "Have you traveled out of the country in the last month?" (e.g., travel history, exposures)       Na  Protocols used: Cough - Acute Non-Productive-A-AH

## 2023-09-22 ENCOUNTER — Ambulatory Visit: Admitting: Internal Medicine

## 2023-09-22 NOTE — Progress Notes (Deleted)
 Subjective:    Patient ID: Linda [name], female    DOB: 1976/05/25, 48 y.o.   MRN: [id number]  HPI  Patient presents to clinic today for follow-up of chronic conditions.  Migraines: These occur 3-4 times a month.  Triggered by smells, computer screens.  She takes imitrex and phenergan as needed with some relief of symptoms.  She does not follow with neurology.  HTN: Her BP today is 124/66.  She is taking amlodipine as prescribed.  ECG from 01/2020 reviewed.  GERD: Status post gastric bypass.  She is not currently taking any medications for this but has been on esomeprazole in the past. There is no upper GI on file.  HLD: Her last LDL was 90, triglycerides 829, 02/2023.  She is not taking any cholesterol-lowering medication at this time.  She does not consume low-fat diet.  Anxiety and depression: Chronic, managed on xanax as needed.  She is not currently seeing a therapist.  She denies SI/HI.  ADD: Managed with adderall.  She does not follow with psychiatry.  Review of Systems     Past Medical History:  Diagnosis Date   Alcohol abuse    Anxiety    Asthma    GERD (gastroesophageal reflux disease)    Heart murmur    while pregnant   Hypertension    Migraine     Current Outpatient Medications  Medication Sig Dispense Refill   albuterol (VENTOLIN HFA) 108 (90 Base) MCG/ACT inhaler Inhale 2 puffs into the lungs every 6 (six) hours as needed for wheezing or shortness of breath. 18 g 0   ALPRAZolam (XANAX) 0.5 MG tablet TAKE 1 TABLET BY MOUTH EVERY DAY AS NEEDED FOR ANXIETY 20 tablet 0   amLODipine (NORVASC) 5 MG tablet TAKE 1 TABLET (5 MG TOTAL) BY MOUTH DAILY. 90 tablet 1   amphetamine-dextroamphetamine (ADDERALL XR) 20 MG 24 hr capsule Take 1 capsule (20 mg total) by mouth every morning. 30 capsule 0   ondansetron (ZOFRAN) 4 MG tablet Take 1 tablet (4 mg total) by mouth every 8 (eight) hours as needed for nausea or vomiting. 40 tablet 0   SUMAtriptan (IMITREX) 100 MG tablet  Take 1 tablet (100 mg total) by mouth every 2 (two) hours as needed for migraine. May repeat in 2 hours if headache persists or recurs. 10 tablet 2   No current facility-administered medications for this visit.    Allergies  Allergen Reactions   Bacitra-Neomycin-Polymyxin-Hc Other (See Comments)    Pain and swelling of ear canal    Family History  Problem Relation Age of Onset   Breast cancer Mother        59s   Hypertension Mother    Uterine cancer Mother    Stroke Mother    Heart attack Mother    Hypertension Father    Diabetes Father    Multiple sclerosis Father    Breast cancer Maternal Aunt    Diabetes Maternal Aunt    Diabetes Maternal Uncle    Cancer Maternal Grandmother        PANCREATIC   Hypertension Maternal Grandmother    Diabetes Maternal Grandmother    Cancer Maternal Grandfather        LUNG   Hypertension Maternal Grandfather    Diabetes Maternal Grandfather    Cancer Paternal Grandmother        THROAT   Alzheimer's disease Paternal Grandmother     Social History   Socioeconomic History   Marital status: Divorced  Spouse name: Not on file   Number of children: Not on file   Years of education: Not on file   Highest education level: Master's degree (e.g., MA, MS, MEng, MEd, MSW, MBA)  Occupational History   Not on file  Tobacco Use   Smoking status: Never   Smokeless tobacco: Never  Vaping Use   Vaping status: Never Used  Substance and Sexual Activity   Alcohol use: Yes    Comment: occ   Drug use: No   Sexual activity: Not on file  Other Topics Concern   Not on file  Social History Narrative   Not on file   Social Drivers of Health   Financial Resource Strain: Low Risk  (02/28/2023)   Overall Financial Resource Strain (CARDIA)    Difficulty of Paying Living Expenses: Not hard at all  Food Insecurity: No Food Insecurity (02/28/2023)   Hunger Vital Sign    Worried About Running Out of Food in the Last Year: Never true    Ran Out of  Food in the Last Year: Never true  Transportation Needs: No Transportation Needs (02/28/2023)   PRAPARE - Administrator, Civil Service (Medical): No    Lack of Transportation (Non-Medical): No  Physical Activity: Insufficiently Active (02/28/2023)   Exercise Vital Sign    Days of Exercise per Week: 3 days    Minutes of Exercise per Session: 20 min  Stress: Stress Concern Present (02/28/2023)   [employer] of Occupational Health - Occupational Stress Questionnaire    Feeling of Stress : To some extent  Social Connections: Moderately Integrated (02/28/2023)   Social Connection and Isolation Panel [NHANES]    Frequency of Communication with Friends and Family: More than three times a week    Frequency of Social Gatherings with Friends and Family: Once a week    Attends Religious Services: More than 4 times per year    Active Member of [employer] or Organizations: Yes    Attends Banker Meetings: More than 4 times per year    Marital Status: Divorced  Intimate Partner Violence: Not At Risk (03/16/2022)   Humiliation, Afraid, Rape, and Kick questionnaire    Fear of Current or Ex-Partner: No    Emotionally Abused: No    Physically Abused: No    Sexually Abused: No     Constitutional: Patient reports intermittent headaches.  Denies fever, malaise, fatigue, or abrupt weight changes.  HEENT:  Denies eye pain, eye redness, ear pain, ringing in the ears, wax buildup, runny nose, nasal congestion, bloody nose, or sore throat. Respiratory: Denies difficulty breathing, shortness of breath, cough or sputum production.   Cardiovascular: Denies chest pain, chest tightness, palpitations or swelling in the hands or feet.  Gastrointestinal: Patient reports intermittent reflux.  Denies abdominal pain, bloating, constipation, diarrhea or blood in the stool.  GU: Denies urgency, frequency, pain with urination, burning sensation, blood in urine, odor or discharge. Musculoskeletal:  Denies decrease in range of motion, difficulty with gait, muscle pain or joint pain and swelling.  Skin: Denies redness, rashes, lesions or ulcercations.  Neurological: Patient reports inattention.  Denies dizziness, difficulty with memory, difficulty with speech or problems with balance and coordination.  Psych: Patient has a history of anxiety and depression denies SI/HI.  No other specific complaints in a complete review of systems (except as listed in HPI above).  Objective:   Physical Exam LMP 07/12/2011   Wt Readings from Last 3 Encounters:  03/02/23 188 lb (85.3  kg)  01/23/23 190 lb (86.2 kg)  08/11/22 190 lb (86.2 kg)    General: Appears her stated age, overweight, in NAD. Skin: Warm, dry and intact. HEENT: Head: normal shape and size; Eyes: sclera white, no icterus, conjunctiva pink, PERRLA and EOMs intact; Ears: Tm's gray with tubes grown into the TM;  Cardiovascular: Normal rate and rhythm. S1,S2 noted.  No murmur, rubs or gallops noted. No JVD or BLE edema. Pulmonary/Chest: Normal effort and positive vesicular breath sounds. No respiratory distress. No wheezes, rales or ronchi noted.  Abdomen: Soft and nontender. Normal bowel sounds.  Musculoskeletal: No difficulty with gait.  Neurological: Alert and oriented. Coordination normal.  Psychiatric: Mood and affect normal. Behavior is normal. Judgment and thought content normal.     BMET    Component Value Date/Time   NA 140 03/02/2023 0839   NA 137 05/31/2013 0846   K 4.0 03/02/2023 0839   K 3.8 05/31/2013 0846   CL 106 03/02/2023 0839   CL 106 05/31/2013 0846   CO2 25 03/02/2023 0839   CO2 26 05/31/2013 0846   GLUCOSE 79 03/02/2023 0839   GLUCOSE 93 05/31/2013 0846   BUN 10 03/02/2023 0839   BUN 10 05/31/2013 0846   CREATININE 0.82 03/02/2023 0839   CALCIUM 8.9 03/02/2023 0839   CALCIUM 8.2 (L) 05/31/2013 0846   GFRNONAA >60 01/23/2023 1617   GFRNONAA >60 05/31/2013 0846   GFRAA >60 02/08/2020 0600   GFRAA  >60 05/31/2013 0846    Lipid Panel     Component Value Date/Time   CHOL 208 (H) 03/02/2023 0839   TRIG 141 03/02/2023 0839   HDL 94 03/02/2023 0839   CHOLHDL 2.2 03/02/2023 0839   VLDL 35.2 05/17/2019 1606   LDLCALC 90 03/02/2023 0839    CBC    Component Value Date/Time   WBC 6.5 03/02/2023 0839   RBC 4.35 03/02/2023 0839   HGB 14.0 03/02/2023 0839   HGB 13.3 05/31/2013 0846   HCT 41.6 03/02/2023 0839   HCT 39.2 05/31/2013 0846   PLT 286 03/02/2023 0839   PLT 219 05/31/2013 0846   MCV 95.6 03/02/2023 0839   MCV 88 05/31/2013 0846   MCH 32.2 03/02/2023 0839   MCHC 33.7 03/02/2023 0839   RDW 12.9 03/02/2023 0839   RDW 12.8 05/31/2013 0846   LYMPHSABS 1.2 01/23/2023 1617   LYMPHSABS 2.1 05/31/2013 0846   MONOABS 0.3 01/23/2023 1617   MONOABS 0.4 05/31/2013 0846   EOSABS 0.1 01/23/2023 1617   EOSABS 0.1 05/31/2013 0846   BASOSABS 0.1 01/23/2023 1617   BASOSABS 0.1 05/31/2013 0846    Hgb A1C Lab Results  Component Value Date   HGBA1C 5.6 03/02/2023           Assessment & Plan:    RTC in 6 months for annual exam Nicki Reaper, NP

## 2023-09-30 ENCOUNTER — Emergency Department (HOSPITAL_BASED_OUTPATIENT_CLINIC_OR_DEPARTMENT_OTHER)

## 2023-09-30 ENCOUNTER — Encounter (HOSPITAL_BASED_OUTPATIENT_CLINIC_OR_DEPARTMENT_OTHER): Payer: Self-pay | Admitting: Emergency Medicine

## 2023-09-30 ENCOUNTER — Emergency Department (HOSPITAL_BASED_OUTPATIENT_CLINIC_OR_DEPARTMENT_OTHER)
Admission: EM | Admit: 2023-09-30 | Discharge: 2023-09-30 | Disposition: A | Discharge status: Home / Self Care | Attending: Emergency Medicine | Admitting: Emergency Medicine

## 2023-09-30 ENCOUNTER — Other Ambulatory Visit: Payer: Self-pay

## 2023-09-30 DIAGNOSIS — I10 Essential (primary) hypertension: Secondary | ICD-10-CM | POA: Insufficient documentation

## 2023-09-30 DIAGNOSIS — R519 Headache, unspecified: Secondary | ICD-10-CM | POA: Insufficient documentation

## 2023-09-30 DIAGNOSIS — J45909 Unspecified asthma, uncomplicated: Secondary | ICD-10-CM | POA: Diagnosis not present

## 2023-09-30 DIAGNOSIS — R079 Chest pain, unspecified: Secondary | ICD-10-CM

## 2023-09-30 DIAGNOSIS — R0789 Other chest pain: Secondary | ICD-10-CM | POA: Insufficient documentation

## 2023-09-30 DIAGNOSIS — R1012 Left upper quadrant pain: Secondary | ICD-10-CM | POA: Insufficient documentation

## 2023-09-30 DIAGNOSIS — R109 Unspecified abdominal pain: Secondary | ICD-10-CM | POA: Diagnosis not present

## 2023-09-30 DIAGNOSIS — R55 Syncope and collapse: Secondary | ICD-10-CM | POA: Diagnosis not present

## 2023-09-30 DIAGNOSIS — M25552 Pain in left hip: Secondary | ICD-10-CM | POA: Diagnosis not present

## 2023-09-30 LAB — RESP PANEL BY RT-PCR (RSV, FLU A&B, COVID)  RVPGX2
Influenza A by PCR: NEGATIVE
Influenza B by PCR: NEGATIVE
Resp Syncytial Virus by PCR: NEGATIVE
SARS Coronavirus 2 by RT PCR: NEGATIVE

## 2023-09-30 LAB — LIPASE, BLOOD: Lipase: 38 U/L (ref 11–51)

## 2023-09-30 LAB — HEPATIC FUNCTION PANEL
ALT: 17 U/L (ref 0–44)
AST: 22 U/L (ref 15–41)
Albumin: 3.8 g/dL (ref 3.5–5.0)
Alkaline Phosphatase: 74 U/L (ref 38–126)
Bilirubin, Direct: 0.2 mg/dL (ref 0.0–0.2)
Indirect Bilirubin: 0.4 mg/dL (ref 0.3–0.9)
Total Bilirubin: 0.6 mg/dL (ref 0.0–1.2)
Total Protein: 7.1 g/dL (ref 6.5–8.1)

## 2023-09-30 LAB — URINALYSIS, ROUTINE W REFLEX MICROSCOPIC
Bilirubin Urine: NEGATIVE
Glucose, UA: NEGATIVE mg/dL
Hgb urine dipstick: NEGATIVE
Ketones, ur: NEGATIVE mg/dL
Nitrite: NEGATIVE
Protein, ur: NEGATIVE mg/dL
Specific Gravity, Urine: 1.015 (ref 1.005–1.030)
pH: 6.5 (ref 5.0–8.0)

## 2023-09-30 LAB — BASIC METABOLIC PANEL
Anion gap: 12 (ref 5–15)
BUN: 7 mg/dL (ref 6–20)
CO2: 19 mmol/L — ABNORMAL LOW (ref 22–32)
Calcium: 8.6 mg/dL — ABNORMAL LOW (ref 8.9–10.3)
Chloride: 105 mmol/L (ref 98–111)
Creatinine, Ser: 0.7 mg/dL (ref 0.44–1.00)
GFR, Estimated: >60 mL/min (ref >60)
Glucose, Bld: 123 mg/dL — ABNORMAL HIGH (ref 70–99)
Potassium: 3.4 mmol/L — ABNORMAL LOW (ref 3.5–5.1)
Sodium: 136 mmol/L (ref 135–145)

## 2023-09-30 LAB — URINALYSIS, MICROSCOPIC (REFLEX)

## 2023-09-30 LAB — CBC
HCT: 37.2 % (ref 36.0–46.0)
Hemoglobin: 13.1 g/dL (ref 12.0–15.0)
MCH: 32.3 pg (ref 26.0–34.0)
MCHC: 35.2 g/dL (ref 30.0–36.0)
MCV: 91.6 fL (ref 80.0–100.0)
Platelets: 245 10*3/uL (ref 150–400)
RBC: 4.06 MIL/uL (ref 3.87–5.11)
RDW: 12.1 % (ref 11.5–15.5)
WBC: 7 10*3/uL (ref 4.0–10.5)
nRBC: 0 % (ref 0.0–0.2)

## 2023-09-30 LAB — TROPONIN I (HIGH SENSITIVITY): Troponin I (High Sensitivity): <2 ng/L (ref <18)

## 2023-09-30 MED ORDER — ALUM & MAG HYDROXIDE-SIMETH 200-200-20 MG/5ML PO SUSP
15.0000 mL | Freq: Once | ORAL | Status: AC
  Administered 2023-09-30: 15 mL via ORAL
  Filled 2023-09-30: qty 30

## 2023-09-30 MED ORDER — DICYCLOMINE HCL 20 MG PO TABS: 20.0000 mg | ORAL_TABLET | Freq: Two times a day (BID) | ORAL | 0 refills | Status: DC

## 2023-09-30 MED ORDER — ONDANSETRON HCL 4 MG/2ML IJ SOLN
4.0000 mg | Freq: Once | INTRAMUSCULAR | Status: AC
  Administered 2023-09-30: 4 mg via INTRAVENOUS
  Filled 2023-09-30: qty 2

## 2023-09-30 MED ORDER — DICYCLOMINE HCL 10 MG PO CAPS
10.0000 mg | ORAL_CAPSULE | Freq: Once | ORAL | Status: AC
  Administered 2023-09-30: 10 mg via ORAL
  Filled 2023-09-30: qty 1

## 2023-09-30 MED ORDER — PANTOPRAZOLE SODIUM 40 MG IV SOLR
40.0000 mg | Freq: Once | INTRAVENOUS | Status: AC
  Administered 2023-09-30: 40 mg via INTRAVENOUS
  Filled 2023-09-30: qty 10

## 2023-09-30 MED ORDER — MORPHINE SULFATE (PF) 4 MG/ML IV SOLN
4.0000 mg | Freq: Once | INTRAVENOUS | Status: AC
  Administered 2023-09-30: 4 mg via INTRAVENOUS
  Filled 2023-09-30: qty 1

## 2023-09-30 MED ORDER — FAMOTIDINE 20 MG PO TABS: 20.0000 mg | ORAL_TABLET | Freq: Two times a day (BID) | ORAL | 0 refills | Status: DC

## 2023-09-30 MED ORDER — ONDANSETRON 4 MG PO TBDP: 4.0000 mg | ORAL_TABLET | Freq: Three times a day (TID) | ORAL | 0 refills | Status: DC | PRN

## 2023-09-30 MED ORDER — IOHEXOL 300 MG/ML  SOLN
100.0000 mL | Freq: Once | INTRAMUSCULAR | Status: AC | PRN
  Administered 2023-09-30: 100 mL via INTRAVENOUS

## 2023-09-30 NOTE — ED Provider Notes (Signed)
 [facility] Provider Note   CSN: [id number] Arrival date & time: 09/30/23  1255     History  Chief Complaint  Patient presents with   Chest Pain   Near Syncope   Hip Pain    Linda [name] is a 48 y.o. female with history of migraines, heart murmur, anxiety, asthma, GERD, alcohol abuse, hypertension, who presents emergency department complaining of chest pain, tightness.  She states that she has been having a feeling of indigestion/reflux for the past 2 days.  Today she started having increased pain, nausea, shortness of breath.  States that she has been taking both prescribed and OTC meds for reflux without relief.  Pain is also generalized in the abdomen, waxes and wanes in intensity. Started with cough yesterday, feels like she "can't get the mucus up". Tried some mucinex without relief.   She is complaining of headache, after she had a near syncopal episode about 4 days ago and hit her head.  Having some left-sided hip pain with this as well with bruising.  Took prescribed Imitrex at 0430 today, without relief.  Hx Roux-en-y gastric bypass , no other abdominal surgeries   Chest Pain Associated symptoms: abdominal pain, cough, nausea and near-syncope   Associated symptoms: no fever and no vomiting   Near Syncope Associated symptoms include chest pain and abdominal pain.  Hip Pain Associated symptoms include chest pain and abdominal pain.       Home Medications Prior to Admission medications   Medication Sig Start Date End Date Taking? Authorizing Provider  dicyclomine (BENTYL) 20 MG tablet Take 1 tablet (20 mg total) by mouth 2 (two) times daily. 09/30/23  Yes Joyclyn Plazola T, PA-C  famotidine (PEPCID) 20 MG tablet Take 1 tablet (20 mg total) by mouth 2 (two) times daily. 09/30/23  Yes Markeis Allman T, PA-C  ondansetron (ZOFRAN-ODT) 4 MG disintegrating tablet Take 1 tablet (4 mg total) by mouth every 8 (eight) hours as  needed for nausea or vomiting. 09/30/23  Yes Retia Cordle T, PA-C  albuterol (VENTOLIN HFA) 108 (90 Base) MCG/ACT inhaler Inhale 2 puffs into the lungs every 6 (six) hours as needed for wheezing or shortness of breath. 09/19/23   Lorre Munroe, NP  ALPRAZolam Prudy Feeler) 0.5 MG tablet TAKE 1 TABLET BY MOUTH EVERY DAY AS NEEDED FOR ANXIETY 09/13/23   Lorre Munroe, NP  amLODipine (NORVASC) 5 MG tablet TAKE 1 TABLET (5 MG TOTAL) BY MOUTH DAILY. 05/02/23   Lorre Munroe, NP  amphetamine-dextroamphetamine (ADDERALL XR) 20 MG 24 hr capsule Take 1 capsule (20 mg total) by mouth every morning. 08/24/23   Lorre Munroe, NP  ondansetron (ZOFRAN) 4 MG tablet Take 1 tablet (4 mg total) by mouth every 8 (eight) hours as needed for nausea or vomiting. 05/15/23   Claiborne Rigg, NP  SUMAtriptan (IMITREX) 100 MG tablet Take 1 tablet (100 mg total) by mouth every 2 (two) hours as needed for migraine. May repeat in 2 hours if headache persists or recurs. 07/04/23   Lorre Munroe, NP      Allergies    Bacitra-neomycin-polymyxin-hc    Review of Systems   Review of Systems  Constitutional:  Negative for fever.  Respiratory:  Positive for cough.   Cardiovascular:  Positive for chest pain and near-syncope.  Gastrointestinal:  Positive for abdominal pain and nausea. Negative for diarrhea and vomiting.  Genitourinary:  Negative for dysuria, vaginal bleeding and vaginal discharge.  All  other systems reviewed and are negative.   Physical Exam Updated Vital Signs BP (!) 165/105   Pulse 65   Temp 97.7 F (36.5 C) (Oral)   Resp 16   Ht 5\' 9"  (1.753 m)   Wt 88.5 kg   LMP 07/12/2011   SpO2 100%   BMI 28.80 kg/m  Physical Exam Vitals and nursing note reviewed.  Constitutional:      Appearance: Normal appearance.  HENT:     Head: Normocephalic and atraumatic.  Eyes:     Conjunctiva/sclera: Conjunctivae normal.  Cardiovascular:     Rate and Rhythm: Normal rate and regular rhythm.  Pulmonary:      Effort: Pulmonary effort is normal. No respiratory distress.     Breath sounds: Normal breath sounds.  Abdominal:     General: There is no distension.     Palpations: Abdomen is soft.     Tenderness: There is generalized abdominal tenderness and tenderness in the epigastric area and left upper quadrant. There is no guarding or rebound.  Musculoskeletal:     Comments: Ecchymoses and tenderness over left lateral hip  Skin:    General: Skin is warm and dry.  Neurological:     General: No focal deficit present.     Mental Status: She is alert.     ED Results / Procedures / Treatments   Labs (all labs ordered are listed, but only abnormal results are displayed) Labs Reviewed  BASIC METABOLIC PANEL - Abnormal; Notable for the following components:      Result Value   Potassium 3.4 (*)    CO2 19 (*)    Glucose, Bld 123 (*)    Calcium 8.6 (*)    All other components within normal limits  URINALYSIS, ROUTINE W REFLEX MICROSCOPIC - Abnormal; Notable for the following components:   Leukocytes,Ua SMALL (*)    All other components within normal limits  URINALYSIS, MICROSCOPIC (REFLEX) - Abnormal; Notable for the following components:   Bacteria, UA RARE (*)    All other components within normal limits  RESP PANEL BY RT-PCR (RSV, FLU A&B, COVID)  RVPGX2  CBC  HEPATIC FUNCTION PANEL  LIPASE, BLOOD  TROPONIN I (HIGH SENSITIVITY)    EKG EKG Interpretation Date/Time:  Friday September 30 2023 13:12:39 EDT Ventricular Rate:  90 PR Interval:  135 QRS Duration:  82 QT Interval:  344 QTC Calculation: 421 R Axis:   8  Text Interpretation: Sinus rhythm Confirmed by Alona Bene [phone]) on 09/30/2023 1:22:26 PM  Radiology CT ABDOMEN PELVIS W CONTRAST Result Date: 09/30/2023 CLINICAL DATA:  Acute abdominal and atypical chest pain.  Nausea. EXAM: CT ABDOMEN AND PELVIS WITH CONTRAST TECHNIQUE: Multidetector CT imaging of the abdomen and pelvis was performed using the standard protocol following  bolus administration of intravenous contrast. RADIATION DOSE REDUCTION: This exam was performed according to the departmental dose-optimization program which includes automated exposure control, adjustment of the mA and/or kV according to patient size and/or use of iterative reconstruction technique. CONTRAST:  OMNIPAQUE IOHEXOL 300 MG/ML  SOLN COMPARISON:  09/02/2015 FINDINGS: Lower Chest: No acute findings. Hepatobiliary: No suspicious hepatic masses identified. Gallbladder is unremarkable. No evidence of biliary ductal dilatation. Pancreas:  No mass or inflammatory changes. Spleen: Within normal limits in size and appearance. Adrenals/Urinary Tract: No suspicious masses identified. No evidence of ureteral calculi or hydronephrosis. Stomach/Bowel: Prior gastric bypass surgery noted. No evidence of obstruction, inflammatory process or abnormal fluid collections. Normal appendix visualized. Vascular/Lymphatic: No pathologically enlarged lymph nodes. No  acute vascular findings. Reproductive: Prior hysterectomy noted. Adnexal regions are unremarkable in appearance. Other:  None. Musculoskeletal: No suspicious bone lesions identified. Localized area of soft tissue stranding is seen within the subcutaneous fat overlying the left hip. This is nonspecific but could be due to contusion or cellulitis. IMPRESSION: No acute intra-abdominal or intrapelvic abnormality. Localized area of soft tissue stranding within the subcutaneous fat overlying the left hip. This is nonspecific, and differential considerations due to contusion, cellulitis, or edema. Electronically Signed   By: Danae Orleans M.D.   On: 09/30/2023 18:54   DG Hip Unilat With Pelvis 2-3 Views Left Result Date: 09/30/2023 CLINICAL DATA:  Pain after fall. EXAM: DG HIP (WITH OR WITHOUT PELVIS) 2-3V LEFT COMPARISON:  None Available. FINDINGS: No acute fracture of the pelvis or left hip. Femoral head is normally seated, no dislocation. Hip joint spaces  preserved. Pubic rami are intact. Pubic symphysis and sacroiliac joints are congruent. No focal bone abnormality. Unremarkable soft tissues. IMPRESSION: Negative radiographs of the pelvis and left hip. Electronically Signed   By: Narda Rutherford M.D.   On: 09/30/2023 15:28   DG Chest 2 View Result Date: 09/30/2023 CLINICAL DATA:  Chest pain. EXAM: CHEST - 2 VIEW COMPARISON:  02/08/2020 FINDINGS: The cardiomediastinal contours are normal. The lungs are clear. Pulmonary vasculature is normal. No consolidation, pleural effusion, or pneumothorax. No acute osseous abnormalities are seen. IMPRESSION: No active cardiopulmonary disease. Electronically Signed   By: Narda Rutherford M.D.   On: 09/30/2023 15:27    Procedures Procedures    Medications Ordered in ED Medications  dicyclomine (BENTYL) capsule 10 mg (has no administration in time range)  alum & mag hydroxide-simeth (MAALOX/MYLANTA) 200-200-20 MG/5ML suspension 15 mL (has no administration in time range)  pantoprazole (PROTONIX) injection 40 mg (40 mg Intravenous Given 09/30/23 1541)  iohexol (OMNIPAQUE) 300 MG/ML solution 100 mL (100 mLs Intravenous Contrast Given 09/30/23 1723)  morphine (PF) 4 MG/ML injection 4 mg (4 mg Intravenous Given 09/30/23 1843)  ondansetron (ZOFRAN) injection 4 mg (4 mg Intravenous Given 09/30/23 1842)    ED Course/ Medical Decision Making/ A&P             HEART Score: 2                    Medical Decision Making Amount and/or Complexity of Data Reviewed Labs: ordered. Radiology: ordered.  Risk Prescription drug management.  This patient is a 48 y.o. female  who presents to the ED for concern of chest and abdominal pain.   Differential diagnoses prior to evaluation: The emergent differential diagnosis includes, but is not limited to,  PUD, gastritis, pancreatitis, gastroparesis, malignancy, biliary disease, ACS, pericarditis, pneumonia, intestinal ischemia, esophageal rupture, hepatitis. This is not an  exhaustive differential.   Past Medical History / Co-morbidities / Social History: migraines, heart murmur, anxiety, asthma, GERD, alcohol abuse, hypertension  Physical Exam: Physical exam performed. The pertinent findings include: Hypertensive, otherwise normal vital signs.  No acute distress.  Abdomen soft with some epigastric and left upper quadrant tenderness, no rebound or guarding.  Ecchymoses and tenderness to palpation of the left lateral hip.  Lab Tests/Imaging studies: I personally interpreted labs/imaging and the pertinent results include: Normal CBC.  Potassium 3.4, CO2 19, otherwise BMP grossly unremarkable.  Negative troponin.  Normal lipase.  UA with small leukocytes, rare bacteria, but no reportable urinary symptoms.  Respiratory panel negative.  Chest x-ray and left hip x-ray unremarkable.  CT abdomen pelvis with no acute  intra-abdominal finding.  Some soft tissue stranding over the left hip, consistent with recent injury. I agree with the radiologist interpretation.  Cardiac monitoring: EKG obtained and interpreted by myself and attending physician which shows: NSR   Medications: I ordered medication including protonix, Zofran, morphine, Maalox, Bentyl.  I have reviewed the patients home medicines and have made adjustments as needed.   Disposition: After consideration of the diagnostic results and the patients response to treatment, I feel that emergency department workup does not suggest an emergent condition requiring admission or immediate intervention beyond what has been performed at this time. Patient is to be discharged with recommendation to follow up with PCP in regards to today's hospital visit. Chest pain is not likely of cardiac or pulmonary etiology d/t presentation, Well's criteria for PE low risk, VSS, no tracheal deviation, no JVD or new murmur, RRR, breath sounds equal bilaterally, EKG without acute abnormalities, negative troponin, and negative CXR. Heart score  of 2.  Symptoms seem most consistent with GI origin, especially with history of severe reflux.  Will send prescriptions for Bentyl, Pepcid, and Zofran.  Patient has a follow-up with her PCP next week and plans to discuss her symptoms.  Pt has been advised to return to the ED if CP becomes exertional, associated with diaphoresis or nausea, radiates to left jaw/arm, worsens or becomes concerning in any way. Pt appears reliable for follow up and is agreeable to discharge.   Final Clinical Impression(s) / ED Diagnoses Final diagnoses:  Abdominal cramping  Nonspecific chest pain    Rx / DC Orders ED Discharge Orders          Ordered    dicyclomine (BENTYL) 20 MG tablet  2 times daily        09/30/23 1906    ondansetron (ZOFRAN-ODT) 4 MG disintegrating tablet  Every 8 hours PRN        09/30/23 1906    famotidine (PEPCID) 20 MG tablet  2 times daily        09/30/23 1906           Portions of this report may have been transcribed using voice recognition software. Every effort was made to ensure accuracy; however, inadvertent computerized transcription errors may be present.    Jeanella Flattery 09/30/23 1915    Glyn Ade, MD 09/30/23 2041

## 2023-09-30 NOTE — Discharge Instructions (Signed)
 You were seen in the emergency department today for chest and abdominal pain.  Your blood work looked very reassuring.  Your CT scan did not show any emergent findings.  I suspect that your pain could be related to your intestines.  I have written you prescriptions for an acid reducing medicine for your stomach, Zofran for nausea, and medicine called Bentyl for intestinal cramping.  I recommend trying a bland diet with mainly clear liquids for the next few days, until you can follow-up with your primary doctor.  Continue to monitor how you're doing and return to the ER for new or worsening symptoms.

## 2023-09-30 NOTE — ED Triage Notes (Signed)
 Pt POV steady gait- reports feeling of reflux x 2 days, increased CP today with tightness, nausea, ShOB. Been taking prescribed and OTC meds for reflux with no relief.   C/o headache after near syncopal fall 4 days ago, associated L hip pain.   Took imitrex appx 0430 today, no relief.

## 2023-10-04 ENCOUNTER — Ambulatory Visit: Admitting: Internal Medicine

## 2023-10-04 ENCOUNTER — Encounter: Payer: Self-pay | Admitting: Internal Medicine

## 2023-10-04 VITALS — BP 142/84 | Ht 69.0 in | Wt 187.6 lb

## 2023-10-04 DIAGNOSIS — G43109 Migraine with aura, not intractable, without status migrainosus: Secondary | ICD-10-CM

## 2023-10-04 DIAGNOSIS — Z6827 Body mass index (BMI) 27.0-27.9, adult: Secondary | ICD-10-CM

## 2023-10-04 DIAGNOSIS — E782 Mixed hyperlipidemia: Secondary | ICD-10-CM

## 2023-10-04 DIAGNOSIS — E663 Overweight: Secondary | ICD-10-CM

## 2023-10-04 DIAGNOSIS — J0100 Acute maxillary sinusitis, unspecified: Secondary | ICD-10-CM

## 2023-10-04 DIAGNOSIS — F900 Attention-deficit hyperactivity disorder, predominantly inattentive type: Secondary | ICD-10-CM

## 2023-10-04 DIAGNOSIS — I10 Essential (primary) hypertension: Secondary | ICD-10-CM

## 2023-10-04 DIAGNOSIS — R739 Hyperglycemia, unspecified: Secondary | ICD-10-CM | POA: Diagnosis not present

## 2023-10-04 DIAGNOSIS — K219 Gastro-esophageal reflux disease without esophagitis: Secondary | ICD-10-CM

## 2023-10-04 DIAGNOSIS — F419 Anxiety disorder, unspecified: Secondary | ICD-10-CM

## 2023-10-04 DIAGNOSIS — F32A Depression, unspecified: Secondary | ICD-10-CM

## 2023-10-04 MED ORDER — SUMATRIPTAN SUCCINATE 100 MG PO TABS: 100.0000 mg | ORAL_TABLET | ORAL | 2 refills | Status: DC | PRN

## 2023-10-04 MED ORDER — AMLODIPINE-OLMESARTAN 5-20 MG PO TABS: 1.0000 | ORAL_TABLET | Freq: Every day | ORAL | 0 refills | Status: DC

## 2023-10-04 MED ORDER — AMPHETAMINE-DEXTROAMPHET ER 20 MG PO CP24: 20.0000 mg | ORAL_CAPSULE | ORAL | 0 refills | Status: DC

## 2023-10-04 MED ORDER — AMOXICILLIN-POT CLAVULANATE 875-125 MG PO TABS: 1.0000 | ORAL_TABLET | Freq: Two times a day (BID) | ORAL | 0 refills | Status: DC

## 2023-10-04 MED ORDER — FLUCONAZOLE 150 MG PO TABS: 150.0000 mg | ORAL_TABLET | Freq: Once | ORAL | 0 refills | Status: AC

## 2023-10-04 MED ORDER — LANSOPRAZOLE 30 MG PO CPDR: 30.0000 mg | DELAYED_RELEASE_CAPSULE | Freq: Every day | ORAL | 1 refills | Status: DC

## 2023-10-04 MED ORDER — ALPRAZOLAM 0.5 MG PO TABS: ORAL_TABLET | ORAL | 0 refills | Status: DC

## 2023-10-04 NOTE — Assessment & Plan Note (Signed)
 Avoid triggers if able Continue imitrex and phenergan as needed

## 2023-10-04 NOTE — Assessment & Plan Note (Signed)
 Encouraged diet and exercise for weight loss ?

## 2023-10-04 NOTE — Assessment & Plan Note (Signed)
Continue xanax as needed Support offered

## 2023-10-04 NOTE — Assessment & Plan Note (Signed)
 Avoid foods that trigger reflux Encourage weight loss as this cannot reduce reflux symptoms Okay to continue prevacid OTC as needed

## 2023-10-04 NOTE — Assessment & Plan Note (Signed)
 Uncontrolled on amlodipine, will discontinue Rx for amlodipine-olmesartan 5-20 mg daily Reinforced DASH diet and exercise for weight loss C-Met today

## 2023-10-04 NOTE — Assessment & Plan Note (Signed)
 Continue adderall

## 2023-10-04 NOTE — Progress Notes (Signed)
 Subjective:    Patient ID: Linda [name], female    DOB: 12/30/1975, 48 y.o.   MRN: [id number]  HPI  Patient presents to clinic today for follow-up of chronic conditions.  Migraines: These occur 3-4 times a month.  Triggered by smells, computer screens.  She takes imitrex and phenergan as needed with some relief of symptoms.  She does not follow with neurology.  HTN: Her BP today is 148/90.  She is taking amlodipine as prescribed.  ECG from 01/2020 reviewed.  GERD: Status post gastric bypass.  She takes prevacid OTC with some relief of symptoms. She has been on esomeprazole in the past. She stopped this because she felt like it was ineffective. There is no upper GI on file.  HLD: Her last LDL was 90, triglycerides 284, 02/2023.  She is not taking any cholesterol-lowering medication at this time.  She does not consume low-fat diet.  Anxiety and depression: Chronic, managed on xanax as needed.  She is currently seeing a therapist.  She denies SI/HI.  ADD: She reports mainly inattention. Managed with adderall.  She does not follow with psychiatry.  She also reports sinus pressure and post nasal drip.  This started 2 weeks ago.  She has slight nasal congestion but denies runny nose, ear pain, sore throat or cough.  She denies fever, chills and body aches.  She has tried mucinex and afrin OTC with minimal relief of symptoms.  Review of Systems     Past Medical History:  Diagnosis Date   Alcohol abuse    Anxiety    Asthma    GERD (gastroesophageal reflux disease)    Heart murmur    while pregnant   Hypertension    Migraine     Current Outpatient Medications  Medication Sig Dispense Refill   albuterol (VENTOLIN HFA) 108 (90 Base) MCG/ACT inhaler Inhale 2 puffs into the lungs every 6 (six) hours as needed for wheezing or shortness of breath. 18 g 0   ALPRAZolam (XANAX) 0.5 MG tablet TAKE 1 TABLET BY MOUTH EVERY DAY AS NEEDED FOR ANXIETY 20 tablet 0   amLODipine (NORVASC) 5 MG  tablet TAKE 1 TABLET (5 MG TOTAL) BY MOUTH DAILY. 90 tablet 1   amphetamine-dextroamphetamine (ADDERALL XR) 20 MG 24 hr capsule Take 1 capsule (20 mg total) by mouth every morning. 30 capsule 0   ondansetron (ZOFRAN) 4 MG tablet Take 1 tablet (4 mg total) by mouth every 8 (eight) hours as needed for nausea or vomiting. 40 tablet 0   SUMAtriptan (IMITREX) 100 MG tablet Take 1 tablet (100 mg total) by mouth every 2 (two) hours as needed for migraine. May repeat in 2 hours if headache persists or recurs. 10 tablet 2   No current facility-administered medications for this visit.    Allergies  Allergen Reactions   Bacitra-Neomycin-Polymyxin-Hc Other (See Comments)    Pain and swelling of ear canal    Family History  Problem Relation Age of Onset   Breast cancer Mother        38s   Hypertension Mother    Uterine cancer Mother    Stroke Mother    Heart attack Mother    Hypertension Father    Diabetes Father    Multiple sclerosis Father    Breast cancer Maternal Aunt    Diabetes Maternal Aunt    Diabetes Maternal Uncle    Cancer Maternal Grandmother        PANCREATIC   Hypertension Maternal Grandmother  Diabetes Maternal Grandmother    Cancer Maternal Grandfather        LUNG   Hypertension Maternal Grandfather    Diabetes Maternal Grandfather    Cancer Paternal Grandmother        THROAT   Alzheimer's disease Paternal Grandmother     Social History   Socioeconomic History   Marital status: Divorced    Spouse name: Not on file   Number of children: Not on file   Years of education: Not on file   Highest education level: Master's degree (e.g., MA, MS, MEng, MEd, MSW, MBA)  Occupational History   Not on file  Tobacco Use   Smoking status: Never   Smokeless tobacco: Never  Vaping Use   Vaping status: Never Used  Substance and Sexual Activity   Alcohol use: Yes    Comment: occ   Drug use: No   Sexual activity: Not on file  Other Topics Concern   Not on file  Social  History Narrative   Not on file   Social Drivers of Health   Financial Resource Strain: Low Risk  (02/28/2023)   Overall Financial Resource Strain (CARDIA)    Difficulty of Paying Living Expenses: Not hard at all  Food Insecurity: No Food Insecurity (02/28/2023)   Hunger Vital Sign    Worried About Running Out of Food in the Last Year: Never true    Ran Out of Food in the Last Year: Never true  Transportation Needs: No Transportation Needs (02/28/2023)   PRAPARE - Administrator, Civil Service (Medical): No    Lack of Transportation (Non-Medical): No  Physical Activity: Insufficiently Active (02/28/2023)   Exercise Vital Sign    Days of Exercise per Week: 3 days    Minutes of Exercise per Session: 20 min  Stress: Stress Concern Present (02/28/2023)   [employer] of Occupational Health - Occupational Stress Questionnaire    Feeling of Stress : To some extent  Social Connections: Moderately Integrated (02/28/2023)   Social Connection and Isolation Panel [NHANES]    Frequency of Communication with Friends and Family: More than three times a week    Frequency of Social Gatherings with Friends and Family: Once a week    Attends Religious Services: More than 4 times per year    Active Member of [employer] or Organizations: Yes    Attends Banker Meetings: More than 4 times per year    Marital Status: Divorced  Intimate Partner Violence: Not At Risk (03/16/2022)   Humiliation, Afraid, Rape, and Kick questionnaire    Fear of Current or Ex-Partner: No    Emotionally Abused: No    Physically Abused: No    Sexually Abused: No     Constitutional: Patient reports intermittent headaches.  Denies fever, malaise, fatigue, or abrupt weight changes.  HEENT: Patient reports sinus pain, nasal congestion.  Denies eye pain, eye redness, ear pain, ringing in the ears, wax buildup, runny nose,  bloody nose, or sore throat. Respiratory: Denies difficulty breathing, shortness of  breath, cough or sputum production.   Cardiovascular: Denies chest pain, chest tightness, palpitations or swelling in the hands or feet.  Gastrointestinal: Patient reports intermittent reflux.  Denies abdominal pain, bloating, constipation, diarrhea or blood in the stool.  GU: Denies urgency, frequency, pain with urination, burning sensation, blood in urine, odor or discharge. Musculoskeletal: Denies decrease in range of motion, difficulty with gait, muscle pain or joint pain and swelling.  Skin: Denies redness, rashes, lesions or  ulcercations.  Neurological: Patient reports inattention.  Denies dizziness, difficulty with memory, difficulty with speech or problems with balance and coordination.  Psych: Patient has a history of anxiety and depression denies SI/HI.  No other specific complaints in a complete review of systems (except as listed in HPI above).  Objective:   Physical Exam BP (!) 142/84   Ht 5\' 9"  (1.753 m)   Wt 187 lb 9.6 oz (85.1 kg)   LMP 07/12/2011   BMI 27.70 kg/m    Wt Readings from Last 3 Encounters:  03/02/23 188 lb (85.3 kg)  01/23/23 190 lb (86.2 kg)  08/11/22 190 lb (86.2 kg)    General: Appears her stated age, overweight, in NAD. Skin: Warm, dry and intact. HEENT: Head: normal shape and size, maxillary sinus pressure noted; Eyes: sclera white, no icterus, conjunctiva pink, PERRLA and EOMs intact; Nose: Mucosa boggy and moist, turbinates swollen. Neck: No adenopathy noted but tender with palpation. Cardiovascular: Normal rate and rhythm. S1,S2 noted.  No murmur, rubs or gallops noted. No JVD or BLE edema. Pulmonary/Chest: Normal effort and positive vesicular breath sounds. No respiratory distress. No wheezes, rales or ronchi noted.  Abdomen: Soft and nontender. Normal bowel sounds.  Musculoskeletal: No difficulty with gait.  Neurological: Alert and oriented. Coordination normal.  Psychiatric: Mood and affect normal. Behavior is normal. Judgment and thought  content normal.     BMET    Component Value Date/Time   NA 140 03/02/2023 0839   NA 137 05/31/2013 0846   K 4.0 03/02/2023 0839   K 3.8 05/31/2013 0846   CL 106 03/02/2023 0839   CL 106 05/31/2013 0846   CO2 25 03/02/2023 0839   CO2 26 05/31/2013 0846   GLUCOSE 79 03/02/2023 0839   GLUCOSE 93 05/31/2013 0846   BUN 10 03/02/2023 0839   BUN 10 05/31/2013 0846   CREATININE 0.82 03/02/2023 0839   CALCIUM 8.9 03/02/2023 0839   CALCIUM 8.2 (L) 05/31/2013 0846   GFRNONAA >60 01/23/2023 1617   GFRNONAA >60 05/31/2013 0846   GFRAA >60 02/08/2020 0600   GFRAA >60 05/31/2013 0846    Lipid Panel     Component Value Date/Time   CHOL 208 (H) 03/02/2023 0839   TRIG 141 03/02/2023 0839   HDL 94 03/02/2023 0839   CHOLHDL 2.2 03/02/2023 0839   VLDL 35.2 05/17/2019 1606   LDLCALC 90 03/02/2023 0839    CBC    Component Value Date/Time   WBC 6.5 03/02/2023 0839   RBC 4.35 03/02/2023 0839   HGB 14.0 03/02/2023 0839   HGB 13.3 05/31/2013 0846   HCT 41.6 03/02/2023 0839   HCT 39.2 05/31/2013 0846   PLT 286 03/02/2023 0839   PLT 219 05/31/2013 0846   MCV 95.6 03/02/2023 0839   MCV 88 05/31/2013 0846   MCH 32.2 03/02/2023 0839   MCHC 33.7 03/02/2023 0839   RDW 12.9 03/02/2023 0839   RDW 12.8 05/31/2013 0846   LYMPHSABS 1.2 01/23/2023 1617   LYMPHSABS 2.1 05/31/2013 0846   MONOABS 0.3 01/23/2023 1617   MONOABS 0.4 05/31/2013 0846   EOSABS 0.1 01/23/2023 1617   EOSABS 0.1 05/31/2013 0846   BASOSABS 0.1 01/23/2023 1617   BASOSABS 0.1 05/31/2013 0846    Hgb A1C Lab Results  Component Value Date   HGBA1C 5.6 03/02/2023           Assessment & Plan:   Acute maxillary sinusitis:  Can use a neti pot which can be purchased from your local pharmacy  Stop mucinex and afrin and switch to zyrtec and flonase OTC Rx for augmentin 875-125 mg twice daily x 10 days Rx for diflucan 150 mg p.o. x 1 for antibiotic induced yeast infection  RTC in 2 weeks follow-up HTN, 6 months  for your annual exam Nicki Reaper, NP

## 2023-10-04 NOTE — Assessment & Plan Note (Signed)
 C-Met and lipid profile today Encouraged her to consume low-fat diet

## 2023-10-04 NOTE — Patient Instructions (Signed)
 How to Take Your Blood Pressure Blood pressure measures how strongly your blood is pressing against the walls of your arteries. Arteries are blood vessels that carry blood from your heart throughout your body. You can take your blood pressure at home with a machine. You may need to check your blood pressure at home: To check if you have high blood pressure (hypertension). To check your blood pressure over time. To make sure your blood pressure medicine is working. Supplies needed: Blood pressure machine, or monitor. A chair to sit in. This should be a chair where you can sit upright with your back supported. Do not sit on a soft couch or an armchair. Table or desk. Small notebook. Pencil or pen. How to prepare Avoid these things for 30 minutes before checking your blood pressure: Having drinks with caffeine in them, such as coffee or tea. Drinking alcohol. Eating. Smoking. Exercising. Do these things five minutes before checking your blood pressure: Go to the bathroom and pee (urinate). Sit in a chair. Be quiet. Do not talk. How to take your blood pressure Follow the instructions that came with your machine. If you have a digital blood pressure monitor, these may be the instructions: Sit up straight. Place your feet on the floor. Do not cross your ankles or legs. Rest your left arm at the level of your heart. You may rest it on a table, desk, or chair. Pull up your shirt sleeve. Wrap the blood pressure cuff around the upper part of your left arm. The cuff should be 1 inch (2.5 cm) above your elbow. It is best to wrap the cuff around bare skin. Fit the cuff snugly around your arm, but not too tightly. You should be able to place only one finger between the cuff and your arm. Place the cord so that it rests in the bend of your elbow. Press the power button. Sit quietly while the cuff fills with air and loses air. Write down the numbers on the screen. Wait 2-3 minutes and then repeat  steps 1-10. What do the numbers mean? Two numbers make up your blood pressure. The first number is called systolic pressure. The second is called diastolic pressure. An example of a blood pressure reading is "120 over 80" (or 120/80). If you are an adult and do not have a medical condition, use this guide to find out if your blood pressure is normal: Normal First number: below 120. Second number: below 80. Elevated First number: 120-129. Second number: below 80. Hypertension stage 1 First number: 130-139. Second number: 80-89. Hypertension stage 2 First number: 140 or above. Second number: 90 or above. Your blood pressure is above normal even if only the first or only the second number is above normal. Follow these instructions at home: Medicines Take over-the-counter and prescription medicines only as told by your doctor. Tell your doctor if your medicine is causing side effects. General instructions Check your blood pressure as often as your doctor tells you to. Check your blood pressure at the same time every day. Take your monitor to your next doctor's appointment. Your doctor will: Make sure you are using it correctly. Make sure it is working right. Understand what your blood pressure numbers should be. Keep all follow-up visits. General tips You will need a blood pressure machine or monitor. Your doctor can suggest a monitor. You can buy one at a drugstore or online. When choosing one: Choose one with an arm cuff. Choose one that wraps around your  upper arm. Only one finger should fit between your arm and the cuff. Do not choose one that measures your blood pressure from your wrist or finger. Where to find more information American Heart Association: www.heart.org Contact a doctor if: Your blood pressure keeps being high. Your blood pressure is suddenly low. Get help right away if: Your first blood pressure number is higher than 180. Your second blood pressure number is  higher than 120. These symptoms may be an emergency. Do not wait to see if the symptoms will go away. Get help right away. Call 911. Summary Check your blood pressure at the same time every day. Avoid caffeine, alcohol, smoking, and exercise for 30 minutes before checking your blood pressure. Make sure you understand what your blood pressure numbers should be. This information is not intended to replace advice given to you by your health care provider. Make sure you discuss any questions you have with your health care provider. Document Revised: 03/12/2021 Document Reviewed: 03/12/2021 Elsevier Patient Education  2024 [employer].

## 2023-10-05 ENCOUNTER — Encounter: Payer: Self-pay | Admitting: Internal Medicine

## 2023-10-05 LAB — CBC
HCT: 40.7 % (ref 35.0–45.0)
Hemoglobin: 13.5 g/dL (ref 11.7–15.5)
MCH: 31.6 pg (ref 27.0–33.0)
MCHC: 33.2 g/dL (ref 32.0–36.0)
MCV: 95.3 fL (ref 80.0–100.0)
MPV: 10.2 fL (ref 7.5–12.5)
Platelets: 252 Thousand/uL (ref 140–400)
RBC: 4.27 Million/uL (ref 3.80–5.10)
RDW: 12.4 % (ref 11.0–15.0)
WBC: 7.3 Thousand/uL (ref 3.8–10.8)

## 2023-10-05 LAB — LIPID PANEL
Cholesterol: 252 mg/dL — ABNORMAL HIGH
HDL: 116 mg/dL
LDL Cholesterol (Calc): 105 mg/dL — ABNORMAL HIGH
Non-HDL Cholesterol (Calc): 136 mg/dL — ABNORMAL HIGH
Total CHOL/HDL Ratio: 2.2 (calc)
Triglycerides: 194 mg/dL — ABNORMAL HIGH

## 2023-10-05 LAB — COMPLETE METABOLIC PANEL WITH GFR
AG Ratio: 1.6 (calc) (ref 1.0–2.5)
ALT: 16 U/L (ref 6–29)
AST: 20 U/L (ref 10–35)
Albumin: 4.5 g/dL (ref 3.6–5.1)
Alkaline phosphatase (APISO): 86 U/L (ref 31–125)
BUN: 12 mg/dL (ref 7–25)
CO2: 26 mmol/L (ref 20–32)
Calcium: 8.8 mg/dL (ref 8.6–10.2)
Chloride: 105 mmol/L (ref 98–110)
Creat: 0.76 mg/dL (ref 0.50–0.99)
Globulin: 2.8 g/dL (ref 1.9–3.7)
Glucose, Bld: 85 mg/dL (ref 65–99)
Potassium: 4.2 mmol/L (ref 3.5–5.3)
Sodium: 140 mmol/L (ref 135–146)
Total Bilirubin: 0.3 mg/dL (ref 0.2–1.2)
Total Protein: 7.3 g/dL (ref 6.1–8.1)

## 2023-10-05 LAB — HEMOGLOBIN A1C
Hgb A1c MFr Bld: 5.6 %{Hb}
Mean Plasma Glucose: 114 mg/dL
eAG (mmol/L): 6.3 mmol/L

## 2023-10-12 MED ORDER — AMLODIPINE-OLMESARTAN 5-20 MG PO TABS: 1.0000 | ORAL_TABLET | Freq: Every day | ORAL | 0 refills | Status: DC

## 2023-10-12 MED ORDER — ATORVASTATIN CALCIUM 10 MG PO TABS: 10.0000 mg | ORAL_TABLET | Freq: Every day | ORAL | 1 refills | Status: DC

## 2023-10-21 ENCOUNTER — Ambulatory Visit: Admitting: Internal Medicine

## 2023-10-21 NOTE — Progress Notes (Deleted)
 Subjective:    Patient ID: Linda [name], female    DOB: Nov 14, 1975, 48 y.o.   MRN: [id number]  HPI  Patient presents to clinic today for 2-week follow-up of HTN.  At her last visit her amlodipine was changed to amlodipine-olmesartan 5-20 mg daily.  She has been taking the medication as prescribed.  Her BP today is.  ECG from 01/2020 reviewed.  Review of Systems     Past Medical History:  Diagnosis Date   Alcohol abuse    Anxiety    Asthma    GERD (gastroesophageal reflux disease)    Heart murmur    while pregnant   Hypertension    Migraine     Current Outpatient Medications  Medication Sig Dispense Refill   albuterol (VENTOLIN HFA) 108 (90 Base) MCG/ACT inhaler Inhale 2 puffs into the lungs every 6 (six) hours as needed for wheezing or shortness of breath. 18 g 0   ALPRAZolam (XANAX) 0.5 MG tablet TAKE 1 TABLET BY MOUTH EVERY DAY AS NEEDED FOR ANXIETY 20 tablet 0   amLODipine (NORVASC) 5 MG tablet TAKE 1 TABLET (5 MG TOTAL) BY MOUTH DAILY. 90 tablet 1   amphetamine-dextroamphetamine (ADDERALL XR) 20 MG 24 hr capsule Take 1 capsule (20 mg total) by mouth every morning. 30 capsule 0   ondansetron (ZOFRAN) 4 MG tablet Take 1 tablet (4 mg total) by mouth every 8 (eight) hours as needed for nausea or vomiting. 40 tablet 0   SUMAtriptan (IMITREX) 100 MG tablet Take 1 tablet (100 mg total) by mouth every 2 (two) hours as needed for migraine. May repeat in 2 hours if headache persists or recurs. 10 tablet 2   No current facility-administered medications for this visit.    Allergies  Allergen Reactions   Bacitra-Neomycin-Polymyxin-Hc Other (See Comments)    Pain and swelling of ear canal    Family History  Problem Relation Age of Onset   Breast cancer Mother        78s   Hypertension Mother    Uterine cancer Mother    Stroke Mother    Heart attack Mother    Hypertension Father    Diabetes Father    Multiple sclerosis Father    Breast cancer Maternal Aunt    Diabetes  Maternal Aunt    Diabetes Maternal Uncle    Cancer Maternal Grandmother        PANCREATIC   Hypertension Maternal Grandmother    Diabetes Maternal Grandmother    Cancer Maternal Grandfather        LUNG   Hypertension Maternal Grandfather    Diabetes Maternal Grandfather    Cancer Paternal Grandmother        THROAT   Alzheimer's disease Paternal Grandmother     Social History   Socioeconomic History   Marital status: Divorced    Spouse name: Not on file   Number of children: Not on file   Years of education: Not on file   Highest education level: Master's degree (e.g., MA, MS, MEng, MEd, MSW, MBA)  Occupational History   Not on file  Tobacco Use   Smoking status: Never   Smokeless tobacco: Never  Vaping Use   Vaping status: Never Used  Substance and Sexual Activity   Alcohol use: Yes    Comment: occ   Drug use: No   Sexual activity: Not on file  Other Topics Concern   Not on file  Social History Narrative   Not on file  Social Drivers of Corporate investment banker Strain: Low Risk  (02/28/2023)   Overall Financial Resource Strain (CARDIA)    Difficulty of Paying Living Expenses: Not hard at all  Food Insecurity: No Food Insecurity (02/28/2023)   Hunger Vital Sign    Worried About Running Out of Food in the Last Year: Never true    Ran Out of Food in the Last Year: Never true  Transportation Needs: No Transportation Needs (02/28/2023)   PRAPARE - Administrator, Civil Service (Medical): No    Lack of Transportation (Non-Medical): No  Physical Activity: Insufficiently Active (02/28/2023)   Exercise Vital Sign    Days of Exercise per Week: 3 days    Minutes of Exercise per Session: 20 min  Stress: Stress Concern Present (02/28/2023)   [employer] of Occupational Health - Occupational Stress Questionnaire    Feeling of Stress : To some extent  Social Connections: Moderately Integrated (02/28/2023)   Social Connection and Isolation Panel [NHANES]     Frequency of Communication with Friends and Family: More than three times a week    Frequency of Social Gatherings with Friends and Family: Once a week    Attends Religious Services: More than 4 times per year    Active Member of [employer] or Organizations: Yes    Attends Banker Meetings: More than 4 times per year    Marital Status: Divorced  Intimate Partner Violence: Not At Risk (03/16/2022)   Humiliation, Afraid, Rape, and Kick questionnaire    Fear of Current or Ex-Partner: No    Emotionally Abused: No    Physically Abused: No    Sexually Abused: No     Constitutional: Patient reports intermittent headaches.  Denies fever, malaise, fatigue, or abrupt weight changes.  HEENT: Patient reports sinus pain, nasal congestion.  Denies eye pain, eye redness, ear pain, ringing in the ears, wax buildup, runny nose,  bloody nose, or sore throat. Respiratory: Denies difficulty breathing, shortness of breath, cough or sputum production.   Cardiovascular: Denies chest pain, chest tightness, palpitations or swelling in the hands or feet.  Gastrointestinal: Patient reports intermittent reflux.  Denies abdominal pain, bloating, constipation, diarrhea or blood in the stool.  GU: Denies urgency, frequency, pain with urination, burning sensation, blood in urine, odor or discharge. Musculoskeletal: Denies decrease in range of motion, difficulty with gait, muscle pain or joint pain and swelling.  Skin: Denies redness, rashes, lesions or ulcercations.  Neurological: Patient reports inattention.  Denies dizziness, difficulty with memory, difficulty with speech or problems with balance and coordination.  Psych: Patient has a history of anxiety and depression denies SI/HI.  No other specific complaints in a complete review of systems (except as listed in HPI above).  Objective:   Physical Exam LMP 07/12/2011    Wt Readings from Last 3 Encounters:  03/02/23 188 lb (85.3 kg)  01/23/23 190 lb  (86.2 kg)  08/11/22 190 lb (86.2 kg)    General: Appears her stated age, overweight, in NAD. Skin: Warm, dry and intact. HEENT: Head: normal shape and size, maxillary sinus pressure noted; Eyes: sclera white, no icterus, conjunctiva pink, PERRLA and EOMs intact; Nose: Mucosa boggy and moist, turbinates swollen. Neck: No adenopathy noted but tender with palpation. Cardiovascular: Normal rate and rhythm. S1,S2 noted.  No murmur, rubs or gallops noted. No JVD or BLE edema. Pulmonary/Chest: Normal effort and positive vesicular breath sounds. No respiratory distress. No wheezes, rales or ronchi noted.  Abdomen: Soft and nontender.  Normal bowel sounds.  Musculoskeletal: No difficulty with gait.  Neurological: Alert and oriented. Coordination normal.  Psychiatric: Mood and affect normal. Behavior is normal. Judgment and thought content normal.     BMET    Component Value Date/Time   NA 140 03/02/2023 0839   NA 137 05/31/2013 0846   K 4.0 03/02/2023 0839   K 3.8 05/31/2013 0846   CL 106 03/02/2023 0839   CL 106 05/31/2013 0846   CO2 25 03/02/2023 0839   CO2 26 05/31/2013 0846   GLUCOSE 79 03/02/2023 0839   GLUCOSE 93 05/31/2013 0846   BUN 10 03/02/2023 0839   BUN 10 05/31/2013 0846   CREATININE 0.82 03/02/2023 0839   CALCIUM 8.9 03/02/2023 0839   CALCIUM 8.2 (L) 05/31/2013 0846   GFRNONAA >60 01/23/2023 1617   GFRNONAA >60 05/31/2013 0846   GFRAA >60 02/08/2020 0600   GFRAA >60 05/31/2013 0846    Lipid Panel     Component Value Date/Time   CHOL 208 (H) 03/02/2023 0839   TRIG 141 03/02/2023 0839   HDL 94 03/02/2023 0839   CHOLHDL 2.2 03/02/2023 0839   VLDL 35.2 05/17/2019 1606   LDLCALC 90 03/02/2023 0839    CBC    Component Value Date/Time   WBC 6.5 03/02/2023 0839   RBC 4.35 03/02/2023 0839   HGB 14.0 03/02/2023 0839   HGB 13.3 05/31/2013 0846   HCT 41.6 03/02/2023 0839   HCT 39.2 05/31/2013 0846   PLT 286 03/02/2023 0839   PLT 219 05/31/2013 0846   MCV 95.6  03/02/2023 0839   MCV 88 05/31/2013 0846   MCH 32.2 03/02/2023 0839   MCHC 33.7 03/02/2023 0839   RDW 12.9 03/02/2023 0839   RDW 12.8 05/31/2013 0846   LYMPHSABS 1.2 01/23/2023 1617   LYMPHSABS 2.1 05/31/2013 0846   MONOABS 0.3 01/23/2023 1617   MONOABS 0.4 05/31/2013 0846   EOSABS 0.1 01/23/2023 1617   EOSABS 0.1 05/31/2013 0846   BASOSABS 0.1 01/23/2023 1617   BASOSABS 0.1 05/31/2013 0846    Hgb A1C Lab Results  Component Value Date   HGBA1C 5.6 03/02/2023           Assessment & Plan:     RTC in 6 months for your annual exam Nicki Reaper, NP

## 2023-10-30 ENCOUNTER — Other Ambulatory Visit: Payer: Self-pay | Admitting: Internal Medicine

## 2023-10-31 ENCOUNTER — Encounter: Payer: Self-pay | Admitting: Internal Medicine

## 2023-10-31 NOTE — Telephone Encounter (Signed)
 Requested medication (s) are due for refill today: yes  Requested medication (s) are on the active medication list: yes  Last refill:  10/04/23 #20  Future visit scheduled: yes  Notes to clinic:  med not delegated to NT to RF   Requested Prescriptions  Pending Prescriptions Disp Refills   ALPRAZolam  (XANAX ) 0.5 MG tablet [Pharmacy Med Name: ALPRAZOLAM  0.5 MG TABLET] 20 tablet 0    Sig: TAKE 1 TABLET BY MOUTH EVERY DAY AS NEEDED FOR ANXIETY     Not Delegated - Psychiatry: Anxiolytics/Hypnotics 2 Failed - 10/31/2023  3:26 PM      Failed - This refill cannot be delegated      Failed - Urine Drug Screen completed in last 360 days      Failed - Valid encounter within last 6 months    Recent Outpatient Visits           3 weeks ago Mixed hyperlipidemia   [place] [facility] [place], Rankin Buzzard, NP   8 years ago Sciatica of left side   [facility], Sable Cram, MD   8 years ago Recurrent acute suppurative otitis media without spontaneous rupture of left tympanic membrane   [facility], Gaylord Kearns, MD              Passed - Patient is not pregnant

## 2023-10-31 NOTE — Telephone Encounter (Signed)
 Can we please call the pharmacy and ask why the Xanax  was filled on 3/25 when there is a note to the pharmacy on the RX that clearly states to not fill until 10/13/22?

## 2023-11-04 ENCOUNTER — Other Ambulatory Visit: Payer: Self-pay | Admitting: Internal Medicine

## 2023-11-04 NOTE — Telephone Encounter (Signed)
 Requested Prescriptions  Pending Prescriptions Disp Refills   amLODipine -olmesartan  (AZOR ) 5-20 MG tablet [Pharmacy Med Name: AMLODIPINE -OLMESARTAN  5-20 MG] 90 tablet 1    Sig: TAKE 1 TABLET BY MOUTH EVERY DAY     Cardiovascular: CCB + ARB Combos Failed - 11/04/2023  4:37 PM      Failed - Last BP in normal range    BP Readings from Last 1 Encounters:  10/04/23 (!) 142/84         Failed - Valid encounter within last 6 months    Recent Outpatient Visits           1 month ago Mixed hyperlipidemia   [place] [facility] [place], [place] W, NP   8 years ago Sciatica of left side   [facility], Sable Cram, MD   8 years ago Recurrent acute suppurative otitis media without spontaneous rupture of left tympanic membrane   [facility], Gaylord Kearns, MD              Passed - K in normal range and within 180 days    Potassium  Date Value Ref Range Status  10/04/2023 4.2 3.5 - 5.3 mmol/L Final  05/31/2013 3.8 3.5 - 5.1 mmol/L Final         Passed - Cr in normal range and within 180 days    Creat  Date Value Ref Range Status  10/04/2023 0.76 0.50 - 0.99 mg/dL Final         Passed - Na in normal range and within 180 days    Sodium  Date Value Ref Range Status  10/04/2023 140 135 - 146 mmol/L Final  05/31/2013 137 136 - 145 mmol/L Final         Passed - Patient is not pregnant

## 2023-11-08 ENCOUNTER — Telehealth: Admitting: Family Medicine

## 2023-11-08 DIAGNOSIS — L089 Local infection of the skin and subcutaneous tissue, unspecified: Secondary | ICD-10-CM | POA: Diagnosis not present

## 2023-11-08 DIAGNOSIS — T148XXA Other injury of unspecified body region, initial encounter: Secondary | ICD-10-CM | POA: Diagnosis not present

## 2023-11-08 MED ORDER — CEPHALEXIN 500 MG PO CAPS: 500.0000 mg | ORAL_CAPSULE | Freq: Three times a day (TID) | ORAL | 0 refills | Status: AC

## 2023-11-08 NOTE — Progress Notes (Signed)
 E-Visit for Simple Cut/Laceration  We are sorry that you have had an injury. Here is how we plan to help!  Based on what you shared with me it looks like you have a simple laceration that does not need to be repaired with stitches or tissue glue.   I am sending keflex  to take TID for infection.   HOME CARE: Clean the cut or scrape - Wash it well with soap and water . * avoid using hydrogen peroxide which may cause tissue damage, or impede wound healing.  Stop the bleeding - If your cut or scrape is bleeding, press a clean cloth or bandage firmly on the area for 20 minutes. You can also help slow the bleeding by holding the cut above the level of your heart.   Put a thin layer of Bacitracin antibiotic ointment on the cut or scrape. (this can be purchased at any local pharmacy- ask your pharmacist if you need assistance)   Cover the cut or scrape with a bandage or gauze. Keep the bandage clean and dry. Change the bandage 1 to 2 times every day until your cut or scrape heals.   Watch for signs that your cut or scrape is infected (redness, drainage, pain, warmth, swelling or fever)  Over the next 48 hours your wound should start to improve with less pain, less swelling and less redness. If you should develop increasing pain, swelling, redness, fever, pus from the wound you should be seen immediately to make sure this is not becoming infected.   WOUND CARE: Please keep a layer of antibiotic ointment (bacitracin preferred) on this wound at least twice a day for the next seven days and keep a sterile dressing over top of it. You may gently clean the wound with warm soap and water  between dressing changes.  We strongly recommend that you have a medical provider reevaluate your wound within 2 to 3 days in person to make sure that it is healing appropriately.  Thank you for choosing an e-visit.  Your e-visit answers were reviewed by a board certified advanced clinical practitioner to complete your  personal care plan. Depending upon the condition, your plan could have included both over the counter or prescription medications.  Please review your pharmacy choice. Make sure the pharmacy is open so you can pick up prescription now. If there is a problem, you may contact your provider through [employer] and have the prescription routed to another pharmacy.  Your safety is important to us . If you have drug allergies check your prescription carefully.   For the next 24 hours you can use MyChart to ask questions about today's visit, request a non-urgent call back, or ask for a work or school excuse. You will get an email in the next two days asking about your experience. I hope that your e-visit has been valuable and will speed your recovery.   have provided 5 minutes of non face to face time during this encounter for chart review and documentation.

## 2023-11-09 ENCOUNTER — Other Ambulatory Visit: Payer: Self-pay | Admitting: Internal Medicine

## 2023-11-10 ENCOUNTER — Other Ambulatory Visit: Payer: Self-pay | Admitting: Internal Medicine

## 2023-11-11 MED ORDER — ALPRAZOLAM 0.5 MG PO TABS
ORAL_TABLET | ORAL | 0 refills | Status: DC
Start: 2023-11-11 — End: 2023-12-12

## 2023-11-14 NOTE — Telephone Encounter (Signed)
 Requested medication (s) are due for refill today:   Provider to review  Requested medication (s) are on the active medication list:   Yes  Future visit scheduled:   Yes 9/4   LOV 10/04/2023   Last ordered: 11/11/2023 #20, 0 refills  Non delegated refill.   [facility]11/[facility]23 [id number]   [id number]      Requested Prescriptions  Pending Prescriptions Disp Refills   ALPRAZolam  (XANAX ) 0.5 MG tablet [Pharmacy Med Name: ALPRAZOLAM  0.5 MG TABLET] 20 tablet 0    Sig: TAKE 1 TABLET BY MOUTH EVERY DAY AS NEEDED FOR ANXIETY     Not Delegated - Psychiatry: Anxiolytics/Hypnotics 2 Failed - 11/14/2023 10:56 AM      Failed - This refill cannot be delegated      Failed - Urine Drug Screen completed in last 360 days      Failed - Valid encounter within last 6 months    Recent Outpatient Visits           1 month ago Mixed hyperlipidemia   [place] [facility] [place], [place] W, NP   8 years ago Sciatica of left side   [facility], Sable Cram, MD   8 years ago Recurrent acute suppurative otitis media without spontaneous rupture of left tympanic membrane   [facility], Gaylord Kearns, MD              Passed - Patient is not pregnant

## 2023-11-21 DIAGNOSIS — E785 Hyperlipidemia, unspecified: Secondary | ICD-10-CM | POA: Diagnosis not present

## 2023-11-21 DIAGNOSIS — K219 Gastro-esophageal reflux disease without esophagitis: Secondary | ICD-10-CM | POA: Diagnosis not present

## 2023-11-21 DIAGNOSIS — F1020 Alcohol dependence, uncomplicated: Secondary | ICD-10-CM | POA: Diagnosis not present

## 2023-11-21 DIAGNOSIS — Z5986 Financial insecurity: Secondary | ICD-10-CM | POA: Diagnosis not present

## 2023-11-21 DIAGNOSIS — F419 Anxiety disorder, unspecified: Secondary | ICD-10-CM | POA: Diagnosis not present

## 2023-11-21 DIAGNOSIS — I10 Essential (primary) hypertension: Secondary | ICD-10-CM | POA: Diagnosis not present

## 2023-11-21 DIAGNOSIS — Z562 Threat of job loss: Secondary | ICD-10-CM | POA: Diagnosis not present

## 2023-11-21 DIAGNOSIS — F331 Major depressive disorder, recurrent, moderate: Secondary | ICD-10-CM | POA: Diagnosis not present

## 2023-11-21 DIAGNOSIS — Z79899 Other long term (current) drug therapy: Secondary | ICD-10-CM | POA: Diagnosis not present

## 2023-11-21 DIAGNOSIS — I498 Other specified cardiac arrhythmias: Secondary | ICD-10-CM | POA: Diagnosis not present

## 2023-11-21 DIAGNOSIS — Z91148 Patient's other noncompliance with medication regimen for other reason: Secondary | ICD-10-CM | POA: Diagnosis not present

## 2023-11-21 DIAGNOSIS — F10239 Alcohol dependence with withdrawal, unspecified: Secondary | ICD-10-CM | POA: Diagnosis not present

## 2023-11-22 DIAGNOSIS — F331 Major depressive disorder, recurrent, moderate: Secondary | ICD-10-CM | POA: Diagnosis not present

## 2023-11-22 DIAGNOSIS — F1020 Alcohol dependence, uncomplicated: Secondary | ICD-10-CM | POA: Diagnosis not present

## 2023-11-23 DIAGNOSIS — F331 Major depressive disorder, recurrent, moderate: Secondary | ICD-10-CM | POA: Diagnosis not present

## 2023-11-23 DIAGNOSIS — F1020 Alcohol dependence, uncomplicated: Secondary | ICD-10-CM | POA: Diagnosis not present

## 2023-11-24 DIAGNOSIS — F331 Major depressive disorder, recurrent, moderate: Secondary | ICD-10-CM | POA: Diagnosis not present

## 2023-11-24 DIAGNOSIS — F1020 Alcohol dependence, uncomplicated: Secondary | ICD-10-CM | POA: Diagnosis not present

## 2023-11-25 ENCOUNTER — Telehealth: Payer: Self-pay | Admitting: *Deleted

## 2023-11-25 NOTE — Transitions of Care (Post Inpatient/ED Visit) (Signed)
   11/25/2023  Name: Linda [name] MRN: [id number] DOB: 12/01/1975  Today's TOC FU Call Status: Today's TOC FU Call Status:: Unsuccessful Call (1st Attempt) Unsuccessful Call (1st Attempt) Date: 11/25/23  Attempted to reach the patient regarding the most recent Inpatient/ED visit.  Follow Up Plan: Additional outreach attempts will be made to reach the patient to complete the Transitions of Care (Post Inpatient/ED visit) call.   Arna Better RN, BSN [place]  [facility] RN Care Manager [phone]

## 2023-11-28 ENCOUNTER — Telehealth: Payer: Self-pay

## 2023-11-28 NOTE — Transitions of Care (Post Inpatient/ED Visit) (Signed)
   11/28/2023  Name: NASIRA JANUSZ MRN: [id number] DOB: 1976-02-18  Today's TOC FU Call Status: Today's TOC FU Call Status:: Unsuccessful Call (2nd Attempt) Unsuccessful Call (2nd Attempt) Date: 11/28/23 Unable to leave a confidential voice mail.   Attempted to reach the patient regarding the most recent Inpatient/ED visit.  Follow Up Plan: Additional outreach attempts will be made to reach the patient to complete the Transitions of Care (Post Inpatient/ED visit) call.    Katheryn Pandy MSN, RN RN Case Sales executive Health  [facility] Office Hours M-F [phone] Direct Dial: [phone] Main Phone [phone]  Fax: [phone] [email or web address]

## 2023-11-28 NOTE — Transitions of Care (Post Inpatient/ED Visit) (Signed)
   11/28/2023  Name: Linda [name] MRN: [id number] DOB: [birth year]  Today's TOC FU Call Status: Today's TOC FU Call Status:: Unsuccessful Call (3rd Attempt) Unsuccessful Call (3rd Attempt) Date: 11/28/23  Attempted to reach the patient regarding the most recent Inpatient/ED visit.  Follow Up Plan: No further outreach attempts will be made at this time. We have been unable to contact the patient.   Katheryn Pandy MSN, RN RN Case Sales executive Health  [facility] Office Hours M-F [phone] Direct Dial: [phone] Main Phone [phone]  Fax: [phone] [email or web address]

## 2023-12-09 ENCOUNTER — Encounter: Payer: Self-pay | Admitting: Internal Medicine

## 2023-12-09 ENCOUNTER — Encounter (HOSPITAL_COMMUNITY): Payer: Self-pay | Admitting: *Deleted

## 2023-12-10 ENCOUNTER — Other Ambulatory Visit: Payer: Self-pay | Admitting: Internal Medicine

## 2023-12-12 NOTE — Telephone Encounter (Signed)
 Requested medications are due for refill today.  yes  Requested medications are on the active medications list.  yes  Last refill. 11/11/2023 #20 0 rf  Future visit scheduled.   yes  Notes to clinic.  Refill not deleated    Requested Prescriptions  Pending Prescriptions Disp Refills   ALPRAZolam  (XANAX ) 0.5 MG tablet [Pharmacy Med Name: ALPRAZOLAM  0.5 MG TABLET] 20 tablet 0    Sig: TAKE 1 TABLET BY MOUTH EVERY DAY AS NEEDED FOR ANXIETY     Not Delegated - Psychiatry: Anxiolytics/Hypnotics 2 Failed - 12/12/2023  9:55 AM      Failed - This refill cannot be delegated      Failed - Urine Drug Screen completed in last 360 days      Failed - Valid encounter within last 6 months    Recent Outpatient Visits           2 months ago Mixed hyperlipidemia   [place] [facility] [place], [place] W, NP   8 years ago Sciatica of left side   [facility], Sable Cram, MD   8 years ago Recurrent acute suppurative otitis media without spontaneous rupture of left tympanic membrane   [facility], Gaylord Kearns, MD              Passed - Patient is not pregnant

## 2023-12-24 ENCOUNTER — Other Ambulatory Visit: Payer: Self-pay | Admitting: Internal Medicine

## 2023-12-27 NOTE — Telephone Encounter (Signed)
 Requested by interface surescripts. Medication discontinued 03/02/23.  Requested Prescriptions  Refused Prescriptions Disp Refills   esomeprazole  (NEXIUM ) 40 MG capsule [Pharmacy Med Name: ESOMEPRAZOLE  MAG DR 40 MG CAP] 90 capsule 1    Sig: TAKE 1 CAPSULE (40 MG TOTAL) BY MOUTH DAILY.     Gastroenterology: Proton Pump Inhibitors 2 Failed - 12/27/2023  9:30 AM      Failed - Valid encounter within last 12 months    Recent Outpatient Visits           2 months ago Mixed hyperlipidemia   [place] [facility] [place], [place] W, NP   8 years ago Sciatica of left side   [facility], Sable Cram, MD   8 years ago Recurrent acute suppurative otitis media without spontaneous rupture of left tympanic membrane   [facility], Gaylord Kearns, MD              Passed - ALT in normal range and within 360 days    ALT  Date Value Ref Range Status  10/04/2023 16 6 - 29 U/L Final         Passed - AST in normal range and within 360 days    AST  Date Value Ref Range Status  10/04/2023 20 10 - 35 U/L Final

## 2024-01-04 ENCOUNTER — Encounter: Payer: Self-pay | Admitting: Internal Medicine

## 2024-01-04 MED ORDER — AMPHETAMINE-DEXTROAMPHET ER 20 MG PO CP24: 20.0000 mg | ORAL_CAPSULE | ORAL | 0 refills | Status: DC

## 2024-01-06 ENCOUNTER — Encounter: Payer: Self-pay | Admitting: Internal Medicine

## 2024-01-06 MED ORDER — ALPRAZOLAM 0.5 MG PO TABS: ORAL_TABLET | ORAL | 0 refills | Status: DC

## 2024-01-06 NOTE — Telephone Encounter (Signed)
Please refer to previous message

## 2024-01-16 ENCOUNTER — Other Ambulatory Visit: Payer: Self-pay | Admitting: Internal Medicine

## 2024-01-16 ENCOUNTER — Encounter: Payer: Self-pay | Admitting: Internal Medicine

## 2024-01-16 DIAGNOSIS — G43109 Migraine with aura, not intractable, without status migrainosus: Secondary | ICD-10-CM

## 2024-01-16 NOTE — Telephone Encounter (Signed)
 Spoke with patient she is anxious about going out of the country. Made an appt to see Angeline prior to leaving.

## 2024-01-17 NOTE — Telephone Encounter (Signed)
 Requested Prescriptions  Pending Prescriptions Disp Refills   SUMAtriptan  (IMITREX ) 100 MG tablet [Pharmacy Med Name: SUMATRIPTAN  SUCC 100 MG TABLET] 10 tablet 2    Sig: TAKE 1 TABLET EVERY 2 HOURS AS NEEDED FOR MIGRAINE MAY REPEAT IN 2 HRS IF HEADACHE PERSISTS     Neurology:  Migraine Therapy - Triptan Failed - 01/17/2024  3:43 PM      Failed - Last BP in normal range    BP Readings from Last 1 Encounters:  10/04/23 (!) 142/84         Passed - Valid encounter within last 12 months    Recent Outpatient Visits           3 months ago Mixed hyperlipidemia   [place] [facility] [place], [place] W, NP   8 years ago Sciatica of left side   [facility], [place], MD   8 years ago Recurrent acute suppurative otitis media without spontaneous rupture of left tympanic membrane   [facility], Juliane RAMAN, MD

## 2024-01-17 NOTE — Telephone Encounter (Signed)
 Requested Prescriptions  Refused Prescriptions Disp Refills   SUMAtriptan  (IMITREX ) 100 MG tablet [Pharmacy Med Name: SUMATRIPTAN  SUCC 100 MG TABLET] 9 tablet 3    Sig: MAY TAKE 1TAB BY MOUTH AS NEEDED FOR MIGRAINE*MAY REPEAT 1TAB IN 2HRS IF HEADACHE PERSISTS/RECURS     Neurology:  Migraine Therapy - Triptan Failed - 01/17/2024  4:33 PM      Failed - Last BP in normal range    BP Readings from Last 1 Encounters:  10/04/23 (!) 142/84         Passed - Valid encounter within last 12 months    Recent Outpatient Visits           3 months ago Mixed hyperlipidemia   [place] [facility] [place], [place] W, NP   8 years ago Sciatica of left side   [facility], [place], MD   8 years ago Recurrent acute suppurative otitis media without spontaneous rupture of left tympanic membrane   [facility], Juliane RAMAN, MD

## 2024-01-24 ENCOUNTER — Ambulatory Visit: Payer: Self-pay

## 2024-01-24 ENCOUNTER — Telehealth: Admitting: Physician Assistant

## 2024-01-24 ENCOUNTER — Ambulatory Visit
Admission: RE | Admit: 2024-01-24 | Discharge: 2024-01-24 | Disposition: A | Discharge status: Home / Self Care | Source: Ambulatory Visit | Attending: Family Medicine | Admitting: Family Medicine

## 2024-01-24 VITALS — BP 123/85 | HR 90 | Temp 98.0°F | Resp 20

## 2024-01-24 DIAGNOSIS — L03019 Cellulitis of unspecified finger: Secondary | ICD-10-CM | POA: Diagnosis not present

## 2024-01-24 DIAGNOSIS — L03011 Cellulitis of right finger: Secondary | ICD-10-CM

## 2024-01-24 MED ORDER — ONDANSETRON HCL 4 MG/2ML IJ SOLN
4.0000 mg | Freq: Once | INTRAMUSCULAR | Status: AC
  Administered 2024-01-24: 4 mg via INTRAMUSCULAR

## 2024-01-24 MED ORDER — SULFAMETHOXAZOLE-TRIMETHOPRIM 800-160 MG PO TABS: 1.0000 | ORAL_TABLET | Freq: Two times a day (BID) | ORAL | 0 refills | Status: DC

## 2024-01-24 MED ORDER — DOXYCYCLINE HYCLATE 100 MG PO CAPS: 100.0000 mg | ORAL_CAPSULE | Freq: Two times a day (BID) | ORAL | 0 refills | Status: AC

## 2024-01-24 MED ORDER — ONDANSETRON 4 MG PO TBDP: 4.0000 mg | ORAL_TABLET | Freq: Three times a day (TID) | ORAL | 0 refills | Status: DC | PRN

## 2024-01-24 MED ORDER — CEPHALEXIN 500 MG PO CAPS: 500.0000 mg | ORAL_CAPSULE | Freq: Three times a day (TID) | ORAL | 0 refills | Status: AC

## 2024-01-24 NOTE — Addendum Note (Signed)
 Addended by: GLADIS ELSIE BROCKS on: 01/24/2024 08:18 AM   Modules accepted: Orders

## 2024-01-24 NOTE — ED Provider Notes (Signed)
 [facility] - URGENT CARE C[facility]  Note:  This document was prepared using Conservation officer, historic buildings and may include unintentional dictation errors.  MRN: [id number] DOB: 10/11/1975  Subjective:   Linda [name] is a 48 y.o. female presenting for 5-day history of acute onset persistent and worsening right index finger swelling, pain, redness.  Was seen at a different urgent care and started on Keflex  yesterday.  No I&D was done.  She has gotten worse.  She is feeling nauseous from the antibiotic.  Is requesting medication for that.  No current facility-administered medications for this encounter.  Current Outpatient Medications:    albuterol  (VENTOLIN  HFA) 108 (90 Base) MCG/ACT inhaler, Inhale 2 puffs into the lungs every 6 (six) hours as needed for wheezing or shortness of breath., Disp: 18 g, Rfl: 0   ALPRAZolam  (XANAX ) 0.5 MG tablet, TAKE 1 TABLET BY MOUTH EVERY DAY AS NEEDED FOR ANXIETY, Disp: 20 tablet, Rfl: 0   amLODipine -olmesartan  (AZOR ) 5-20 MG tablet, TAKE 1 TABLET BY MOUTH EVERY DAY, Disp: 90 tablet, Rfl: 1   amphetamine -dextroamphetamine (ADDERALL XR) 20 MG 24 hr capsule, Take 1 capsule (20 mg total) by mouth every morning., Disp: 30 capsule, Rfl: 0   atorvastatin  (LIPITOR) 10 MG tablet, Take 1 tablet (10 mg total) by mouth daily., Disp: 90 tablet, Rfl: 1   cephALEXin  (KEFLEX ) 500 MG capsule, Take 1 capsule (500 mg total) by mouth 3 (three) times daily for 7 days., Disp: 21 capsule, Rfl: 0   lansoprazole  (PREVACID ) 30 MG capsule, Take 1 capsule (30 mg total) by mouth daily at 12 noon., Disp: 90 capsule, Rfl: 1   SUMAtriptan  (IMITREX ) 100 MG tablet, TAKE 1 TABLET EVERY 2 HOURS AS NEEDED FOR MIGRAINE MAY REPEAT IN 2 HRS IF HEADACHE PERSISTS, Disp: 10 tablet, Rfl: 2   Allergies  Allergen Reactions   Bacitra-Neomycin -Polymyxin-Hc Other (See Comments)    Pain and swelling of ear canal    Past Medical History:  Diagnosis Date   Alcohol abuse    Anxiety    Asthma     GERD (gastroesophageal reflux disease)    Heart murmur    while pregnant   Hypertension    Migraine      Past Surgical History:  Procedure Laterality Date   ABDOMINAL HYSTERECTOMY  12/20012   Total   ENDOMETRIAL ABLATION  2012   GASTRIC ROUX-EN-Y N/A 05/18/2016   Procedure: LAPAROSCOPIC ROUX-EN-Y GASTRIC WITH UPPER ENDO;  Surgeon: Herlene Righter Kinsinger, MD;  Location: [facility];  Service: General;  Laterality: N/A;   MYRINGOTOMY WITH TUBE PLACEMENT     X 3 SINCE ORRIGINALLY PLACE IN 2010   TONSILECTOMY, ADENOIDECTOMY, BILATERAL MYRINGOTOMY AND TUBES  2010   TONSILLECTOMY     TUBAL LIGATION  08/2010   UPPER GI ENDOSCOPY  05/18/2016   Procedure: UPPER GI ENDOSCOPY;  Surgeon: Herlene Righter Kinsinger, MD;  Location: [facility];  Service: General;;    Family History  Problem Relation Age of Onset   Breast cancer Mother        99s   Hypertension Mother    Uterine cancer Mother    Stroke Mother    Heart attack Mother    Hypertension Father    Diabetes Father    Multiple sclerosis Father    Breast cancer Maternal Aunt    Diabetes Maternal Aunt    Diabetes Maternal Uncle    Cancer Maternal Grandmother        PANCREATIC   Hypertension Maternal Grandmother  Diabetes Maternal Grandmother    Cancer Maternal Grandfather        LUNG   Hypertension Maternal Grandfather    Diabetes Maternal Grandfather    Cancer Paternal Grandmother        THROAT   Alzheimer's disease Paternal Grandmother     Social History   Tobacco Use   Smoking status: Never   Smokeless tobacco: Never  Vaping Use   Vaping status: Never Used  Substance Use Topics   Alcohol use: Yes    Comment: occ   Drug use: No    ROS   Objective:   Vitals: BP 123/85 (BP Location: Right Arm)   Pulse 90   Temp 98 F (36.7 C) (Oral)   Resp 20   LMP 07/12/2011   SpO2 97%   Physical Exam Constitutional:      General: She is not in acute distress.    Appearance: Normal appearance. She is well-developed. She is not  ill-appearing, toxic-appearing or diaphoretic.  HENT:     Head: Normocephalic and atraumatic.     Nose: Nose normal.     Mouth/Throat:     Mouth: Mucous membranes are moist.  Eyes:     General: No scleral icterus.       Right eye: No discharge.        Left eye: No discharge.     Extraocular Movements: Extraocular movements intact.  Cardiovascular:     Rate and Rhythm: Normal rate.  Pulmonary:     Effort: Pulmonary effort is normal.  Musculoskeletal:       Hands:  Skin:    General: Skin is warm and dry.  Neurological:     General: No focal deficit present.     Mental Status: She is alert and oriented to person, place, and time.  Psychiatric:        Mood and Affect: Mood normal.        Behavior: Behavior normal.    PROCEDURE NOTE: Paronychia I&D Verbal consent obtained. Local anesthesia with 1cc of 2% lidocaine  with epinephrine. Site cleansed with Betadine and alcohol swabs.  Paronychia expressed using an 11 blade, Adson forcep, discharge ~2cc mixture of purulence, serosanguineous fluid. Cleansed and dressed.   Assessment and Plan :   PDMP not reviewed this encounter.  1. Paronychia of right index finger    Successful I&D performed.  Wound care reviewed.  Stop Keflex , start doxycycline  for the paronychia. Counseled patient on potential for adverse effects with medications prescribed/recommended today, ER and return-to-clinic precautions discussed, patient verbalized understanding.  Patient experienced a near vasovagal episode without syncope. She was stabilized and discharged without incident.    Christopher Savannah, PA-C 01/24/24 1023

## 2024-01-24 NOTE — Discharge Instructions (Addendum)
 Please change your dressing 3-5 times daily.  Each time you change your dressing, make sure that you are pressing on the wound to get pus to come out.  Try your best to clean the wound with antibacterial soap and warm water . Pat your wound dry and let it air out if possible to make sure it is dry before reapplying another dressing.   Stop Keflex , start doxycycline  for the infection. Use Zofran  as needed for nausea and vomiting.

## 2024-01-24 NOTE — ED Triage Notes (Addendum)
 Pt c/o redness, swelling and pain to right index finger tip x 5 days-seen yesterday at [facility] and started on keflex -states area is worse-NAD-steady gait

## 2024-01-24 NOTE — Progress Notes (Signed)
 I have spent 5 minutes in review of e-visit questionnaire, review and updating patient chart, medical decision making and response to patient.   Piedad Climes, PA-C

## 2024-01-24 NOTE — Progress Notes (Signed)
 E Visit for Cellulitis  We are sorry that you are not feeling well. Here is how we plan to help!  Based on what you shared with me it looks like you have cellulitis.  Cellulitis looks like areas of skin redness, swelling, and warmth; it develops as a result of bacteria entering under the skin. Little red spots and/or bleeding can be seen in skin, and tiny surface sacs containing fluid can occur. Fever can be present. Cellulitis is almost always on one side of a body, and the lower limbs are the most common site of involvement.   I have prescribed:  Bactrim DS 1 tablet by mouth twice a day for 7 days  HOME CARE:  Take your medications as ordered and take all of them, even if the skin irritation appears to be healing.   GET HELP RIGHT AWAY IF:  Symptoms that don't begin to go away within 48 hours. Severe redness persists or worsens If the area turns color, spreads or swells. If it blisters and opens, develops yellow-brown crust or bleeds. You develop a fever or chills. If the pain increases or becomes unbearable.  Are unable to keep fluids and food down.  MAKE SURE YOU   Understand these instructions. Will watch your condition. Will get help right away if you are not doing well or get worse.  Thank you for choosing an e-visit.  Your e-visit answers were reviewed by a board certified advanced clinical practitioner to complete your personal care plan. Depending upon the condition, your plan could have included both over the counter or prescription medications.  Please review your pharmacy choice. Make sure the pharmacy is open so you can pick up prescription now. If there is a problem, you may contact your provider through [employer] and have the prescription routed to another pharmacy.  Your safety is important to [place]. If you have drug allergies check your prescription carefully.   For the next 24 hours you can use MyChart to ask questions about today's visit, request a  non-urgent call back, or ask for a work or school excuse. You will get an email in the next two days asking about your experience. I hope that your e-visit has been valuable and will speed your recovery.

## 2024-01-26 ENCOUNTER — Ambulatory Visit: Admitting: Internal Medicine

## 2024-01-26 ENCOUNTER — Other Ambulatory Visit (HOSPITAL_COMMUNITY): Payer: Self-pay

## 2024-01-26 ENCOUNTER — Telehealth: Payer: Self-pay

## 2024-01-26 NOTE — Telephone Encounter (Signed)
 Pharmacy Patient Advocate Encounter  Insurance verification completed.   The patient is insured through [facility]   Ran test claim for Amphetamine -Dextroamphet ER 20MG  er capsules The current 30 day TO SOON.  No PA needed at this time.  NEXT AVAILABLE FILL DATE [id number] LAST FILL DT [id number] FILLED AT PHARM[facility]Y [id number] ,PHONE #[phone] THE test claim was processed through [facility]- copay amounts may vary at other pharmacies due to pharmacy/plan contracts, or as the patient moves through the different stages of their insurance plan.

## 2024-01-28 ENCOUNTER — Other Ambulatory Visit: Payer: Self-pay | Admitting: Internal Medicine

## 2024-01-31 NOTE — Telephone Encounter (Signed)
 Requested Prescriptions  Pending Prescriptions Disp Refills   albuterol  (VENTOLIN  HFA) 108 (90 Base) MCG/ACT inhaler [Pharmacy Med Name: ALBUTEROL  HFA (VENTOLIN ) INH] 18 each 0    Sig: TAKE 2 PUFFS BY MOUTH EVERY 6 HOURS AS NEEDED FOR WHEEZE OR SHORTNESS OF BREATH     Pulmonology:  Beta Agonists 2 Passed - 01/31/2024 11:25 AM      Passed - Last BP in normal range    BP Readings from Last 1 Encounters:  01/24/24 123/85         Passed - Last Heart Rate in normal range    Pulse Readings from Last 1 Encounters:  01/24/24 90         Passed - Valid encounter within last 12 months    Recent Outpatient Visits           3 months ago Mixed hyperlipidemia   [place] [facility] [place], [place] W, NP   8 years ago Sciatica of left side   [facility], [place], MD   8 years ago Recurrent acute suppurative otitis media without spontaneous rupture of left tympanic membrane   [facility], Juliane RAMAN, MD

## 2024-02-02 ENCOUNTER — Encounter: Payer: Self-pay | Admitting: Internal Medicine

## 2024-02-02 MED ORDER — ALPRAZOLAM 0.5 MG PO TABS: ORAL_TABLET | ORAL | 0 refills | Status: DC

## 2024-02-17 ENCOUNTER — Encounter: Payer: Self-pay | Admitting: Internal Medicine

## 2024-02-17 MED ORDER — ALPRAZOLAM 0.5 MG PO TABS: ORAL_TABLET | ORAL | 0 refills | Status: DC

## 2024-02-17 MED ORDER — AMPHETAMINE-DEXTROAMPHET ER 20 MG PO CP24: 20.0000 mg | ORAL_CAPSULE | ORAL | 0 refills | Status: DC

## 2024-03-15 ENCOUNTER — Encounter: Admitting: Internal Medicine

## 2024-03-23 ENCOUNTER — Encounter: Payer: Self-pay | Admitting: Internal Medicine

## 2024-03-23 ENCOUNTER — Other Ambulatory Visit: Payer: Self-pay | Admitting: Internal Medicine

## 2024-03-27 MED ORDER — AMPHETAMINE-DEXTROAMPHET ER 20 MG PO CP24: 20.0000 mg | ORAL_CAPSULE | ORAL | 0 refills | Status: AC

## 2024-03-27 NOTE — Addendum Note (Signed)
 Addended by: ANTONETTE ANGELINE ORN on: 03/27/2024 11:58 AM   Modules accepted: Orders

## 2024-03-28 ENCOUNTER — Other Ambulatory Visit (HOSPITAL_COMMUNITY): Payer: Self-pay

## 2024-03-28 ENCOUNTER — Telehealth: Payer: Self-pay

## 2024-03-28 NOTE — Telephone Encounter (Signed)
 Pharmacy Patient Advocate Encounter   Received notification from Physician's Office that prior authorization for Amphetamine -Dextroamphet ER 20MG  er capsules is required/requested.   Insurance verification completed.   The patient is insured through [facility] .   Per test claim: PA required and submitted KEY/EOC/Request #: [id number] from 03/28/2024 to 03/28/2025

## 2024-03-28 NOTE — Telephone Encounter (Signed)
 Submitted today and it has been approved! Pt should be able to fill at pharmacy, thanks

## 2024-04-03 ENCOUNTER — Encounter: Payer: Self-pay | Admitting: Internal Medicine

## 2024-04-03 ENCOUNTER — Encounter: Admitting: Internal Medicine

## 2024-04-03 NOTE — Progress Notes (Deleted)
 Subjective:    Patient ID: Linda [name], female    DOB: 1976-07-04, 48 y.o.   MRN: [id number]  HPI  Patient presents the clinic today for her annual exam.  Flu: 04/2021 Tetanus: 08/2015 COVID: x 3 Pap smear: Hysterectomy Mammogram: 03/2023 Colon screening: Never Vision screening: as needed Dentist: annually  Diet: She does eat meat. She consumes fruits and veggies. She tries to avoid fried foods. She drinks mostly water . Exercise: Walking  Review of Systems     Past Medical History:  Diagnosis Date   Alcohol abuse    Anxiety    Asthma    GERD (gastroesophageal reflux disease)    Heart murmur    while pregnant   Hypertension    Migraine     Current Outpatient Medications  Medication Sig Dispense Refill   albuterol  (VENTOLIN  HFA) 108 (90 Base) MCG/ACT inhaler TAKE 2 PUFFS BY MOUTH EVERY 6 HOURS AS NEEDED FOR WHEEZE OR SHORTNESS OF BREATH 18 each 0   ALPRAZolam  (XANAX ) 0.5 MG tablet TAKE 1 TABLET BY MOUTH EVERY DAY AS NEEDED FOR ANXIETY 20 tablet 0   amLODipine -olmesartan  (AZOR ) 5-20 MG tablet TAKE 1 TABLET BY MOUTH EVERY DAY 90 tablet 1   amphetamine -dextroamphetamine (ADDERALL XR) 20 MG 24 hr capsule Take 1 capsule (20 mg total) by mouth every morning. 30 capsule 0   atorvastatin  (LIPITOR) 10 MG tablet Take 1 tablet (10 mg total) by mouth daily. 90 tablet 1   doxycycline  (VIBRAMYCIN ) 100 MG capsule Take 1 capsule (100 mg total) by mouth 2 (two) times daily. 20 capsule 0   lansoprazole  (PREVACID ) 30 MG capsule Take 1 capsule (30 mg total) by mouth daily at 12 noon. 90 capsule 1   ondansetron  (ZOFRAN -ODT) 4 MG disintegrating tablet Take 1 tablet (4 mg total) by mouth every 8 (eight) hours as needed for nausea or vomiting. 20 tablet 0   SUMAtriptan  (IMITREX ) 100 MG tablet TAKE 1 TABLET EVERY 2 HOURS AS NEEDED FOR MIGRAINE MAY REPEAT IN 2 HRS IF HEADACHE PERSISTS 10 tablet 2   No current facility-administered medications for this visit.    Allergies  Allergen  Reactions   Bacitra-Neomycin -Polymyxin-Hc Other (See Comments)    Pain and swelling of ear canal    Family History  Problem Relation Age of Onset   Breast cancer Mother        78s   Hypertension Mother    Uterine cancer Mother    Stroke Mother    Heart attack Mother    Hypertension Father    Diabetes Father    Multiple sclerosis Father    Breast cancer Maternal Aunt    Diabetes Maternal Aunt    Diabetes Maternal Uncle    Cancer Maternal Grandmother        PANCREATIC   Hypertension Maternal Grandmother    Diabetes Maternal Grandmother    Cancer Maternal Grandfather        LUNG   Hypertension Maternal Grandfather    Diabetes Maternal Grandfather    Cancer Paternal Grandmother        THROAT   Alzheimer's disease Paternal Grandmother     Social History   Socioeconomic History   Marital status: Divorced    Spouse name: Not on file   Number of children: Not on file   Years of education: Not on file   Highest education level: Master's degree (e.g., MA, MS, MEng, MEd, MSW, MBA)  Occupational History   Not on file  Tobacco Use   Smoking  status: Never   Smokeless tobacco: Never  Vaping Use   Vaping status: Never Used  Substance and Sexual Activity   Alcohol use: Yes    Comment: occ   Drug use: No   Sexual activity: Not on file  Other Topics Concern   Not on file  Social History Narrative   Not on file   Social Drivers of Health   Financial Resource Strain: Low Risk  (04/02/2024)   Overall Financial Resource Strain (CARDIA)    Difficulty of Paying Living Expenses: Not very hard  Food Insecurity: No Food Insecurity (04/02/2024)   Hunger Vital Sign    Worried About Running Out of Food in the Last Year: Never true    Ran Out of Food in the Last Year: Never true  Transportation Needs: No Transportation Needs (04/02/2024)   PRAPARE - Administrator, Civil Service (Medical): No    Lack of Transportation (Non-Medical): No  Physical Activity: Insufficiently  Active (04/02/2024)   Exercise Vital Sign    Days of Exercise per Week: 4 days    Minutes of Exercise per Session: 20 min  Stress: Stress Concern Present (04/02/2024)   [employer] of Occupational Health - Occupational Stress Questionnaire    Feeling of Stress: To some extent  Social Connections: Moderately Integrated (04/02/2024)   Social Connection and Isolation Panel    Frequency of Communication with Friends and Family: More than three times a week    Frequency of Social Gatherings with Friends and Family: Twice a week    Attends Religious Services: 1 to 4 times per year    Active Member of [employer] or Organizations: Yes    Attends Engineer, structural: More than 4 times per year    Marital Status: Divorced  Intimate Partner Violence: Not At Risk (03/16/2022)   Humiliation, Afraid, Rape, and Kick questionnaire    Fear of Current or Ex-Partner: No    Emotionally Abused: No    Physically Abused: No    Sexually Abused: No     Constitutional: Patient reports intermittent headaches.  Denies fever, malaise, fatigue, or abrupt weight changes.  HEENT: Pt reports nasal congestion, sneezing. Denies eye pain, eye redness, ear pain, ringing in the ears, wax buildup, runny nose, bloody nose, or sore throat. Respiratory: Denies difficulty breathing, shortness of breath, cough or sputum production.   Cardiovascular: Denies chest pain, chest tightness, palpitations or swelling in the hands or feet.  Gastrointestinal: Denies abdominal pain, bloating, constipation, diarrhea or blood in the stool.  GU: Denies urgency, frequency, pain with urination, burning sensation, blood in urine, odor or discharge. Musculoskeletal:  Denies decrease in range of motion, difficulty with gait, muscle pain, joint pain or swelling.  Skin: Denies redness, rashes, lesions or ulcercations.  Neurological: Patient reports intention.  Denies dizziness, difficulty with memory, difficulty with speech or problems with  balance and coordination.  Psych: Patient has a history of anxiety and depression.  Denies SI/HI.  No other specific complaints in a complete review of systems (except as listed in HPI above).  Objective:   Physical Exam  LMP 07/12/2011   Wt Readings from Last 3 Encounters:  10/04/23 187 lb 9.6 oz (85.1 kg)  09/30/23 195 lb (88.5 kg)  03/02/23 188 lb (85.3 kg)    General: Appears her stated age, overweight, in NAD. Skin: Warm, dry and intact.  Healed abrasion over left patella. HEENT: Head: normal shape and size; Eyes: sclera white, no icterus, conjunctiva pink, PERRLA and EOMs  intact;  Neck:  Neck supple, trachea midline. No masses, lumps or thyromegaly present.  Cardiovascular: Normal rate and rhythm. S1,S2 noted.  No murmur, rubs or gallops noted. No JVD or BLE edema.  Pulmonary/Chest: Normal effort and positive vesicular breath sounds. No respiratory distress. No wheezes, rales or ronchi noted.  Abdomen: Normal bowel sounds.  Musculoskeletal: She has difficulty with full extension of the left knee.  Normal flexion of the left knee.  Pain with palpation of the inferior patellar tendon.  Swelling noted on the lateral side of the left knee.  Strength 5/5 BUE/BLE. No difficulty with gait.  Neurological: Alert and oriented. Cranial nerves II-XII grossly intact. Coordination normal.  Psychiatric: Mood and affect normal. Behavior is normal. Judgment and thought content normal.    BMET    Component Value Date/Time   NA 140 10/04/2023 0838   NA 137 05/31/2013 0846   K 4.2 10/04/2023 0838   K 3.8 05/31/2013 0846   CL 105 10/04/2023 0838   CL 106 05/31/2013 0846   CO2 26 10/04/2023 0838   CO2 26 05/31/2013 0846   GLUCOSE 85 10/04/2023 0838   GLUCOSE 93 05/31/2013 0846   BUN 12 10/04/2023 0838   BUN 10 05/31/2013 0846   CREATININE 0.76 10/04/2023 0838   CALCIUM  8.8 10/04/2023 0838   CALCIUM  8.2 (L) 05/31/2013 0846   GFRNONAA >60 09/30/2023 1314   GFRNONAA >60 05/31/2013 0846    GFRAA >60 02/08/2020 0600   GFRAA >60 05/31/2013 0846    Lipid Panel     Component Value Date/Time   CHOL 252 (H) 10/04/2023 0838   TRIG 194 (H) 10/04/2023 0838   HDL 116 10/04/2023 0838   CHOLHDL 2.2 10/04/2023 0838   VLDL 35.2 05/17/2019 1606   LDLCALC 105 (H) 10/04/2023 0838    CBC    Component Value Date/Time   WBC 7.3 10/04/2023 0838   RBC 4.27 10/04/2023 0838   HGB 13.5 10/04/2023 0838   HGB 13.3 05/31/2013 0846   HCT 40.7 10/04/2023 0838   HCT 39.2 05/31/2013 0846   PLT 252 10/04/2023 0838   PLT 219 05/31/2013 0846   MCV 95.3 10/04/2023 0838   MCV 88 05/31/2013 0846   MCH 31.6 10/04/2023 0838   MCHC 33.2 10/04/2023 0838   RDW 12.4 10/04/2023 0838   RDW 12.8 05/31/2013 0846   LYMPHSABS 1.2 01/23/2023 1617   LYMPHSABS 2.1 05/31/2013 0846   MONOABS 0.3 01/23/2023 1617   MONOABS 0.4 05/31/2013 0846   EOSABS 0.1 01/23/2023 1617   EOSABS 0.1 05/31/2013 0846   BASOSABS 0.1 01/23/2023 1617   BASOSABS 0.1 05/31/2013 0846    Hgb A1C Lab Results  Component Value Date   HGBA1C 5.6 10/04/2023            Assessment & Plan:   Preventative health maintenance:  Encouraged her to get a flu shot in the fall Tetanus UTD Encouraged her to get her COVID booster She no longer needs to screen for cervical cancer Mammogram ordered-she will call to schedule Referral to GI for screening colonoscopy Encouraged her to consume a balanced diet and exercise regimen Advised her to see an eye doctor and dentist annually We will check CBC, c-Met, lipid, A1c today    RTC in 6 months, follow-up chronic conditions Angeline Laura, NP

## 2024-04-04 ENCOUNTER — Other Ambulatory Visit (HOSPITAL_COMMUNITY): Payer: Self-pay

## 2024-04-06 ENCOUNTER — Telehealth: Admitting: Physician Assistant

## 2024-04-06 DIAGNOSIS — S39012A Strain of muscle, fascia and tendon of lower back, initial encounter: Secondary | ICD-10-CM | POA: Diagnosis not present

## 2024-04-06 DIAGNOSIS — T753XXA Motion sickness, initial encounter: Secondary | ICD-10-CM

## 2024-04-06 MED ORDER — SCOPOLAMINE 1 MG/3DAYS TD PT72: 1.0000 | MEDICATED_PATCH | TRANSDERMAL | 0 refills | Status: DC

## 2024-04-06 NOTE — Progress Notes (Signed)
 E-Visit for Motion Sickness  We are sorry that you are not feeling well. Here is how we plan to help!  Based on what you have shared with me it looks like you have symptoms of motion sickness.  I have prescribed a medication that will help prevent or alleviate your symptoms:  Scopolamine  Transdermal 1 mg patch behind ear at least 4 hours prior to travel (preferably 12 hours) and leave in place for a minimum of 24 hours, but can be left on for up to 72 hours. If a second patch is needed you can repeat with a new patch behind the opposite ear originally used.   Prevention:  You might feel better if you keep your eyes focused on outside while you are in motion. For example, if you are in a car, sit in the front and look in the direction you are moving; if you are on a boat, stay on the deck and look to the horizon. This helps make what you see match the movement you are feeling, and so you are less likely to feel sick.  You should also avoid reading, watching a movie, texting or reading messages, or looking at things close to you inside the vehicle you are riding in.  Use the seat head rest. Lean your head against the back of the seat or head rest when traveling in vehicles with seats to minimize head movements.  On a ship: When making your reservations, choose a cabin in the middle of the ship and near the waterline. When on board, go up on deck and focus on the horizon.  In an airplane: Request a window seat and look out the window. A seat over the front edge of the wing is the most preferable spot (the degree of motion is the lowest here). Direct the air vent to blow cool air on your face.  On a train: Always face forward and sit near a window.  In a vehicle: Sit in the front seat; if you are the passenger, look at the scenery in the distance. For some people, driving the vehicle (rather than being a passenger) is an instant remedy.  Avoid others who have become nauseous with motion  sickness. Seeing and smelling others who have motion sickness may cause you to become sick.  GET HELP RIGHT AWAY IF:  Your symptoms do not improve or worsen within 2 days after treatment.  You cannot keep down fluids after trying the medication.  Other associated symptoms such as severe headache, visual field changes, fever, or intractable nausea and vomiting.  MAKE SURE YOU:  Understand these instructions. Will watch your condition. Will get help right away if you are not doing well or get worse.  Thank you for choosing an e-visit.  Your e-visit answers were reviewed by a board certified advanced clinical practitioner to complete your personal care plan. Depending upon the condition, your plan could have included both over the counter or prescription medications.  Please review your pharmacy choice. Be sure that the pharmacy you have chosen is open so that you can pick up your prescription now.  If there is a problem you may message your provider in MyChart to have the prescription routed to another pharmacy.  Your safety is important to us . If you have drug allergies check your prescription carefully.   For the next 24 hours, you can use MyChart to ask questions about today's visit, request a non-urgent call back, or ask for a work or school excuse  from your e-visit provider.  You will get an e-mail in the next two days asking about your experience. I hope that your e-visit has been valuable and will speed your recovery.   References or for more information: [email or web address]://[email or web address]://[email or web address]/ https://www.[email or web address]    I have spent 5 minutes in review of e-visit questionnaire, review and updating patient chart, medical decision making and response to patient.   Delon CHRISTELLA Dickinson, PA-C

## 2024-04-07 ENCOUNTER — Telehealth: Admitting: Family Medicine

## 2024-04-07 DIAGNOSIS — M545 Low back pain, unspecified: Secondary | ICD-10-CM

## 2024-04-07 NOTE — Progress Notes (Signed)
 Patient seen via telehealth visit yesterday 04/06/24 and prescribed NSAIDS, Voltaren  gel and Zanaflex for a Lumbar strain.  Will advise Pt if this is not helpful, she may opt to be seen in person at an urgent care.    Because of your symptoms, I feel your condition warrants further evaluation and I recommend that you be seen in a face-to-face visit.   NOTE: There will be NO CHARGE for this E-Visit   If you are having a true medical emergency, please call 911.     For an urgent face to face visit, [place] has multiple urgent care centers for your convenience.  Click the link below for the full list of locations and hours, walk-in wait times, appointment scheduling options and driving directions:  Urgent Care - [place], [place], [place], [place], [place], [place]  [place]     Your MyChart E-visit questionnaire answers were reviewed by a board certified advanced clinical practitioner to complete your personal care plan based on your specific symptoms.    Thank you for using e-Visits.     I have spent 5 minutes in review of e-visit questionnaire, review and updating patient chart, medical decision making and response to patient.   Roosvelt Mater, PA-C

## 2024-04-21 ENCOUNTER — Telehealth: Admitting: Physician Assistant

## 2024-04-21 DIAGNOSIS — T753XXD Motion sickness, subsequent encounter: Secondary | ICD-10-CM

## 2024-04-21 MED ORDER — SCOPOLAMINE 1 MG/3DAYS TD PT72: 1.0000 | MEDICATED_PATCH | TRANSDERMAL | 0 refills | Status: DC

## 2024-04-21 NOTE — Progress Notes (Signed)
 E-Visit for Motion Sickness  We are sorry that you are not feeling well. Here is how we plan to help!  Based on what you have shared with me it looks like you have symptoms of motion sickness.  I have prescribed a medication that will help prevent or alleviate your symptoms:  Scopolamine  Transdermal 1 mg patch behind ear at least 4 hours prior to travel (preferably 12 hours)   Prevention:  You might feel better if you keep your eyes focused on outside while you are in motion. For example, if you are in a car, sit in the front and look in the direction you are moving; if you are on a boat, stay on the deck and look to the horizon. This helps make what you see match the movement you are feeling, and so you are less likely to feel sick.  You should also avoid reading, watching a movie, texting or reading messages, or looking at things close to you inside the vehicle you are riding in.  Use the seat head rest. Lean your head against the back of the seat or head rest when traveling in vehicles with seats to minimize head movements.  On a ship: When making your reservations, choose a cabin in the middle of the ship and near the waterline. When on board, go up on deck and focus on the horizon.  In an airplane: Request a window seat and look out the window. A seat over the front edge of the wing is the most preferable spot (the degree of motion is the lowest here). Direct the air vent to blow cool air on your face.  On a train: Always face forward and sit near a window.  In a vehicle: Sit in the front seat; if you are the passenger, look at the scenery in the distance. For some people, driving the vehicle (rather than being a passenger) is an instant remedy.  Avoid others who have become nauseous with motion sickness. Seeing and smelling others who have motion sickness may cause you to become sick.  GET HELP RIGHT AWAY IF:  Your symptoms do not improve or worsen within 2 days after  treatment.  You cannot keep down fluids after trying the medication.  Other associated symptoms such as severe headache, visual field changes, fever, or intractable nausea and vomiting.  MAKE SURE YOU:  Understand these instructions. Will watch your condition. Will get help right away if you are not doing well or get worse.  Thank you for choosing an e-visit.  Your e-visit answers were reviewed by a board certified advanced clinical practitioner to complete your personal care plan. Depending upon the condition, your plan could have included both over the counter or prescription medications.  Please review your pharmacy choice. Be sure that the pharmacy you have chosen is open so that you can pick up your prescription now.  If there is a problem you may message your provider in MyChart to have the prescription routed to another pharmacy.  Your safety is important to us . If you have drug allergies check your prescription carefully.   For the next 24 hours, you can use MyChart to ask questions about today's visit, request a non-urgent call back, or ask for a work or school excuse from your e-visit provider.  You will get an e-mail in the next two days asking about your experience. I hope that your e-visit has been valuable and will speed your recovery.   References or for more information:  [email or web address]/ https://my.https://rowe.info/ https://www.uptodate.com   I have spent 5 minutes in review of e-visit questionnaire, review and updating patient chart, medical decision making and response to patient.   Harlene PEDLAR Ward, PA-C

## 2024-05-01 ENCOUNTER — Encounter: Payer: Self-pay | Admitting: Internal Medicine

## 2024-05-02 ENCOUNTER — Encounter: Admitting: Internal Medicine

## 2024-05-09 ENCOUNTER — Other Ambulatory Visit: Payer: Self-pay | Admitting: Internal Medicine

## 2024-05-10 NOTE — Telephone Encounter (Signed)
 Requested medications are due for refill today.  yes  Requested medications are on the active medications list.  yes  Last refill. 90/1 11/04/2023  Future visit scheduled.   no  Notes to clinic.  Pt has cancelled several appts. Labs are expired.    Requested Prescriptions  Pending Prescriptions Disp Refills   amLODipine -olmesartan  (AZOR ) 5-20 MG tablet [Pharmacy Med Name: AMLODIPINE -OLMESARTAN  5-20 MG] 90 tablet 1    Sig: TAKE 1 TABLET BY MOUTH EVERY DAY     Cardiovascular: CCB + ARB Combos Failed - 05/10/2024  2:07 PM      Failed - K in normal range and within 180 days    Potassium  Date Value Ref Range Status  10/04/2023 4.2 3.5 - 5.3 mmol/L Final  05/31/2013 3.8 3.5 - 5.1 mmol/L Final         Failed - Cr in normal range and within 180 days    Creat  Date Value Ref Range Status  10/04/2023 0.76 0.50 - 0.99 mg/dL Final         Failed - Na in normal range and within 180 days    Sodium  Date Value Ref Range Status  10/04/2023 140 135 - 146 mmol/L Final  05/31/2013 137 136 - 145 mmol/L Final         Failed - Valid encounter within last 6 months    Recent Outpatient Visits           7 months ago Mixed hyperlipidemia   [place] [facility] [place], [place] W, NP   8 years ago Sciatica of left side   [facility], [place], MD   8 years ago Recurrent acute suppurative otitis media without spontaneous rupture of left tympanic membrane   [facility], Juliane RAMAN, MD              Passed - Patient is not pregnant      Passed - Last BP in normal range    BP Readings from Last 1 Encounters:  01/24/24 123/85         Signed Prescriptions Disp Refills   atorvastatin  (LIPITOR) 10 MG tablet 90 tablet 0    Sig: TAKE 1 TABLET BY MOUTH EVERY DAY     Cardiovascular:  Antilipid - Statins Failed - 05/10/2024  2:07 PM      Failed - Lipid Panel in normal range within the last 12 months    Cholesterol  Date Value Ref  Range Status  10/04/2023 252 (H) <200 mg/dL Final   LDL Cholesterol (Calc)  Date Value Ref Range Status  10/04/2023 105 (H) mg/dL (calc) Final    Comment:    Reference range: <100 . Desirable range <100 mg/dL for primary prevention;   <70 mg/dL for patients with CHD or diabetic patients  with > or = 2 CHD risk factors. SABRA LDL-C is now calculated using the Martin-Hopkins  calculation, which is a validated novel method providing  better accuracy than the Friedewald equation in the  estimation of LDL-C.  Gladis APPLETHWAITE et al. SANDREA. 7986;689(80): 2061-2068  (http://education.[employer].com/faq/FAQ164)    Direct LDL  Date Value Ref Range Status  01/05/2018 89.0 mg/dL Final    Comment:    Optimal:  <100 mg/dLNear or Above Optimal:  100-129 mg/dLBorderline High:  130-159 mg/dLHigh:  160-189 mg/dLVery High:  >190 mg/dL   HDL  Date Value Ref Range Status  10/04/2023 116 > OR = 50 mg/dL Final   Triglycerides  Date Value Ref Range Status  10/04/2023 194 (H) <150 mg/dL Final         Passed - Patient is not pregnant      Passed - Valid encounter within last 12 months    Recent Outpatient Visits           7 months ago Mixed hyperlipidemia   [place] [facility] [place], [place] W, NP   8 years ago Sciatica of left side   [facility], [place], MD   8 years ago Recurrent acute suppurative otitis media without spontaneous rupture of left tympanic membrane   [facility], Juliane RAMAN, MD

## 2024-05-10 NOTE — Telephone Encounter (Signed)
 Requested Prescriptions  Pending Prescriptions Disp Refills   atorvastatin  (LIPITOR) 10 MG tablet [Pharmacy Med Name: ATORVASTATIN  10 MG TABLET] 90 tablet 0    Sig: TAKE 1 TABLET BY MOUTH EVERY DAY     Cardiovascular:  Antilipid - Statins Failed - 05/10/2024  2:07 PM      Failed - Lipid Panel in normal range within the last 12 months    Cholesterol  Date Value Ref Range Status  10/04/2023 252 (H) <200 mg/dL Final   LDL Cholesterol (Calc)  Date Value Ref Range Status  10/04/2023 105 (H) mg/dL (calc) Final    Comment:    Reference range: <100 . Desirable range <100 mg/dL for primary prevention;   <70 mg/dL for patients with CHD or diabetic patients  with > or = 2 CHD risk factors. SABRA LDL-C is now calculated using the Martin-Hopkins  calculation, which is a validated novel method providing  better accuracy than the Friedewald equation in the  estimation of LDL-C.  Gladis APPLETHWAITE et al. SANDREA. 7986;689(80): 2061-2068  (http://education.[facility].com/faq/FAQ164)    Direct LDL  Date Value Ref Range Status  01/05/2018 89.0 mg/dL Final    Comment:    Optimal:  <100 mg/dLNear or Above Optimal:  100-129 mg/dLBorderline High:  130-159 mg/dLHigh:  160-189 mg/dLVery High:  >190 mg/dL   HDL  Date Value Ref Range Status  10/04/2023 116 > OR = 50 mg/dL Final   Triglycerides  Date Value Ref Range Status  10/04/2023 194 (H) <150 mg/dL Final         Passed - Patient is not pregnant      Passed - Valid encounter within last 12 months    Recent Outpatient Visits           7 months ago Mixed hyperlipidemia   [place] [facility] [place], Angeline ORN, NP   8 years ago Sciatica of left side   [facility], [place], MD   8 years ago Recurrent acute suppurative otitis media without spontaneous rupture of left tympanic membrane   [facility], Juliane RAMAN, MD               amLODipine -olmesartan  (AZOR ) 5-20 MG tablet [Pharmacy Med  Name: AMLODIPINE -OLMESARTAN  5-20 MG] 90 tablet 1    Sig: TAKE 1 TABLET BY MOUTH EVERY DAY     Cardiovascular: CCB + ARB Combos Failed - 05/10/2024  2:07 PM      Failed - K in normal range and within 180 days    Potassium  Date Value Ref Range Status  10/04/2023 4.2 3.5 - 5.3 mmol/L Final  05/31/2013 3.8 3.5 - 5.1 mmol/L Final         Failed - Cr in normal range and within 180 days    Creat  Date Value Ref Range Status  10/04/2023 0.76 0.50 - 0.99 mg/dL Final         Failed - Na in normal range and within 180 days    Sodium  Date Value Ref Range Status  10/04/2023 140 135 - 146 mmol/L Final  05/31/2013 137 136 - 145 mmol/L Final         Failed - Valid encounter within last 6 months    Recent Outpatient Visits           7 months ago Mixed hyperlipidemia   [place] [facility], Angeline ORN, NP   8 years ago Sciatica of left side   [facility], [place], MD   8 years ago Recurrent acute suppurative otitis media without spontaneous rupture of left tympanic membrane   [facility], Juliane RAMAN, MD              Passed - Patient is not pregnant      Passed - Last BP in normal range    BP Readings from Last 1 Encounters:  01/24/24 123/85

## 2024-06-21 ENCOUNTER — Other Ambulatory Visit: Payer: Self-pay | Admitting: Internal Medicine

## 2024-06-21 DIAGNOSIS — G43109 Migraine with aura, not intractable, without status migrainosus: Secondary | ICD-10-CM

## 2024-06-22 NOTE — Telephone Encounter (Signed)
 Requested Prescriptions  Pending Prescriptions Disp Refills   SUMAtriptan  (IMITREX ) 100 MG tablet [Pharmacy Med Name: SUMATRIPTAN  SUCC 100 MG TABLET] 10 tablet 0    Sig: TAKE 1 TABLET EVERY 2 HOURS AS NEEDED FOR MIGRAINE MAY REPEAT IN 2 HRS IF HEADACHE PERSISTS     Neurology:  Migraine Therapy - Triptan Passed - 06/22/2024  3:46 PM      Passed - Last BP in normal range    BP Readings from Last 1 Encounters:  01/24/24 123/85         Passed - Valid encounter within last 12 months    Recent Outpatient Visits           8 months ago Mixed hyperlipidemia   [place] [facility] [place], [place] W, NP   8 years ago Sciatica of left side   [facility], [place], MD   8 years ago Recurrent acute suppurative otitis media without spontaneous rupture of left tympanic membrane   [facility], Juliane RAMAN, MD

## 2024-06-22 NOTE — Telephone Encounter (Signed)
 Discontinued 02/02/24.  Requested Prescriptions  Pending Prescriptions Disp Refills   ALPRAZolam  (XANAX ) 0.5 MG tablet [Pharmacy Med Name: ALPRAZOLAM  0.5 MG TABLET] 20 tablet 0    Sig: TAKE 1 TABLET BY MOUTH EVERY DAY AS NEEDED FOR ANXIETY     Not Delegated - Psychiatry: Anxiolytics/Hypnotics 2 Failed - 06/22/2024  3:45 PM      Failed - This refill cannot be delegated      Failed - Urine Drug Screen completed in last 360 days      Failed - Valid encounter within last 6 months    Recent Outpatient Visits           8 months ago Mixed hyperlipidemia   [place] [facility] [place], [place] W, NP   8 years ago Sciatica of left side   [facility], [place], MD   8 years ago Recurrent acute suppurative otitis media without spontaneous rupture of left tympanic membrane   [facility], Juliane RAMAN, MD              Passed - Patient is not pregnant

## 2024-07-10 ENCOUNTER — Telehealth: Admitting: Family Medicine

## 2024-07-10 ENCOUNTER — Telehealth: Admitting: Physician Assistant

## 2024-07-10 DIAGNOSIS — G43819 Other migraine, intractable, without status migrainosus: Secondary | ICD-10-CM

## 2024-07-10 DIAGNOSIS — J019 Acute sinusitis, unspecified: Secondary | ICD-10-CM

## 2024-07-10 DIAGNOSIS — B9789 Other viral agents as the cause of diseases classified elsewhere: Secondary | ICD-10-CM | POA: Diagnosis not present

## 2024-07-10 DIAGNOSIS — G43109 Migraine with aura, not intractable, without status migrainosus: Secondary | ICD-10-CM | POA: Diagnosis not present

## 2024-07-10 DIAGNOSIS — J329 Chronic sinusitis, unspecified: Secondary | ICD-10-CM | POA: Diagnosis not present

## 2024-07-10 MED ORDER — SUMATRIPTAN SUCCINATE 100 MG PO TABS: 100.0000 mg | ORAL_TABLET | ORAL | 0 refills | Status: AC | PRN

## 2024-07-10 MED ORDER — FLUTICASONE PROPIONATE 50 MCG/ACT NA SUSP: 2.0000 | Freq: Every day | NASAL | 6 refills | Status: AC

## 2024-07-10 NOTE — Progress Notes (Signed)
 We are sorry that you are not feeling well.  Here is how we plan to help!  Based on what you have shared with me it looks like you have sinusitis.  Sinusitis is inflammation and infection in the sinus cavities of the head.  Based on your presentation I believe you most likely have Acute Viral Sinusitis.This is an infection most likely caused by a virus. There is not specific treatment for viral sinusitis other than to help you with the symptoms until the infection runs its course.  You may use an oral decongestant such as Mucinex D or if you have glaucoma or high blood pressure use plain Mucinex. Saline nasal spray help and can safely be used as often as needed for congestion, I have prescribed: Fluticasone nasal spray two sprays in each nostril once a day  Some authorities believe that zinc sprays or the use of Echinacea may shorten the course of your symptoms.  Sinus infections are not as easily transmitted as other respiratory infection, however we still recommend that you avoid close contact with loved ones, especially the very young and elderly.  Remember to wash your hands thoroughly throughout the day as this is the number one way to prevent the spread of infection!  Home Care: Only take medications as instructed by your medical team. Do not take these medications with alcohol. A steam or ultrasonic humidifier can help congestion.  You can place a towel over your head and breathe in the steam from hot water  coming from a faucet. Avoid close contacts especially the very young and the elderly. Cover your mouth when you cough or sneeze. Always remember to wash your hands.  Get Help Right Away If: You develop worsening fever or sinus pain. You develop a severe head ache or visual changes. Your symptoms persist after you have completed your treatment plan.  Make sure you Understand these instructions. Will watch your condition. Will get help right away if you are not doing well or get  worse.  Your e-visit answers were reviewed by a board certified advanced clinical practitioner to complete your personal care plan.  Depending on the condition, your plan could have included both over the counter or prescription medications.  If there is a problem please reply  once you have received a response from your provider.  Your safety is important to us .  If you have drug allergies check your prescription carefully.    You can use MyChart to ask questions about today's visit, request a non-urgent call back, or ask for a work or school excuse for 24 hours related to this e-Visit. If it has been greater than 24 hours you will need to follow up with your provider, or enter a new e-Visit to address those concerns.  You will get an e-mail in the next two days asking about your experience.  I hope that your e-visit has been valuable and will speed your recovery. Thank you for using e-visits.  I have spent 5 minutes in review of e-visit questionnaire, review and updating patient chart, medical decision making and response to patient.   Damani Kelemen, FNP

## 2024-07-10 NOTE — Progress Notes (Signed)
" ° °  Thank you for the details you included in the comment boxes. Those details are very helpful in determining the best course of treatment for you and help us  to provide the best care.Because of severe migraine with this, which we cannot assess or treat via e-visit, we recommend that you schedule a Virtual Urgent Care video visit in order for the provider to better assess what is going on.  The provider will be able to give you a more accurate diagnosis and treatment plan if we can more freely discuss your symptoms and with the addition of a virtual examination.   If you change your visit to a video visit, we will bill your insurance (similar to an office visit) and you will not be charged for this e-Visit. You will be able to stay at home and speak with the first available [facility] advanced practice provider. The link to do a video visit is in the drop down Menu tab of your Welcome screen in MyChart.    "

## 2024-07-20 ENCOUNTER — Telehealth: Admitting: Physician Assistant

## 2024-07-20 DIAGNOSIS — T753XXA Motion sickness, initial encounter: Secondary | ICD-10-CM | POA: Diagnosis not present

## 2024-07-20 MED ORDER — SCOPOLAMINE 1 MG/3DAYS TD PT72: 1.0000 | MEDICATED_PATCH | TRANSDERMAL | 0 refills | Status: AC

## 2024-07-20 NOTE — Progress Notes (Signed)
 E Visit for Motion Sickness  We are sorry that you are not feeling well. Here is how we plan to help!  Based on what you have shared with me it looks like you have symptoms of motion sickness.  I have prescribed a medication that will help prevent or alleviate your symptoms:  Scopolamine  Transdermal 1 mg patch behind ear at least 4 hours prior to travel (preferably 12 hours)   Prevention:  You might feel better if you keep your eyes focused on outside while you are in motion. For example, if you are in a car, sit in the front and look in the direction you are moving; if you are on a boat, stay on the deck and look to the horizon. This helps make what you see match the movement you are feeling, and so you are less likely to feel sick.  You should also avoid reading, watching a movie, texting or reading messages, or looking at things close to you inside the vehicle you are riding in.  Use the seat head rest. Lean your head against the back of the seat or head rest when traveling in vehicles with seats to minimize head movements.  On a ship: When making your reservations, choose a cabin in the middle of the ship and near the waterline. When on board, go up on deck and focus on the horizon.  In an airplane: Request a window seat and look out the window. A seat over the front edge of the wing is the most preferable spot (the degree of motion is the lowest here). Direct the air vent to blow cool air on your face.  On a train: Always face forward and sit near a window.  In a vehicle: Sit in the front seat; if you are the passenger, look at the scenery in the distance. For some people, driving the vehicle (rather than being a passenger) is an instant remedy.  Avoid others who have become nauseous with motion sickness. Seeing and smelling others who have motion sickness may cause you to become sick.  GET HELP RIGHT AWAY IF:  Your symptoms do not improve or worsen within 2 days after  treatment.  You cannot keep down fluids after trying the medication.  Other associated symptoms such as severe headache, visual field changes, fever, or intractable nausea and vomiting.  MAKE SURE YOU:  Understand these instructions. Will watch your condition. Will get help right away if you are not doing well or get worse.  Thank you for choosing an e-visit.  Your e-visit answers were reviewed by a board certified advanced clinical practitioner to complete your personal care plan. Depending upon the condition, your plan could have included both over the counter or prescription medications.  Please review your pharmacy choice. Be sure that the pharmacy you have chosen is open so that you can pick up your prescription now.  If there is a problem you may message your provider in MyChart to have the prescription routed to another pharmacy.  Your safety is important to us . If you have drug allergies check your prescription carefully.   For the next 24 hours, you can use MyChart to ask questions about today's visit, request a non-urgent call back, or ask for a work or school excuse from your e-visit provider.  You will get an e-mail in the next two days asking about your experience. I hope that your e-visit has been valuable and will speed your recovery.   References or for more  information: https://[email or web address]/ https://my.https://rowe.info/ https://www.uptodate.com   I have spent 5 minutes in review of e-visit questionnaire, review and updating patient chart, medical decision making and response to patient.   Delon CHRISTELLA Dickinson, PA-C
